# Patient Record
Sex: Female | Born: 1960
Health system: Southern US, Community
[De-identification: ages and names within clinical notes are randomized; demographics above are authoritative.]

## PROBLEM LIST (undated history)

## (undated) DIAGNOSIS — I639 Cerebral infarction, unspecified: Secondary | ICD-10-CM

## (undated) DIAGNOSIS — C801 Malignant (primary) neoplasm, unspecified: Secondary | ICD-10-CM

## (undated) DIAGNOSIS — I890 Lymphedema, not elsewhere classified: Secondary | ICD-10-CM

## (undated) DIAGNOSIS — J40 Bronchitis, not specified as acute or chronic: Secondary | ICD-10-CM

## (undated) DIAGNOSIS — I10 Essential (primary) hypertension: Secondary | ICD-10-CM

## (undated) DIAGNOSIS — K760 Fatty (change of) liver, not elsewhere classified: Secondary | ICD-10-CM

## (undated) DIAGNOSIS — I5032 Chronic diastolic (congestive) heart failure: Secondary | ICD-10-CM

## (undated) HISTORY — PX: REDUCTION MAMMAPLASTY: SUR839

## (undated) HISTORY — PX: ABDOMINOPLASTY/PANNICULECTOMY: SHX5578

## (undated) HISTORY — DX: Malignant (primary) neoplasm, unspecified: C80.1

---

## 1996-09-13 HISTORY — PX: BREAST SURGERY: SHX581

## 2002-09-13 DIAGNOSIS — I639 Cerebral infarction, unspecified: Secondary | ICD-10-CM

## 2002-09-13 HISTORY — DX: Cerebral infarction, unspecified: I63.9

## 2013-08-20 ENCOUNTER — Other Ambulatory Visit: Payer: Self-pay | Admitting: Emergency Medicine

## 2013-08-20 DIAGNOSIS — Z1231 Encounter for screening mammogram for malignant neoplasm of breast: Secondary | ICD-10-CM

## 2013-09-13 HISTORY — PX: MASTECTOMY: SHX3

## 2013-09-26 ENCOUNTER — Ambulatory Visit: Payer: Self-pay

## 2013-09-26 ENCOUNTER — Ambulatory Visit
Admission: RE | Admit: 2013-09-26 | Discharge: 2013-09-26 | Disposition: A | Discharge status: Home / Self Care | Payer: Federal, State, Local not specified - PPO | Source: Ambulatory Visit | Attending: Emergency Medicine | Admitting: Emergency Medicine

## 2013-09-26 DIAGNOSIS — Z1231 Encounter for screening mammogram for malignant neoplasm of breast: Secondary | ICD-10-CM

## 2013-10-02 ENCOUNTER — Other Ambulatory Visit: Payer: Self-pay | Admitting: Emergency Medicine

## 2013-10-02 ENCOUNTER — Ambulatory Visit
Admission: RE | Admit: 2013-10-02 | Discharge: 2013-10-02 | Disposition: A | Discharge status: Home / Self Care | Payer: Federal, State, Local not specified - PPO | Source: Ambulatory Visit | Attending: Emergency Medicine | Admitting: Emergency Medicine

## 2013-10-02 DIAGNOSIS — N6489 Other specified disorders of breast: Secondary | ICD-10-CM

## 2013-10-02 DIAGNOSIS — Z853 Personal history of malignant neoplasm of breast: Secondary | ICD-10-CM

## 2013-10-02 DIAGNOSIS — Z1231 Encounter for screening mammogram for malignant neoplasm of breast: Secondary | ICD-10-CM

## 2013-10-02 DIAGNOSIS — R928 Other abnormal and inconclusive findings on diagnostic imaging of breast: Secondary | ICD-10-CM

## 2013-10-03 ENCOUNTER — Ambulatory Visit (INDEPENDENT_AMBULATORY_CARE_PROVIDER_SITE_OTHER): Payer: Federal, State, Local not specified - PPO

## 2013-10-03 VITALS — BP 155/83 | HR 60 | Resp 12 | Ht 67.0 in | Wt 170.0 lb

## 2013-10-03 DIAGNOSIS — R52 Pain, unspecified: Secondary | ICD-10-CM

## 2013-10-03 DIAGNOSIS — M2010 Hallux valgus (acquired), unspecified foot: Secondary | ICD-10-CM

## 2013-10-03 DIAGNOSIS — M779 Enthesopathy, unspecified: Secondary | ICD-10-CM

## 2013-10-03 NOTE — Progress Notes (Signed)
   Subjective:    Patient ID: Kelsey Mclaughlin, female    DOB: 1961-06-19, 53 y.o.   MRN: [id number]  HPI Comments: BOTH OF FEET AND BUNION ARE HURTING FOR 10 YEARS AND TREATMENT.''  bunion left foot is more severe painful than the right there is some slight crepitus noted on the left side. Patient had bunion pain for more than 10 years has tried changing shoes padding cushioning. Patient is currently wearing a worn pair of [employer]verse ulcer or tendon shoes. Providing little or no support. Patient does speak [place] however has [place] and [place] and we're able to do much of the visit speaking [place]    Review of Systems  All other systems reviewed and are negative.       Objective:   Physical Exam Neurologically epicritic and proprioceptive sensations intact and symmetric bilateral normal plantar response DTRs not elicited. Vascular status is intact with pedal pulses palpable DP and PT +2/4 bilateral Refill time 3 seconds all digits skin temperature warm turgor normal no edema rubor pallor or varicosities noted. Neurologically skin color pigment normal hair growth present but diminished distally nail somewhat criptotic there is prominence of the first MP. Bilateral left more so than right. Orthopedic biomechanical exam there is crepitus on range of motion the left great toe joint. X-rays AP lateral oblique views taken this time reveal elevated I am space proximally 1343 Ibilateral hallux abductus 20-25 bilateral sesamoid position 6-7 bilateral left more so than right some slight asymmetric joint space narrowing was noted. Patient had functional hallux limitus noted on clinical evaluation due to long first metatarsal. Capsulitis first MTP area in symptomology left more so than right       Assessment & Plan:  Assessment this time is HAV deformity bilateral left more so than right with associated capsulitis and possibly early arthritic change. Per patient request my recommendation at  this time patient is a candidate for surgical intervention explained and [place] bunionectomy with pin or screw fixation to the patient we discussed this both [place] and in [place] At this time consent form was reviewed and signed for [employer] with pin or screw fixation all questions asked medication are answered. Literature about surgery and surgery procedure in surgery Center are dispensed for patient for her review of surgery scheduled her convenience first left foot and eventually the right foot after 3 month healing process. Patient understands she'll be in a Cam Walker boot for 6 week 5-6 week duration. Patient will be weightbearing postop with the assistance of a cane if needed surgery done under IV sedation and regional or local anesthetic block. Surgery scheduled at this time  Harriet Masson DPM

## 2013-10-03 NOTE — Patient Instructions (Signed)
Pre-Operative Instructions  Congratulations, you have decided to take an important step to improving your quality of life.  You can be assured that the doctors of [facility] will be with you every step of the way.  1. Plan to be at the surgery center/hospital at least 1 (one) hour prior to your scheduled time unless otherwise directed by the surgical center/hospital staff.  You must have a responsible adult accompany you, remain during the surgery and drive you home.  Make sure you have directions to the surgical center/hospital and know how to get there on time. 2. For hospital based surgery you will need to obtain a history and physical form from your family physician within 1 month prior to the date of surgery- we will give you a form for you primary physician.  3. We make every effort to accommodate the date you request for surgery.  There are however, times where surgery dates or times have to be moved.  We will contact you as soon as possible if a change in schedule is required.   4. No Aspirin/Ibuprofen for one week before surgery.  If you are on aspirin, any non-steroidal anti-inflammatory medications (Mobic, Aleve, Ibuprofen) you should stop taking it 7 days prior to your surgery.  You make take Tylenol  For pain prior to surgery.  5. Medications- If you are taking daily heart and blood pressure medications, seizure, reflux, allergy, asthma, anxiety, pain or diabetes medications, make sure the surgery center/hospital is aware before the day of surgery so they may notify you which medications to take or avoid the day of surgery. 6. No food or drink after midnight the night before surgery unless directed otherwise by surgical center/hospital staff. 7. No alcoholic beverages 24 hours prior to surgery.  No smoking 24 hours prior to or 24 hours after surgery. 8. Wear loose pants or shorts- loose enough to fit over bandages, boots, and casts. 9. No slip on shoes, sneakers are best. 10. Bring  your boot with you to the surgery center/hospital.  Also bring crutches or a walker if your physician has prescribed it for you.  If you do not have this equipment, it will be provided for you after surgery. 11. If you have not been contracted by the surgery center/hospital by the day before your surgery, call to confirm the date and time of your surgery. 12. Leave-time from work may vary depending on the type of surgery you have.  Appropriate arrangements should be made prior to surgery with your employer. 13. Prescriptions will be provided immediately following surgery by your doctor.  Have these filled as soon as possible after surgery and take the medication as directed. 14. Remove nail polish on the operative foot. 15. Wash the night before surgery.  The night before surgery wash the foot and leg well with the antibacterial soap provided and water paying special attention to beneath the toenails and in between the toes.  Rinse thoroughly with water and dry well with a towel.  Perform this wash unless told not to do so by your physician.  Enclosed: 1 Ice pack (please put in freezer the night before surgery)   1 Hibiclens skin cleaner use any liquid antibacterial soap   Pre-op Instructions  If you have any questions regarding the instructions, do not hesitate to call our office.  : [address], [place] [address] [place]: [phone], [place], [place] [address] [phone]  [place]: [place], [place]  [address] [phone]  Dr. Kendell Bane DPM, Dr. Ila Mcgill DPM Dr. Harriet Masson DPM, Dr. Lanelle Bal DPM, Dr. Trudie Buckler DPM

## 2013-10-04 ENCOUNTER — Ambulatory Visit
Admission: RE | Admit: 2013-10-04 | Discharge: 2013-10-04 | Disposition: A | Discharge status: Home / Self Care | Payer: Federal, State, Local not specified - PPO | Source: Ambulatory Visit | Attending: Emergency Medicine | Admitting: Emergency Medicine

## 2013-10-04 DIAGNOSIS — Z853 Personal history of malignant neoplasm of breast: Secondary | ICD-10-CM

## 2013-10-04 DIAGNOSIS — N6489 Other specified disorders of breast: Secondary | ICD-10-CM

## 2013-10-04 DIAGNOSIS — R928 Other abnormal and inconclusive findings on diagnostic imaging of breast: Secondary | ICD-10-CM

## 2013-10-05 ENCOUNTER — Other Ambulatory Visit: Payer: Self-pay | Admitting: Emergency Medicine

## 2013-10-05 DIAGNOSIS — D0510 Intraductal carcinoma in situ of unspecified breast: Secondary | ICD-10-CM

## 2013-10-11 ENCOUNTER — Telehealth: Payer: Self-pay | Admitting: *Deleted

## 2013-10-11 NOTE — Telephone Encounter (Signed)
Cancelled pt surgery, states pt has breast cancer and is treating this 1st.

## 2013-10-12 ENCOUNTER — Encounter (INDEPENDENT_AMBULATORY_CARE_PROVIDER_SITE_OTHER): Payer: Self-pay

## 2013-10-12 ENCOUNTER — Other Ambulatory Visit (INDEPENDENT_AMBULATORY_CARE_PROVIDER_SITE_OTHER): Payer: Self-pay | Admitting: Surgery

## 2013-10-12 ENCOUNTER — Telehealth: Payer: Self-pay | Admitting: *Deleted

## 2013-10-12 ENCOUNTER — Encounter (INDEPENDENT_AMBULATORY_CARE_PROVIDER_SITE_OTHER): Payer: Federal, State, Local not specified - PPO | Admitting: General Surgery

## 2013-10-12 ENCOUNTER — Ambulatory Visit
Admission: RE | Admit: 2013-10-12 | Discharge: 2013-10-12 | Disposition: A | Discharge status: Home / Self Care | Payer: Federal, State, Local not specified - PPO | Source: Ambulatory Visit | Attending: Emergency Medicine | Admitting: Emergency Medicine

## 2013-10-12 ENCOUNTER — Ambulatory Visit (INDEPENDENT_AMBULATORY_CARE_PROVIDER_SITE_OTHER): Payer: Federal, State, Local not specified - PPO | Admitting: Surgery

## 2013-10-12 ENCOUNTER — Encounter (INDEPENDENT_AMBULATORY_CARE_PROVIDER_SITE_OTHER): Payer: Self-pay | Admitting: Surgery

## 2013-10-12 VITALS — BP 110/90 | HR 92 | Temp 97.9°F | Resp 15 | Ht 67.0 in | Wt 177.8 lb

## 2013-10-12 DIAGNOSIS — C50419 Malignant neoplasm of upper-outer quadrant of unspecified female breast: Secondary | ICD-10-CM

## 2013-10-12 DIAGNOSIS — D0510 Intraductal carcinoma in situ of unspecified breast: Secondary | ICD-10-CM

## 2013-10-12 DIAGNOSIS — C50412 Malignant neoplasm of upper-outer quadrant of left female breast: Secondary | ICD-10-CM

## 2013-10-12 DIAGNOSIS — C50912 Malignant neoplasm of unspecified site of left female breast: Secondary | ICD-10-CM

## 2013-10-12 DIAGNOSIS — Z170 Estrogen receptor positive status [ER+]: Secondary | ICD-10-CM | POA: Insufficient documentation

## 2013-10-12 NOTE — Patient Instructions (Signed)
Plastic Surgery referral to discuss possible immediate reconstruction Genetic Counseling to discuss possible genetic testing Oncology referral to discuss possible chemotherapy after surgery

## 2013-10-12 NOTE — Progress Notes (Addendum)
Patient ID: Kelsey Mclaughlin, female   DOB: 09/11/1961, 53 y.o.   MRN: [id number]  Chief Complaint  Patient presents with  . New Evaluation    eval Lt br cancer    HPI Kelsey Mclaughlin is a 53 y.o. female.  Referred by Dr. Ormond, [facility]  for recurrent left breast cancer HPI This is a 53-year-old female who presents with recent diagnosis of recurrent left breast cancer. The patient was originally diagnosed at age 35 with invasive ductal carcinoma in the left upper outer quadrant near the axilla. The patient was in [place] at that time. According to the reports from the patient and her wife, as well as some of the notes that they brought with them from [place], She had a lumpectomy and full axillary lymph node dissection on the left in 1998. She underwent chemotherapy as well as radiation postoperatively. I cannot determine from the notes in [place] what her chemotherapeutic regimen involved. I can determine that she had 2/26 lymph nodes positive on her pathology report. The original tumor was infiltrating ductal carcinoma.  Over the last 4 years she has felt a small lump in the upper-outer quadrant of her left breast. She had mammograms and ultrasounds performed of this area but no biopsies were ever performed. Recently she moved to  To [place]. She underwent screening mammogram in December and was brought back for a diagnostic left mammogram for left breast asymmetry on 10/02/13.  At 1:00 in the left breast about 2 cm from the nipple there is an irregular hypoechoic mass measuring 1 x 0.4 x 1.7 cm. 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 ER and PR are positive Ki-67 15% MRI was performed this morning and is still pending. Past Medical History  Diagnosis Date  . Cancer     breast  left breast - infiltrating ductal with lumpectomy/ ALND (2/26 LN positive) followed by chemotherapy/  radiation - 1998 Hypertension  Past Surgical History  Procedure Laterality Date  . Breast surgery  1998    lumpectomy on left and removed lymphnodes in [place]    Family History  Problem Relation Age of Onset  . Cancer Mother     breast  . Heart disease Father   . Cancer Maternal Grandmother     breast  She has one sister  Social History History  Substance Use Topics  . Smoking status: Never Smoker   . Smokeless tobacco: Never Used  . Alcohol Use: No    No Known Allergies  Current Outpatient Prescriptions  Medication Sig Dispense Refill  . clarithromycin (BIAXIN) 500 MG tablet       . promethazine-dextromethorphan (PROMETHAZINE-DM) 6.25-15 MG/5ML syrup       . telmisartan-hydrochlorothiazide (MICARDIS HCT) 80-12.5 MG per tablet        No current facility-administered medications for this visit.    Review of Systems Review of Systems  Constitutional: Negative for fever, chills and unexpected weight change.  HENT: Negative for congestion, hearing loss, sore throat, trouble swallowing and voice change.   Eyes: Negative for visual disturbance.  Respiratory: Negative for cough and wheezing.   Cardiovascular: Negative for chest pain, palpitations and leg swelling.  Gastrointestinal: Negative for nausea, vomiting, abdominal pain, diarrhea, constipation, blood in stool, abdominal distention and anal bleeding.  Genitourinary: Negative for hematuria, vaginal bleeding and difficulty urinating.  Musculoskeletal: Negative for arthralgias.  Skin: Negative for rash and wound.  Neurological: Negative for seizures,   syncope and headaches.  Hematological: Negative for adenopathy. Does not bruise/bleed easily.  Psychiatric/Behavioral: Negative for confusion.   Menarche - age 84 First pregnancy - age 4 Breastfeeding - yes Menopause - age 53 FH - mother died at age 91 from breast cancer MGM died at age 43 and had breast cancer  Blood pressure 110/90, pulse 92, temperature 97.9  F (36.6 C), temperature source Oral, resp. rate 15, height 5' 7" (1.702 m), weight 177 lb 12.8 oz (80.65 kg).  Physical Exam Physical Exam WDWN in NAD HEENT:  EOMI, sclera anicteric Neck:  No masses, no thyromegaly Lungs:  CTA bilaterally; normal respiratory effort Breasts:  Right breast - no palpable masses; no nipple retraction or discharge; no axillary lymphadenopathy.  She has an area behind the right areola at 2:00 that is mildly tender to palpation.  She indicates that she occasionally gets sharp pain in this area, but no masses are palpated.  Left breast - large scar in left axilla running down onto the breast; firm palpable mass at 2:00 about 2 cm outside the areola; no nipple retraction or discharge CV:  Regular rate and rhythm; no murmurs Abd:  +bowel sounds, soft, non-tender, no masses Ext:  Well-perfused; no edema; left arm with some mild lymphedema Skin:  Warm, dry; no sign of jaundice  Data Reviewed Mm Digital Diagnostic Unilat L  10/04/2013   CLINICAL DATA:  Ultrasound-guided left breast biopsy with clip placement.  EXAM: POST-BIOPSY CLIP PLACEMENT LEFT DIAGNOSTIC MAMMOGRAM  COMPARISON:  Previous exams.  FINDINGS: Films are performed following ultrasound guided biopsy of a left breast hypoechoic mass. Clip lies within the mass adjacent to a dystrophic calcification.  IMPRESSION: Post biopsy clip mammogram show the biopsy, ribbon shaped, clip to be well-positioned.  Final Assessment: Post Procedure Mammograms for Marker Placement   Electronically Signed   By: Lajean Manes M.D.   On: 10/04/2013 16:29   Mm Digital Diagnostic Unilat L  10/02/2013   CLINICAL DATA:  Screening recall for a left breast asymmetry.  EXAM: DIGITAL DIAGNOSTIC  LEFT MAMMOGRAM WITH CAD  ULTRASOUND LEFT BREAST  COMPARISON:  Previous exams.  ACR Breast Density Category c: The breast tissue is heterogeneously dense, which may obscure small masses.  FINDINGS: There are persistent asymmetry/possible masses in the  upper-outer left breast at the approximate 1 o'clock position confirmed on the additional CC and MLO spot compression views. Mammographic images were processed with CAD.  Physical examination of the upper-outer left breast reveals a postsurgical scar extending into the left axilla at the approximate 1 o'clock position. There is a firm nodular area also at 1 o'clock 2-3 cm from the nipple which the patient states has been present for a while although is now mildly tender.  Targeted ultrasound of the left breast was performed demonstrating irregular hypoechoic masses at 1 o'clock 2-3 cm from the nipple, one of which appears to extend towards the nipple and measures 1 x 0.4 x 1.7 cm. No lymphadenopathy is seen in the left axilla.  IMPRESSION: Suspicious left breast mass. While this may be related to postsurgical/posttreatment change (scar/fat necrosis), the findings remain indeterminate and therefore biopsy is recommended.  RECOMMENDATION: Ultrasound-guided biopsy of the mass in the left breast at 1 o'clock is recommended. This is scheduled for 10/04/2013 at 2 p.m.  I have discussed the findings and recommendations with the patient. Results were also provided in writing at the conclusion of the visit.  BI-RADS CATEGORY  4: Suspicious abnormality - biopsy should be considered.  Electronically Signed   By: Everlean Alstrom M.D.   On: 10/02/2013 15:16   US Breast Ltd Uni Left Inc Axilla  10/02/2013   CLINICAL DATA:  Screening recall for a left breast asymmetry.  EXAM: DIGITAL DIAGNOSTIC  LEFT MAMMOGRAM WITH CAD  ULTRASOUND LEFT BREAST  COMPARISON:  Previous exams.  ACR Breast Density Category c: The breast tissue is heterogeneously dense, which may obscure small masses.  FINDINGS: There are persistent asymmetry/possible masses in the upper-outer left breast at the approximate 1 o'clock position confirmed on the additional CC and MLO spot compression views. Mammographic images were processed with CAD.  Physical  examination of the upper-outer left breast reveals a postsurgical scar extending into the left axilla at the approximate 1 o'clock position. There is a firm nodular area also at 1 o'clock 2-3 cm from the nipple which the patient states has been present for a while although is now mildly tender.  Targeted ultrasound of the left breast was performed demonstrating irregular hypoechoic masses at 1 o'clock 2-3 cm from the nipple, one of which appears to extend towards the nipple and measures 1 x 0.4 x 1.7 cm. No lymphadenopathy is seen in the left axilla.  IMPRESSION: Suspicious left breast mass. While this may be related to postsurgical/posttreatment change (scar/fat necrosis), the findings remain indeterminate and therefore biopsy is recommended.  RECOMMENDATION: Ultrasound-guided biopsy of the mass in the left breast at 1 o'clock is recommended. This is scheduled for 10/04/2013 at 2 p.m.  I have discussed the findings and recommendations with the patient. Results were also provided in writing at the conclusion of the visit.  BI-RADS CATEGORY  4: Suspicious abnormality - biopsy should be considered.   Electronically Signed   By: Everlean Alstrom M.D.   On: 10/02/2013 15:16   Mm Screening Breast Tomo Bilateral  10/05/2013   ADDENDUM REPORT: 10/05/2013 12:53  ADDENDUM: ACR breast Density Category b: There are scattered areas of fibroglandular density.   Electronically Signed   By: Everlean Alstrom M.D.   On: 10/05/2013 12:53   10/05/2013   CLINICAL DATA:  Screening.  EXAM: DIGITAL SCREENING BILATERAL MAMMOGRAM WITH 3D TOMO WITH CAD  COMPARISON:  Previous exam(s).  ACR Breast Density Category  FINDINGS: In the left breast, a possible asymmetry warrants further evaluation with spot compression views and possibly ultrasound. In the right breast, no suspicious masses or malignant type calcifications are identified. Images were processed with CAD.  IMPRESSION: Further evaluation is suggested for possible asymmetry in the  left breast.  RECOMMENDATION: Diagnostic mammogram and possibly ultrasound of the left breast. (Code:FI-L-57M)  The patient will be contacted regarding the findings, and additional imaging will be scheduled.  BI-RADS CATEGORY  0: Incomplete. Need additional imaging evaluation and/or prior mammograms for comparison.  Electronically Signed: By: Everlean Alstrom M.D. On: 10/02/2013 14:13   [place] Lt Breast Bx W Loc Dev 1st Lesion Img Bx Spec US Guide  10/05/2013   ADDENDUM REPORT: 10/05/2013 10:27  ADDENDUM: PATHOLOGY ADDENDUM:  Pathology left breast biopsy:  Breast, left, needle core biopsy, mass, 1 o'clock, 2 cm fn  - INVASIVE DUCTAL CARCINOMA WITH EXTRACELLULAR MUCIN.  - DUCTAL CARCINOMA IN SITU.  Pathology concordance with imaging findings: Yes  Recommendation: Surgical consultation regarding treatment plan  At the request of the patient, I spoke with her spouse, [name], by telephone on 10/05/2013 at 10:24. She reports that the patient has done well after the biopsy .  Surgical consultation has been arranged for the patient with Dr.  Priyal Musquiz on 10/12/2013. MRI will be arranged.   Electronically Signed   By: Shon Hale M.D.   On: 10/05/2013 10:27   10/05/2013   CLINICAL DATA:  Hypoechoic mass in the left breast at 1 o'clock, 2 cm from the nipple.  EXAM: ULTRASOUND GUIDED LEFT BREAST CORE NEEDLE BIOPSY WITH VACUUM ASSIST  COMPARISON:  Previous exams.  PROCEDURE: I met with the patient and we discussed the procedure of ultrasound-guided biopsy, including benefits and alternatives. We discussed the high likelihood of a successful procedure. We discussed the risks of the procedure including infection, bleeding, tissue injury, clip migration, and inadequate sampling. Informed written consent was given. The usual time-out protocol was performed immediately prior to the procedure.  Using sterile technique and 2% Lidocaine as local anesthetic, under direct ultrasound visualization, a 14 gauge vacuum-assisteddevice was used to  perform biopsy of the left breast mass using a medial to lateral approach. At the conclusion of the procedure, a ribbon tissue marker clip was deployed into the biopsy cavity. Follow-up 2-view mammogram was performed and dictated separately.  IMPRESSION: Ultrasound-guided biopsy of a left breast mass. No apparent complications.  Electronically Signed: By: Lajean Manes M.D. On: 10/04/2013 15:33   We have copies of the notes from [place], which will be scanned into EPIC.  We will attempt to find somebody to translate these so we can determine her previous treatment regimen.   Assessment    Recurrent left breast cancer - infiltrating ductal with DCIS;  ER/PR +;     Plan    The patient and her wife have discussed this prior to our visit today. She wants to pursue bilateral mastectomies. Certainly, I think that she would definitely need a left mastectomy as she has had previous axillary lymph node dissection and radiation. She would not be a candidate for breast conserving therapy on the left. With her strong family history, I would recommend referral for genetic counseling and testing. They both feel strongly that they want to pursue a right prophylactic mastectomy. We discussed the possibility of immediate reconstruction versus delayed reconstruction. We will refer her to plastic surgery for discussion and decision on the timing of reconstruction. We will present her case at breast cancer conference next week.  We will schedule her surgery after she has seen plastic surgery      Total discussion time - 75 minutes  Delon Revelo K. 10/12/2013, 12:38 PM

## 2013-10-12 NOTE — Telephone Encounter (Signed)
Received referral in the workque for Med Onc and Genetics.  There is no notes on how this pt needs to be scheduled for either appt.  I emailed Annie at [employer] with an update on how to schedule this pt.

## 2013-10-15 ENCOUNTER — Telehealth: Payer: Self-pay | Admitting: *Deleted

## 2013-10-15 NOTE — Telephone Encounter (Signed)
Spoke to [place]- pt spouse.  Gave navigation resources and contact information.  No further needs voiced at this time.

## 2013-10-15 NOTE — Telephone Encounter (Signed)
Called and spoke with pt's spouse [name]) and confirmed 10/16/13 genetic appt.  Unable to schedule Karen's appt - emailed Audie Clear to open template so I can.  Confirmed 10/18/13 med onc appt w/ her as well.  Could not schedule Dr. Laurelyn Sickle appt - emailed Audie Clear to enter it for me.  Gave phone to [employer] (the navigator) and she confirmed they had a bag and the paperwork.  I placed a note in EPIC for FA to verify that she has the Intake form and if they don't have with them for one to be given at time of check in.  Dr. Humphrey Rolls requested for an interpretor to be present for this visit, so I went and change the no to a yes, so one will be here.  Emailed Santiago Glad P. To make her aware.  Emailed Annie at [employer] to make her aware also.  Took paperwork to Med Rec for chart.

## 2013-10-16 ENCOUNTER — Encounter: Payer: Self-pay | Admitting: Genetic Counselor

## 2013-10-16 ENCOUNTER — Other Ambulatory Visit: Payer: Federal, State, Local not specified - PPO

## 2013-10-16 ENCOUNTER — Ambulatory Visit (HOSPITAL_BASED_OUTPATIENT_CLINIC_OR_DEPARTMENT_OTHER): Payer: Federal, State, Local not specified - PPO | Admitting: Genetic Counselor

## 2013-10-16 DIAGNOSIS — C50919 Malignant neoplasm of unspecified site of unspecified female breast: Secondary | ICD-10-CM

## 2013-10-16 DIAGNOSIS — Z853 Personal history of malignant neoplasm of breast: Secondary | ICD-10-CM

## 2013-10-16 DIAGNOSIS — Z803 Family history of malignant neoplasm of breast: Secondary | ICD-10-CM

## 2013-10-16 DIAGNOSIS — IMO0002 Reserved for concepts with insufficient information to code with codable children: Secondary | ICD-10-CM

## 2013-10-16 NOTE — Progress Notes (Signed)
Dr.  Jaymes Graff requested a consultation for genetic counseling and risk assessment for Kelsey Mclaughlin, a 53 y.o. female, for discussion of her personal history of breast cancer and family history of breast cancer.  She presents to clinic today to discuss the possibility of a genetic predisposition to cancer, and to further clarify her risks, as well as her family members' risks for cancer.   HISTORY OF PRESENT ILLNESS: In 1998, at the age of 48, Kelsey Mclaughlin was diagnosed with invasive ductal carcinoma of the left breast. This was treated with lumpectomy, chemotherapy and radiation. In 2015, at the age of 40, she was diagnosed with, we think, a recurrance of this original cancer.  The tumor is triple positive.  Pending her genetic testing will determine the surgery for her cancer.  She has not had a colonoscopy yet. She did not have genetic testing at the time of her original cancer.  The patient is originally from [place] and [place], and all of her previous treatment was in [place].  She does not speak [place].   Past Medical History  Diagnosis Date  . Cancer 1998, 2015    breast    Past Surgical History  Procedure Laterality Date  . Breast surgery  1998    lumpectomy on left and removed lymphnodes in [place]    History   Social History  . Marital Status: Married    Spouse Name: N/A    Number of Children: 2  . Years of Education: N/A   Social History Main Topics  . Smoking status: Never Smoker   . Smokeless tobacco: Never Used  . Alcohol Use: Yes     Comment: 2 drinks/day  . Drug Use: No  . Sexual Activity: None   Other Topics Concern  . None   Social History Narrative  . None    REPRODUCTIVE HISTORY AND PERSONAL RISK ASSESSMENT FACTORS: Menarche was at age 66.   postmenopausal Uterus Intact: yes Ovaries Intact: yes G2P2A0, first live birth at age 20  She has not previously undergone treatment for infertility.   Oral Contraceptive use: 0 years    She has not used HRT in the past.    FAMILY HISTORY:  We obtained a detailed, 4-generation family history.  Significant diagnoses are listed below: Family History  Problem Relation Age of Onset  . Breast cancer Mother 32  . Heart disease Father   . Heart attack Father   . Breast cancer Maternal Grandmother 2  . Brain cancer Maternal Uncle 74    benign brain tumor  . Heart attack Paternal Aunt     Patient's maternal ancestors are of [place] descent, and paternal ancestors are of [place] descent. There is possible reported Ashkenazi Jewish ancestry. There is no known consanguinity.  GENETIC COUNSELING ASSESSMENT: Kelsey Mclaughlin is a 53 y.o. female with a personal history of breast cancer and family history of breast cancer (on the the non Jewish side of the family) which somewhat suggestive of a hereditary cancer syndrome and predisposition to cancer. We, therefore, discussed and recommended the following at today's visit.   DISCUSSION: We reviewed the characteristics, features and inheritance patterns of hereditary cancer syndromes. We also discussed genetic testing, including the appropriate family members to test, the process of testing, insurance coverage and turn-around-time for results. We discussed BRCA mutations as the most common reason why there is an early onset of breast cancer.  We also discussed an increased risk for TP53 mutations in individuals with breast cancer diagnosed  35 and under.   PLAN: After considering the risks, benefits, and limitations, Kelsey Mclaughlin provided informed consent to pursue genetic testing and the blood sample will be sent to [facility] for analysis of the Breast/Ovarian cancer panel. We discussed the implications of a positive, negative and/ or variant of uncertain significance genetic test result. Results should be available within approximately 3 weeks' time, at which point they will be disclosed by telephone to [facility], as will any additional recommendations warranted by these results. Kelsey Mclaughlin will receive a summary of her genetic counseling visit and a copy of her results once available. This information will also be available in Epic. We encouraged Kelsey Mclaughlin to remain in contact with cancer genetics annually so that we can continuously update the family history and inform her of any changes in cancer genetics and testing that may be of benefit for her family. Fonda Mclaughlin's questions were answered to her satisfaction today. Our contact information was provided should additional questions or concerns arise.  The patient was seen for a total of 60 minutes, greater than 50% of which was spent face-to-face counseling.  This note will also be sent to the referring provider via the electronic medical record. The patient will be supplied with a summary of this genetic counseling discussion as well as educational information on the discussed hereditary cancer syndromes following the conclusion of their visit.   Patient was discussed with Dr. Marcy Panning.   _______________________________________________________________________ For Office Staff:  Number of people involved in session: 2 Was an Intern/ student involved with case: yes

## 2013-10-17 ENCOUNTER — Other Ambulatory Visit: Payer: Self-pay | Admitting: *Deleted

## 2013-10-17 DIAGNOSIS — C50412 Malignant neoplasm of upper-outer quadrant of left female breast: Secondary | ICD-10-CM

## 2013-10-18 ENCOUNTER — Encounter: Payer: Self-pay | Admitting: Oncology

## 2013-10-18 ENCOUNTER — Ambulatory Visit: Payer: Federal, State, Local not specified - PPO

## 2013-10-18 ENCOUNTER — Encounter: Payer: Self-pay | Admitting: *Deleted

## 2013-10-18 ENCOUNTER — Ambulatory Visit (HOSPITAL_BASED_OUTPATIENT_CLINIC_OR_DEPARTMENT_OTHER): Payer: Federal, State, Local not specified - PPO | Admitting: Oncology

## 2013-10-18 ENCOUNTER — Other Ambulatory Visit (HOSPITAL_BASED_OUTPATIENT_CLINIC_OR_DEPARTMENT_OTHER): Payer: Federal, State, Local not specified - PPO

## 2013-10-18 VITALS — BP 131/80 | HR 85 | Temp 98.4°F | Resp 18 | Ht 67.0 in | Wt 175.8 lb

## 2013-10-18 DIAGNOSIS — C50412 Malignant neoplasm of upper-outer quadrant of left female breast: Secondary | ICD-10-CM

## 2013-10-18 DIAGNOSIS — C50419 Malignant neoplasm of upper-outer quadrant of unspecified female breast: Secondary | ICD-10-CM

## 2013-10-18 DIAGNOSIS — Z853 Personal history of malignant neoplasm of breast: Secondary | ICD-10-CM

## 2013-10-18 DIAGNOSIS — Z170 Estrogen receptor positive status [ER+]: Secondary | ICD-10-CM

## 2013-10-18 LAB — CBC WITH DIFFERENTIAL/PLATELET
BASO%: 1.2 % (ref 0.0–2.0)
Basophils Absolute: 0.1 10*3/uL (ref 0.0–0.1)
EOS%: 2.1 % (ref 0.0–7.0)
Eosinophils Absolute: 0.1 10*3/uL (ref 0.0–0.5)
HCT: 40.2 % (ref 34.8–46.6)
HGB: 13.3 g/dL (ref 11.6–15.9)
LYMPH#: 1.5 10*3/uL (ref 0.9–3.3)
LYMPH%: 30.6 % (ref 14.0–49.7)
MCH: 30 pg (ref 25.1–34.0)
MCHC: 33 g/dL (ref 31.5–36.0)
MCV: 90.8 fL (ref 79.5–101.0)
MONO#: 0.7 10*3/uL (ref 0.1–0.9)
MONO%: 13.4 % (ref 0.0–14.0)
NEUT%: 52.7 % (ref 38.4–76.8)
NEUTROS ABS: 2.6 10*3/uL (ref 1.5–6.5)
Platelets: 349 10*3/uL (ref 145–400)
RBC: 4.42 10*6/uL (ref 3.70–5.45)
RDW: 12.6 % (ref 11.2–14.5)
WBC: 5 10*3/uL (ref 3.9–10.3)

## 2013-10-18 LAB — COMPREHENSIVE METABOLIC PANEL (CC13)
ALBUMIN: 4.1 g/dL (ref 3.5–5.0)
ALT: 103 U/L — ABNORMAL HIGH (ref 0–55)
AST: 45 U/L — AB (ref 5–34)
Alkaline Phosphatase: 94 U/L (ref 40–150)
Anion Gap: 11 mEq/L (ref 3–11)
BUN: 9.9 mg/dL (ref 7.0–26.0)
CALCIUM: 10 mg/dL (ref 8.4–10.4)
CHLORIDE: 104 meq/L (ref 98–109)
CO2: 28 mEq/L (ref 22–29)
Creatinine: 0.7 mg/dL (ref 0.6–1.1)
GLUCOSE: 87 mg/dL (ref 70–140)
POTASSIUM: 4.2 meq/L (ref 3.5–5.1)
Sodium: 142 mEq/L (ref 136–145)
Total Bilirubin: 0.26 mg/dL (ref 0.20–1.20)
Total Protein: 7.5 g/dL (ref 6.4–8.3)

## 2013-10-18 NOTE — Progress Notes (Signed)
Received chart back from [facility] and placed in Dr. Laurelyn Sickle box.

## 2013-10-18 NOTE — Progress Notes (Signed)
Kelsey Mclaughlin [id number] 12/16/1960 53 y.o. 10/18/2013 4:13 PM  CC Dr. Jaymes Graff Dr. Esau Grew  REASON FOR CONSULTATION:  53 year old female with new diagnosis of breast cancer  STAGE:     REFERRING PHYSICIAN: Dr. Jaymes Graff  HISTORY OF PRESENT ILLNESS:  Kelsey Mclaughlin is a 53 y.o. female.  In 1998 at the age of 15 was diagnosed with left breast cancer in [place]. This was an invasive carcinoma grade  2 stage II. At that time she underwent a lumpectomy with excellent lymph node dissection.  She received 6 months of chemotherapy. [name] She also had radiation therapy to the left breast. 2011 patient noticed a lump in the outside of her left breast. She had ultrasound workup performed and was told it was negative no biopsies were performed. General 2014 after patient moved to the [place] she had a mammogram performed this revealed a possible mass in the outer quadrant of the left breast. Ultrasound showed it to be 1.7 cm. MRI of the breasts performed on January 30 revealed the mass to be 1.5 x 2.2 x 1.5 cm. Because of this she also had a biopsy performed on 10/04/2013. The biopsy revealed an invasive ductal carcinoma with ductal carcinoma in situ grade 2. Prognostic panel was positive for estrogen receptor 100% megestrol receptor +33% proliferation marker Ki-67 15% and the tumor was HER-2/neu positive. She has been seen by Dr. Jaymes Graff. Who has recommended her be seen in medical oncology. She also has been seen by genetics to determine whether she has a genetic predisposition to developing breast cancer. There is significant family history of malignancies. She herself is without any complaints. She is accompanied by her spouse/partner. Her case was discussed at the multidisciplinary breast conference. I have reviewed her pathology and radiology with them.   Past Medical History: Past Medical  History  Diagnosis Date  . Cancer 1998, 2015    breast  Hypertension  Past Surgical History: Past Surgical History  Procedure Laterality Date  . Breast surgery  1998    lumpectomy on left and removed lymphnodes in [place]    Family History: Family History  Problem Relation Age of Onset  . Breast cancer Mother 27  . Heart disease Father   . Heart attack Father   . Breast cancer Maternal Grandmother 78  . Brain cancer Maternal Uncle 74    benign brain tumor  . Heart attack Paternal Aunt     Social History History  Substance Use Topics  . Smoking status: Never Smoker   . Smokeless tobacco: Never Used  . Alcohol Use: Yes     Comment: 2 drinks/day    Allergies: No Known Allergies  Current Medications: Current Outpatient Prescriptions  Medication Sig Dispense Refill  . clarithromycin (BIAXIN) 500 MG tablet       . promethazine-dextromethorphan (PROMETHAZINE-DM) 6.25-15 MG/5ML syrup       . telmisartan-hydrochlorothiazide (MICARDIS HCT) 80-12.5 MG per tablet        No current facility-administered medications for this visit.    OB/GYN History: menarche at 26, menopause at 40, no HRT, G2P2 age at first live birth  Fertility Discussion: n/a Prior History of Cancer: yes  Health Maintenance:  Colonoscopy no Bone Density no Last PAP smear 2013  ECOG PERFORMANCE STATUS: 0 - Asymptomatic  Genetic Counseling/testing: yes, seen by Roma Kayser  REVIEW OF SYSTEMS:  A comprehensive review of systems was negative.  PHYSICAL  EXAMINATION: Blood pressure 131/80, pulse 85, temperature 98.4 F (36.9 C), temperature source Oral, resp. rate 18, height _0  (1.702 m), weight 175 lb 12.8 oz (79.742 kg).  General:  well-nourished in no acute distress.  Eyes:  no scleral icterus.  ENT:  There were no oropharyngeal lesions.  Neck was without thyromegaly.  Lymphatics:  Negative cervical, supraclavicular or axillary adenopathy.  Respiratory: lungs were clear bilaterally  without wheezing or crackles.  Cardiovascular:  Regular rate and rhythm, S1/S2, without murmur, rub or gallop.  There was no pedal edema.  GI:  abdomen was soft, flat, nontender, nondistended, without organomegaly.  Muscoloskeletal:  no spinal tenderness of palpation of vertebral spine.  Skin exam was without echymosis, petichae.  Neuro exam was nonfocal.  Patient was able to get on and off exam table without assistance.  Gait was normal.  Patient was alerted and oriented.  Attention was good.   Language was appropriate.  Mood was normal without depression.  Speech was not pressured.  Thought content was not tangential.   Breasts: right breast normal without mass, skin or nipple changes or axillary nodes, abnormal mass palpable in the upper outer quadrant..   STUDIES/RESULTS: Mr Breast Bilateral W Wo Contrast  10/12/2013   CLINICAL DATA:  Recent diagnosis of left breast cancer. The patient has had a left breast lumpectomy with chemotherapy and radiation in 1998.  EXAM: BILATERAL BREAST MRI WITH AND WITHOUT CONTRAST  LABS:  BUN and creatinine were obtained on site at [facility] at  [address]  Results:  BUN 5 mg/dL,  Creatinine 0.6 mg/dL.  GFR 105  TECHNIQUE: Multiplanar, multisequence MR images of both breasts were obtained prior to and following the intravenous administration of 42m of MultiHance.  THREE-DIMENSIONAL MR IMAGE RENDERING ON INDEPENDENT WORKSTATION:  Three-dimensional MR images were rendered by post-processing of the original MR data on an independent workstation. The three-dimensional MR images were interpreted, and findings are reported in the following complete MRI report for this study. Three dimensional images were evaluated at the independent DynaCad workstation  COMPARISON:  Previous exams  FINDINGS: Breast composition: c:  Heterogeneous fibroglandular tissue  Background parenchymal enhancement: Moderate  Right breast: No mass or abnormal enhancement.  Left breast: In  the anterior lateral upper left breast, there is 1.5 x 2.2 x 1.5 cm washout enhancement with hematoma and biopsy clip consistent with site of known recent biopsy-proven cancer. 1 cm posterior to the recently biopsy site, there is a second 1.4 x 1.2 x 0.8 cm abnormal enhancing mass with irregular margins in washout kinetics in the posterior lateral left breast  Lymph nodes: No abnormal appearing lymph nodes.  Ancillary findings:  None.  IMPRESSION: In the anterior upper-outer quadrant left breast, there is abnormal enhancement with biopsy clip consistent with site of known recent biopsy-proven cancer. There is a second mass 1 cm posterior to the recently biopsy site; this mass is suspicious for cancer.  RECOMMENDATION: This patient is presumed mastectomy candidate as she has already had prior treatment in 1998 for cancer in the same breast. If the patient is undergoing mastectomy of the left breast, treatment plan is recommended.  BI-RADS CATEGORY  6: Known biopsy-proven malignancy - appropriate action should be taken.   Electronically Signed   By: WAbelardo DieselM.D.   On: 10/12/2013 13:03   Dg Foot Complete Left  10/17/2013   X-ray report 3 views left foot AP lateral lateral oblique views are examined.  Overall soft tissues are unremarkable  no cyst or lesions are noted.  Osseous structures reveal relatively normal density slight osteopenia may  be noted there is asymmetric joint space narrowing first MTP area with  sesamoid position 7 I am angle greater than 14 hallux abductus angle  approximately 25. Adductovarus rotation lesser digits 4 and 5 with  contracture of digits 2 and 3 noted as well lateral deviation of the  hallux is noted. Lateral projection reveals inferior retrocalcaneal  spurring and thickening of fascial structures. Anterior break in cyma line  is also noted.  Impression: Rectus foot type with notable bunion deformity/HAV deformity  left foot as well as hammertoe deformity with slightly dorsally   dorsiflexed second toe. No fractures cyst or tumors are otherwise noted  Harriet Masson DPM  Dg Foot Complete Right  10/17/2013   X-ray report 3 views right foot AP lateral and lateral oblique views are examined.  Overall soft tissues unremarkable no cysts or lesions noted. Osseous  structures reveal normal densities there is significant bunion deformity  and HAV deformity noted on AP view sesamoid position 6 IM angle 14 hallux  abductus angle greater than 20 to 25. There is mild digital contracture  second digit right foot lateral projection reveals mild inferior  retrocalcaneal spurring. Anterior break in cyma line. No signs of fracture  or other osseous abnormalities noted  Impression: Rectus foot type slight promontory changes. Mild plantar  fascial thickening noted patient has notable HAV deformity right foot with  possible mild capsulitis first MTP joint slight contracture lesser digits  likely flexible contractures no cyst or tumors noted no fractures noted  next  Harriet Masson DPM  Mm Digital Diagnostic Unilat L  10/04/2013   CLINICAL DATA:  Ultrasound-guided left breast biopsy with clip placement.  EXAM: POST-BIOPSY CLIP PLACEMENT LEFT DIAGNOSTIC MAMMOGRAM  COMPARISON:  Previous exams.  FINDINGS: Films are performed following ultrasound guided biopsy of a left breast hypoechoic mass. Clip lies within the mass adjacent to a dystrophic calcification.  IMPRESSION: Post biopsy clip mammogram show the biopsy, ribbon shaped, clip to be well-positioned.  Final Assessment: Post Procedure Mammograms for Marker Placement   Electronically Signed   By: Lajean Manes M.D.   On: 10/04/2013 16:29   Mm Digital Diagnostic Unilat L  10/02/2013   CLINICAL DATA:  Screening recall for a left breast asymmetry.  EXAM: DIGITAL DIAGNOSTIC  LEFT MAMMOGRAM WITH CAD  ULTRASOUND LEFT BREAST  COMPARISON:  Previous exams.  ACR Breast Density Category c: The breast tissue is heterogeneously dense, which may obscure small  masses.  FINDINGS: There are persistent asymmetry/possible masses in the upper-outer left breast at the approximate 1 o'clock position confirmed on the additional CC and MLO spot compression views. Mammographic images were processed with CAD.  Physical examination of the upper-outer left breast reveals a postsurgical scar extending into the left axilla at the approximate 1 o'clock position. There is a firm nodular area also at 1 o'clock 2-3 cm from the nipple which the patient states has been present for a while although is now mildly tender.  Targeted ultrasound of the left breast was performed demonstrating irregular hypoechoic masses at 1 o'clock 2-3 cm from the nipple, one of which appears to extend towards the nipple and measures 1 x 0.4 x 1.7 cm. No lymphadenopathy is seen in the left axilla.  IMPRESSION: Suspicious left breast mass. While this may be related to postsurgical/posttreatment change (scar/fat necrosis), the findings remain indeterminate and therefore biopsy is recommended.  RECOMMENDATION: Ultrasound-guided biopsy of the  mass in the left breast at 1 o'clock is recommended. This is scheduled for 10/04/2013 at 2 p.m.  I have discussed the findings and recommendations with the patient. Results were also provided in writing at the conclusion of the visit.  BI-RADS CATEGORY  4: Suspicious abnormality - biopsy should be considered.   Electronically Signed   By: Everlean Alstrom M.D.   On: 10/02/2013 15:16   US Breast Ltd Uni Left Inc Axilla  10/02/2013   CLINICAL DATA:  Screening recall for a left breast asymmetry.  EXAM: DIGITAL DIAGNOSTIC  LEFT MAMMOGRAM WITH CAD  ULTRASOUND LEFT BREAST  COMPARISON:  Previous exams.  ACR Breast Density Category c: The breast tissue is heterogeneously dense, which may obscure small masses.  FINDINGS: There are persistent asymmetry/possible masses in the upper-outer left breast at the approximate 1 o'clock position confirmed on the additional CC and MLO spot  compression views. Mammographic images were processed with CAD.  Physical examination of the upper-outer left breast reveals a postsurgical scar extending into the left axilla at the approximate 1 o'clock position. There is a firm nodular area also at 1 o'clock 2-3 cm from the nipple which the patient states has been present for a while although is now mildly tender.  Targeted ultrasound of the left breast was performed demonstrating irregular hypoechoic masses at 1 o'clock 2-3 cm from the nipple, one of which appears to extend towards the nipple and measures 1 x 0.4 x 1.7 cm. No lymphadenopathy is seen in the left axilla.  IMPRESSION: Suspicious left breast mass. While this may be related to postsurgical/posttreatment change (scar/fat necrosis), the findings remain indeterminate and therefore biopsy is recommended.  RECOMMENDATION: Ultrasound-guided biopsy of the mass in the left breast at 1 o'clock is recommended. This is scheduled for 10/04/2013 at 2 p.m.  I have discussed the findings and recommendations with the patient. Results were also provided in writing at the conclusion of the visit.  BI-RADS CATEGORY  4: Suspicious abnormality - biopsy should be considered.   Electronically Signed   By: Everlean Alstrom M.D.   On: 10/02/2013 15:16   Mm Screening Breast Tomo Bilateral  10/05/2013   ADDENDUM REPORT: 10/05/2013 12:53  ADDENDUM: ACR breast Density Category b: There are scattered areas of fibroglandular density.   Electronically Signed   By: Everlean Alstrom M.D.   On: 10/05/2013 12:53   10/05/2013   CLINICAL DATA:  Screening.  EXAM: DIGITAL SCREENING BILATERAL MAMMOGRAM WITH 3D TOMO WITH CAD  COMPARISON:  Previous exam(s).  ACR Breast Density Category  FINDINGS: In the left breast, a possible asymmetry warrants further evaluation with spot compression views and possibly ultrasound. In the right breast, no suspicious masses or malignant type calcifications are identified. Images were processed with CAD.   IMPRESSION: Further evaluation is suggested for possible asymmetry in the left breast.  RECOMMENDATION: Diagnostic mammogram and possibly ultrasound of the left breast. (Code:[id number])  The patient will be contacted regarding the findings, and additional imaging will be scheduled.  BI-RADS CATEGORY  0: Incomplete. Need additional imaging evaluation and/or prior mammograms for comparison.  Electronically Signed: By: Everlean Alstrom M.D. On: 10/02/2013 14:13   [place] Lt Breast Bx W Loc Dev 1st Lesion Img Bx Spec US Guide  10/05/2013   ADDENDUM REPORT: 10/05/2013 10:27  ADDENDUM: PATHOLOGY ADDENDUM:  Pathology left breast biopsy:  Breast, left, needle core biopsy, mass, 1 o'clock, 2 cm fn  - INVASIVE DUCTAL CARCINOMA WITH EXTRACELLULAR MUCIN.  - DUCTAL CARCINOMA IN SITU.  Pathology concordance with imaging findings: Yes  Recommendation: Surgical consultation regarding treatment plan  At the request of the patient, I spoke with her spouse, [name], by telephone on 10/05/2013 at 10:24. She reports that the patient has done well after the biopsy .  Surgical consultation has been arranged for the patient with Dr. Georgette Dover on 10/12/2013. MRI will be arranged.   Electronically Signed   By: Shon Hale M.D.   On: 10/05/2013 10:27   10/05/2013   CLINICAL DATA:  Hypoechoic mass in the left breast at 1 o'clock, 2 cm from the nipple.  EXAM: ULTRASOUND GUIDED LEFT BREAST CORE NEEDLE BIOPSY WITH VACUUM ASSIST  COMPARISON:  Previous exams.  PROCEDURE: I met with the patient and we discussed the procedure of ultrasound-guided biopsy, including benefits and alternatives. We discussed the high likelihood of a successful procedure. We discussed the risks of the procedure including infection, bleeding, tissue injury, clip migration, and inadequate sampling. Informed written consent was given. The usual time-out protocol was performed immediately prior to the procedure.  Using sterile technique and 2% Lidocaine as local anesthetic, under  direct ultrasound visualization, a 14 gauge vacuum-assisteddevice was used to perform biopsy of the left breast mass using a medial to lateral approach. At the conclusion of the procedure, a ribbon tissue marker clip was deployed into the biopsy cavity. Follow-up 2-view mammogram was performed and dictated separately.  IMPRESSION: Ultrasound-guided biopsy of a left breast mass. No apparent complications.  Electronically Signed: By: Lajean Manes M.D. On: 10/04/2013 15:33     LABS:    Chemistry   No results found for this basename: NA, K, CL, CO2, BUN, CREATININE, GLU   No results found for this basename: CALCIUM, ALKPHOS, AST, ALT, BILITOT      Lab Results  Component Value Date   WBC 5.0 10/18/2013   HGB 13.3 10/18/2013   HCT 40.2 10/18/2013   MCV 90.8 10/18/2013   PLT 349 10/18/2013   PATHOLOGY ADDITIONAL INFORMATION: A sample was sent to [facility] for Her 2 analysis by FISH. The results are as follows: Positive. Average HER 2 signals per nucleus: 6.3 Average CEN 17 signals per nucleus: 3.1 Her 2/CEN 17 signal ratio: 2.0 (JBK:ecj 10/15/2013) Enid Cutter MD Pathologist, Electronic Signature ( Signed 10/15/2013) PROGNOSTIC INDICATORS - ACIS Results: IMMUNOHISTOCHEMICAL AND MORPHOMETRIC ANALYSIS BY THE AUTOMATED CELLULAR IMAGING SYSTEM (ACIS) Estrogen Receptor: 100%, POSITIVE, STRONG STAINING INTENSITY Progesterone Receptor: 33%, POSITIVE, STRONG STAINING INTENSITY Proliferation Marker Ki67: 15% REFERENCE RANGE ESTROGEN RECEPTOR NEGATIVE <1% POSITIVE =>1% PROGESTERONE RECEPTOR NEGATIVE <1% POSITIVE =>1% 1 of 3 FINAL for [name], Aralyn ([id number]) ADDITIONAL INFORMATION:(continued) All controls stained appropriately Claudette Laws MD Pathologist, Electronic Signature ( Signed 10/11/2013) FINAL DIAGNOSIS Diagnosis Breast, left, needle core biopsy, mass, 1 o'clock, 2 cm fn - INVASIVE DUCTAL CARCINOMA WITH EXTRACELLULAR MUCIN. - DUCTAL CARCINOMA IN SITU. - SEE  COMMENT. Microscopic Comment The carcinoma appears grade II. A breast prognostic profile will be performed and the results reported separately. The results were called to [facility] on 10/05/13. (JBK:gt, 10/05/13) Enid Cutter MD Pathologist, Electronic Signature (Case signed 10/05/2013) Specimen Gross and Clinical Information Specimen Comment Hypoechoic mass with dystrophic appearing calcifications In formalin 3:02, extracted 6 min Specimen(s) Obtained: Breast, left, needle core biopsy, mass, 1 o'clock, 2 cm fn Specimen Clinical Information Fibroadenoma vs carcinoma Gross Received in formalin are 3 cores of soft, tan-red tissue which measure 1.8 x 0.4 x 0.2 cm and up to 2.3 x 0.2 x 0.2 cm. Time in Formalin  3:02 pm. Submitted in toto in 1 block(s) Disclaimer PR progesterone receptor (PgR 636), immunohistochemi  ASSESSMENT/PLAN    53 year old female with:  #1 prior diagnosis of sounds like a stage II left sided breast cancer in 1998. She was treated with surgery chemotherapy and radiation. She does not recall whether or not she received any kind of endocrine therapy.  #2 patient now with new versus recurrence left breast cancer.  Measuring 2.2 cm, clinical stage II, invasive ductal carcinoma with DCIS estrogen receptor positive progesterone receptor positive HER-2/neu positive with a proliferation marker Ki-67 15%. Patient is interested in having bilateral mastectomies. She does have a strong family history of malignancies. She has been seen by our genetic counselor results of her genetic testing is pending.     #3 We spent the better part of today's hour-long appointment discussing the biology of breast cancer in general, and the specifics of the patient's tumor in particular.Patient and I went over her pathology in detail today. We discussed the significance of the HER-2/neu receptor as well as the estrogen and progesterone receptors. We discussed her treatment  options. Patient is a candidate for chemotherapy along with anti-HER-2 therapy such as Herceptin/perjeta to be given neoadjuvant in her case.we discussed the rationale as well as risks and benefits of chemotherapy as well as anti-HER-2 therapy. We discussed the neoadjuvant antiHer2 approach to treatment with chemotherapy and anti-HER-2 therapy. We discussed use of herceptin/perjeta. We also discussed role of adjuvant antiestrogen therapy. We discussed treatment with chemotherapy consisting of Taxotere and carboplatinum/herceptin/perjeta to be given every 3 weeks for a total of 6 cycles.  Once patient completes chemotherapy she will receive Herceptin every 3 weeks to finish out 1 year of Herceptin. We discussed use of neulasta. We discussed the side effects of chemotherapy and anti-her2neu therapy. Both she and her partner consented to starting the treatment.  #4 we discussed genetic counseling and testing due to patient's family history of breast cancer in her mother as well as her sister. We discussed the rationale for testing as well as implications of testing to her as well as her offspring spent other family members.  #5 we discussed the need for proceeding with a Port-A-Cath placement. We also discussed chemotherapy education class, echocardiogram to monitor patient's heart function throughout the HER-2 therapy. We also discussed role of cardiology in monitoring her heart function  #6 we discussed staging of breast cancer. We will plan on getting PET/CT to evaluate for distant disease.  #6 I will plan to begin her treatment in the first week of March.    Discussion: Patient is being treated per NCCN breast cancer care guidelines appropriate for stage.II   Thank you so much for allowing me to participate in the care of [facility]. I will continue to follow up the patient with you and assist in her care.  All questions were answered. The patient knows to call the clinic with any  problems, questions or concerns. We can certainly see the patient much sooner if necessary.  I spent 60 minutes counseling the patient face to face. The total time spent in the appointment was 60 minutes.  Marcy Panning, MD Medical/Oncology [facility] [phone] (beeper) [phone] (Office)  10/18/2013, 4:13 PM

## 2013-10-18 NOTE — Progress Notes (Signed)
Checked in new pt with no financial concerns. °

## 2013-10-19 ENCOUNTER — Encounter: Payer: Self-pay | Admitting: *Deleted

## 2013-10-19 NOTE — Progress Notes (Signed)
Mailed Care plan and navigator business cards.

## 2013-10-22 ENCOUNTER — Telehealth: Payer: Self-pay | Admitting: Genetic Counselor

## 2013-10-22 ENCOUNTER — Telehealth: Payer: Self-pay | Admitting: Oncology

## 2013-10-22 ENCOUNTER — Encounter: Payer: Self-pay | Admitting: *Deleted

## 2013-10-22 ENCOUNTER — Encounter: Payer: Self-pay | Admitting: Genetic Counselor

## 2013-10-22 ENCOUNTER — Other Ambulatory Visit: Payer: Self-pay | Admitting: *Deleted

## 2013-10-22 MED ORDER — ONDANSETRON HCL 8 MG PO TABS: 8.0000 mg | ORAL_TABLET | Freq: Two times a day (BID) | ORAL | Status: DC

## 2013-10-22 MED ORDER — PROCHLORPERAZINE MALEATE 10 MG PO TABS: 10.0000 mg | ORAL_TABLET | Freq: Four times a day (QID) | ORAL | Status: DC | PRN

## 2013-10-22 MED ORDER — DEXAMETHASONE 4 MG PO TABS: 8.0000 mg | ORAL_TABLET | Freq: Two times a day (BID) | ORAL | Status: DC

## 2013-10-22 MED ORDER — LORAZEPAM 0.5 MG PO TABS: 0.5000 mg | ORAL_TABLET | Freq: Four times a day (QID) | ORAL | Status: DC | PRN

## 2013-10-22 NOTE — Telephone Encounter (Signed)
Phoned in a prescription for Ativan 0.5 mg tablets.

## 2013-10-22 NOTE — Telephone Encounter (Signed)
, °

## 2013-10-22 NOTE — Telephone Encounter (Signed)
Spoke with Kelsey Mclaughlin telling her that Kelsey Mclaughlin was negative for the 3 common mutations found in the [place] population.  The full Breast/Ovarian cancer panel is pending.

## 2013-10-23 ENCOUNTER — Encounter: Payer: Self-pay | Admitting: Nurse Practitioner

## 2013-10-23 ENCOUNTER — Other Ambulatory Visit (INDEPENDENT_AMBULATORY_CARE_PROVIDER_SITE_OTHER): Payer: Self-pay | Admitting: Surgery

## 2013-10-23 ENCOUNTER — Telehealth: Payer: Self-pay | Admitting: *Deleted

## 2013-10-23 ENCOUNTER — Encounter (HOSPITAL_COMMUNITY): Payer: Self-pay | Admitting: Pharmacy Technician

## 2013-10-23 ENCOUNTER — Telehealth: Payer: Self-pay | Admitting: Oncology

## 2013-10-23 ENCOUNTER — Ambulatory Visit: Payer: Self-pay | Admitting: Internal Medicine

## 2013-10-23 NOTE — Telephone Encounter (Signed)
, °

## 2013-10-23 NOTE — Progress Notes (Signed)
Cbc with diff, cmp 10/18/13 with OV DR Orson Slick

## 2013-10-23 NOTE — Patient Instructions (Addendum)
      Your procedure is scheduled on:  10/25/13  THURSDAY  Report to [facility] at  [facility]  Call this number if you have problems the morning of surgery: [phone]         Do not eat food :After Midnight. TONIGHT-- MAY HAVE CLEAR LIQUIDS Thursday MORNING UNTIL 0830AM, THEN NOTHING   Take these medicines the morning of surgery with A SIP OF WATER: NONE   .  Contacts, dentures or partial plates, or metal hairpins  can not be worn to surgery. Your family will be responsible for glasses, dentures, hearing aides while you are in surgery  Leave suitcase in the car. After surgery it may be brought to your room.  For patients admitted to the hospital, checkout time is 11:00 AM day of  discharge.                DO NOT WEAR JEWELRY, LOTIONS, POWDERS, OR PERFUMES.  WOMEN-- DO NOT SHAVE LEGS OR UNDERARMS FOR 48 HOURS BEFORE SHOWERS. MEN MAY SHAVE FACE.  Patients discharged the day of surgery will not be allowed to drive home. IF going home the day of surgery, you must have a driver and someone to stay with you for the first 24 hours  Name and phone number of your driver:    Aggie Hacker [phone]                                                                       [place]  PST [phone]  [phone]                 FAILURE TO [place]                                                  Patient Signature _____________________________

## 2013-10-23 NOTE — Telephone Encounter (Signed)
Per staff message and POF I have scheduled appts.  JMW  

## 2013-10-24 ENCOUNTER — Encounter (HOSPITAL_COMMUNITY)
Admission: RE | Admit: 2013-10-24 | Discharge: 2013-10-24 | Disposition: A | Discharge status: Home / Self Care | Payer: Federal, State, Local not specified - PPO | Source: Ambulatory Visit | Attending: Surgery | Admitting: Surgery

## 2013-10-24 ENCOUNTER — Encounter (HOSPITAL_COMMUNITY): Payer: Self-pay

## 2013-10-24 ENCOUNTER — Ambulatory Visit (HOSPITAL_COMMUNITY)
Admission: RE | Admit: 2013-10-24 | Discharge: 2013-10-24 | Disposition: A | Discharge status: Home / Self Care | Payer: Federal, State, Local not specified - PPO | Source: Ambulatory Visit | Attending: Surgery | Admitting: Surgery

## 2013-10-24 DIAGNOSIS — R9431 Abnormal electrocardiogram [ECG] [EKG]: Secondary | ICD-10-CM | POA: Insufficient documentation

## 2013-10-24 DIAGNOSIS — Z01812 Encounter for preprocedural laboratory examination: Secondary | ICD-10-CM | POA: Insufficient documentation

## 2013-10-24 DIAGNOSIS — Z01818 Encounter for other preprocedural examination: Secondary | ICD-10-CM

## 2013-10-24 DIAGNOSIS — I10 Essential (primary) hypertension: Secondary | ICD-10-CM

## 2013-10-24 HISTORY — DX: Essential (primary) hypertension: I10

## 2013-10-24 HISTORY — DX: Cerebral infarction, unspecified: I63.9

## 2013-10-24 HISTORY — DX: Bronchitis, not specified as acute or chronic: J40

## 2013-10-24 NOTE — Progress Notes (Signed)
PST visit completed with spouse Martin Majestic as interpretor.  Release signed.  [name] will be interpreter day of surgery.   Answered all questions appropriately

## 2013-10-24 NOTE — Progress Notes (Signed)
EKG reviewed by Dr Barbarann Ehlers

## 2013-10-25 ENCOUNTER — Encounter (HOSPITAL_COMMUNITY): Payer: Self-pay | Admitting: *Deleted

## 2013-10-25 ENCOUNTER — Ambulatory Visit (HOSPITAL_COMMUNITY): Payer: Federal, State, Local not specified - PPO

## 2013-10-25 ENCOUNTER — Ambulatory Visit (HOSPITAL_COMMUNITY): Payer: Federal, State, Local not specified - PPO | Admitting: Anesthesiology

## 2013-10-25 ENCOUNTER — Ambulatory Visit: Admit: 2013-10-25 | Payer: Self-pay | Admitting: Surgery

## 2013-10-25 ENCOUNTER — Ambulatory Visit (HOSPITAL_COMMUNITY)
Admission: RE | Admit: 2013-10-25 | Discharge: 2013-10-25 | Disposition: A | Discharge status: Home / Self Care | Payer: Federal, State, Local not specified - PPO | Source: Ambulatory Visit | Attending: Surgery | Admitting: Surgery

## 2013-10-25 ENCOUNTER — Encounter (HOSPITAL_COMMUNITY)
Admission: RE | Disposition: A | Discharge status: Home / Self Care | Payer: Self-pay | Source: Ambulatory Visit | Attending: Surgery

## 2013-10-25 ENCOUNTER — Encounter (HOSPITAL_COMMUNITY): Payer: Federal, State, Local not specified - PPO | Admitting: Anesthesiology

## 2013-10-25 DIAGNOSIS — C50919 Malignant neoplasm of unspecified site of unspecified female breast: Secondary | ICD-10-CM | POA: Insufficient documentation

## 2013-10-25 DIAGNOSIS — Z8673 Personal history of transient ischemic attack (TIA), and cerebral infarction without residual deficits: Secondary | ICD-10-CM | POA: Insufficient documentation

## 2013-10-25 DIAGNOSIS — Z79899 Other long term (current) drug therapy: Secondary | ICD-10-CM | POA: Insufficient documentation

## 2013-10-25 DIAGNOSIS — Z170 Estrogen receptor positive status [ER+]: Secondary | ICD-10-CM | POA: Insufficient documentation

## 2013-10-25 DIAGNOSIS — C50412 Malignant neoplasm of upper-outer quadrant of left female breast: Secondary | ICD-10-CM

## 2013-10-25 DIAGNOSIS — I10 Essential (primary) hypertension: Secondary | ICD-10-CM | POA: Insufficient documentation

## 2013-10-25 HISTORY — PX: PORTACATH PLACEMENT: SHX2246

## 2013-10-25 SURGERY — INSERTION, TUNNELED CENTRAL VENOUS DEVICE, WITH PORT
Anesthesia: General | Site: Chest | Laterality: Right

## 2013-10-25 SURGERY — INSERTION, TUNNELED CENTRAL VENOUS DEVICE, WITH PORT
Anesthesia: General

## 2013-10-25 MED ORDER — HEPARIN SOD (PORK) LOCK FLUSH 100 UNIT/ML IV SOLN
INTRAVENOUS | Status: AC
Start: 2013-10-25 — End: 2013-10-25
  Filled 2013-10-25: qty 5

## 2013-10-25 MED ORDER — PROPOFOL 10 MG/ML IV BOLUS
INTRAVENOUS | Status: AC
Start: 2013-10-25 — End: 2013-10-25
  Filled 2013-10-25: qty 20

## 2013-10-25 MED ORDER — CEFAZOLIN SODIUM-DEXTROSE 2-3 GM-% IV SOLR
2.0000 g | INTRAVENOUS | Status: AC
  Administered 2013-10-25: 2 g via INTRAVENOUS

## 2013-10-25 MED ORDER — PROPOFOL 10 MG/ML IV BOLUS
INTRAVENOUS | Status: DC | PRN
  Administered 2013-10-25: 170 mg via INTRAVENOUS

## 2013-10-25 MED ORDER — LIDOCAINE HCL (CARDIAC) 20 MG/ML IV SOLN
INTRAVENOUS | Status: DC | PRN
  Administered 2013-10-25: 100 mg via INTRAVENOUS

## 2013-10-25 MED ORDER — LIDOCAINE HCL (CARDIAC) 20 MG/ML IV SOLN
INTRAVENOUS | Status: AC
  Filled 2013-10-25: qty 5

## 2013-10-25 MED ORDER — MIDAZOLAM HCL 5 MG/5ML IJ SOLN
INTRAMUSCULAR | Status: DC | PRN
  Administered 2013-10-25: 2 mg via INTRAVENOUS

## 2013-10-25 MED ORDER — HYDROCODONE-ACETAMINOPHEN 5-325 MG PO TABS
1.0000 | ORAL_TABLET | ORAL | Status: DC | PRN
  Administered 2013-10-25: 1 via ORAL
  Filled 2013-10-25: qty 1

## 2013-10-25 MED ORDER — BUPIVACAINE HCL 0.25 % IJ SOLN
INTRAMUSCULAR | Status: DC | PRN
  Administered 2013-10-25: 9 mL

## 2013-10-25 MED ORDER — HEPARIN SOD (PORK) LOCK FLUSH 100 UNIT/ML IV SOLN
INTRAVENOUS | Status: DC | PRN
  Administered 2013-10-25: 500 [IU]

## 2013-10-25 MED ORDER — SODIUM CHLORIDE 0.9 % IR SOLN
Freq: Once | Status: AC
  Administered 2013-10-25: 15:00:00
  Filled 2013-10-25: qty 1.2

## 2013-10-25 MED ORDER — FENTANYL CITRATE 0.05 MG/ML IJ SOLN
INTRAMUSCULAR | Status: AC
  Filled 2013-10-25: qty 2

## 2013-10-25 MED ORDER — LACTATED RINGERS IV SOLN
INTRAVENOUS | Status: DC | PRN
  Administered 2013-10-25: 13:00:00 via INTRAVENOUS

## 2013-10-25 MED ORDER — BUPIVACAINE HCL (PF) 0.25 % IJ SOLN
INTRAMUSCULAR | Status: AC
Start: 2013-10-25 — End: 2013-10-25
  Filled 2013-10-25: qty 30

## 2013-10-25 MED ORDER — CEFAZOLIN SODIUM-DEXTROSE 2-3 GM-% IV SOLR
INTRAVENOUS | Status: AC
Start: 2013-10-25 — End: 2013-10-25
  Filled 2013-10-25: qty 50

## 2013-10-25 MED ORDER — DEXAMETHASONE SODIUM PHOSPHATE 10 MG/ML IJ SOLN
INTRAMUSCULAR | Status: DC | PRN
  Administered 2013-10-25: 10 mg via INTRAVENOUS

## 2013-10-25 MED ORDER — HYDROCODONE-ACETAMINOPHEN 10-325 MG PO TABS: 1.0000 | ORAL_TABLET | Freq: Four times a day (QID) | ORAL | Status: DC | PRN

## 2013-10-25 MED ORDER — DEXAMETHASONE SODIUM PHOSPHATE 10 MG/ML IJ SOLN
INTRAMUSCULAR | Status: AC
Start: 2013-10-25 — End: 2013-10-25
  Filled 2013-10-25: qty 1

## 2013-10-25 MED ORDER — MIDAZOLAM HCL 2 MG/2ML IJ SOLN
INTRAMUSCULAR | Status: AC
  Filled 2013-10-25: qty 2

## 2013-10-25 MED ORDER — LACTATED RINGERS IV SOLN: INTRAVENOUS | Status: DC

## 2013-10-25 MED ORDER — MORPHINE SULFATE 10 MG/ML IJ SOLN: 2.0000 mg | INTRAMUSCULAR | Status: DC | PRN

## 2013-10-25 MED ORDER — FENTANYL CITRATE 0.05 MG/ML IJ SOLN
25.0000 ug | INTRAMUSCULAR | Status: DC | PRN
  Administered 2013-10-25: 50 ug via INTRAVENOUS
  Administered 2013-10-25 (×2): 25 ug via INTRAVENOUS

## 2013-10-25 MED ORDER — FENTANYL CITRATE 0.05 MG/ML IJ SOLN
INTRAMUSCULAR | Status: DC | PRN
  Administered 2013-10-25 (×2): 50 ug via INTRAVENOUS

## 2013-10-25 MED ORDER — ONDANSETRON HCL 4 MG/2ML IJ SOLN
INTRAMUSCULAR | Status: AC
  Filled 2013-10-25: qty 2

## 2013-10-25 MED ORDER — ONDANSETRON HCL 4 MG/2ML IJ SOLN
INTRAMUSCULAR | Status: DC | PRN
  Administered 2013-10-25: 4 mg via INTRAVENOUS

## 2013-10-25 MED ORDER — ONDANSETRON HCL 4 MG/2ML IJ SOLN: 4.0000 mg | INTRAMUSCULAR | Status: DC | PRN

## 2013-10-25 SURGICAL SUPPLY — 45 items
BAG DECANTER FOR FLEXI CONT (MISCELLANEOUS) ×2 IMPLANT
BENZOIN TINCTURE PRP APPL 2/3 (GAUZE/BANDAGES/DRESSINGS) ×2 IMPLANT
BLADE HEX COATED 2.75 (ELECTRODE) ×2 IMPLANT
BLADE SURG 15 STRL LF DISP TIS (BLADE) ×1 IMPLANT
BLADE SURG 15 STRL SS (BLADE) ×1
BLADE SURG SZ11 CARB STEEL (BLADE) ×2 IMPLANT
COVER SURGICAL LIGHT HANDLE (MISCELLANEOUS) ×2 IMPLANT
DRAPE C-ARM 42X120 X-RAY (DRAPES) ×2 IMPLANT
DRAPE LAPAROSCOPIC ABDOMINAL (DRAPES) ×2 IMPLANT
DRAPE UTILITY XL STRL (DRAPES) ×2 IMPLANT
DRSG TEGADERM 2-3/8X2-3/4 SM (GAUZE/BANDAGES/DRESSINGS) IMPLANT
DRSG TEGADERM 4X4.75 (GAUZE/BANDAGES/DRESSINGS) ×2 IMPLANT
ELECT REM PT RETURN 9FT ADLT (ELECTROSURGICAL) ×2
ELECTRODE REM PT RTRN 9FT ADLT (ELECTROSURGICAL) ×1 IMPLANT
GAUZE SPONGE 2X2 8PLY STRL LF (GAUZE/BANDAGES/DRESSINGS) IMPLANT
GAUZE SPONGE 4X4 16PLY XRAY LF (GAUZE/BANDAGES/DRESSINGS) ×2 IMPLANT
GLOVE BIO SURGEON STRL SZ7 (GLOVE) ×2 IMPLANT
GLOVE BIOGEL PI IND STRL 7.0 (GLOVE) ×1 IMPLANT
GLOVE BIOGEL PI IND STRL 7.5 (GLOVE) ×1 IMPLANT
GLOVE BIOGEL PI INDICATOR 7.0 (GLOVE) ×1
GLOVE BIOGEL PI INDICATOR 7.5 (GLOVE) ×1
GOWN STRL REUS W/TWL LRG LVL3 (GOWN DISPOSABLE) ×4 IMPLANT
GOWN STRL REUS W/TWL XL LVL3 (GOWN DISPOSABLE) ×2 IMPLANT
KIT BASIN OR (CUSTOM PROCEDURE TRAY) ×2 IMPLANT
KIT PORT POWER 8FR ISP CVUE (Catheter) ×2 IMPLANT
KIT POWER CATH 8FR (Catheter) IMPLANT
NEEDLE HYPO 25X1 1.5 SAFETY (NEEDLE) ×2 IMPLANT
NS IRRIG 1000ML POUR BTL (IV SOLUTION) ×2 IMPLANT
PACK BASIC VI WITH GOWN DISP (CUSTOM PROCEDURE TRAY) ×2 IMPLANT
PENCIL BUTTON HOLSTER BLD 10FT (ELECTRODE) ×2 IMPLANT
SPONGE GAUZE 2X2 STER 10/PKG (GAUZE/BANDAGES/DRESSINGS)
SPONGE GAUZE 4X4 12PLY (GAUZE/BANDAGES/DRESSINGS) ×2 IMPLANT
STRIP CLOSURE SKIN 1/2X4 (GAUZE/BANDAGES/DRESSINGS) ×2 IMPLANT
SUT MNCRL AB 4-0 PS2 18 (SUTURE) ×4 IMPLANT
SUT PROLENE 2 0 SH DA (SUTURE) ×4 IMPLANT
SUT SILK 2 0 (SUTURE)
SUT SILK 2-0 30XBRD TIE 12 (SUTURE) IMPLANT
SUT VIC AB 3-0 SH 27 (SUTURE) ×1
SUT VIC AB 3-0 SH 27X BRD (SUTURE) ×1 IMPLANT
SUT VIC AB 3-0 SH 27XBRD (SUTURE) IMPLANT
SYR BULB IRRIGATION 50ML (SYRINGE) IMPLANT
SYR CONTROL 10ML LL (SYRINGE) ×2 IMPLANT
SYRINGE 10CC LL (SYRINGE) ×2 IMPLANT
TOWEL OR 17X26 10 PK STRL BLUE (TOWEL DISPOSABLE) ×2 IMPLANT
WATER STERILE IRR 1500ML POUR (IV SOLUTION) IMPLANT

## 2013-10-25 NOTE — Transfer of Care (Signed)
Immediate Anesthesia Transfer of Care Note  Patient: Kelsey Mclaughlin  Procedure(s) Performed: Procedure(s): INSERTION PORT-A-CATH (Right)  Patient Location: PACU  Anesthesia Type:General  Level of Consciousness: sedated  Airway & Oxygen Therapy: Patient Spontanous Breathing and Patient connected to face mask oxygen  Post-op Assessment: Report given to PACU RN and Post -op Vital signs reviewed and stable  Post vital signs: Reviewed and stable  Complications: No apparent anesthesia complications

## 2013-10-25 NOTE — Discharge Instructions (Signed)
    PORT-A-CATH: POST OP INSTRUCTIONS  Always review your discharge instruction sheet given to you by the facility where your surgery was performed.   1. A prescription for pain medication may be given to you upon discharge. Take your pain medication as prescribed, if needed. If narcotic pain medicine is not needed, then you make take acetaminophen (Tylenol) or ibuprofen (Advil) as needed.  2. Take your usually prescribed medications unless otherwise directed. 3. If you need a refill on your pain medication, please contact our office. All narcotic pain medicine now requires a paper prescription.  Phoned in and fax refills are no longer allowed by law.  Prescriptions will not be filled after 5 pm or on weekends.  4. You should follow a light diet for the remainder of the day after your procedure. 5. Most patients will experience some mild swelling and/or bruising in the area of the incision. It may take several days to resolve. 6. It is common to experience some constipation if taking pain medication after surgery. Increasing fluid intake and taking a stool softener (such as Colace) will usually help or prevent this problem from occurring. A mild laxative (Milk of Magnesia or Miralax) should be taken according to package directions if there are no bowel movements after 48 hours.  7. Unless discharge instructions indicate otherwise, you may remove your bandages 48 hours after surgery, and you may shower at that time. You may have steri-strips (small white skin tapes) in place directly over the incision.  These strips should be left on the skin for 7-10 days.  If your surgeon used Dermabond (skin glue) on the incision, you may shower in 24 hours.  The glue will flake off over the next 2-3 weeks.  8. If your port is left accessed at the end of surgery (needle left in port), the dressing cannot get wet and should only by changed by a healthcare professional. When the port is no longer accessed (when the  needle has been removed), follow step 7.   9. ACTIVITIES:  Limit activity involving your arms for the next 72 hours. Do no strenuous exercise or activity for 1 week. You may drive when you are no longer taking prescription pain medication, you can comfortably wear a seatbelt, and you can maneuver your car. 10.You may need to see your doctor in the office for a follow-up appointment.  Please       check with your doctor.  11.When you receive a new Port-a-Cath, you will get a product guide and        ID card.  Please keep them in case you need them.  WHEN TO CALL YOUR DOCTOR ([phone]): 1. Fever over 101.0 2. Chills 3. Continued bleeding from incision 4. Increased redness and tenderness at the site 5. Shortness of breath, difficulty breathing   The clinic staff is available to answer your questions during regular business hours. Please don't hesitate to call and ask to speak to one of the nurses or medical assistants for clinical concerns. If you have a medical emergency, go to the nearest emergency room or call 911.  A surgeon from [facility] is always on call at the hospital.     For further information, please visit [email or web address]      

## 2013-10-25 NOTE — Interval H&P Note (Signed)
History and Physical Interval Note:  3/38/3291 9:16 PM  Kelsey Mclaughlin  has presented today for surgery, with the diagnosis of recurrent left breast cancer  The various methods of treatment have been discussed with the patient and family. After consideration of risks, benefits and other options for treatment, the patient has consented to  Procedure(s): INSERTION PORT-A-CATH (N/A) as a surgical intervention .  The patient's history has been reviewed, patient examined, no change in status, stable for surgery.  I have reviewed the patient's chart and labs.  Questions were answered to the patient's satisfaction.    She will receive chemotherapy prior to any type of surgical treatment for her breast cancer.   Macai Sisneros K.

## 2013-10-25 NOTE — Anesthesia Preprocedure Evaluation (Addendum)
Anesthesia Evaluation  Patient identified by MRN, date of birth, ID band Patient awake    Reviewed: Allergy & Precautions, H&P , NPO status , Patient's Chart, lab work & pertinent test results  Airway Mallampati: II TM Distance: >3 FB Neck ROM: full    Dental no notable dental hx. (+) Teeth Intact, Dental Advisory Given   Pulmonary neg pulmonary ROS,  breath sounds clear to auscultation  Pulmonary exam normal       Cardiovascular Exercise Tolerance: Good hypertension, Pt. on medications Rhythm:regular Rate:Normal     Neuro/Psych CVA 2004 TIACVA, No Residual Symptoms negative psych ROS   GI/Hepatic negative GI ROS, Neg liver ROS,   Endo/Other  negative endocrine ROS  Renal/GU negative Renal ROS  negative genitourinary   Musculoskeletal   Abdominal   Peds  Hematology negative hematology ROS (+)   Anesthesia Other Findings Breast Ca  Reproductive/Obstetrics negative OB ROS                          Anesthesia Physical Anesthesia Plan  ASA: III  Anesthesia Plan: General   Post-op Pain Management:    Induction: Intravenous  Airway Management Planned: LMA  Additional Equipment:   Intra-op Plan:   Post-operative Plan:   Informed Consent: I have reviewed the patients History and Physical, chart, labs and discussed the procedure including the risks, benefits and alternatives for the proposed anesthesia with the patient or authorized representative who has indicated his/her understanding and acceptance.   Dental Advisory Given  Plan Discussed with: CRNA and Surgeon  Anesthesia Plan Comments:         Anesthesia Quick Evaluation

## 2013-10-25 NOTE — H&P (View-Only) (Signed)
Patient ID: Kelsey Mclaughlin, female   DOB: [birth year], 53 y.o.   MRN: [id number]  Chief Complaint  Patient presents with  . New Evaluation    eval Lt br cancer    HPI Kelsey Mclaughlin is a 53 y.o. female.  Referred by Dr. Autumn Patty, [facility] for recurrent left breast cancer HPI This is a 53 year old female who presents with recent diagnosis of recurrent left breast cancer. The patient was originally diagnosed at age 23 with invasive ductal carcinoma in the left upper outer quadrant near the axilla. The patient was in [place] at that time. According to the reports from the patient and her wife, as well as some of the notes that they brought with them from [place], She had a lumpectomy and full axillary lymph node dissection on the left in 1998. She underwent chemotherapy as well as radiation postoperatively. I cannot determine from the notes in [place] what her chemotherapeutic regimen involved. I can determine that she had 2/26 lymph nodes positive on her pathology report. The original tumor was infiltrating ductal carcinoma.  Over the last 4 years she has felt a small lump in the upper-outer quadrant of her left breast. She had mammograms and ultrasounds performed of this area but no biopsies were ever performed. Recently she moved to  To [facility] She underwent screening mammogram in December and was brought back for a diagnostic left mammogram for left breast asymmetry on 10/02/13.  At 1:00 in the left breast about 2 cm from the nipple there is an irregular hypoechoic mass measuring 1 x 0.4 x 1.7 cm. 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 ER and PR are positive Ki-67 15% MRI was performed this morning and is still pending. Past Medical History  Diagnosis Date  . Cancer     breast  left breast - infiltrating ductal with lumpectomy/ ALND (2/26 LN positive) followed by chemotherapy/  radiation - 1998 Hypertension  Past Surgical History  Procedure Laterality Date  . Breast surgery  1998    lumpectomy on left and removed lymphnodes in [place]    Family History  Problem Relation Age of Onset  . Cancer Mother     breast  . Heart disease Father   . Cancer Maternal Grandmother     breast  She has one sister  Social History History  Substance Use Topics  . Smoking status: Never Smoker   . Smokeless tobacco: Never Used  . Alcohol Use: No    No Known Allergies  Current Outpatient Prescriptions  Medication Sig Dispense Refill  . clarithromycin (BIAXIN) 500 MG tablet       . promethazine-dextromethorphan (PROMETHAZINE-DM) 6.25-15 MG/5ML syrup       . telmisartan-hydrochlorothiazide (MICARDIS HCT) 80-12.5 MG per tablet        No current facility-administered medications for this visit.    Review of Systems Review of Systems  Constitutional: Negative for fever, chills and unexpected weight change.  HENT: Negative for congestion, hearing loss, sore throat, trouble swallowing and voice change.   Eyes: Negative for visual disturbance.  Respiratory: Negative for cough and wheezing.   Cardiovascular: Negative for chest pain, palpitations and leg swelling.  Gastrointestinal: Negative for nausea, vomiting, abdominal pain, diarrhea, constipation, blood in stool, abdominal distention and anal bleeding.  Genitourinary: Negative for hematuria, vaginal bleeding and difficulty urinating.  Musculoskeletal: Negative for arthralgias.  Skin: Negative for rash and wound.  Neurological: Negative for seizures,  syncope and headaches.  Hematological: Negative for adenopathy. Does not bruise/bleed easily.  Psychiatric/Behavioral: Negative for confusion.   Menarche - age 84 First pregnancy - age 4 Breastfeeding - yes Menopause - age 50 FH - mother died at age 91 from breast cancer MGM died at age 43 and had breast cancer  Blood pressure 110/90, pulse 92, temperature 97.9  F (36.6 C), temperature source Oral, resp. rate 15, height 5' 7" (1.702 m), weight 177 lb 12.8 oz (80.65 kg).  Physical Exam Physical Exam WDWN in NAD HEENT:  EOMI, sclera anicteric Neck:  No masses, no thyromegaly Lungs:  CTA bilaterally; normal respiratory effort Breasts:  Right breast - no palpable masses; no nipple retraction or discharge; no axillary lymphadenopathy.  She has an area behind the right areola at 2:00 that is mildly tender to palpation.  She indicates that she occasionally gets sharp pain in this area, but no masses are palpated.  Left breast - large scar in left axilla running down onto the breast; firm palpable mass at 2:00 about 2 cm outside the areola; no nipple retraction or discharge CV:  Regular rate and rhythm; no murmurs Abd:  +bowel sounds, soft, non-tender, no masses Ext:  Well-perfused; no edema; left arm with some mild lymphedema Skin:  Warm, dry; no sign of jaundice  Data Reviewed Mm Digital Diagnostic Unilat L  10/04/2013   CLINICAL DATA:  Ultrasound-guided left breast biopsy with clip placement.  EXAM: POST-BIOPSY CLIP PLACEMENT LEFT DIAGNOSTIC MAMMOGRAM  COMPARISON:  Previous exams.  FINDINGS: Films are performed following ultrasound guided biopsy of a left breast hypoechoic mass. Clip lies within the mass adjacent to a dystrophic calcification.  IMPRESSION: Post biopsy clip mammogram show the biopsy, ribbon shaped, clip to be well-positioned.  Final Assessment: Post Procedure Mammograms for Marker Placement   Electronically Signed   By: Lajean Manes M.D.   On: 10/04/2013 16:29   Mm Digital Diagnostic Unilat L  10/02/2013   CLINICAL DATA:  Screening recall for a left breast asymmetry.  EXAM: DIGITAL DIAGNOSTIC  LEFT MAMMOGRAM WITH CAD  ULTRASOUND LEFT BREAST  COMPARISON:  Previous exams.  ACR Breast Density Category c: The breast tissue is heterogeneously dense, which may obscure small masses.  FINDINGS: There are persistent asymmetry/possible masses in the  upper-outer left breast at the approximate 1 o'clock position confirmed on the additional CC and MLO spot compression views. Mammographic images were processed with CAD.  Physical examination of the upper-outer left breast reveals a postsurgical scar extending into the left axilla at the approximate 1 o'clock position. There is a firm nodular area also at 1 o'clock 2-3 cm from the nipple which the patient states has been present for a while although is now mildly tender.  Targeted ultrasound of the left breast was performed demonstrating irregular hypoechoic masses at 1 o'clock 2-3 cm from the nipple, one of which appears to extend towards the nipple and measures 1 x 0.4 x 1.7 cm. No lymphadenopathy is seen in the left axilla.  IMPRESSION: Suspicious left breast mass. While this may be related to postsurgical/posttreatment change (scar/fat necrosis), the findings remain indeterminate and therefore biopsy is recommended.  RECOMMENDATION: Ultrasound-guided biopsy of the mass in the left breast at 1 o'clock is recommended. This is scheduled for 10/04/2013 at 2 p.m.  I have discussed the findings and recommendations with the patient. Results were also provided in writing at the conclusion of the visit.  BI-RADS CATEGORY  4: Suspicious abnormality - biopsy should be considered.  Electronically Signed   By: Everlean Alstrom M.D.   On: 10/02/2013 15:16   US Breast Ltd Uni Left Inc Axilla  10/02/2013   CLINICAL DATA:  Screening recall for a left breast asymmetry.  EXAM: DIGITAL DIAGNOSTIC  LEFT MAMMOGRAM WITH CAD  ULTRASOUND LEFT BREAST  COMPARISON:  Previous exams.  ACR Breast Density Category c: The breast tissue is heterogeneously dense, which may obscure small masses.  FINDINGS: There are persistent asymmetry/possible masses in the upper-outer left breast at the approximate 1 o'clock position confirmed on the additional CC and MLO spot compression views. Mammographic images were processed with CAD.  Physical  examination of the upper-outer left breast reveals a postsurgical scar extending into the left axilla at the approximate 1 o'clock position. There is a firm nodular area also at 1 o'clock 2-3 cm from the nipple which the patient states has been present for a while although is now mildly tender.  Targeted ultrasound of the left breast was performed demonstrating irregular hypoechoic masses at 1 o'clock 2-3 cm from the nipple, one of which appears to extend towards the nipple and measures 1 x 0.4 x 1.7 cm. No lymphadenopathy is seen in the left axilla.  IMPRESSION: Suspicious left breast mass. While this may be related to postsurgical/posttreatment change (scar/fat necrosis), the findings remain indeterminate and therefore biopsy is recommended.  RECOMMENDATION: Ultrasound-guided biopsy of the mass in the left breast at 1 o'clock is recommended. This is scheduled for 10/04/2013 at 2 p.m.  I have discussed the findings and recommendations with the patient. Results were also provided in writing at the conclusion of the visit.  BI-RADS CATEGORY  4: Suspicious abnormality - biopsy should be considered.   Electronically Signed   By: Everlean Alstrom M.D.   On: 10/02/2013 15:16   Mm Screening Breast Tomo Bilateral  10/05/2013   ADDENDUM REPORT: 10/05/2013 12:53  ADDENDUM: ACR breast Density Category b: There are scattered areas of fibroglandular density.   Electronically Signed   By: Everlean Alstrom M.D.   On: 10/05/2013 12:53   10/05/2013   CLINICAL DATA:  Screening.  EXAM: DIGITAL SCREENING BILATERAL MAMMOGRAM WITH 3D TOMO WITH CAD  COMPARISON:  Previous exam(s).  ACR Breast Density Category  FINDINGS: In the left breast, a possible asymmetry warrants further evaluation with spot compression views and possibly ultrasound. In the right breast, no suspicious masses or malignant type calcifications are identified. Images were processed with CAD.  IMPRESSION: Further evaluation is suggested for possible asymmetry in the  left breast.  RECOMMENDATION: Diagnostic mammogram and possibly ultrasound of the left breast. (Code:[id number]-L-[id number])  The patient will be contacted regarding the findings, and additional imaging will be scheduled.  BI-RADS CATEGORY  0: Incomplete. Need additional imaging evaluation and/or prior mammograms for comparison.  Electronically Signed: By: Everlean Alstrom M.D. On: 10/02/2013 14:13   [place] Lt Breast Bx W Loc Dev 1st Lesion Img Bx Spec US Guide  10/05/2013   ADDENDUM REPORT: 10/05/2013 10:27  ADDENDUM: PATHOLOGY ADDENDUM:  Pathology left breast biopsy:  Breast, left, needle core biopsy, mass, 1 o'clock, 2 cm fn  - INVASIVE DUCTAL CARCINOMA WITH EXTRACELLULAR MUCIN.  - DUCTAL CARCINOMA IN SITU.  Pathology concordance with imaging findings: Yes  Recommendation: Surgical consultation regarding treatment plan  At the request of the patient, I spoke with her spouse, [name], by telephone on 10/05/2013 at 10:24. She reports that the patient has done well after the biopsy .  Surgical consultation has been arranged for the patient with Dr.  Michayla Mcneil on 10/12/2013. MRI will be arranged.   Electronically Signed   By: Shon Hale M.D.   On: 10/05/2013 10:27   10/05/2013   CLINICAL DATA:  Hypoechoic mass in the left breast at 1 o'clock, 2 cm from the nipple.  EXAM: ULTRASOUND GUIDED LEFT BREAST CORE NEEDLE BIOPSY WITH VACUUM ASSIST  COMPARISON:  Previous exams.  PROCEDURE: I met with the patient and we discussed the procedure of ultrasound-guided biopsy, including benefits and alternatives. We discussed the high likelihood of a successful procedure. We discussed the risks of the procedure including infection, bleeding, tissue injury, clip migration, and inadequate sampling. Informed written consent was given. The usual time-out protocol was performed immediately prior to the procedure.  Using sterile technique and 2% Lidocaine as local anesthetic, under direct ultrasound visualization, a 14 gauge vacuum-assisteddevice was used to  perform biopsy of the left breast mass using a medial to lateral approach. At the conclusion of the procedure, a ribbon tissue marker clip was deployed into the biopsy cavity. Follow-up 2-view mammogram was performed and dictated separately.  IMPRESSION: Ultrasound-guided biopsy of a left breast mass. No apparent complications.  Electronically Signed: By: Lajean Manes M.D. On: 10/04/2013 15:33   We have copies of the notes from [place], which will be scanned into EPIC.  We will attempt to find somebody to translate these so we can determine her previous treatment regimen.   Assessment    Recurrent left breast cancer - infiltrating ductal with DCIS;  ER/PR +;     Plan    The patient and her wife have discussed this prior to our visit today. She wants to pursue bilateral mastectomies. Certainly, I think that she would definitely need a left mastectomy as she has had previous axillary lymph node dissection and radiation. She would not be a candidate for breast conserving therapy on the left. With her strong family history, I would recommend referral for genetic counseling and testing. They both feel strongly that they want to pursue a right prophylactic mastectomy. We discussed the possibility of immediate reconstruction versus delayed reconstruction. We will refer her to plastic surgery for discussion and decision on the timing of reconstruction. We will present her case at breast cancer conference next week.  We will schedule her surgery after she has seen plastic surgery      Total discussion time - 75 minutes  Ordell Prichett K. 10/12/2013, 12:38 PM

## 2013-10-25 NOTE — Op Note (Signed)
Preop diagnosis: Left breast cancer Postop diagnosis: Same Procedure performed: Right subclavian vein port placement Surgeon:Anya Murphey K. Anesthesia: Gen. Via LMA Indications: This is a 53 year old female who has had previous left breast cancer status post lumpectomy and left axillary lymph node dissection. She has a new left breast cancer. After consultation with oncology, she has decided to proceed with neoadjuvant chemotherapy prior to mastectomy with reconstruction.  Description of procedure: The patient was brought to the operating room and placed in a supine position on the operating room table with a small roll behind her shoulders. After an adequate level of general anesthesia was obtained her right neck and chest were prepped with ChloraPrep and draped in sterile fashion. A timeout was taken to ensure the proper patient and proper procedure. We infiltrated the area below the right clavicle with 0.25% Marcaine with epinephrine. The patient was placed in Trendelenburg position. An 18-gauge needle was used to cannulate the subclavian vein on the first pass. There was good blood return noted in the syringe. The guidewire was passed through the needle and fluoroscopy confirmed that this went down the right side of the mediastinum in the superior vena cava. The needle was removed over the wire. We created a subcutaneous pocket below the insertion site. An 8 French [place] port was brought onto the field. We tunneled this from the subcutaneous pocket to the insertion site. The catheter was attached to the port. The port was secured with 2 separate sutures of 2-0 Prolene. The catheter was flushed. We cut the catheter at 20 cm. The dilator and breakaway sheath were passed over the wire under direct fluoroscopic guidance into the superior vena cava. The dilator and wire were removed. The catheter was then passed through the breakaway sheath which was removed. Fluoroscopy confirmed that the catheter was  in good placement at the caval atrial junction. The catheter was then aspirated with good blood return and then flushed with heparinized saline. We then instilled concentrated heparin solution into the port. The wound was closed with 3-0 Vicryl and 4-0 Monocryl. Steri-Strips and an occlusive dressing were applied. The patient was then extubated and brought to recovery in stable condition. Chest x-ray is pending. All sponge, instrument, and needle counts are correct.  Imogene Burn. Georgette Dover, MD, [facility]  General/ Trauma Surgery  10/25/2013 2:49 PM

## 2013-10-25 NOTE — Anesthesia Postprocedure Evaluation (Signed)
  Anesthesia Post-op Note  Patient: Kelsey Mclaughlin  Procedure(s) Performed: Procedure(s) (LRB): INSERTION PORT-A-CATH (Right)  Patient Location: PACU  Anesthesia Type: General  Level of Consciousness: awake and alert   Airway and Oxygen Therapy: Patient Spontanous Breathing  Post-op Pain: mild  Post-op Assessment: Post-op Vital signs reviewed, Patient's Cardiovascular Status Stable, Respiratory Function Stable, Patent Airway and No signs of Nausea or vomiting  Last Vitals:  Filed Vitals:   10/25/13 1537  BP: 129/73  Pulse: 64  Temp: 36.4 C  Resp:     Post-op Vital Signs: stable   Complications: No apparent anesthesia complications

## 2013-10-25 NOTE — Progress Notes (Signed)
Prescripton given to [name] to take to [facility] to be filled. Purse and plastic bag with pt.

## 2013-10-26 ENCOUNTER — Telehealth: Payer: Self-pay | Admitting: *Deleted

## 2013-10-26 ENCOUNTER — Encounter (HOSPITAL_COMMUNITY): Payer: Self-pay | Admitting: Surgery

## 2013-10-26 NOTE — Telephone Encounter (Signed)
Left vm to f/u with pt from port placement and to ensure she has all appointments.

## 2013-11-01 ENCOUNTER — Encounter (HOSPITAL_COMMUNITY): Payer: Self-pay

## 2013-11-01 ENCOUNTER — Other Ambulatory Visit: Payer: Self-pay | Admitting: *Deleted

## 2013-11-01 ENCOUNTER — Ambulatory Visit (HOSPITAL_COMMUNITY)
Admission: RE | Admit: 2013-11-01 | Discharge: 2013-11-01 | Disposition: A | Discharge status: Home / Self Care | Payer: Federal, State, Local not specified - PPO | Source: Ambulatory Visit | Attending: Oncology | Admitting: Oncology

## 2013-11-01 ENCOUNTER — Other Ambulatory Visit: Payer: Federal, State, Local not specified - PPO

## 2013-11-01 DIAGNOSIS — C50412 Malignant neoplasm of upper-outer quadrant of left female breast: Secondary | ICD-10-CM

## 2013-11-01 DIAGNOSIS — K7689 Other specified diseases of liver: Secondary | ICD-10-CM | POA: Insufficient documentation

## 2013-11-01 DIAGNOSIS — C50919 Malignant neoplasm of unspecified site of unspecified female breast: Secondary | ICD-10-CM | POA: Insufficient documentation

## 2013-11-01 DIAGNOSIS — Z0189 Encounter for other specified special examinations: Secondary | ICD-10-CM

## 2013-11-01 LAB — GLUCOSE, CAPILLARY: Glucose-Capillary: 97 mg/dL (ref 70–99)

## 2013-11-01 MED ORDER — LIDOCAINE-PRILOCAINE 2.5-2.5 % EX CREA: TOPICAL_CREAM | CUTANEOUS | Status: DC

## 2013-11-01 MED ORDER — IOHEXOL 300 MG/ML  SOLN
100.0000 mL | Freq: Once | INTRAMUSCULAR | Status: AC | PRN
  Administered 2013-11-01: 100 mL via INTRAVENOUS

## 2013-11-01 MED ORDER — FLUDEOXYGLUCOSE F - 18 (FDG) INJECTION
9.0000 | Freq: Once | INTRAVENOUS | Status: AC | PRN
  Administered 2013-11-01: 9 via INTRAVENOUS

## 2013-11-01 NOTE — Progress Notes (Signed)
  Echocardiogram 2D Echocardiogram has been performed.  Basilia Jumbo 11/01/2013, 9:39 AM

## 2013-11-05 ENCOUNTER — Telehealth: Payer: Self-pay | Admitting: *Deleted

## 2013-11-05 ENCOUNTER — Encounter: Payer: Self-pay | Admitting: Genetic Counselor

## 2013-11-05 ENCOUNTER — Telehealth: Payer: Self-pay | Admitting: Genetic Counselor

## 2013-11-05 NOTE — Telephone Encounter (Signed)
Message copied by Hebert Soho on Mon Nov 05, 2013  4:52 PM ------      Message from: [name]      Created: Thu Nov 01, 2013  6:27 PM       PET scan no evidence of metastatic disease ------

## 2013-11-05 NOTE — Telephone Encounter (Signed)
Revealed to [name], patient's wife, that genetic testing was negative.  Revealed that there is a likely benign variant found, that we feel does not cause disease.

## 2013-11-05 NOTE — Telephone Encounter (Signed)
As noted below by Dr. Humphrey Rolls, the PET scan shows no evidence of metastatic disease. Patient's family member verbalized understanding.

## 2013-11-12 ENCOUNTER — Ambulatory Visit (HOSPITAL_BASED_OUTPATIENT_CLINIC_OR_DEPARTMENT_OTHER): Payer: Federal, State, Local not specified - PPO

## 2013-11-12 ENCOUNTER — Other Ambulatory Visit (HOSPITAL_BASED_OUTPATIENT_CLINIC_OR_DEPARTMENT_OTHER): Payer: Federal, State, Local not specified - PPO

## 2013-11-12 ENCOUNTER — Encounter (INDEPENDENT_AMBULATORY_CARE_PROVIDER_SITE_OTHER): Payer: Self-pay

## 2013-11-12 ENCOUNTER — Ambulatory Visit (HOSPITAL_BASED_OUTPATIENT_CLINIC_OR_DEPARTMENT_OTHER): Payer: Federal, State, Local not specified - PPO | Admitting: Oncology

## 2013-11-12 ENCOUNTER — Encounter: Payer: Self-pay | Admitting: Oncology

## 2013-11-12 VITALS — BP 119/67 | HR 62 | Temp 97.8°F | Resp 16

## 2013-11-12 VITALS — BP 135/76 | HR 86 | Temp 98.6°F | Resp 18 | Ht 67.0 in | Wt 178.7 lb

## 2013-11-12 DIAGNOSIS — C50412 Malignant neoplasm of upper-outer quadrant of left female breast: Secondary | ICD-10-CM

## 2013-11-12 DIAGNOSIS — C50419 Malignant neoplasm of upper-outer quadrant of unspecified female breast: Secondary | ICD-10-CM

## 2013-11-12 DIAGNOSIS — Z5111 Encounter for antineoplastic chemotherapy: Secondary | ICD-10-CM

## 2013-11-12 DIAGNOSIS — Z853 Personal history of malignant neoplasm of breast: Secondary | ICD-10-CM

## 2013-11-12 DIAGNOSIS — Z170 Estrogen receptor positive status [ER+]: Secondary | ICD-10-CM

## 2013-11-12 DIAGNOSIS — I10 Essential (primary) hypertension: Secondary | ICD-10-CM

## 2013-11-12 DIAGNOSIS — Z5112 Encounter for antineoplastic immunotherapy: Secondary | ICD-10-CM

## 2013-11-12 LAB — COMPREHENSIVE METABOLIC PANEL (CC13)
ALBUMIN: 4.4 g/dL (ref 3.5–5.0)
ALK PHOS: 88 U/L (ref 40–150)
ALT: 128 U/L — ABNORMAL HIGH (ref 0–55)
AST: 52 U/L — ABNORMAL HIGH (ref 5–34)
Anion Gap: 17 mEq/L — ABNORMAL HIGH (ref 3–11)
BUN: 11.8 mg/dL (ref 7.0–26.0)
CALCIUM: 10.5 mg/dL — AB (ref 8.4–10.4)
CHLORIDE: 104 meq/L (ref 98–109)
CO2: 22 mEq/L (ref 22–29)
CREATININE: 0.7 mg/dL (ref 0.6–1.1)
Glucose: 208 mg/dl — ABNORMAL HIGH (ref 70–140)
POTASSIUM: 4 meq/L (ref 3.5–5.1)
Sodium: 142 mEq/L (ref 136–145)
Total Bilirubin: 0.25 mg/dL (ref 0.20–1.20)
Total Protein: 7.8 g/dL (ref 6.4–8.3)

## 2013-11-12 LAB — CBC WITH DIFFERENTIAL/PLATELET
BASO%: 0 % (ref 0.0–2.0)
Basophils Absolute: 0 10*3/uL (ref 0.0–0.1)
EOS ABS: 0 10*3/uL (ref 0.0–0.5)
EOS%: 0 % (ref 0.0–7.0)
HEMATOCRIT: 37.3 % (ref 34.8–46.6)
HGB: 12.4 g/dL (ref 11.6–15.9)
LYMPH%: 4.7 % — AB (ref 14.0–49.7)
MCH: 30.4 pg (ref 25.1–34.0)
MCHC: 33.2 g/dL (ref 31.5–36.0)
MCV: 91.4 fL (ref 79.5–101.0)
MONO#: 0.3 10*3/uL (ref 0.1–0.9)
MONO%: 1.8 % (ref 0.0–14.0)
NEUT#: 13.5 10*3/uL — ABNORMAL HIGH (ref 1.5–6.5)
NEUT%: 93.5 % — ABNORMAL HIGH (ref 38.4–76.8)
Platelets: 323 10*3/uL (ref 145–400)
RBC: 4.08 10*6/uL (ref 3.70–5.45)
RDW: 13.1 % (ref 11.2–14.5)
WBC: 14.5 10*3/uL — AB (ref 3.9–10.3)
lymph#: 0.7 10*3/uL — ABNORMAL LOW (ref 0.9–3.3)

## 2013-11-12 MED ORDER — DEXAMETHASONE SODIUM PHOSPHATE 20 MG/5ML IJ SOLN
INTRAMUSCULAR | Status: AC
  Filled 2013-11-12: qty 5

## 2013-11-12 MED ORDER — ONDANSETRON 16 MG/50ML IVPB (CHCC)
INTRAVENOUS | Status: AC
  Filled 2013-11-12: qty 16

## 2013-11-12 MED ORDER — SODIUM CHLORIDE 0.9 % IJ SOLN
10.0000 mL | INTRAMUSCULAR | Status: DC | PRN
  Administered 2013-11-12: 10 mL
  Filled 2013-11-12: qty 10

## 2013-11-12 MED ORDER — ONDANSETRON 16 MG/50ML IVPB (CHCC)
16.0000 mg | Freq: Once | INTRAVENOUS | Status: AC
  Administered 2013-11-12: 16 mg via INTRAVENOUS

## 2013-11-12 MED ORDER — SODIUM CHLORIDE 0.9 % IV SOLN
75.0000 mg/m2 | Freq: Once | INTRAVENOUS | Status: AC
  Administered 2013-11-12: 150 mg via INTRAVENOUS
  Filled 2013-11-12: qty 15

## 2013-11-12 MED ORDER — DIPHENHYDRAMINE HCL 25 MG PO CAPS
ORAL_CAPSULE | ORAL | Status: AC
  Filled 2013-11-12: qty 2

## 2013-11-12 MED ORDER — SODIUM CHLORIDE 0.9 % IV SOLN
Freq: Once | INTRAVENOUS | Status: AC
  Administered 2013-11-12: 10:00:00 via INTRAVENOUS

## 2013-11-12 MED ORDER — TRASTUZUMAB CHEMO INJECTION 440 MG
8.0000 mg/kg | Freq: Once | INTRAVENOUS | Status: AC
  Administered 2013-11-12: 630 mg via INTRAVENOUS
  Filled 2013-11-12: qty 30

## 2013-11-12 MED ORDER — DEXAMETHASONE SODIUM PHOSPHATE 20 MG/5ML IJ SOLN
20.0000 mg | Freq: Once | INTRAMUSCULAR | Status: AC
  Administered 2013-11-12: 20 mg via INTRAVENOUS

## 2013-11-12 MED ORDER — ACETAMINOPHEN 325 MG PO TABS
650.0000 mg | ORAL_TABLET | Freq: Once | ORAL | Status: AC
  Administered 2013-11-12: 650 mg via ORAL

## 2013-11-12 MED ORDER — DIPHENHYDRAMINE HCL 25 MG PO CAPS
50.0000 mg | ORAL_CAPSULE | Freq: Once | ORAL | Status: AC
  Administered 2013-11-12: 50 mg via ORAL

## 2013-11-12 MED ORDER — SODIUM CHLORIDE 0.9 % IV SOLN
860.0000 mg | Freq: Once | INTRAVENOUS | Status: AC
  Administered 2013-11-12: 860 mg via INTRAVENOUS
  Filled 2013-11-12: qty 86

## 2013-11-12 MED ORDER — HEPARIN SOD (PORK) LOCK FLUSH 100 UNIT/ML IV SOLN
500.0000 [IU] | Freq: Once | INTRAVENOUS | Status: AC | PRN
  Administered 2013-11-12: 500 [IU]
  Filled 2013-11-12: qty 5

## 2013-11-12 MED ORDER — SODIUM CHLORIDE 0.9 % IV SOLN
840.0000 mg | Freq: Once | INTRAVENOUS | Status: AC
  Administered 2013-11-12: 840 mg via INTRAVENOUS
  Filled 2013-11-12: qty 28

## 2013-11-12 MED ORDER — ACETAMINOPHEN 325 MG PO TABS
ORAL_TABLET | ORAL | Status: AC
  Filled 2013-11-12: qty 2

## 2013-11-12 NOTE — Patient Instructions (Signed)
[facility] Discharge Instructions for Patients Receiving Chemotherapy  Today you received the following chemotherapy agents: Taxotere, Carboplatin, Herceptin, Perjeta   To help prevent nausea and vomiting after your treatment, we encourage you to take your nausea medication as prescribed.   If you develop nausea and vomiting that is not controlled by your nausea medication, call the clinic.   BELOW ARE SYMPTOMS THAT SHOULD BE REPORTED IMMEDIATELY:  *FEVER GREATER THAN 100.5 F  *CHILLS WITH OR WITHOUT FEVER  NAUSEA AND VOMITING THAT IS NOT CONTROLLED WITH YOUR NAUSEA MEDICATION  *UNUSUAL SHORTNESS OF BREATH  *UNUSUAL BRUISING OR BLEEDING  TENDERNESS IN MOUTH AND THROAT WITH OR WITHOUT PRESENCE OF ULCERS  *URINARY PROBLEMS  *BOWEL PROBLEMS  UNUSUAL RASH Items with * indicate a potential emergency and should be followed up as soon as possible.  Feel free to call the clinic you have any questions or concerns. The clinic phone number is [phone].

## 2013-11-12 NOTE — Patient Instructions (Signed)
Proceed with chemotherapy today.  I will see you back in one week for followup.

## 2013-11-12 NOTE — Progress Notes (Signed)
OFFICE PROGRESS NOTE  CC Dr. Esau Grew Dr. Jaymes Graff  DIAGNOSIS: 53 year old female with recurrent left breast cancer.  PRIOR THERAPY:  1. In 1998 at the age of 66 was diagnosed with left breast cancer in [place]. This was an invasive carcinoma grade 2 stage II. At that time she underwent a lumpectomy with excellent lymph node dissection. She received 6 months of chemotherapy. [name] She also had radiation therapy to the left breast. 2011 patient noticed a lump in the outside of her left breast. She had ultrasound workup performed and was told it was negative no biopsies were performed. In January 2014 after patient moved to the [place] she had a mammogram performed this revealed a possible mass in the outer quadrant of the left breast. Ultrasound showed it to be 1.7 cm. MRI of the breasts performed on January 30 revealed the mass to be 1.5 x 2.2 x 1.5 cm. Because of this she also had a biopsy performed on 10/04/2013. The biopsy revealed an invasive ductal carcinoma with ductal carcinoma in situ grade 2. Prognostic panel was positive for estrogen receptor 100% progesterone receptor +33% proliferation marker Ki-67 15% and the tumor was HER-2/neu positive.   2. staging scans performed on 11/01/2013 consisting of PET scan and CT scans: CT scan: 1. No acute findings within the chest abdomen or pelvis. There are  no specific features identified to suggest metastatic disease.  2. Hepatic steatosis.  3. Enhancing liver lesion is favored to represent a benign  hemangioma. Attention on followup imaging is advised.  PET scan: No evidence for hypermetabolic metastasis.  3. Echocardiogram on 11/01/13:  - Left ventricle: The cavity size was normal. Wall thickness was normal. The ejection fraction was 56% by biplane speckle tracking. Wall motion was normal; there were no regional wall motion abnormalities. The transmitral  flow pattern was borderline abnormal. Strain imaging, however, indicates normal longitudinal strain (< -20%). - Left atrium: The atrium was normal in size. - Inferior vena cava: The vessel was normal in size; the respirophasic diameter changes were in the normal range (= 50%); findings are consistent with normal central venous pressure.   4. neoadjuvant chemotherapy consisting of Taxotere carboplatinum Herceptin/Perjeta beginning 11/12/13 every 3 weeks x 6 cycles planned.   CURRENT THERAPY: Cycle 1 day 1 neoadjuvant TCH/P. beginning 11/12/2013 with day 2 Neulasta on 11/13/2013  INTERVAL HISTORY: Kelsey Mclaughlin 53 y.o. female returns for followup visit today. Overall she's doing well. We went over her staging scans including PET scan and CT scan. We also reviewed her echocardiogram today. Patient had a Port-A-Cath placed. It is in good position. She has all of her antiemetics. She did take her dexamethasone yesterday. She is recommended to start taking it tomorrow as well for the next 3 days. I instructed and counseled her on how to take the anti-emetics. Patient was seen by genetic counseling Roma Kayser and she did have her testing performed. She was negative for the 3 common mutations found an Ashkenazi Jewish population. The full breast ovarian cancer panel is pending currently. Patient was quite pleased with the results. Today she denies any headaches double vision blurring of vision fevers chills night sweats. No shortness of breath chest pains palpitations. No abdominal pain no diarrhea or constipation. She has no easy bruising or bleeding. She has no myalgias and arthralgias. No peripheral paresthesias or gait disturbances. Remainder of the 10 point review of systems is negative.  MEDICAL HISTORY: Past Medical History  Diagnosis Date  . Cancer 1998, 2015    breast  . Hypertension   . Bronchitis     completed antibiotics 10/21/13/ states resolved  . Stroke 2004    TIA-  no  problems since    ALLERGIES:  has No Known Allergies.  MEDICATIONS:  Current Outpatient Prescriptions  Medication Sig Dispense Refill  . clarithromycin (BIAXIN) 500 MG tablet       . dexamethasone (DECADRON) 4 MG tablet Take 2 tablets (8 mg total) by mouth 2 (two) times daily. Start the day before Taxotere. Then again the day after chemo for 3 days.  30 tablet  1  . HYDROcodone-acetaminophen (NORCO) 10-325 MG per tablet Take 1 tablet by mouth every 6 (six) hours as needed.  30 tablet  0  . lidocaine-prilocaine (EMLA) cream Apply to port-a-cath 1 - 2 hours prior to being accessed.  30 g  3  . LORazepam (ATIVAN) 0.5 MG tablet Take 1 tablet (0.5 mg total) by mouth every 6 (six) hours as needed (Nausea or vomiting).  30 tablet  0  . ondansetron (ZOFRAN) 8 MG tablet Take 1 tablet (8 mg total) by mouth 2 (two) times daily. Start the day after chemo for 3 days. Then take as needed for nausea or vomiting.  30 tablet  1  . prochlorperazine (COMPAZINE) 10 MG tablet Take 1 tablet (10 mg total) by mouth every 6 (six) hours as needed (Nausea or vomiting).  30 tablet  1  . promethazine-dextromethorphan (PROMETHAZINE-DM) 6.25-15 MG/5ML syrup       . telmisartan-hydrochlorothiazide (MICARDIS HCT) 80-12.5 MG per tablet Take 0.5 tablets by mouth daily with lunch.        No current facility-administered medications for this visit.    SURGICAL HISTORY:  Past Surgical History  Procedure Laterality Date  . Breast surgery  1998    lumpectomy on left and removed lymphnodes in [place]  . Portacath placement Right 10/25/2013    Procedure: INSERTION PORT-A-CATH;  Surgeon: Imogene Burn. Georgette Dover, MD;  Location: [facility];  Service: General;  Laterality: Right;    REVIEW OF SYSTEMS:  Pertinent items are noted in HPI.     PHYSICAL EXAMINATION: Blood pressure 135/76, pulse 86, temperature 98.6 F (37 C), temperature source Oral, resp. rate 18, height $RemoveBe'5\' 7"'qHqspdOWC$  (1.702 m), weight 178 lb 11.2 oz (81.058 kg). Body mass index is  27.98 kg/(m^2). ECOG PERFORMANCE STATUS: 0 - Asymptomatic   General appearance: alert, cooperative and appears stated age Neck: no adenopathy, no carotid bruit, no JVD, supple, symmetrical, trachea midline and thyroid not enlarged, symmetric, no tenderness/mass/nodules Lymph nodes: Cervical, supraclavicular, and axillary nodes normal. Resp: clear to auscultation bilaterally Back: symmetric, no curvature. ROM normal. No CVA tenderness. Cardio: regular rate and rhythm GI: soft, non-tender; bowel sounds normal; no masses,  no organomegaly Extremities: extremities normal, atraumatic, no cyanosis or edema Neurologic: Grossly normal Breasts: right breast normal without mass, skin or nipple changes or axillary nodes, left breast with prior surgical scar and palpable new mass in upper outer quadrant, skin or nipple changes or axillary nodes.   LABORATORY DATA: Lab Results  Component Value Date   WBC 14.5* 11/12/2013   HGB 12.4 11/12/2013   HCT 37.3 11/12/2013   MCV 91.4 11/12/2013   PLT 323 11/12/2013      Chemistry      Component Value Date/Time   NA 142 11/12/2013 0822   K 4.0 11/12/2013 0822   CO2 22 11/12/2013 1937  BUN 11.8 11/12/2013 0822   CREATININE 0.7 11/12/2013 0822      Component Value Date/Time   CALCIUM 10.5* 11/12/2013 0822   ALKPHOS 88 11/12/2013 0822   AST 52* 11/12/2013 0822   ALT 128* 11/12/2013 0822   BILITOT 0.25 11/12/2013 0822       RADIOGRAPHIC STUDIES:  Dg Chest 2 View  10/24/2013   CLINICAL DATA:  Hypertension.  EXAM: CHEST  2 VIEW  COMPARISON:  None.  FINDINGS: The heart size and mediastinal contours are within normal limits. Both lungs are clear. Dextroscoliosis of lower thoracic spine is noted.  IMPRESSION: No active cardiopulmonary disease.   Electronically Signed   By: Roque Lias M.D.   On: 10/24/2013 12:27   Ct Chest W Contrast  11/01/2013   CLINICAL DATA:  Breast cancer  EXAM: CT CHEST, ABDOMEN, AND PELVIS WITH CONTRAST  TECHNIQUE: Multidetector CT imaging of the  chest, abdomen and pelvis was performed following the standard protocol during bolus administration of intravenous contrast.  CONTRAST:  OMNIPAQUE IOHEXOL 300 MG/ML  SOLN  COMPARISON:  None.  FINDINGS: CT CHEST FINDINGS  There is no pleural effusion identified. Calcified granulomas identified in the right upper lobe. There is no suspicious pulmonary nodule or mass identified.  Normal heart size. No pericardial effusion. No enlarged mediastinal or hilar lymph nodes identified. There is no axillary or supraclavicular adenopathy noted.  Review of the visualized osseous structures is negative for aggressive lytic or sclerotic bone lesion.  CT ABDOMEN AND PELVIS FINDINGS  There is marked diffuse low attenuation throughout the liver parenchyma compatible with hepatic steatosis. Hyper enhancing structure within the posteromedial right hepatic lobe is identified. This measures 1.2 cm and appears to follow the density of blood pool. On the PET-CT from today there is no corresponding abnormal radiotracer activity . The gallbladder is normal. No biliary dilatation. Normal appearance of the pancreas. The spleen is on unremarkable.  The adrenal glands are both normal. The kidneys are both on unremarkable. Normal appearance of the urinary bladder. The uterus and adnexal structures are unremarkable.  No enlarged lymph nodes within the upper abdomen. No pelvic or inguinal adenopathy identified.  The stomach is normal. The small bowel loops have a normal course and caliber without evidence for bowel obstruction.  Review of the visualized bony structures is negative for aggressive lytic or sclerotic bone lesion.  IMPRESSION: 1. No acute findings within the chest abdomen or pelvis. There are no specific features identified to suggest metastatic disease. 2. Hepatic steatosis. 3. Enhancing liver lesion is favored to represent a benign hemangioma. Attention on followup imaging is advised.   Electronically Signed   By: Signa Kell M.D.   On: 11/01/2013 16:05   Ct Abdomen Pelvis W Contrast  11/01/2013   CLINICAL DATA:  Breast cancer  EXAM: CT CHEST, ABDOMEN, AND PELVIS WITH CONTRAST  TECHNIQUE: Multidetector CT imaging of the chest, abdomen and pelvis was performed following the standard protocol during bolus administration of intravenous contrast.  CONTRAST:  OMNIPAQUE IOHEXOL 300 MG/ML  SOLN  COMPARISON:  None.  FINDINGS: CT CHEST FINDINGS  There is no pleural effusion identified. Calcified granulomas identified in the right upper lobe. There is no suspicious pulmonary nodule or mass identified.  Normal heart size. No pericardial effusion. No enlarged mediastinal or hilar lymph nodes identified. There is no axillary or supraclavicular adenopathy noted.  Review of the visualized osseous structures is negative for aggressive lytic or sclerotic bone lesion.  CT ABDOMEN AND PELVIS  FINDINGS  There is marked diffuse low attenuation throughout the liver parenchyma compatible with hepatic steatosis. Hyper enhancing structure within the posteromedial right hepatic lobe is identified. This measures 1.2 cm and appears to follow the density of blood pool. On the PET-CT from today there is no corresponding abnormal radiotracer activity . The gallbladder is normal. No biliary dilatation. Normal appearance of the pancreas. The spleen is on unremarkable.  The adrenal glands are both normal. The kidneys are both on unremarkable. Normal appearance of the urinary bladder. The uterus and adnexal structures are unremarkable.  No enlarged lymph nodes within the upper abdomen. No pelvic or inguinal adenopathy identified.  The stomach is normal. The small bowel loops have a normal course and caliber without evidence for bowel obstruction.  Review of the visualized bony structures is negative for aggressive lytic or sclerotic bone lesion.  IMPRESSION: 1. No acute findings within the chest abdomen or pelvis. There are no specific features identified  to suggest metastatic disease. 2. Hepatic steatosis. 3. Enhancing liver lesion is favored to represent a benign hemangioma. Attention on followup imaging is advised.   Electronically Signed   By: Kerby Moors M.D.   On: 11/01/2013 16:05   Nm Pet Image Restag (ps) Skull Base To Thigh  11/01/2013   CLINICAL DATA:  Initial treatment strategy for breast cancer.  EXAM: NUCLEAR MEDICINE PET SKULL BASE TO THIGH  FASTING BLOOD GLUCOSE:  Value: 97 mg/dl  TECHNIQUE: 9.0 mCi F-18 FDG was injected intravenously. Full-ring PET imaging was performed from the skull base to thigh after the radiotracer. CT data was obtained and used for attenuation correction and anatomic localization.  COMPARISON:  None.  FINDINGS: NECK  No hypermetabolic lymph nodes in the neck.  CHEST  No hypermetabolic mediastinal or hilar nodes. No suspicious pulmonary nodules on the CT scan. Clip in the left breast and surrounding nodule measure 1.1 cm. The SUV max associated with this nodule is equal to 1.6, image 68.  ABDOMEN/PELVIS  No abnormal hypermetabolic activity within the liver, pancreas, adrenal glands, or spleen. No hypermetabolic lymph nodes in the abdomen or pelvis.  SKELETON  No focal hypermetabolic activity to suggest skeletal metastasis.  IMPRESSION: 1. No evidence for hypermetabolic metastasis.   Electronically Signed   By: Kerby Moors M.D.   On: 11/01/2013 14:00   Dg Chest Port 1 View  10/25/2013   CLINICAL DATA:  Port-A-Cath placement  EXAM: PORTABLE CHEST - 1 VIEW  COMPARISON:  Portable exam 1454 hr compared to 10/24/2013  FINDINGS: Right subclavian power port with tip projecting over right atrium.  Enlargement of cardiac silhouette with slight pulmonary vascular congestion.  Mediastinal contours normal.  Basilar atelectasis without infiltrate, pleural effusion or pneumothorax.  IMPRESSION: No pneumothorax following right subclavian Port-A-Cath placement.  Tip Port-A-Cath catheter projects over right atrium.  Enlargement of  cardiac silhouette with right basilar atelectasis.   Electronically Signed   By: Lavonia Dana M.D.   On: 10/25/2013 15:05   Dg C-arm 1-60 Min-no Report  10/25/2013   CLINICAL DATA: port-a-cath placement   C-ARM 1-60 MINUTES  Fluoroscopy was utilized by the requesting physician.  No radiographic  interpretation.     ASSESSMENT/PLAN: 53 year old female with  #1 stage II (T2 NX) invasive ductal carcinoma of the left breast possibly recurrence. Patient has had prior history of ipsilateral breast cancer originally treated in [place]. The new primary is ER positive PR positive HER-2 positive. Patient has had staging studies performed which were negative for any evidence of  metastatic disease distally.  #2. Chemotherapy: Neoadjuvant consisting of Taxotere carboplatinum Herceptin/perjet here for cycle 1 day 1 today. Her blood work looks terrific she will proceed with her treatment today. She will receive day 2 Neulasta.  #3 patient had echocardiogram performed it did reveal ejection fraction of 56%. We will proceed with the treatment. Patient will also be seen by cardiology. There is a referral already made.  #4 antiemetics: Patient has all of her prescriptions. She is instructed on how to take these.  #5 FE N.: We discussed nutrition and hydration as being a very important part of therapy. She knows she needs to continue to hydrate herself on a daily basis.  #6 physical activity: We discussed continuing to exercise at least 10-20 minutes a day. She certainly can walk on a treadmill on a daily basis.  #7 followup: Patient will be seen back in one week for blood work and evaluation.  #8 hypertension: We will continue to monitor this. She does understand she should take her medications faithfully, also avoid salty foods and processed foods.     All questions were answered. The patient knows to call the clinic with any problems, questions or concerns. We can certainly see the patient much sooner if  necessary.  I spent 30 minutes counseling the patient face to face. The total time spent in the appointment was 30 minutes.    Marcy Panning, MD Medical/Oncology [facility] [phone] (beeper) [phone] (Office)  11/12/2013, 9:20 AM

## 2013-11-13 ENCOUNTER — Ambulatory Visit (HOSPITAL_BASED_OUTPATIENT_CLINIC_OR_DEPARTMENT_OTHER): Payer: Federal, State, Local not specified - PPO

## 2013-11-13 ENCOUNTER — Telehealth: Payer: Self-pay | Admitting: *Deleted

## 2013-11-13 VITALS — BP 109/77 | HR 58 | Temp 98.3°F

## 2013-11-13 DIAGNOSIS — Z5189 Encounter for other specified aftercare: Secondary | ICD-10-CM

## 2013-11-13 DIAGNOSIS — C50419 Malignant neoplasm of upper-outer quadrant of unspecified female breast: Secondary | ICD-10-CM

## 2013-11-13 DIAGNOSIS — C50412 Malignant neoplasm of upper-outer quadrant of left female breast: Secondary | ICD-10-CM

## 2013-11-13 MED ORDER — PEGFILGRASTIM INJECTION 6 MG/0.6ML
6.0000 mg | Freq: Once | SUBCUTANEOUS | Status: AC
  Administered 2013-11-13: 6 mg via SUBCUTANEOUS
  Filled 2013-11-13: qty 0.6

## 2013-11-13 NOTE — Patient Instructions (Signed)

## 2013-11-13 NOTE — Telephone Encounter (Signed)
Patient in for first Neulasta injection today. Patient is with Optometrist. Patient states that it is okay to give printed material in [place]. Patient's first chemotherapy treatment was on 11/12/2013. Patient states, "I am wonderful." Denies any pain or discomfort. Patient also denies any issues with her appetite or bowel and bladder. Neulasta information printed and given to Patient along with After Visit Summary. Encouraged Patient to call the [facility] with any questions or concerns, or if temperature is greater than 100.5. Patient verbalized understanding.

## 2013-11-13 NOTE — Progress Notes (Signed)
Patient in for first Neulasta injection today. Patient is with [facility]. Patient states that it is okay to give printed material in English. Patient's first chemotherapy treatment was on 11/12/2013. Patient states, "I am wonderful." Denies any pain or discomfort. Patient also denies any issues with her appetite or bowel and bladder. Neulasta information printed and given to Patient along with After Visit Summary. Encouraged Patient to call the Cancer Center with any questions or concerns, or if temperature is greater than 100.5. Patient verbalized understanding. 

## 2013-11-19 ENCOUNTER — Other Ambulatory Visit (HOSPITAL_BASED_OUTPATIENT_CLINIC_OR_DEPARTMENT_OTHER): Payer: Federal, State, Local not specified - PPO

## 2013-11-19 ENCOUNTER — Encounter: Payer: Self-pay | Admitting: Oncology

## 2013-11-19 ENCOUNTER — Telehealth: Payer: Self-pay | Admitting: *Deleted

## 2013-11-19 ENCOUNTER — Ambulatory Visit (HOSPITAL_BASED_OUTPATIENT_CLINIC_OR_DEPARTMENT_OTHER): Payer: Federal, State, Local not specified - PPO | Admitting: Oncology

## 2013-11-19 VITALS — BP 129/73 | HR 90 | Temp 98.2°F | Resp 20 | Ht 67.0 in | Wt 175.3 lb

## 2013-11-19 DIAGNOSIS — C50412 Malignant neoplasm of upper-outer quadrant of left female breast: Secondary | ICD-10-CM

## 2013-11-19 DIAGNOSIS — C50419 Malignant neoplasm of upper-outer quadrant of unspecified female breast: Secondary | ICD-10-CM

## 2013-11-19 DIAGNOSIS — Z170 Estrogen receptor positive status [ER+]: Secondary | ICD-10-CM

## 2013-11-19 DIAGNOSIS — K7689 Other specified diseases of liver: Secondary | ICD-10-CM

## 2013-11-19 LAB — COMPREHENSIVE METABOLIC PANEL (CC13)
ALK PHOS: 101 U/L (ref 40–150)
ALT: 134 U/L — AB (ref 0–55)
AST: 36 U/L — AB (ref 5–34)
Albumin: 3.8 g/dL (ref 3.5–5.0)
Anion Gap: 11 mEq/L (ref 3–11)
BUN: 9.8 mg/dL (ref 7.0–26.0)
CALCIUM: 9.4 mg/dL (ref 8.4–10.4)
CHLORIDE: 103 meq/L (ref 98–109)
CO2: 27 mEq/L (ref 22–29)
CREATININE: 0.7 mg/dL (ref 0.6–1.1)
Glucose: 127 mg/dl (ref 70–140)
Potassium: 3.8 mEq/L (ref 3.5–5.1)
Sodium: 141 mEq/L (ref 136–145)
Total Bilirubin: 0.25 mg/dL (ref 0.20–1.20)
Total Protein: 6.7 g/dL (ref 6.4–8.3)

## 2013-11-19 LAB — CBC WITH DIFFERENTIAL/PLATELET
BASO%: 0.2 % (ref 0.0–2.0)
Basophils Absolute: 0 10*3/uL (ref 0.0–0.1)
EOS%: 0.7 % (ref 0.0–7.0)
Eosinophils Absolute: 0.1 10*3/uL (ref 0.0–0.5)
HEMATOCRIT: 37.9 % (ref 34.8–46.6)
HGB: 12.4 g/dL (ref 11.6–15.9)
LYMPH%: 12.7 % — ABNORMAL LOW (ref 14.0–49.7)
MCH: 29.8 pg (ref 25.1–34.0)
MCHC: 32.8 g/dL (ref 31.5–36.0)
MCV: 91 fL (ref 79.5–101.0)
MONO#: 1.1 10*3/uL — ABNORMAL HIGH (ref 0.1–0.9)
MONO%: 8.9 % (ref 0.0–14.0)
NEUT#: 9.5 10*3/uL — ABNORMAL HIGH (ref 1.5–6.5)
NEUT%: 77.5 % — ABNORMAL HIGH (ref 38.4–76.8)
Platelets: 151 10*3/uL (ref 145–400)
RBC: 4.17 10*6/uL (ref 3.70–5.45)
RDW: 13.1 % (ref 11.2–14.5)
WBC: 12.3 10*3/uL — ABNORMAL HIGH (ref 3.9–10.3)
lymph#: 1.6 10*3/uL (ref 0.9–3.3)

## 2013-11-19 NOTE — Telephone Encounter (Signed)
Call to patient on 11-16-13 to adjust appointment time due to provider schedule conflict.  Patient's spouse, [name], called back to report that they desire to cancel appointment with Dr Charlies Constable.  Routing to provider for final review. Patient agreeable to disposition. Will close encounter

## 2013-11-19 NOTE — Progress Notes (Signed)
OFFICE PROGRESS NOTE  CC Dr. Esau Grew Dr. Jaymes Graff  DIAGNOSIS: 53 year old female with recurrent left breast cancer.   Breast cancer of upper-outer quadrant of left female breast   Primary site: Breast (Left)   Staging method: AJCC 7th Edition   Clinical: Stage IIA (T2, NX, cM0) signed by [name], MD on 11/19/2013  5:08 PM   Summary: Stage IIA (T2, NX, cM0)  PRIOR THERAPY:  1. In 1998 at the age of 43 was diagnosed with left breast cancer in [place]. This was an invasive carcinoma grade 2 stage II. At that time she underwent a lumpectomy with excellent lymph node dissection. She received 6 months of chemotherapy. [name] She also had radiation therapy to the left breast. 2011 patient noticed a lump in the outside of her left breast. She had ultrasound workup performed and was told it was negative no biopsies were performed. In January 2014 after patient moved to the [place] she had a mammogram performed this revealed a possible mass in the outer quadrant of the left breast. Ultrasound showed it to be 1.7 cm. MRI of the breasts performed on January 30 revealed the mass to be 1.5 x 2.2 x 1.5 cm. Because of this she also had a biopsy performed on 10/04/2013. The biopsy revealed an invasive ductal carcinoma with ductal carcinoma in situ grade 2. Prognostic panel was positive for estrogen receptor 100% progesterone receptor +33% proliferation marker Ki-67 15% and the tumor was HER-2/neu positive.   2. staging scans performed on 11/01/2013 consisting of PET scan and CT scans: CT scan: 1. No acute findings within the chest abdomen or pelvis. There are  no specific features identified to suggest metastatic disease.  2. Hepatic steatosis.  3. Enhancing liver lesion is favored to represent a benign  hemangioma. Attention on followup imaging is advised.  PET scan: No evidence for hypermetabolic metastasis.  3.  Echocardiogram on 11/01/13:  - Left ventricle: The cavity size was normal. Wall thickness was normal. The ejection fraction was 56% by biplane speckle tracking. Wall motion was normal; there were no regional wall motion abnormalities. The transmitral flow pattern was borderline abnormal. Strain imaging, however, indicates normal longitudinal strain (< -20%). - Left atrium: The atrium was normal in size. - Inferior vena cava: The vessel was normal in size; the respirophasic diameter changes were in the normal range (= 50%); findings are consistent with normal central venous pressure.   4. neoadjuvant chemotherapy consisting of Taxotere carboplatinum Herceptin/Perjeta beginning 11/12/13 every 3 weeks x 6 cycles planned.   CURRENT THERAPY: S/P Cycle 1 day 8 neoadjuvant TCH/P. beginning 11/12/2013 with day 2 Neulasta on 11/13/2013  INTERVAL HISTORY: Kelsey Mclaughlin 53 y.o. female returns for followup visit today. She is here for evaluation post cycle 1 of TCH/PI she received on 11/12/2013. Her primary complaint was development of diarrhea. She did take loperamide and the diarrhea did improve. She also apparently has some dizziness with low blood pressure. Her partner thinks that she was most likely dehydrated. Fluids were pushed. Today patient feels well she has no dizziness. Her blood counts look terrific today.Today she denies any headaches double vision blurring of vision fevers chills night sweats. No shortness of breath chest pains palpitations. No abdominal pain no diarrhea or constipation. She has no easy bruising or bleeding. She has no myalgias and arthralgias. No peripheral paresthesias or gait disturbances. Remainder of the 10 point review of  systems is negative.   MEDICAL HISTORY: Past Medical History  Diagnosis Date  . Cancer 1998, 2015    breast  . Hypertension   . Bronchitis     completed antibiotics 10/21/13/ states resolved  . Stroke 2004    TIA-  no problems since     ALLERGIES:  has No Known Allergies.  MEDICATIONS:  Current Outpatient Prescriptions  Medication Sig Dispense Refill  . clarithromycin (BIAXIN) 500 MG tablet       . dexamethasone (DECADRON) 4 MG tablet Take 2 tablets (8 mg total) by mouth 2 (two) times daily. Start the day before Taxotere. Then again the day after chemo for 3 days.  30 tablet  1  . HYDROcodone-acetaminophen (NORCO) 10-325 MG per tablet Take 1 tablet by mouth every 6 (six) hours as needed.  30 tablet  0  . lidocaine-prilocaine (EMLA) cream Apply to port-a-cath 1 - 2 hours prior to being accessed.  30 g  3  . LORazepam (ATIVAN) 0.5 MG tablet Take 1 tablet (0.5 mg total) by mouth every 6 (six) hours as needed (Nausea or vomiting).  30 tablet  0  . ondansetron (ZOFRAN) 8 MG tablet Take 1 tablet (8 mg total) by mouth 2 (two) times daily. Start the day after chemo for 3 days. Then take as needed for nausea or vomiting.  30 tablet  1  . prochlorperazine (COMPAZINE) 10 MG tablet Take 1 tablet (10 mg total) by mouth every 6 (six) hours as needed (Nausea or vomiting).  30 tablet  1  . promethazine-dextromethorphan (PROMETHAZINE-DM) 6.25-15 MG/5ML syrup       . telmisartan-hydrochlorothiazide (MICARDIS HCT) 80-12.5 MG per tablet Take 0.5 tablets by mouth daily with lunch.        No current facility-administered medications for this visit.    SURGICAL HISTORY:  Past Surgical History  Procedure Laterality Date  . Breast surgery  1998    lumpectomy on left and removed lymphnodes in [place]  . Portacath placement Right 10/25/2013    Procedure: INSERTION PORT-A-CATH;  Surgeon: Imogene Burn. Georgette Dover, MD;  Location: [facility];  Service: General;  Laterality: Right;    REVIEW OF SYSTEMS:  Pertinent items are noted in HPI.     PHYSICAL EXAMINATION: Blood pressure 129/73, pulse 90, temperature 98.2 F (36.8 C), temperature source Oral, resp. rate 20, height '5\' 7"'  (1.702 m), weight 175 lb 4.8 oz (79.516 kg). Body mass index is 27.45  kg/(m^2). ECOG PERFORMANCE STATUS: 0 - Asymptomatic   General appearance: alert, cooperative and appears stated age Neck: no adenopathy, no carotid bruit, no JVD, supple, symmetrical, trachea midline and thyroid not enlarged, symmetric, no tenderness/mass/nodules Lymph nodes: Cervical, supraclavicular, and axillary nodes normal. Resp: clear to auscultation bilaterally Back: symmetric, no curvature. ROM normal. No CVA tenderness. Cardio: regular rate and rhythm GI: soft, non-tender; bowel sounds normal; no masses,  no organomegaly Extremities: extremities normal, atraumatic, no cyanosis or edema Neurologic: Grossly normal Breasts: right breast normal without mass, skin or nipple changes or axillary nodes, left breast with prior surgical scar and palpable new mass in upper outer quadrant, skin or nipple changes or axillary nodes.   LABORATORY DATA: Lab Results  Component Value Date   WBC 12.3* 11/19/2013   HGB 12.4 11/19/2013   HCT 37.9 11/19/2013   MCV 91.0 11/19/2013   PLT 151 11/19/2013      Chemistry      Component Value Date/Time   NA 141 11/19/2013 1232   K 3.8 11/19/2013 1232  CO2 27 11/19/2013 1232   BUN 9.8 11/19/2013 1232   CREATININE 0.7 11/19/2013 1232      Component Value Date/Time   CALCIUM 9.4 11/19/2013 1232   ALKPHOS 101 11/19/2013 1232   AST 36* 11/19/2013 1232   ALT 134* 11/19/2013 1232   BILITOT 0.25 11/19/2013 1232       RADIOGRAPHIC STUDIES:  Dg Chest 2 View  10/24/2013   CLINICAL DATA:  Hypertension.  EXAM: CHEST  2 VIEW  COMPARISON:  None.  FINDINGS: The heart size and mediastinal contours are within normal limits. Both lungs are clear. Dextroscoliosis of lower thoracic spine is noted.  IMPRESSION: No active cardiopulmonary disease.   Electronically Signed   By: Sabino Dick M.D.   On: 10/24/2013 12:27   Ct Chest W Contrast  11/01/2013   CLINICAL DATA:  Breast cancer  EXAM: CT CHEST, ABDOMEN, AND PELVIS WITH CONTRAST  TECHNIQUE: Multidetector CT imaging of the chest,  abdomen and pelvis was performed following the standard protocol during bolus administration of intravenous contrast.  CONTRAST:  148m OMNIPAQUE IOHEXOL 300 MG/ML  SOLN  COMPARISON:  None.  FINDINGS: CT CHEST FINDINGS  There is no pleural effusion identified. Calcified granulomas identified in the right upper lobe. There is no suspicious pulmonary nodule or mass identified.  Normal heart size. No pericardial effusion. No enlarged mediastinal or hilar lymph nodes identified. There is no axillary or supraclavicular adenopathy noted.  Review of the visualized osseous structures is negative for aggressive lytic or sclerotic bone lesion.  CT ABDOMEN AND PELVIS FINDINGS  There is marked diffuse low attenuation throughout the liver parenchyma compatible with hepatic steatosis. Hyper enhancing structure within the posteromedial right hepatic lobe is identified. This measures 1.2 cm and appears to follow the density of blood pool. On the PET-CT from today there is no corresponding abnormal radiotracer activity . The gallbladder is normal. No biliary dilatation. Normal appearance of the pancreas. The spleen is on unremarkable.  The adrenal glands are both normal. The kidneys are both on unremarkable. Normal appearance of the urinary bladder. The uterus and adnexal structures are unremarkable.  No enlarged lymph nodes within the upper abdomen. No pelvic or inguinal adenopathy identified.  The stomach is normal. The small bowel loops have a normal course and caliber without evidence for bowel obstruction.  Review of the visualized bony structures is negative for aggressive lytic or sclerotic bone lesion.  IMPRESSION: 1. No acute findings within the chest abdomen or pelvis. There are no specific features identified to suggest metastatic disease. 2. Hepatic steatosis. 3. Enhancing liver lesion is favored to represent a benign hemangioma. Attention on followup imaging is advised.   Electronically Signed   By: TKerby MoorsM.D.    On: 11/01/2013 16:05   Ct Abdomen Pelvis W Contrast  11/01/2013   CLINICAL DATA:  Breast cancer  EXAM: CT CHEST, ABDOMEN, AND PELVIS WITH CONTRAST  TECHNIQUE: Multidetector CT imaging of the chest, abdomen and pelvis was performed following the standard protocol during bolus administration of intravenous contrast.  CONTRAST:  1062mOMNIPAQUE IOHEXOL 300 MG/ML  SOLN  COMPARISON:  None.  FINDINGS: CT CHEST FINDINGS  There is no pleural effusion identified. Calcified granulomas identified in the right upper lobe. There is no suspicious pulmonary nodule or mass identified.  Normal heart size. No pericardial effusion. No enlarged mediastinal or hilar lymph nodes identified. There is no axillary or supraclavicular adenopathy noted.  Review of the visualized osseous structures is negative for aggressive lytic or sclerotic bone  lesion.  CT ABDOMEN AND PELVIS FINDINGS  There is marked diffuse low attenuation throughout the liver parenchyma compatible with hepatic steatosis. Hyper enhancing structure within the posteromedial right hepatic lobe is identified. This measures 1.2 cm and appears to follow the density of blood pool. On the PET-CT from today there is no corresponding abnormal radiotracer activity . The gallbladder is normal. No biliary dilatation. Normal appearance of the pancreas. The spleen is on unremarkable.  The adrenal glands are both normal. The kidneys are both on unremarkable. Normal appearance of the urinary bladder. The uterus and adnexal structures are unremarkable.  No enlarged lymph nodes within the upper abdomen. No pelvic or inguinal adenopathy identified.  The stomach is normal. The small bowel loops have a normal course and caliber without evidence for bowel obstruction.  Review of the visualized bony structures is negative for aggressive lytic or sclerotic bone lesion.  IMPRESSION: 1. No acute findings within the chest abdomen or pelvis. There are no specific features identified to suggest  metastatic disease. 2. Hepatic steatosis. 3. Enhancing liver lesion is favored to represent a benign hemangioma. Attention on followup imaging is advised.   Electronically Signed   By: Kerby Moors M.D.   On: 11/01/2013 16:05   Nm Pet Image Restag (ps) Skull Base To Thigh  11/01/2013   CLINICAL DATA:  Initial treatment strategy for breast cancer.  EXAM: NUCLEAR MEDICINE PET SKULL BASE TO THIGH  FASTING BLOOD GLUCOSE:  Value: 97 mg/dl  TECHNIQUE: 9.0 mCi F-18 FDG was injected intravenously. Full-ring PET imaging was performed from the skull base to thigh after the radiotracer. CT data was obtained and used for attenuation correction and anatomic localization.  COMPARISON:  None.  FINDINGS: NECK  No hypermetabolic lymph nodes in the neck.  CHEST  No hypermetabolic mediastinal or hilar nodes. No suspicious pulmonary nodules on the CT scan. Clip in the left breast and surrounding nodule measure 1.1 cm. The SUV max associated with this nodule is equal to 1.6, image 68.  ABDOMEN/PELVIS  No abnormal hypermetabolic activity within the liver, pancreas, adrenal glands, or spleen. No hypermetabolic lymph nodes in the abdomen or pelvis.  SKELETON  No focal hypermetabolic activity to suggest skeletal metastasis.  IMPRESSION: 1. No evidence for hypermetabolic metastasis.   Electronically Signed   By: Kerby Moors M.D.   On: 11/01/2013 14:00   Dg Chest Port 1 View  10/25/2013   CLINICAL DATA:  Port-A-Cath placement  EXAM: PORTABLE CHEST - 1 VIEW  COMPARISON:  Portable exam 1454 hr compared to 10/24/2013  FINDINGS: Right subclavian power port with tip projecting over right atrium.  Enlargement of cardiac silhouette with slight pulmonary vascular congestion.  Mediastinal contours normal.  Basilar atelectasis without infiltrate, pleural effusion or pneumothorax.  IMPRESSION: No pneumothorax following right subclavian Port-A-Cath placement.  Tip Port-A-Cath catheter projects over right atrium.  Enlargement of cardiac  silhouette with right basilar atelectasis.   Electronically Signed   By: Lavonia Dana M.D.   On: 10/25/2013 15:05   Dg C-arm 1-60 Min-no Report  10/25/2013   CLINICAL DATA: port-a-cath placement   C-ARM 1-60 MINUTES  Fluoroscopy was utilized by the requesting physician.  No radiographic  interpretation.     ASSESSMENT/PLAN: 53 year old female with  #1 stage II (T2 NX) invasive ductal carcinoma of the left breast possibly recurrence. Patient has had prior history of ipsilateral breast cancer originally treated in [place]. The new primary is ER positive PR positive HER-2 positive. Patient has had staging studies performed which  were negative for any evidence of metastatic disease distally.  #2. Chemotherapy: s/p Neoadjuvant consisting of Taxotere carboplatinum Herceptin/perjet here for cycle 1 day 8 today. Her blood work looks terrific she she is not neutropenic She will receive day 2 Neulasta.  #3 patient had echocardiogram performed it did reveal ejection fraction of 56%. We will proceed with the treatment. Patient will also be seen by cardiology. There is a referral already made.  #4 antiemetics: Patient has all of her prescriptions. She is instructed on how to take these.  #5 FE N.: We discussed nutrition and hydration as being a very important part of therapy. She knows she needs to continue to hydrate herself on a daily basis.  #6. Diarrhea: secondary to the treatment, now resolved  #6 physical activity: We discussed continuing to exercise at least 10-20 minutes a day. She certainly can walk on a treadmill on a daily basis.  #7 followup: Patient will be seen back in 2 weeks for blood work, office visit and cycle 2 day 1 of TCH/P.     All questions were answered. The patient knows to call the clinic with any problems, questions or concerns. We can certainly see the patient much sooner if necessary.  I spent 20 minutes counseling the patient face to face. The total time spent in the  appointment was 30 minutes.    Marcy Panning, MD Medical/Oncology [facility] [phone] (beeper) [phone] (Office)  11/19/2013, 5:06 PM

## 2013-11-19 NOTE — Patient Instructions (Signed)
You are doing well  Continue to push fluids  I will see you back in 12/03/13

## 2013-11-20 ENCOUNTER — Telehealth: Payer: Self-pay | Admitting: Oncology

## 2013-11-20 ENCOUNTER — Encounter: Payer: Self-pay | Admitting: Gynecology

## 2013-11-20 NOTE — Telephone Encounter (Signed)
, °

## 2013-11-26 ENCOUNTER — Telehealth: Payer: Self-pay | Admitting: Oncology

## 2013-11-27 ENCOUNTER — Ambulatory Visit (INDEPENDENT_AMBULATORY_CARE_PROVIDER_SITE_OTHER): Payer: Federal, State, Local not specified - PPO | Admitting: Surgery

## 2013-11-27 ENCOUNTER — Encounter (INDEPENDENT_AMBULATORY_CARE_PROVIDER_SITE_OTHER): Payer: Self-pay | Admitting: Surgery

## 2013-11-27 ENCOUNTER — Telehealth: Payer: Self-pay | Admitting: Oncology

## 2013-11-27 VITALS — BP 130/80 | HR 70 | Temp 97.6°F | Resp 14 | Ht 67.25 in | Wt 178.6 lb

## 2013-11-27 DIAGNOSIS — C50412 Malignant neoplasm of upper-outer quadrant of left female breast: Secondary | ICD-10-CM

## 2013-11-27 DIAGNOSIS — C50419 Malignant neoplasm of upper-outer quadrant of unspecified female breast: Secondary | ICD-10-CM

## 2013-11-27 MED ORDER — CEPHALEXIN 250 MG PO CAPS: 250.0000 mg | ORAL_CAPSULE | Freq: Four times a day (QID) | ORAL | Status: DC

## 2013-11-27 NOTE — Telephone Encounter (Signed)
, °

## 2013-11-27 NOTE — Patient Instructions (Signed)
Lymphedema clinic [phone]  [facility]"

## 2013-11-27 NOTE — Progress Notes (Signed)
Status post Port-A-Cath placement on 10/25/13 for neoadjuvant chemotherapy for recurrent left breast cancer. She has received one cycle of chemotherapy and seems to be responding. The left breast cancer it is softer. She is tolerating chemotherapy reasonably well with only some diarrhea. Her port is functioning well and is causing no problems. The incision is well-healed with no sign of infection.  The patient has noticeable left arm swelling. This has been present for several years ever since her left axillary lymph node dissection. She has some tenderness and mild erythema under her left thumbnail. There are no areas of purulence that need to be drained.  We will start the patient on a one-week course of Keflex. She has been referred to be fitted for a left arm sleeve for lymphedema. We will recheck her in 3 months. When she has completed her chemotherapy she is interested in bilateral mastectomies with immediate reconstruction.  Kelsey Mclaughlin. Georgette Dover, MD, [facility]  General/ Trauma Surgery  11/27/2013 9:43 AM

## 2013-12-03 ENCOUNTER — Encounter: Payer: Self-pay | Admitting: Oncology

## 2013-12-03 ENCOUNTER — Ambulatory Visit (HOSPITAL_BASED_OUTPATIENT_CLINIC_OR_DEPARTMENT_OTHER): Payer: Federal, State, Local not specified - PPO | Admitting: Oncology

## 2013-12-03 ENCOUNTER — Ambulatory Visit (HOSPITAL_BASED_OUTPATIENT_CLINIC_OR_DEPARTMENT_OTHER): Payer: Federal, State, Local not specified - PPO

## 2013-12-03 ENCOUNTER — Other Ambulatory Visit (HOSPITAL_BASED_OUTPATIENT_CLINIC_OR_DEPARTMENT_OTHER): Payer: Federal, State, Local not specified - PPO

## 2013-12-03 VITALS — BP 140/82 | HR 79 | Temp 98.3°F | Resp 16 | Wt 183.1 lb

## 2013-12-03 DIAGNOSIS — Z5111 Encounter for antineoplastic chemotherapy: Secondary | ICD-10-CM

## 2013-12-03 DIAGNOSIS — Z853 Personal history of malignant neoplasm of breast: Secondary | ICD-10-CM

## 2013-12-03 DIAGNOSIS — C50419 Malignant neoplasm of upper-outer quadrant of unspecified female breast: Secondary | ICD-10-CM

## 2013-12-03 DIAGNOSIS — D72829 Elevated white blood cell count, unspecified: Secondary | ICD-10-CM

## 2013-12-03 DIAGNOSIS — Z5112 Encounter for antineoplastic immunotherapy: Secondary | ICD-10-CM

## 2013-12-03 DIAGNOSIS — C50412 Malignant neoplasm of upper-outer quadrant of left female breast: Secondary | ICD-10-CM

## 2013-12-03 DIAGNOSIS — D6481 Anemia due to antineoplastic chemotherapy: Secondary | ICD-10-CM

## 2013-12-03 DIAGNOSIS — T451X5A Adverse effect of antineoplastic and immunosuppressive drugs, initial encounter: Secondary | ICD-10-CM

## 2013-12-03 DIAGNOSIS — Z170 Estrogen receptor positive status [ER+]: Secondary | ICD-10-CM

## 2013-12-03 DIAGNOSIS — R740 Nonspecific elevation of levels of transaminase and lactic acid dehydrogenase [LDH]: Secondary | ICD-10-CM

## 2013-12-03 DIAGNOSIS — R7401 Elevation of levels of liver transaminase levels: Secondary | ICD-10-CM

## 2013-12-03 DIAGNOSIS — I890 Lymphedema, not elsewhere classified: Secondary | ICD-10-CM

## 2013-12-03 DIAGNOSIS — R7402 Elevation of levels of lactic acid dehydrogenase (LDH): Secondary | ICD-10-CM

## 2013-12-03 LAB — CBC WITH DIFFERENTIAL/PLATELET
BASO%: 0.1 % (ref 0.0–2.0)
Basophils Absolute: 0 10*3/uL (ref 0.0–0.1)
EOS%: 0 % (ref 0.0–7.0)
Eosinophils Absolute: 0 10*3/uL (ref 0.0–0.5)
HCT: 33.1 % — ABNORMAL LOW (ref 34.8–46.6)
HGB: 11 g/dL — ABNORMAL LOW (ref 11.6–15.9)
LYMPH%: 6.7 % — AB (ref 14.0–49.7)
MCH: 30.4 pg (ref 25.1–34.0)
MCHC: 33.2 g/dL (ref 31.5–36.0)
MCV: 91.4 fL (ref 79.5–101.0)
MONO#: 0.2 10*3/uL (ref 0.1–0.9)
MONO%: 1.4 % (ref 0.0–14.0)
NEUT#: 10.3 10*3/uL — ABNORMAL HIGH (ref 1.5–6.5)
NEUT%: 91.8 % — ABNORMAL HIGH (ref 38.4–76.8)
PLATELETS: 350 10*3/uL (ref 145–400)
RBC: 3.62 10*6/uL — AB (ref 3.70–5.45)
RDW: 14.7 % — ABNORMAL HIGH (ref 11.2–14.5)
WBC: 11.2 10*3/uL — ABNORMAL HIGH (ref 3.9–10.3)
lymph#: 0.8 10*3/uL — ABNORMAL LOW (ref 0.9–3.3)
nRBC: 0 % (ref 0–0)

## 2013-12-03 LAB — COMPREHENSIVE METABOLIC PANEL (CC13)
ALT: 91 U/L — ABNORMAL HIGH (ref 0–55)
ANION GAP: 11 meq/L (ref 3–11)
AST: 27 U/L (ref 5–34)
Albumin: 4.1 g/dL (ref 3.5–5.0)
Alkaline Phosphatase: 83 U/L (ref 40–150)
BILIRUBIN TOTAL: 0.2 mg/dL (ref 0.20–1.20)
BUN: 10.7 mg/dL (ref 7.0–26.0)
CO2: 22 mEq/L (ref 22–29)
CREATININE: 0.7 mg/dL (ref 0.6–1.1)
Calcium: 9.9 mg/dL (ref 8.4–10.4)
Chloride: 108 mEq/L (ref 98–109)
Glucose: 149 mg/dl — ABNORMAL HIGH (ref 70–140)
Potassium: 3.8 mEq/L (ref 3.5–5.1)
Sodium: 142 mEq/L (ref 136–145)
Total Protein: 7.1 g/dL (ref 6.4–8.3)

## 2013-12-03 MED ORDER — ACETAMINOPHEN 325 MG PO TABS
650.0000 mg | ORAL_TABLET | Freq: Once | ORAL | Status: AC
  Administered 2013-12-03: 650 mg via ORAL

## 2013-12-03 MED ORDER — DIPHENHYDRAMINE HCL 25 MG PO CAPS
ORAL_CAPSULE | ORAL | Status: AC
  Filled 2013-12-03: qty 2

## 2013-12-03 MED ORDER — DEXAMETHASONE SODIUM PHOSPHATE 20 MG/5ML IJ SOLN
20.0000 mg | Freq: Once | INTRAMUSCULAR | Status: AC
  Administered 2013-12-03: 20 mg via INTRAVENOUS

## 2013-12-03 MED ORDER — ACETAMINOPHEN 325 MG PO TABS
ORAL_TABLET | ORAL | Status: AC
  Filled 2013-12-03: qty 2

## 2013-12-03 MED ORDER — ONDANSETRON 16 MG/50ML IVPB (CHCC)
INTRAVENOUS | Status: AC
  Filled 2013-12-03: qty 16

## 2013-12-03 MED ORDER — SODIUM CHLORIDE 0.9 % IJ SOLN
10.0000 mL | INTRAMUSCULAR | Status: DC | PRN
  Administered 2013-12-03: 10 mL
  Filled 2013-12-03: qty 10

## 2013-12-03 MED ORDER — DEXAMETHASONE SODIUM PHOSPHATE 20 MG/5ML IJ SOLN
INTRAMUSCULAR | Status: AC
Start: 2013-12-03 — End: 2013-12-03
  Filled 2013-12-03: qty 5

## 2013-12-03 MED ORDER — ONDANSETRON 16 MG/50ML IVPB (CHCC)
16.0000 mg | Freq: Once | INTRAVENOUS | Status: AC
  Administered 2013-12-03: 16 mg via INTRAVENOUS

## 2013-12-03 MED ORDER — SODIUM CHLORIDE 0.9 % IV SOLN
420.0000 mg | Freq: Once | INTRAVENOUS | Status: AC
  Administered 2013-12-03: 420 mg via INTRAVENOUS
  Filled 2013-12-03: qty 14

## 2013-12-03 MED ORDER — HEPARIN SOD (PORK) LOCK FLUSH 100 UNIT/ML IV SOLN
500.0000 [IU] | Freq: Once | INTRAVENOUS | Status: AC | PRN
  Administered 2013-12-03: 500 [IU]
  Filled 2013-12-03: qty 5

## 2013-12-03 MED ORDER — SODIUM CHLORIDE 0.9 % IV SOLN
75.0000 mg/m2 | Freq: Once | INTRAVENOUS | Status: AC
  Administered 2013-12-03: 150 mg via INTRAVENOUS
  Filled 2013-12-03: qty 15

## 2013-12-03 MED ORDER — CARBOPLATIN CHEMO INJECTION 600 MG/60ML
771.0000 mg | Freq: Once | INTRAVENOUS | Status: AC
  Administered 2013-12-03: 770 mg via INTRAVENOUS
  Filled 2013-12-03: qty 77

## 2013-12-03 MED ORDER — TRASTUZUMAB CHEMO INJECTION 440 MG
6.0000 mg/kg | Freq: Once | INTRAVENOUS | Status: AC
  Administered 2013-12-03: 483 mg via INTRAVENOUS
  Filled 2013-12-03: qty 23

## 2013-12-03 MED ORDER — SODIUM CHLORIDE 0.9 % IV SOLN
Freq: Once | INTRAVENOUS | Status: AC
  Administered 2013-12-03: 10:00:00 via INTRAVENOUS

## 2013-12-03 MED ORDER — DIPHENHYDRAMINE HCL 25 MG PO CAPS
50.0000 mg | ORAL_CAPSULE | Freq: Once | ORAL | Status: AC
  Administered 2013-12-03: 50 mg via ORAL

## 2013-12-03 NOTE — Progress Notes (Signed)
[facility]el[facility] Mcl[facility] OFFICE PROGRESS NOTE  Patient Care Team: Provider Default, MD as PCP - General Kelsey Mink, MD as Referring Physician [facility] Medicine) Dr. Jaymes Graff  DIAGNOSIS: 53 year old female with recurrent left breast cancer.   Breast cancer of upper-outer quadrant of left female breast  Primary site: Breast (Left)  Staging method: AJCC 7th Edition  Clinical: Stage IIA (T2, NX, cM0) signed by [name], MD on 11/19/2013 5:08 PM  Summary: Stage IIA (T2, NX, cM0)   SUMMARY OF ONCOLOGIC HISTORY:  1. In 1998 at the age of 24 was diagnosed with left breast cancer in [place]. This was an invasive carcinoma grade 2 stage II. At that time she underwent a lumpectomy with excellent lymph node dissection. She received 6 months of chemotherapy. [name] She also had radiation therapy to the left breast. 2011 patient noticed a lump in the outside of her left breast. She had ultrasound workup performed and was told it was negative no biopsies were performed. In January 2014 after patient moved to the [place] she had a mammogram performed this revealed a possible mass in the outer quadrant of the left breast. Ultrasound showed it to be 1.7 cm. MRI of the breasts performed on January 30 revealed the mass to be 1.5 x 2.2 x 1.5 cm. Because of this she also had a biopsy performed on 10/04/2013. The biopsy revealed an invasive ductal carcinoma with ductal carcinoma in situ grade 2. Prognostic panel was positive for estrogen receptor 100% progesterone receptor +33% proliferation marker Ki-67 15% and the tumor was HER-2/neu positive.  2. staging scans performed on 11/01/2013 consisting of PET scan and CT scans:  CT scan:  1. No acute findings within the chest abdomen or pelvis. There are  no specific features identified to suggest metastatic disease.  2. Hepatic steatosis.  3. Enhancing liver lesion is favored to  represent a benign  hemangioma. Attention on followup imaging is advised.  PET scan: No evidence for hypermetabolic metastasis.  3. Echocardiogram on 11/01/13:   - Left ventricle: The cavity size was normal. Wall thickness was normal. The ejection fraction was 56% by biplane speckle tracking. Wall motion was normal; there were no regional wall motion abnormalities. The transmitral flow pattern was borderline abnormal. Strain imaging, however, indicates normal longitudinal strain (< -20%). - Left atrium: The atrium was normal in size. - Inferior vena cava: The vessel was normal in size; the respirophasic diameter changes were in the normal range (= 50%); findings are consistent with normal central venous pressure.  4. neoadjuvant chemotherapy consisting of Taxotere carboplatinum Herceptin/Perjeta beginning 11/12/13 every 3 weeks x 6 cycles planned.   CURRENT THERAPY:  Cycle 2 day 1 neoadjuvant TCH/P. beginning 12/03/2013 with day 2 Neulasta on 12/04/2013  INTERVAL HISTORY: Kelsey Mclaughlin 53 y.o. female returns for followup visit and to begin cycle 2 day 1 of TCH/P. overall patient tolerated her first cycle well. Her course complicated by development of left upper extremity lymphedema. She does have history of lymph node dissection previously and this may be the cause. Patient did lose all of her hair. She is wearing a wig and is comfortable with this. Today she also complains of having a little bit of a sore throat. Her stepdaughter did have an upper respiratory tract infection. She otherwise has no complaints.  I have reviewed the past medical history, past surgical history, social history and family history with  the patient and they are unchanged from previous note.  ALLERGIES:  has No Known Allergies.  MEDICATIONS:  Current Outpatient Prescriptions  Medication Sig Dispense Refill  . dexamethasone (DECADRON) 4 MG tablet       . lidocaine-prilocaine (EMLA) cream Apply to  port-a-cath 1 - 2 hours prior to being accessed.  30 g  3  . LORazepam (ATIVAN) 0.5 MG tablet Take 1 tablet (0.5 mg total) by mouth every 6 (six) hours as needed (Nausea or vomiting).  30 tablet  0  . ondansetron (ZOFRAN) 8 MG tablet Take 1 tablet (8 mg total) by mouth 2 (two) times daily. Start the day after chemo for 3 days. Then take as needed for nausea or vomiting.  30 tablet  1  . prochlorperazine (COMPAZINE) 10 MG tablet Take 1 tablet (10 mg total) by mouth every 6 (six) hours as needed (Nausea or vomiting).  30 tablet  1  . cephALEXin (KEFLEX) 250 MG capsule Take 1 capsule (250 mg total) by mouth 4 (four) times daily.  28 capsule  0  . HYDROcodone-acetaminophen (NORCO) 10-325 MG per tablet       . promethazine-dextromethorphan (PROMETHAZINE-DM) 6.25-15 MG/5ML syrup       . telmisartan-hydrochlorothiazide (MICARDIS HCT) 80-12.5 MG per tablet Take 0.5 tablets by mouth daily with lunch.        No current facility-administered medications for this visit.    REVIEW OF SYSTEMS:   Constitutional: Denies fevers, chills or abnormal weight loss Eyes: Denies blurriness of vision Ears, nose, mouth, throat, and face: Denies mucositis or sore throat Respiratory: Denies cough, dyspnea or wheezes Cardiovascular: Denies palpitation, chest discomfort or lower extremity swelling Gastrointestinal:  Denies nausea, heartburn or change in bowel habits Skin: Denies abnormal skin rashes Lymphatics: Denies new lymphadenopathy or easy bruising Neurological:Denies numbness, tingling or new weaknesses Behavioral/Psych: Mood is stable, no new changes  Extremities: Left upper extremity edema noted by patient All other systems were reviewed with the patient and are negative.  PHYSICAL EXAMINATION: ECOG PERFORMANCE STATUS: 1 - Symptomatic but completely ambulatory  Filed Vitals:   12/03/13 0836  BP: 140/82  Pulse: 79  Temp: 98.3 F (36.8 C)  Resp: 16   Filed Weights   12/03/13 0836  Weight: 183 lb 1.6  oz (83.054 kg)    GENERAL:alert, no distress and comfortable SKIN: skin color, texture, turgor are normal, no rashes or significant lesions EYES: normal, Conjunctiva are pink and non-injected, sclera clear OROPHARYNX:no exudate, no erythema and lips, buccal mucosa, and tongue normal  NECK: supple, thyroid normal size, non-tender, without nodularity LYMPH:  no palpable lymphadenopathy in the cervical, axillary or inguinal LUNGS: clear to auscultation and percussion with normal breathing effort HEART: regular rate & rhythm and no murmurs and no lower extremity edema ABDOMEN:abdomen soft, non-tender and normal bowel sounds Musculoskeletal:no cyanosis of digits and no clubbing  NEURO: alert & oriented x 3 with fluent speech, no focal motor/sensory deficits Extremities: Swelling of the right upper extremity as well as her hand. No right upper extremity swelling no lower extremity swelling.  LABORATORY DATA:  I have reviewed the data as listed    Component Value Date/Time   NA 141 11/19/2013 1232   K 3.8 11/19/2013 1232   CO2 27 11/19/2013 1232   GLUCOSE 127 11/19/2013 1232   BUN 9.8 11/19/2013 1232   CREATININE 0.7 11/19/2013 1232   CALCIUM 9.4 11/19/2013 1232   PROT 6.7 11/19/2013 1232   ALBUMIN 3.8 11/19/2013 1232   AST 36* 11/19/2013  1232   ALT 134* 11/19/2013 1232   ALKPHOS 101 11/19/2013 1232   BILITOT 0.25 11/19/2013 1232    No results found for this basename: SPEP, UPEP,  kappa and lambda light chains    Lab Results  Component Value Date   WBC 11.2* 12/03/2013   NEUTROABS 10.3* 12/03/2013   HGB 11.0* 12/03/2013   HCT 33.1* 12/03/2013   MCV 91.4 12/03/2013   PLT 350 12/03/2013      Chemistry      Component Value Date/Time   NA 141 11/19/2013 1232   K 3.8 11/19/2013 1232   CO2 27 11/19/2013 1232   BUN 9.8 11/19/2013 1232   CREATININE 0.7 11/19/2013 1232      Component Value Date/Time   CALCIUM 9.4 11/19/2013 1232   ALKPHOS 101 11/19/2013 1232   AST 36* 11/19/2013 1232   ALT 134* 11/19/2013 1232   BILITOT  0.25 11/19/2013 1232       RADIOGRAPHIC STUDIES: I have personally reviewed the radiological images as listed and agreed with the findings in the report. No results found.    ASSESSMENT & PLAN:  53 year old female with  #1 stage II (T2 Nx) invasive ductal carcinoma of the left breast possibly recurrence. Patient had a prior history of ipsilateral breast cancer treated in [place]. Her new primary is ER positive PR positive HER-2 positive. Staging studies that were performed showed no evidence of metastatic disease distally.  #2 chemotherapy: Patient is here for cycle 2 of Taxotere carboplatinum Herceptin and perjeta today. She tolerated the first cycle very well. We will proceed with her scheduled treatment. I have reviewed her blood work.  #3 left upper extremity lymphedema: Secondary to prior lymph dissection. She was seen by her surgeon who did recommend a lymphedema sleeve. However I do think patient also needs to be seen by physical therapy. I have made a referral for her. 4 physical therapy manual drainage of lymph and then eventually lymphedema sleeve.  #4 anemia: Secondary to chemotherapy we will continue to follow.  #5 liver function studies: On 11/19/2013 patient's transaminases were slightly up. We do not have results of these from today. We will followup on this.  #6 leukocytosis: Secondary to Neulasta injection given from prior chemotherapy we will continue to monitor. She will get Neulasta injection tomorrow as well. It is expected that she will overshoot.  #7 physical activity: We again discussed patient at least exercising 10-20 minutes a day on a daily basis.  #8 diarrhea: This is now resolved. But she is cautioned that she may get it again because of the treatment that she is receiving. We discussed her taking Imodium on an as-needed basis when she does start having diarrhea. We also discussed pushing fluids.  #8 followup: Patient will be seen back in one week for followup  for chemotherapy toxicity assessment and blood work.  No orders of the defined types were placed in this encounter.   All questions were answered. The patient knows to call the clinic with any problems, questions or concerns. No barriers to learning was detected. I spent 30 minutes counseling the patient face to face. The total time spent in the appointment was 30 minutes and more than 50% was on counseling and review of test results     Marcy Panning, MD 12/03/2013 8:42 AM

## 2013-12-03 NOTE — Progress Notes (Signed)
Enrolled pt in the Neulasta First Step program.  I faxed signed form and activated card today.  °

## 2013-12-03 NOTE — Patient Instructions (Signed)
[facility] Discharge Instructions for Patients Receiving Chemotherapy  Today you received the following chemotherapy agents Herceptin, Perjeta, Taxotere, Carboplatin.  To help prevent nausea and vomiting after your treatment, we encourage you to take your nausea medication as prescribed.   If you develop nausea and vomiting that is not controlled by your nausea medication, call the clinic.   BELOW ARE SYMPTOMS THAT SHOULD BE REPORTED IMMEDIATELY:  *FEVER GREATER THAN 100.5 F  *CHILLS WITH OR WITHOUT FEVER  NAUSEA AND VOMITING THAT IS NOT CONTROLLED WITH YOUR NAUSEA MEDICATION  *UNUSUAL SHORTNESS OF BREATH  *UNUSUAL BRUISING OR BLEEDING  TENDERNESS IN MOUTH AND THROAT WITH OR WITHOUT PRESENCE OF ULCERS  *URINARY PROBLEMS  *BOWEL PROBLEMS  UNUSUAL RASH Items with * indicate a potential emergency and should be followed up as soon as possible.  Feel free to call the clinic you have any questions or concerns. The clinic phone number is [phone].    

## 2013-12-03 NOTE — Patient Instructions (Signed)
Proceed with cycle 2 of treatment.  He will return tomorrow for Neulasta injection  Please push fluids and take Imodium if you develop diarrhea also call us if needed  I will refer you to the lymphedema clinic for further evaluation of the left upper extremity edema  We will see you back in one week for blood work and office visit.

## 2013-12-04 ENCOUNTER — Ambulatory Visit (HOSPITAL_BASED_OUTPATIENT_CLINIC_OR_DEPARTMENT_OTHER): Payer: Federal, State, Local not specified - PPO

## 2013-12-04 VITALS — BP 140/69 | HR 62 | Temp 98.3°F

## 2013-12-04 DIAGNOSIS — C50419 Malignant neoplasm of upper-outer quadrant of unspecified female breast: Secondary | ICD-10-CM

## 2013-12-04 DIAGNOSIS — C50412 Malignant neoplasm of upper-outer quadrant of left female breast: Secondary | ICD-10-CM

## 2013-12-04 DIAGNOSIS — Z5189 Encounter for other specified aftercare: Secondary | ICD-10-CM

## 2013-12-04 MED ORDER — PEGFILGRASTIM INJECTION 6 MG/0.6ML
6.0000 mg | Freq: Once | SUBCUTANEOUS | Status: AC
  Administered 2013-12-04: 6 mg via SUBCUTANEOUS
  Filled 2013-12-04: qty 0.6

## 2013-12-05 ENCOUNTER — Ambulatory Visit: Payer: Federal, State, Local not specified - PPO | Admitting: Physical Therapy

## 2013-12-05 ENCOUNTER — Encounter: Payer: Self-pay | Admitting: Certified Nurse Midwife

## 2013-12-06 ENCOUNTER — Ambulatory Visit: Payer: Federal, State, Local not specified - PPO | Attending: Oncology | Admitting: Physical Therapy

## 2013-12-06 DIAGNOSIS — I890 Lymphedema, not elsewhere classified: Secondary | ICD-10-CM | POA: Insufficient documentation

## 2013-12-06 DIAGNOSIS — IMO0001 Reserved for inherently not codable concepts without codable children: Secondary | ICD-10-CM | POA: Insufficient documentation

## 2013-12-06 DIAGNOSIS — C50419 Malignant neoplasm of upper-outer quadrant of unspecified female breast: Secondary | ICD-10-CM | POA: Insufficient documentation

## 2013-12-06 NOTE — Progress Notes (Signed)
PT Svc treatment plan for approval dtd 12/06/13.  Faxed. Sent to scan.

## 2013-12-10 ENCOUNTER — Encounter: Payer: Self-pay | Admitting: Oncology

## 2013-12-10 ENCOUNTER — Ambulatory Visit: Payer: Federal, State, Local not specified - PPO | Admitting: Physical Therapy

## 2013-12-10 ENCOUNTER — Ambulatory Visit (HOSPITAL_COMMUNITY)
Admission: RE | Admit: 2013-12-10 | Discharge: 2013-12-10 | Disposition: A | Discharge status: Home / Self Care | Payer: Federal, State, Local not specified - PPO | Source: Ambulatory Visit | Attending: Internal Medicine | Admitting: Internal Medicine

## 2013-12-10 ENCOUNTER — Ambulatory Visit (HOSPITAL_BASED_OUTPATIENT_CLINIC_OR_DEPARTMENT_OTHER): Payer: Federal, State, Local not specified - PPO | Admitting: Oncology

## 2013-12-10 ENCOUNTER — Other Ambulatory Visit (HOSPITAL_BASED_OUTPATIENT_CLINIC_OR_DEPARTMENT_OTHER): Payer: Federal, State, Local not specified - PPO

## 2013-12-10 VITALS — BP 139/86 | HR 89 | Temp 98.2°F | Resp 18 | Ht 67.0 in | Wt 178.6 lb

## 2013-12-10 VITALS — BP 126/78 | HR 86 | Wt 180.8 lb

## 2013-12-10 DIAGNOSIS — G5690 Unspecified mononeuropathy of unspecified upper limb: Secondary | ICD-10-CM

## 2013-12-10 DIAGNOSIS — C50412 Malignant neoplasm of upper-outer quadrant of left female breast: Secondary | ICD-10-CM

## 2013-12-10 DIAGNOSIS — G629 Polyneuropathy, unspecified: Secondary | ICD-10-CM

## 2013-12-10 DIAGNOSIS — I890 Lymphedema, not elsewhere classified: Secondary | ICD-10-CM

## 2013-12-10 DIAGNOSIS — F419 Anxiety disorder, unspecified: Secondary | ICD-10-CM

## 2013-12-10 DIAGNOSIS — R7989 Other specified abnormal findings of blood chemistry: Secondary | ICD-10-CM

## 2013-12-10 DIAGNOSIS — Z170 Estrogen receptor positive status [ER+]: Secondary | ICD-10-CM

## 2013-12-10 DIAGNOSIS — C50419 Malignant neoplasm of upper-outer quadrant of unspecified female breast: Secondary | ICD-10-CM

## 2013-12-10 LAB — CBC WITH DIFFERENTIAL/PLATELET
BASO%: 0.2 % (ref 0.0–2.0)
Basophils Absolute: 0 10*3/uL (ref 0.0–0.1)
EOS%: 0.2 % (ref 0.0–7.0)
Eosinophils Absolute: 0 10*3/uL (ref 0.0–0.5)
HCT: 36.5 % (ref 34.8–46.6)
HGB: 12.3 g/dL (ref 11.6–15.9)
LYMPH#: 2.1 10*3/uL (ref 0.9–3.3)
LYMPH%: 20.1 % (ref 14.0–49.7)
MCH: 30.8 pg (ref 25.1–34.0)
MCHC: 33.7 g/dL (ref 31.5–36.0)
MCV: 91.3 fL (ref 79.5–101.0)
MONO#: 1.4 10*3/uL — AB (ref 0.1–0.9)
MONO%: 13.7 % (ref 0.0–14.0)
NEUT#: 6.8 10*3/uL — ABNORMAL HIGH (ref 1.5–6.5)
NEUT%: 65.8 % (ref 38.4–76.8)
NRBC: 0 % (ref 0–0)
Platelets: 269 10*3/uL (ref 145–400)
RBC: 4 10*6/uL (ref 3.70–5.45)
RDW: 14.6 % — ABNORMAL HIGH (ref 11.2–14.5)
WBC: 10.3 10*3/uL (ref 3.9–10.3)

## 2013-12-10 LAB — COMPREHENSIVE METABOLIC PANEL (CC13)
ALBUMIN: 4 g/dL (ref 3.5–5.0)
ALT: 125 U/L — AB (ref 0–55)
AST: 31 U/L (ref 5–34)
Alkaline Phosphatase: 105 U/L (ref 40–150)
Anion Gap: 12 mEq/L — ABNORMAL HIGH (ref 3–11)
BUN: 14.9 mg/dL (ref 7.0–26.0)
CALCIUM: 9.5 mg/dL (ref 8.4–10.4)
CHLORIDE: 102 meq/L (ref 98–109)
CO2: 24 meq/L (ref 22–29)
Creatinine: 0.7 mg/dL (ref 0.6–1.1)
Glucose: 96 mg/dl (ref 70–140)
POTASSIUM: 4.2 meq/L (ref 3.5–5.1)
Sodium: 138 mEq/L (ref 136–145)
TOTAL PROTEIN: 6.8 g/dL (ref 6.4–8.3)
Total Bilirubin: 0.28 mg/dL (ref 0.20–1.20)

## 2013-12-10 MED ORDER — GABAPENTIN 100 MG PO CAPS
100.0000 mg | ORAL_CAPSULE | Freq: Three times a day (TID) | ORAL | Status: DC
Start: 2013-12-10 — End: 2014-03-01

## 2013-12-10 MED ORDER — LORAZEPAM 0.5 MG PO TABS: 0.5000 mg | ORAL_TABLET | Freq: Four times a day (QID) | ORAL | Status: DC | PRN

## 2013-12-10 NOTE — Progress Notes (Signed)
Patient ID: Kelsey Mclaughlin, female   DOB: 1961-05-09, 53 y.o.   MRN: [id number] Oncologist: Dr. Humphrey Rolls  53 yo with recent diagnosis of recurrent breast cancer presents for cardiology evaluation in setting of Herceptin use.  Left breast cancer was diagnosed in 1998 and treated with lumpectomy, chemo, radiation (in [place]).  1/15 left breast biopsy showed recurrent cancer, ER+/PR+/HER-2/neu+.  Now getting neoadjuvant chemo with Taxotere/carboplatin/Herceptin/Perjeta (started in 3/15). I reviewed her echo from 2/15, which showed normal EF, lateral s', and strain pattern (baseline prior to chemotherapy).    She is stable in general.  No exertional dyspnea or chest pain.  Tolerating chemotherapy so far.  She is no longer taking BP meds as her BP has been running low with chemotherapy.   Labs (3/15): K 4.2, creatinine 0.7  PMH: 1. HTN 2. ?TIA in 2004 3. Breast cancer: Left breast CA diagnosed in 1998, treated with lumpectomy, chemo, radiation (in [place]).  1/15 left breast biopsy showed recurrent cancer, ER+/PR+/HER-2/neu+.  Getting neoadjuvant chemo with Taxotere/carboplatin/Herceptin/Perjeta (started 3/15).  - Echo (2/15) with EF 28%, grade I diastolic dysfunction, lateral s' 10 cm/sec, global longitudinal strain -20.2%.  4. Left upper extremity lymphedema.   SH: Originally from [place], does not speak [place].  Has significant other.  Not working.   FH: Father with MI at 52.   ROS: All systems reviewed and negative except as per HPI.   Current Outpatient Prescriptions  Medication Sig Dispense Refill  . cephALEXin (KEFLEX) 250 MG capsule Take 1 capsule (250 mg total) by mouth 4 (four) times daily.  28 capsule  0  . dexamethasone (DECADRON) 4 MG tablet       . gabapentin (NEURONTIN) 100 MG capsule Take 1 capsule (100 mg total) by mouth 3 (three) times daily.  90 capsule  4  . HYDROcodone-acetaminophen (NORCO) 10-325 MG per tablet       . lidocaine-prilocaine (EMLA) cream Apply to  port-a-cath 1 - 2 hours prior to being accessed.  30 g  3  . LORazepam (ATIVAN) 0.5 MG tablet Take 1 tablet (0.5 mg total) by mouth every 6 (six) hours as needed (Nausea or vomiting).  30 tablet  3  . ondansetron (ZOFRAN) 8 MG tablet Take 1 tablet (8 mg total) by mouth 2 (two) times daily. Start the day after chemo for 3 days. Then take as needed for nausea or vomiting.  30 tablet  1  . prochlorperazine (COMPAZINE) 10 MG tablet Take 1 tablet (10 mg total) by mouth every 6 (six) hours as needed (Nausea or vomiting).  30 tablet  1  . promethazine-dextromethorphan (PROMETHAZINE-DM) 6.25-15 MG/5ML syrup       . telmisartan-hydrochlorothiazide (MICARDIS HCT) 80-12.5 MG per tablet Take 0.5 tablets by mouth daily with lunch.        No current facility-administered medications for this encounter.    BP 126/78  Pulse 86  Wt 180 lb 12.8 oz (82.01 kg)  SpO2 97% General: NAD Neck: No JVD, no thyromegaly or thyroid nodule.  Lungs: Clear to auscultation bilaterally with normal respiratory effort. CV: Nondisplaced PMI.  Heart regular S1/S2, no S3/S4, no murmur.  No peripheral edema.  No carotid bruit.  Normal pedal pulses.  Abdomen: Soft, nontender, no hepatosplenomegaly, no distention.  Skin: Intact without lesions or rashes.  Neurologic: Alert and oriented x 3.  Psych: Normal affect. Extremities: No clubbing or cyanosis.  HEENT: Normal.   Assessment/Plan: 53 yo with recent diagnosis of recurrent breast cancer is now getting neoadjuvant chemotherapy involving Herceptin.  I reviewed her baseline echo from 2/15.  No significant abnormalities.  I will plan to have her return for echo and office visit in 3 months.   Loralie Champagne 12/10/2013

## 2013-12-10 NOTE — Progress Notes (Signed)
[facility] OFFICE PROGRESS NOTE  Patient Care Team: Provider Default, MD as PCP - General Kelsey Mink, MD as Referring Physician [facility] Medicine) Dr. Jaymes Mclaughlin  DIAGNOSIS: 53 year old female with recurrent left breast cancer.   Breast cancer of upper-outer quadrant of left female breast  Primary site: Breast (Left)  Staging method: AJCC 7th Edition  Clinical: Stage IIA (T2, NX, cM0) signed by [name], MD on 11/19/2013 5:08 PM  Summary: Stage IIA (T2, NX, cM0)   SUMMARY OF ONCOLOGIC HISTORY:  1. In 1998 at the age of 54 was diagnosed with left breast cancer in [place]. This was an invasive carcinoma grade 2 stage II. At that time she underwent a lumpectomy with excellent lymph node dissection. She received 6 months of chemotherapy. [name] She also had radiation therapy to the left breast. 2011 patient noticed a lump in the outside of her left breast. She had ultrasound workup performed and was told it was negative no biopsies were performed. In January 2014 after patient moved to the [place] she had a mammogram performed this revealed a possible mass in the outer quadrant of the left breast. Ultrasound showed it to be 1.7 cm. MRI of the breasts performed on January 30 revealed the mass to be 1.5 x 2.2 x 1.5 cm. Because of this she also had a biopsy performed on 10/04/2013. The biopsy revealed an invasive ductal carcinoma with ductal carcinoma in situ grade 2. Prognostic panel was positive for estrogen receptor 100% progesterone receptor +33% proliferation marker Ki-67 15% and the tumor was HER-2/neu positive.  2. staging scans performed on 11/01/2013 consisting of PET scan and CT scans:  CT scan:  1. No acute findings within the chest abdomen or pelvis. There are  no specific features identified to suggest metastatic disease.  2. Hepatic steatosis.  3. Enhancing liver lesion is favored to  represent a benign  hemangioma. Attention on followup imaging is advised.  PET scan: No evidence for hypermetabolic metastasis.  3. Echocardiogram on 11/01/13:   - Left ventricle: The cavity size was normal. Wall thickness was normal. The ejection fraction was 56% by biplane speckle tracking. Wall motion was normal; there were no regional wall motion abnormalities. The transmitral flow pattern was borderline abnormal. Strain imaging, however, indicates normal longitudinal strain (< -20%). - Left atrium: The atrium was normal in size. - Inferior vena cava: The vessel was normal in size; the respirophasic diameter changes were in the normal range (= 50%); findings are consistent with normal central venous pressure.  4. neoadjuvant chemotherapy consisting of Taxotere carboplatinum Herceptin/Perjeta beginning 11/12/13 every 3 weeks x 6 cycles planned.   CURRENT THERAPY:  Cycle 2 day 8 neoadjuvant TCH/P.  INTERVAL HISTORY: Kelsey Mclaughlin 53 y.o. female returns for followup visit.  Overall patient is doing well. She tolerated her cycle 2 of chemotherapy very nicely without any significant problems. She does tell me that she is a little fatigued. Otherwise no other complaints are given. Left upper extremity edema stable. She is being seen by the lymphedema clinic/physical therapy/cancer rehabilitation. She has no nausea vomiting no fevers chills. She's been eating well drinking well. She has not noticed any rashes. She is a little flushed today. She has lost all of her hair. She has noticed some sensitivity to cold as well as pins and needles sensation in her fingertips. Early signs of neuropathy. No diarrhea or constipation  I  have reviewed the past medical history, past surgical history, social history and family history with the patient and they are unchanged from previous note.  ALLERGIES:  has No Known Allergies.  MEDICATIONS:  Current Outpatient Prescriptions  Medication Sig Dispense  Refill  . cephALEXin (KEFLEX) 250 MG capsule Take 1 capsule (250 mg total) by mouth 4 (four) times daily.  28 capsule  0  . dexamethasone (DECADRON) 4 MG tablet       . HYDROcodone-acetaminophen (NORCO) 10-325 MG per tablet       . lidocaine-prilocaine (EMLA) cream Apply to port-a-cath 1 - 2 hours prior to being accessed.  30 g  3  . LORazepam (ATIVAN) 0.5 MG tablet Take 1 tablet (0.5 mg total) by mouth every 6 (six) hours as needed (Nausea or vomiting).  30 tablet  0  . ondansetron (ZOFRAN) 8 MG tablet Take 1 tablet (8 mg total) by mouth 2 (two) times daily. Start the day after chemo for 3 days. Then take as needed for nausea or vomiting.  30 tablet  1  . prochlorperazine (COMPAZINE) 10 MG tablet Take 1 tablet (10 mg total) by mouth every 6 (six) hours as needed (Nausea or vomiting).  30 tablet  1  . promethazine-dextromethorphan (PROMETHAZINE-DM) 6.25-15 MG/5ML syrup       . telmisartan-hydrochlorothiazide (MICARDIS HCT) 80-12.5 MG per tablet Take 0.5 tablets by mouth daily with lunch.        No current facility-administered medications for this visit.    REVIEW OF SYSTEMS:   Constitutional: Denies fevers, chills or abnormal weight loss, flushed in the face Eyes: Denies blurriness of vision Ears, nose, mouth, throat, and face: Denies mucositis or sore throat Respiratory: Denies cough, dyspnea or wheezes Cardiovascular: Denies palpitation, chest discomfort or lower extremity swelling Gastrointestinal:  Denies nausea, heartburn or change in bowel habits Skin: Denies abnormal skin rashes Lymphatics: Denies new lymphadenopathy or easy bruising Neurological:Denies numbness, tingling or new weaknesses Behavioral/Psych: Mood is stable, no new changes  Extremities: Left upper extremity edema noted by patient All other systems were reviewed with the patient and are negative.  PHYSICAL EXAMINATION: ECOG PERFORMANCE STATUS: 1 - Symptomatic but completely ambulatory  Filed Vitals:   12/10/13  0844  BP: 139/86  Pulse: 89  Temp: 98.2 F (36.8 C)  Resp: 18   Filed Weights   12/10/13 0844  Weight: 178 lb 9.6 oz (81.012 kg)    GENERAL:alert, no distress and comfortable SKIN: skin color, texture, turgor are normal, no rashes or significant lesions EYES: normal, Conjunctiva are pink and non-injected, sclera clear OROPHARYNX:no exudate, no erythema and lips, buccal mucosa, and tongue normal  NECK: supple, thyroid normal size, non-tender, without nodularity LYMPH:  no palpable lymphadenopathy in the cervical, axillary or inguinal LUNGS: clear to auscultation and percussion with normal breathing effort HEART: regular rate & rhythm and no murmurs and no lower extremity edema ABDOMEN:abdomen soft, non-tender and normal bowel sounds Musculoskeletal:no cyanosis of digits and no clubbing  NEURO: alert & oriented x 3 with fluent speech, no focal motor/sensory deficits Extremities: Swelling of the right upper extremity as well as her hand. No right upper extremity swelling no lower extremity swelling. Bilateral breast examination: Right breast no masses nipple discharge, left breast surgical scar from prior lumpectomy visible, barely palpable mass in the upper outer quadrant, left upper extremity lymphedema is noted LABORATORY DATA:  I have reviewed the data as listed   No results found for this basename: SPEP,  UPEP,   kappa and lambda  light chains    Lab Results  Component Value Date   WBC 10.3 12/10/2013   NEUTROABS 6.8* 12/10/2013   HGB 12.3 12/10/2013   HCT 36.5 12/10/2013   MCV 91.3 12/10/2013   PLT 269 12/10/2013      Chemistry      Component Value Date/Time   NA 142 12/03/2013 0811   K 3.8 12/03/2013 0811   CO2 22 12/03/2013 0811   BUN 10.7 12/03/2013 0811   CREATININE 0.7 12/03/2013 0811      Component Value Date/Time   CALCIUM 9.9 12/03/2013 0811   ALKPHOS 83 12/03/2013 0811   AST 27 12/03/2013 0811   ALT 91* 12/03/2013 0811   BILITOT 0.20 12/03/2013 0811        RADIOGRAPHIC STUDIES: I have personally reviewed the radiological images as listed and agreed with the findings in the report. No results found.    ASSESSMENT & PLAN:  53 year old female with  #1 stage II (T2 Nx) invasive ductal carcinoma of the left breast possibly recurrence. Patient had a prior history of ipsilateral breast cancer treated in [place]. Her new primary is ER positive PR positive HER-2 positive. Staging studies that were performed showed no evidence of metastatic disease distally.  #2 chemotherapy: Patient is status post cycle 2 of TCH/P. She's tolerated it well her blood counts look terrific.  #3 left upper extremity lymphedema: She was seen by the lymphedema clinic and they are recommending exercises.  #4 anemia: Anemia is resolved her hemoglobin today is 12.3  #5 liver function studies: Liver function tests are normal except for ALT elevated at 125 but it has been up and down. She is asymptomatic we will continue to follow  #6 grade 1 neuropathy: I have recommended beginning super B complex and Neurontin. Prescription was sent to her pharmacy.  #7 followup: Patient will be seen back in 2 weeks for cycle 3 day 1 of TCH/P.   No orders of the defined types were placed in this encounter.   All questions were answered. The patient knows to call the clinic with any problems, questions or concerns. No barriers to learning was detected. I spent 30 minutes counseling the patient face to face. The total time spent in the appointment was 30 minutes and more than 50% was on counseling and review of test results     Marcy Panning, MD 12/10/2013 9:00 AM

## 2013-12-10 NOTE — Patient Instructions (Signed)
We will contact you in 3 months to schedule your next appointment and echocardiogram  

## 2013-12-10 NOTE — Patient Instructions (Signed)
Begin neurontin for the possible early stage neuropathy  Take super B complex  Use sunscreen when outdoors  Neuropathic Pain We often think that pain has a physical cause. If we get rid of the cause, the pain should go away. Nerves themselves can also cause pain. It is called neuropathic pain, which means nerve abnormality. It may be difficult for the patients who have it and for the treating caregivers. Pain is usually described as acute (short-lived) or chronic (long-lasting). Acute pain is related to the physical sensations caused by an injury. It can last from a few seconds to many weeks, but it usually goes away when normal healing occurs. Chronic pain lasts beyond the typical healing time. With neuropathic pain, the nerve fibers themselves may be damaged or injured. They then send incorrect signals to other pain centers. The pain you feel is real, but the cause is not easy to find.  CAUSES  Chronic pain can result from diseases, such as diabetes and shingles (an infection related to chickenpox), or from trauma, surgery, or amputation. It can also happen without any known injury or disease. The nerves are sending pain messages, even though there is no identifiable cause for such messages.   Other common causes of neuropathy include diabetes, phantom limb pain, or Regional Pain Syndrome (RPS).  As with all forms of chronic back pain, if neuropathy is not correctly treated, there can be a number of associated problems that lead to a downward cycle for the patient. These include depression, sleeplessness, feelings of fear and anxiety, limited social interaction and inability to do normal daily activities or work.  The most dramatic and mysterious example of neuropathic pain is called "phantom limb syndrome." This occurs when an arm or a leg has been removed because of illness or injury. The brain still gets pain messages from the nerves that originally carried impulses from the missing limb. These  nerves now seem to misfire and cause troubling pain.  Neuropathic pain often seems to have no cause. It responds poorly to standard pain treatment. Neuropathic pain can occur after:  Shingles (herpes zoster virus infection).  A lasting burning sensation of the skin, caused usually by injury to a peripheral nerve.  Peripheral neuropathy which is widespread nerve damage, often caused by diabetes or alcoholism.  Phantom limb pain following an amputation.  Facial nerve problems (trigeminal neuralgia).  Multiple sclerosis.  Reflex sympathetic dystrophy.  Pain which comes with cancer and cancer chemotherapy.  Entrapment neuropathy such as when pressure is put on a nerve such as in carpal tunnel syndrome.  Back, leg, and hip problems (sciatica).  Spine or back surgery.  HIV Infection or AIDS where nerves are infected by viruses. Your caregiver can explain items in the above list which may apply to you. SYMPTOMS  Characteristics of neuropathic pain are:  Severe, sharp, electric shock-like, shooting, lightening-like, knife-like.  Pins and needles sensation.  Deep burning, deep cold, or deep ache.  Persistent numbness, tingling, or weakness.  Pain resulting from light touch or other stimulus that would not usually cause pain.  Increased sensitivity to something that would normally cause pain, such as a pinprick. Pain may persist for months or years following the healing of damaged tissues. When this happens, pain signals no longer sound an alarm about current injuries or injuries about to happen. Instead, the alarm system itself is not working correctly.  Neuropathic pain may get worse instead of better over time. For some people, it can lead to serious disability.  It is important to be aware that severe injury in a limb can occur without a proper, protective pain response.Burns, cuts, and other injuries may go unnoticed. Without proper treatment, these injuries can become infected  or lead to further disability. Take any injury seriously, and consult your caregiver for treatment. DIAGNOSIS  When you have a pain with no known cause, your caregiver will probably ask some specific questions:   Do you have any other conditions, such as diabetes, shingles, multiple sclerosis, or HIV infection?  How would you describe your pain? (Neuropathic pain is often described as shooting, stabbing, burning, or searing.)  Is your pain worse at any time of the day? (Neuropathic pain is usually worse at night.)  Does the pain seem to follow a certain physical pathway?  Does the pain come from an area that has missing or injured nerves? (An example would be phantom limb pain.)  Is the pain triggered by minor things such as rubbing against the sheets at night? These questions often help define the type of pain involved. Once your caregiver knows what is happening, treatment can begin. Anticonvulsant, antidepressant drugs, and various pain relievers seem to work in some cases. If another condition, such as diabetes is involved, better management of that disorder may relieve the neuropathic pain.  TREATMENT  Neuropathic pain is frequently long-lasting and tends not to respond to treatment with narcotic type pain medication. It may respond well to other drugs such as antiseizure and antidepressant medications. Usually, neuropathic problems do not completely go away, but partial improvement is often possible with proper treatment. Your caregivers have large numbers of medications available to treat you. Do not be discouraged if you do not get immediate relief. Sometimes different medications or a combination of medications will be tried before you receive the results you are hoping for. See your caregiver if you have pain that seems to be coming from nowhere and does not go away. Help is available.  SEEK IMMEDIATE MEDICAL CARE IF:   There is a sudden change in the quality of your pain, especially if  the change is on only one side of the body.  You notice changes of the skin, such as redness, black or purple discoloration, swelling, or an ulcer.  You cannot move the affected limbs. Document Released: 05/27/2004 Document Revised: 11/22/2011 Document Reviewed: 05/27/2004 [facility] Patient Information 2014 [place].

## 2013-12-12 ENCOUNTER — Ambulatory Visit: Payer: Federal, State, Local not specified - PPO | Attending: Oncology | Admitting: Physical Therapy

## 2013-12-12 DIAGNOSIS — C50419 Malignant neoplasm of upper-outer quadrant of unspecified female breast: Secondary | ICD-10-CM | POA: Insufficient documentation

## 2013-12-12 DIAGNOSIS — IMO0001 Reserved for inherently not codable concepts without codable children: Secondary | ICD-10-CM | POA: Diagnosis present

## 2013-12-12 DIAGNOSIS — I890 Lymphedema, not elsewhere classified: Secondary | ICD-10-CM | POA: Diagnosis not present

## 2013-12-14 ENCOUNTER — Telehealth: Payer: Self-pay | Admitting: Oncology

## 2013-12-14 ENCOUNTER — Telehealth: Payer: Self-pay | Admitting: *Deleted

## 2013-12-14 NOTE — Telephone Encounter (Signed)
Per staff message and POF I have scheduled appts.  JMW  

## 2013-12-14 NOTE — Telephone Encounter (Signed)
Mailed after appt letter to pt. 

## 2013-12-14 NOTE — Telephone Encounter (Signed)
, °

## 2013-12-17 ENCOUNTER — Ambulatory Visit: Payer: Federal, State, Local not specified - PPO | Admitting: Physical Therapy

## 2013-12-17 ENCOUNTER — Encounter: Payer: Federal, State, Local not specified - PPO | Admitting: Physical Therapy

## 2013-12-17 DIAGNOSIS — IMO0001 Reserved for inherently not codable concepts without codable children: Secondary | ICD-10-CM | POA: Diagnosis not present

## 2013-12-19 ENCOUNTER — Ambulatory Visit: Payer: Federal, State, Local not specified - PPO

## 2013-12-19 DIAGNOSIS — IMO0001 Reserved for inherently not codable concepts without codable children: Secondary | ICD-10-CM | POA: Diagnosis not present

## 2013-12-21 ENCOUNTER — Encounter: Payer: Federal, State, Local not specified - PPO | Admitting: Physical Therapy

## 2013-12-23 NOTE — Progress Notes (Signed)
Hematology and Oncology Follow Up Visit  Kelsey Mclaughlin [id number] 1961/06/07 53 y.o. 12/24/2013 9:27 AM     Principle Diagnosis:Kelsey Mclaughlin 53 y.o. female with recurrent stage IIA triple positive invasive ductal carcinoma of the left breast.     Prior Therapy:  1. In 1998 at the age of 84 was diagnosed with left breast cancer in [place]. This was an invasive carcinoma grade 2 stage II. At that time she underwent a lumpectomy with excellent lymph node dissection. She received 6 months of chemotherapy. [name] She also had radiation therapy to the left breast. 2011 patient noticed a lump in the outside of her left breast. She had ultrasound workup performed and was told it was negative no biopsies were performed. In January 2014 after patient moved to the [place] she had a mammogram performed this revealed a possible mass in the outer quadrant of the left breast. Ultrasound showed it to be 1.7 cm. MRI of the breasts performed on January 30 revealed the mass to be 1.5 x 2.2 x 1.5 cm. Because of this she also had a biopsy performed on 10/04/2013. The biopsy revealed an invasive ductal carcinoma with ductal carcinoma in situ grade 2. Prognostic panel was positive for estrogen receptor 100% progesterone receptor +33% proliferation marker Ki-67 15% and the tumor was HER-2/neu positive.   2. staging scans were performed on 11/01/2013 consisting of PET scan and CT scans and demonstrated no evidence of metastases.    3. The patient was started on neoadjuvant chemotherapy consisting of Taxotere, carboplatin, Herceptin, and Perjeta beginning 11/12/13 every 21 days.  A total of  6 cycles are planned.   Current therapy:  Taxotere, Carboplatin, Herceptin and Perjeta cycle 3 day 1  Interim History: Kelsey Mclaughlin 53 y.o. female with a stage IIA triple positive invasive ductal carcinoma of the left breast.  She is here for  neoadjuvant treatment with Taxotere, Carboplatin, Herceptin, and Perjeta given on day 1 of a 21 day cycle with Neulasta support given on day 2.  She is here for cycle 3 day 1.  She speaks [place] and is here today with her spouse [name] who speaks English and is helping the patient translate.   She is c/o dysuria and urinary hesitancy today that started about 5 days ago.  She is drinking cranberry juice and denies fevers, hematuria.  She does report a foam in her urine.  Her spouse [name] is concerned about her blood pressure.  She normally takes 0.5tab of Micardis HCT, but was instructed not to take it during her chemotherapy treatment.  So she hasn't been taking it.  Her blood pressure today is 155/89.  She would like to monitor it for now due to the HCT component.  She does have neuropathy in her fingertips that she takes Gabapentin 157m TID for.  It is much improved since starting it and she denies any difficulty with her activities of daily living due to the neuropathy. She is wearing a glove for her left arm lymphedema.  She and her spouse feel that it is getting better and she continues to go to the lymphedema clinic three times a week for this.  She [place] She tells me that her face and skin is flushed which occurs typically when she takes the Dexamethasone the day prior to treatment.  Otherwise, she denies  fevers, chills, nausea, vomiting, constipation, diarrhea, or any further concerns.    Medications:  Current Outpatient Prescriptions  Medication Sig Dispense Refill  . dexamethasone (DECADRON) 4 MG tablet       . gabapentin (NEURONTIN) 100 MG capsule Take 1 capsule (100 mg total) by mouth 3 (three) times daily.  90 capsule  4  . lidocaine-prilocaine (EMLA) cream Apply to port-a-cath 1 - 2 hours prior to being accessed.  30 g  3  . LORazepam (ATIVAN) 0.5 MG tablet Take 1 tablet (0.5 mg total) by mouth every 6 (six)  hours as needed (Nausea or vomiting).  30 tablet  3  . ondansetron (ZOFRAN) 8 MG tablet Take 1 tablet (8 mg total) by mouth 2 (two) times daily. Start the day after chemo for 3 days. Then take as needed for nausea or vomiting.  30 tablet  1  . cephALEXin (KEFLEX) 250 MG capsule Take 1 capsule (250 mg total) by mouth 4 (four) times daily.  28 capsule  0  . HYDROcodone-acetaminophen (NORCO) 10-325 MG per tablet       . telmisartan-hydrochlorothiazide (MICARDIS HCT) 80-12.5 MG per tablet Take 0.5 tablets by mouth daily with lunch.        No current facility-administered medications for this visit.     Allergies: No Known Allergies  Medical History: Past Medical History  Diagnosis Date  . Cancer 1998, 2015    breast  . Hypertension   . Bronchitis     completed antibiotics 10/21/13/ states resolved  . Stroke 2004    TIA-  no problems since    Surgical History:  Past Surgical History  Procedure Laterality Date  . Breast surgery  1998    lumpectomy on left and removed lymphnodes in [place]  . Portacath placement Right 10/25/2013    Procedure: INSERTION PORT-A-CATH;  Surgeon: Imogene Burn. Tsuei, MD;  Location: [facility];  Service: General;  Laterality: Right;     Review of Systems: A 10 point review of systems was conducted and is otherwise negative except for what is noted above.     Physical Exam: Blood pressure 155/89, pulse 97, temperature 98 F (36.7 C), temperature source Oral, resp. rate 18, height $RemoveBe'5\' 7"'FmaYsmKqX$  (1.702 m), weight 186 lb 11.2 oz (84.687 kg). GENERAL: Patient is a well appearing female in no acute distress HEENT:  Sclerae anicteric.  Oropharynx clear and moist. No ulcerations or evidence of oropharyngeal candidiasis. Neck is supple.  NODES:  No cervical, supraclavicular, or axillary lymphadenopathy palpated.  BREAST EXAM:  Soft mass palpated in the left upper outer quadrant.   LUNGS:  Clear to auscultation bilaterally.  No wheezes or rhonchi. HEART:  Regular rate and  rhythm. No murmur appreciated. ABDOMEN:  Soft, nontender.  Positive, normoactive bowel sounds. No organomegaly palpated. MSK:  No focal spinal tenderness to palpation. Full range of motion bilaterally in the upper extremities. EXTREMITIES:  No peripheral edema.   SKIN:  Clear with no obvious rashes or skin changes. No nail dyscrasia. NEURO:  Nonfocal. Well oriented.  Appropriate affect. ECOG PERFORMANCE STATUS: 1 - Symptomatic but completely ambulatory   Lab Results: Lab Results  Component Value Date   WBC 15.7* 12/24/2013   HGB 11.0* 12/24/2013   HCT 33.4* 12/24/2013   MCV 94.4 12/24/2013   PLT 303 12/24/2013     Chemistry      Component Value Date/Time   NA 138 12/10/2013 0835   K 4.2 12/10/2013 0835   CO2 24 12/10/2013 0835   BUN  14.9 12/10/2013 0835   CREATININE 0.7 12/10/2013 0835      Component Value Date/Time   CALCIUM 9.5 12/10/2013 0835   ALKPHOS 105 12/10/2013 0835   AST 31 12/10/2013 0835   ALT 125* 12/10/2013 0835   BILITOT 0.28 12/10/2013 0835      Assessment and Plan: Kelsey Mclaughlin 53 y.o. female with  1. Recurrent clinical stage IIA triple positive invasive ductal carcinoma of the left breast.  Patient is undergoing neoadjuvant chemotherapy with Taxotere, Carboplatin, Herceptin, and Perjeta on day 1 of a 21 day cycle with Neulasta on day 2.  She is tolerating treatment moderately well.  Her CBC is stable.  She will proceed with cycle 3 day 1 of chemotherapy today.  Her CMP is pending.  I talked to the patient about her trip to [place].  I recommended before any travel arrangements were made that she get a copy of her medical records from [place] to have in case she travels.    2. Lymphedema.  The patient has suffered from this for 6 years.  Patient will continue going to the lymphedema clinic three times per week and wearing her glove and sleeve.  She feels like it is improving.   3. Neuropathy.  Patient is taking Gabapentin 172m TID and is tolerating it well.  Her  neuropathy is improved with this regimen.  I recommended she continue it.    4. Cardiac.  The patient underwent an echocardiogram on 11/01/2013 that demonstrated a LVEF of 56%.  She was evaluated by Dr. MAundra Dubinwho reviewed her echo and cleared her to continue Herceptin.  He recommended she return to their clinic with a repeat echocardiogram in 3 months time. Her blood pressure is mildly elevated.  We will follow this closely and may likely need to restart her Micardis HCT in the near future.    5. Dysuria.  This is highly suspicious for a UTI.  I have added a urinalysis and culture and prescribed Cipro BID for her to take for 7 days.    6. Hepatic steatosis.  Patient with mildly elevate liver enzymes and her CT of the abdomen and pelvis with her staging studies was consistent with hepatic steatosis.  I gave her and her spouse information regarding this in her AVS.    The patient will return tomorrow for neulasta and in one week for labs and evaluation for chemotoxicities.  She and her spouse know to contact [place] for any questions or concerns.  We can certainly see her sooner if needed.   I spent 25 minutes counseling the patient face to face.  The total time spent in the appointment was 30 minutes.  LMinette Headland NBreckinridge [phone]/13/2015 9:27 AM

## 2013-12-24 ENCOUNTER — Ambulatory Visit (HOSPITAL_BASED_OUTPATIENT_CLINIC_OR_DEPARTMENT_OTHER): Payer: Federal, State, Local not specified - PPO

## 2013-12-24 ENCOUNTER — Telehealth: Payer: Self-pay | Admitting: Adult Health

## 2013-12-24 ENCOUNTER — Encounter: Payer: Self-pay | Admitting: Adult Health

## 2013-12-24 ENCOUNTER — Other Ambulatory Visit (HOSPITAL_BASED_OUTPATIENT_CLINIC_OR_DEPARTMENT_OTHER): Payer: Federal, State, Local not specified - PPO

## 2013-12-24 ENCOUNTER — Ambulatory Visit (HOSPITAL_BASED_OUTPATIENT_CLINIC_OR_DEPARTMENT_OTHER): Payer: Federal, State, Local not specified - PPO | Admitting: Adult Health

## 2013-12-24 ENCOUNTER — Ambulatory Visit: Payer: Federal, State, Local not specified - PPO | Admitting: Physical Therapy

## 2013-12-24 ENCOUNTER — Encounter: Payer: Self-pay | Admitting: Oncology

## 2013-12-24 VITALS — BP 155/89 | HR 97 | Temp 98.0°F | Resp 18 | Ht 67.0 in | Wt 186.7 lb

## 2013-12-24 DIAGNOSIS — C50419 Malignant neoplasm of upper-outer quadrant of unspecified female breast: Secondary | ICD-10-CM

## 2013-12-24 DIAGNOSIS — Z5111 Encounter for antineoplastic chemotherapy: Secondary | ICD-10-CM

## 2013-12-24 DIAGNOSIS — R300 Dysuria: Secondary | ICD-10-CM

## 2013-12-24 DIAGNOSIS — Z5112 Encounter for antineoplastic immunotherapy: Secondary | ICD-10-CM

## 2013-12-24 DIAGNOSIS — C50412 Malignant neoplasm of upper-outer quadrant of left female breast: Secondary | ICD-10-CM

## 2013-12-24 DIAGNOSIS — I890 Lymphedema, not elsewhere classified: Secondary | ICD-10-CM

## 2013-12-24 DIAGNOSIS — IMO0001 Reserved for inherently not codable concepts without codable children: Secondary | ICD-10-CM | POA: Diagnosis not present

## 2013-12-24 DIAGNOSIS — G609 Hereditary and idiopathic neuropathy, unspecified: Secondary | ICD-10-CM

## 2013-12-24 LAB — CBC WITH DIFFERENTIAL/PLATELET
BASO%: 0.1 % (ref 0.0–2.0)
Basophils Absolute: 0 10*3/uL (ref 0.0–0.1)
EOS%: 0 % (ref 0.0–7.0)
Eosinophils Absolute: 0 10*3/uL (ref 0.0–0.5)
HCT: 33.4 % — ABNORMAL LOW (ref 34.8–46.6)
HGB: 11 g/dL — ABNORMAL LOW (ref 11.6–15.9)
LYMPH%: 4.9 % — ABNORMAL LOW (ref 14.0–49.7)
MCH: 31.1 pg (ref 25.1–34.0)
MCHC: 32.9 g/dL (ref 31.5–36.0)
MCV: 94.4 fL (ref 79.5–101.0)
MONO#: 0.3 10*3/uL (ref 0.1–0.9)
MONO%: 1.9 % (ref 0.0–14.0)
NEUT#: 14.7 10*3/uL — ABNORMAL HIGH (ref 1.5–6.5)
NEUT%: 93.1 % — ABNORMAL HIGH (ref 38.4–76.8)
Platelets: 303 10*3/uL (ref 145–400)
RBC: 3.54 10*6/uL — ABNORMAL LOW (ref 3.70–5.45)
RDW: 16.4 % — ABNORMAL HIGH (ref 11.2–14.5)
WBC: 15.7 10*3/uL — ABNORMAL HIGH (ref 3.9–10.3)
lymph#: 0.8 10*3/uL — ABNORMAL LOW (ref 0.9–3.3)

## 2013-12-24 LAB — COMPREHENSIVE METABOLIC PANEL (CC13)
ALT: 146 U/L — ABNORMAL HIGH (ref 0–55)
ANION GAP: 12 meq/L — AB (ref 3–11)
AST: 68 U/L — AB (ref 5–34)
Albumin: 3.9 g/dL (ref 3.5–5.0)
Alkaline Phosphatase: 88 U/L (ref 40–150)
BUN: 9.2 mg/dL (ref 7.0–26.0)
CALCIUM: 9.7 mg/dL (ref 8.4–10.4)
CO2: 20 meq/L — AB (ref 22–29)
CREATININE: 0.7 mg/dL (ref 0.6–1.1)
Chloride: 109 mEq/L (ref 98–109)
Glucose: 192 mg/dl — ABNORMAL HIGH (ref 70–140)
Potassium: 3.7 mEq/L (ref 3.5–5.1)
Sodium: 141 mEq/L (ref 136–145)
Total Bilirubin: 0.21 mg/dL (ref 0.20–1.20)
Total Protein: 6.9 g/dL (ref 6.4–8.3)

## 2013-12-24 MED ORDER — DIPHENHYDRAMINE HCL 25 MG PO CAPS
50.0000 mg | ORAL_CAPSULE | Freq: Once | ORAL | Status: AC
  Administered 2013-12-24: 50 mg via ORAL

## 2013-12-24 MED ORDER — SODIUM CHLORIDE 0.9 % IV SOLN
Freq: Once | INTRAVENOUS | Status: AC
  Administered 2013-12-24: 10:00:00 via INTRAVENOUS

## 2013-12-24 MED ORDER — ONDANSETRON 16 MG/50ML IVPB (CHCC)
INTRAVENOUS | Status: AC
  Filled 2013-12-24: qty 16

## 2013-12-24 MED ORDER — DEXAMETHASONE SODIUM PHOSPHATE 20 MG/5ML IJ SOLN
20.0000 mg | Freq: Once | INTRAMUSCULAR | Status: AC
  Administered 2013-12-24: 20 mg via INTRAVENOUS

## 2013-12-24 MED ORDER — TRASTUZUMAB CHEMO INJECTION 440 MG
6.0000 mg/kg | Freq: Once | INTRAVENOUS | Status: AC
  Administered 2013-12-24: 483 mg via INTRAVENOUS
  Filled 2013-12-24: qty 23

## 2013-12-24 MED ORDER — SODIUM CHLORIDE 0.9 % IV SOLN
75.0000 mg/m2 | Freq: Once | INTRAVENOUS | Status: AC
  Administered 2013-12-24: 150 mg via INTRAVENOUS
  Filled 2013-12-24: qty 15

## 2013-12-24 MED ORDER — HEPARIN SOD (PORK) LOCK FLUSH 100 UNIT/ML IV SOLN
500.0000 [IU] | Freq: Once | INTRAVENOUS | Status: AC | PRN
  Administered 2013-12-24: 500 [IU]
  Filled 2013-12-24: qty 5

## 2013-12-24 MED ORDER — SODIUM CHLORIDE 0.9 % IJ SOLN
10.0000 mL | INTRAMUSCULAR | Status: DC | PRN
  Administered 2013-12-24: 10 mL
  Filled 2013-12-24: qty 10

## 2013-12-24 MED ORDER — DIPHENHYDRAMINE HCL 25 MG PO CAPS
ORAL_CAPSULE | ORAL | Status: AC
  Filled 2013-12-24: qty 2

## 2013-12-24 MED ORDER — ACETAMINOPHEN 325 MG PO TABS
ORAL_TABLET | ORAL | Status: AC
  Filled 2013-12-24: qty 2

## 2013-12-24 MED ORDER — SODIUM CHLORIDE 0.9 % IV SOLN
771.0000 mg | Freq: Once | INTRAVENOUS | Status: AC
  Administered 2013-12-24: 770 mg via INTRAVENOUS
  Filled 2013-12-24: qty 77

## 2013-12-24 MED ORDER — CIPROFLOXACIN HCL 500 MG PO TABS: 500.0000 mg | ORAL_TABLET | Freq: Two times a day (BID) | ORAL | Status: DC

## 2013-12-24 MED ORDER — ACETAMINOPHEN 325 MG PO TABS
650.0000 mg | ORAL_TABLET | Freq: Once | ORAL | Status: AC
  Administered 2013-12-24: 650 mg via ORAL

## 2013-12-24 MED ORDER — SODIUM CHLORIDE 0.9 % IV SOLN
420.0000 mg | Freq: Once | INTRAVENOUS | Status: AC
  Administered 2013-12-24: 420 mg via INTRAVENOUS
  Filled 2013-12-24: qty 14

## 2013-12-24 MED ORDER — ONDANSETRON 16 MG/50ML IVPB (CHCC)
16.0000 mg | Freq: Once | INTRAVENOUS | Status: AC
  Administered 2013-12-24: 16 mg via INTRAVENOUS

## 2013-12-24 MED ORDER — DEXAMETHASONE SODIUM PHOSPHATE 20 MG/5ML IJ SOLN
INTRAMUSCULAR | Status: AC
  Filled 2013-12-24: qty 5

## 2013-12-24 NOTE — Patient Instructions (Signed)
Fatty Liver °Fatty liver is the accumulation of fat in liver cells. It is also called hepatosteatosis or steatohepatitis. It is normal for your liver to contain some fat. If fat is more than 5 to 10% of your liver's weight, you have fatty liver.  °There are often no symptoms (problems) for years while damage is still occurring. People often learn about their fatty liver when they have medical tests for other reasons. Fat can damage your liver for years or even decades without causing problems. When it becomes severe, it can cause fatigue, weight loss, weakness, and confusion. °This makes you more likely to develop more serious liver problems. The liver is the largest organ in the body. It does a lot of work and often gives no warning signs when it is sick until late in a disease. °The liver has many important jobs including: °· Breaking down foods. °· Storing vitamins, iron, and other minerals. °· Making proteins. °· Making bile for food digestion. °· Breaking down many products including medications, alcohol and some poisons. °CAUSES  °There are a number of different conditions, medications, and poisons that can cause a fatty liver. Eating too many calories causes fat to build up in the liver. Not processing and breaking fats down normally may also cause this. Certain conditions, such as obesity, diabetes, and high triglycerides also cause this. Most fatty liver patients tend to be middle-aged and over weight.  °Some causes of fatty liver are: °· Alcohol over consumption. °· Malnutrition. °· Steroid use. °· Valproic acid toxicity. °· Obesity. °· Cushing's syndrome. °· Poisons. °· Tetracycline in high dosages. °· Pregnancy. °· Diabetes. °· Hyperlipidemia. °· Rapid weight loss. °Some people develop fatty liver even having none of these conditions. °SYMPTOMS  °Fatty liver most often causes no problems. This is called asymptomatic. °· It can be diagnosed with blood tests and also by a liver biopsy. °· It is one of the  most common causes of minor elevations of liver enzymes on routine blood tests. °· Specialized Imaging of the liver using ultrasound, CT (computed tomography) scan, or MRI (magnetic resonance imaging) can suggest a fatty liver but a biopsy is needed to confirm it. °· A biopsy involves taking a small sample of liver tissue. This is done by using a needle. It is then looked at under a microscope by a specialist. °TREATMENT  °It is important to treat the cause. Simple fatty liver without a medical reason may not need treatment. °· Weight loss, fat restriction, and exercise in overweight patients produces inconsistent results but is worth trying. °· Fatty liver due to alcohol toxicity may not improve even with stopping drinking. °· Good control of diabetes may reduce fatty liver. °· Lower your triglycerides through diet, medication or both. °· Eat a balanced, healthy diet. °· Increase your physical activity. °· Get regular checkups from a liver specialist. °· There are no medical or surgical treatments for a fatty liver or NASH, but improving your diet and increasing your exercise may help prevent or reverse some of the damage. °PROGNOSIS  °Fatty liver may cause no damage or it can lead to an inflammation of the liver. This is, called steatohepatitis. When it is linked to alcohol abuse, it is called alcoholic steatohepatitis. It often is not linked to alcohol. It is then called nonalcoholic steatohepatitis, or NASH. Over time the liver may become scarred and hardened. This condition is called cirrhosis. Cirrhosis is serious and may lead to liver failure or cancer. NASH is one of the   leading causes of cirrhosis. About 10-20% of Americans have fatty liver and a smaller 2-5% has NASH. °Document Released: 10/15/2005 Document Revised: 11/22/2011 Document Reviewed: 12/08/2005 °ExitCare® Patient Information ©2014 [employer]. ° °

## 2013-12-24 NOTE — Telephone Encounter (Signed)
per pof sch appt for 4/21-gave pt copy of new sch-pt understood

## 2013-12-24 NOTE — Patient Instructions (Signed)
[facility] Discharge Instructions for Patients Receiving Chemotherapy  Today you received the following chemotherapy agents herceptin, carboplatin, taxotere, carboplatin  To help prevent nausea and vomiting after your treatment, we encourage you to take your nausea medication as needed   If you develop nausea and vomiting that is not controlled by your nausea medication, call the clinic.   BELOW ARE SYMPTOMS THAT SHOULD BE REPORTED IMMEDIATELY:  *FEVER GREATER THAN 100.5 F  *CHILLS WITH OR WITHOUT FEVER  NAUSEA AND VOMITING THAT IS NOT CONTROLLED WITH YOUR NAUSEA MEDICATION  *UNUSUAL SHORTNESS OF BREATH  *UNUSUAL BRUISING OR BLEEDING  TENDERNESS IN MOUTH AND THROAT WITH OR WITHOUT PRESENCE OF ULCERS  *URINARY PROBLEMS  *BOWEL PROBLEMS  UNUSUAL RASH Items with * indicate a potential emergency and should be followed up as soon as possible.  Feel free to call the clinic you have any questions or concerns. The clinic phone number is [phone].

## 2013-12-25 ENCOUNTER — Encounter: Payer: Self-pay | Admitting: Oncology

## 2013-12-25 ENCOUNTER — Ambulatory Visit (HOSPITAL_BASED_OUTPATIENT_CLINIC_OR_DEPARTMENT_OTHER): Payer: Federal, State, Local not specified - PPO

## 2013-12-25 VITALS — BP 131/62 | HR 67 | Temp 97.9°F

## 2013-12-25 DIAGNOSIS — Z5189 Encounter for other specified aftercare: Secondary | ICD-10-CM

## 2013-12-25 DIAGNOSIS — C50419 Malignant neoplasm of upper-outer quadrant of unspecified female breast: Secondary | ICD-10-CM

## 2013-12-25 DIAGNOSIS — C50412 Malignant neoplasm of upper-outer quadrant of left female breast: Secondary | ICD-10-CM

## 2013-12-25 MED ORDER — PEGFILGRASTIM INJECTION 6 MG/0.6ML
6.0000 mg | Freq: Once | SUBCUTANEOUS | Status: AC
  Administered 2013-12-25: 6 mg via SUBCUTANEOUS
  Filled 2013-12-25: qty 0.6

## 2013-12-25 NOTE — Progress Notes (Signed)
Pt is approved for Herceptin and Perjeta thru [place].  Pt's effective dates are 11/10/13 - 11/09/14.  The amount of the grant is $24,000 each.  I forwarded a copy of letter to [place] in billing.

## 2013-12-26 ENCOUNTER — Ambulatory Visit: Payer: Federal, State, Local not specified - PPO

## 2013-12-26 DIAGNOSIS — IMO0001 Reserved for inherently not codable concepts without codable children: Secondary | ICD-10-CM | POA: Diagnosis not present

## 2013-12-28 ENCOUNTER — Ambulatory Visit: Payer: Federal, State, Local not specified - PPO | Admitting: Physical Therapy

## 2013-12-31 ENCOUNTER — Encounter: Payer: Federal, State, Local not specified - PPO | Admitting: Physical Therapy

## 2014-01-01 ENCOUNTER — Telehealth: Payer: Self-pay | Admitting: Adult Health

## 2014-01-01 ENCOUNTER — Other Ambulatory Visit (HOSPITAL_BASED_OUTPATIENT_CLINIC_OR_DEPARTMENT_OTHER): Payer: Federal, State, Local not specified - PPO

## 2014-01-01 ENCOUNTER — Ambulatory Visit (HOSPITAL_BASED_OUTPATIENT_CLINIC_OR_DEPARTMENT_OTHER): Payer: Federal, State, Local not specified - PPO | Admitting: Adult Health

## 2014-01-01 VITALS — BP 149/90 | HR 69 | Temp 98.4°F | Resp 18 | Ht 67.0 in | Wt 182.4 lb

## 2014-01-01 DIAGNOSIS — Z170 Estrogen receptor positive status [ER+]: Secondary | ICD-10-CM

## 2014-01-01 DIAGNOSIS — K7689 Other specified diseases of liver: Secondary | ICD-10-CM

## 2014-01-01 DIAGNOSIS — C50412 Malignant neoplasm of upper-outer quadrant of left female breast: Secondary | ICD-10-CM

## 2014-01-01 DIAGNOSIS — I890 Lymphedema, not elsewhere classified: Secondary | ICD-10-CM

## 2014-01-01 DIAGNOSIS — C50419 Malignant neoplasm of upper-outer quadrant of unspecified female breast: Secondary | ICD-10-CM

## 2014-01-01 DIAGNOSIS — G589 Mononeuropathy, unspecified: Secondary | ICD-10-CM

## 2014-01-01 DIAGNOSIS — R300 Dysuria: Secondary | ICD-10-CM

## 2014-01-01 LAB — COMPREHENSIVE METABOLIC PANEL (CC13)
ALK PHOS: 130 U/L (ref 40–150)
ALT: 136 U/L — ABNORMAL HIGH (ref 0–55)
AST: 31 U/L (ref 5–34)
Albumin: 4 g/dL (ref 3.5–5.0)
Anion Gap: 11 mEq/L (ref 3–11)
BILIRUBIN TOTAL: 0.21 mg/dL (ref 0.20–1.20)
BUN: 15.4 mg/dL (ref 7.0–26.0)
CO2: 23 mEq/L (ref 22–29)
Calcium: 9.8 mg/dL (ref 8.4–10.4)
Chloride: 105 mEq/L (ref 98–109)
Creatinine: 0.7 mg/dL (ref 0.6–1.1)
GLUCOSE: 110 mg/dL (ref 70–140)
Potassium: 4.1 mEq/L (ref 3.5–5.1)
SODIUM: 139 meq/L (ref 136–145)
Total Protein: 6.8 g/dL (ref 6.4–8.3)

## 2014-01-01 LAB — CBC WITH DIFFERENTIAL/PLATELET
BASO%: 0.3 % (ref 0.0–2.0)
Basophils Absolute: 0.1 10*3/uL (ref 0.0–0.1)
EOS ABS: 0 10*3/uL (ref 0.0–0.5)
EOS%: 0 % (ref 0.0–7.0)
HCT: 33.9 % — ABNORMAL LOW (ref 34.8–46.6)
HGB: 11.2 g/dL — ABNORMAL LOW (ref 11.6–15.9)
LYMPH%: 6 % — AB (ref 14.0–49.7)
MCH: 31.2 pg (ref 25.1–34.0)
MCHC: 33.1 g/dL (ref 31.5–36.0)
MCV: 94.4 fL (ref 79.5–101.0)
MONO#: 1.9 10*3/uL — ABNORMAL HIGH (ref 0.1–0.9)
MONO%: 6.4 % (ref 0.0–14.0)
NEUT%: 87.3 % — ABNORMAL HIGH (ref 38.4–76.8)
NEUTROS ABS: 25.4 10*3/uL — AB (ref 1.5–6.5)
PLATELETS: 292 10*3/uL (ref 145–400)
RBC: 3.59 10*6/uL — AB (ref 3.70–5.45)
RDW: 16.7 % — AB (ref 11.2–14.5)
WBC: 29.1 10*3/uL — ABNORMAL HIGH (ref 3.9–10.3)
lymph#: 1.7 10*3/uL (ref 0.9–3.3)

## 2014-01-01 LAB — URINALYSIS, MICROSCOPIC - CHCC
BILIRUBIN (URINE): NEGATIVE
Glucose: NEGATIVE mg/dL
Ketones: NEGATIVE mg/dL
LEUKOCYTE ESTERASE: NEGATIVE
Nitrite: NEGATIVE
Protein: NEGATIVE mg/dL
Specific Gravity, Urine: 1.005 (ref 1.003–1.035)
UROBILINOGEN UR: 0.2 mg/dL (ref 0.2–1)
pH: 6 (ref 4.6–8.0)

## 2014-01-01 NOTE — Progress Notes (Signed)
Hematology and Oncology Follow Up Visit  Kelsey Mclaughlin [id number] Mar 12, 1961 53 y.o. 01/02/2014 11:49 PM     Principle Diagnosis:Kelsey Mclaughlin 53 y.o. female with recurrent stage IIA triple positive invasive ductal carcinoma of the left breast.     Prior Therapy:  1. In 1998 at the age of 42 was diagnosed with left breast cancer in [place]. This was an invasive carcinoma grade 2 stage II. At that time she underwent a lumpectomy with excellent lymph node dissection. She received 6 months of chemotherapy. [name] She also had radiation therapy to the left breast. 2011 patient noticed a lump in the outside of her left breast. She had ultrasound workup performed and was told it was negative no biopsies were performed. In January 2014 after patient moved to the [place] she had a mammogram performed this revealed a possible mass in the outer quadrant of the left breast. Ultrasound showed it to be 1.7 cm. MRI of the breasts performed on January 30 revealed the mass to be 1.5 x 2.2 x 1.5 cm. Because of this she also had a biopsy performed on 10/04/2013. The biopsy revealed an invasive ductal carcinoma with ductal carcinoma in situ grade 2. Prognostic panel was positive for estrogen receptor 100% progesterone receptor +33% proliferation marker Ki-67 15% and the tumor was HER-2/neu positive.   2. staging scans were performed on 11/01/2013 consisting of PET scan and CT scans and demonstrated no evidence of metastases.   3. The patient was started on neoadjuvant chemotherapy consisting of Taxotere, carboplatin, Herceptin, and Perjeta beginning 11/12/13 every 21 days. A total of 6 cycles are planned.    Current therapy: Taxotere, carboplatin, herceptin perjeta cycle 3 day 8  Interim History: Kelsey Mclaughlin 53 y.o. female with stage IIA triple positive invasive ductal carcinoma of the left breast.  She is currently  receiving Taxotere, Carboplatin, Herceptin, and Perjeta given on day 1 of a 21 day cycle and Neulasta support given on day 2.  She receives this in the neoadjuvant setting.  She is here for cycle 3 day 8.  She does speak [place], and is accompanied today with her spouse, [name] who speaks [place].    She is doing moderately well today.  She does have a left eye twitch and is not sleeping very well.  She notices that when she takes Lorazepam for nausea in the evening she gets a better nights sleep.  She was treated last week for a UTI due to dysuria and urinary hesitancy.  She was prescribed Cipro BID and tolerated those well.  Her dysuria and hesitancy has stopped.  She has had occasional nausea that is relieved with her antiemetics.  She does have numbness in the fingertips.  She is taking Gabapentin TID and is tolerating it well.  The neuropathy is not interfering with her activities of daily living.  She continues to follow up with the lymphedema clinic regarding her left arm lymphedema.  Otherwise, she is doing well and a 10 point ROS is neg.   Medications:  Current Outpatient Prescriptions  Medication Sig Dispense Refill  . gabapentin (NEURONTIN) 100 MG capsule Take 1 capsule (100 mg total) by mouth 3 (three) times daily.  90 capsule  4  . dexamethasone (DECADRON) 4 MG tablet       . HYDROcodone-acetaminophen (NORCO) 10-325 MG per tablet       . lidocaine-prilocaine (EMLA) cream Apply to port-a-cath 1 - 2  hours prior to being accessed.  30 g  3  . LORazepam (ATIVAN) 0.5 MG tablet Take 1 tablet (0.5 mg total) by mouth every 6 (six) hours as needed (Nausea or vomiting).  30 tablet  3  . ondansetron (ZOFRAN) 8 MG tablet Take 1 tablet (8 mg total) by mouth 2 (two) times daily. Start the day after chemo for 3 days. Then take as needed for nausea or vomiting.  30 tablet  1  . prochlorperazine (COMPAZINE) 10 MG tablet       . telmisartan-hydrochlorothiazide (MICARDIS HCT) 80-12.5 MG per tablet Take 0.5  tablets by mouth daily with lunch.        No current facility-administered medications for this visit.     Allergies: No Known Allergies  Medical History: Past Medical History  Diagnosis Date  . Cancer 1998, 2015    breast  . Hypertension   . Bronchitis     completed antibiotics 10/21/13/ states resolved  . Stroke 2004    TIA-  no problems since    Surgical History:  Past Surgical History  Procedure Laterality Date  . Breast surgery  1998    lumpectomy on left and removed lymphnodes in [place]  . Portacath placement Right 10/25/2013    Procedure: INSERTION PORT-A-CATH;  Surgeon: Imogene Burn. Tsuei, MD;  Location: [facility];  Service: General;  Laterality: Right;     Review of Systems: A 10 point review of systems was conducted and is otherwise negative except for what is noted above.     Physical Exam: Blood pressure 149/90, pulse 69, temperature 98.4 F (36.9 C), temperature source Oral, resp. rate 18, height '5\' 7"'  (1.702 m), weight 182 lb 6.4 oz (82.736 kg). GENERAL: Patient is a well appearing female in no acute distress HEENT:  Sclerae anicteric.  Oropharynx clear and moist. No ulcerations or evidence of oropharyngeal candidiasis. Neck is supple.  NODES:  No cervical, supraclavicular, or axillary lymphadenopathy palpated.  BREAST EXAM:  Deferred. LUNGS:  Clear to auscultation bilaterally.  No wheezes or rhonchi. HEART:  Regular rate and rhythm. No murmur appreciated. ABDOMEN:  Soft, nontender.  Positive, normoactive bowel sounds. No organomegaly palpated. MSK:  No focal spinal tenderness to palpation. Full range of motion bilaterally in the upper extremities. EXTREMITIES:  No peripheral edema.  Left arm swelling SKIN:  Clear with no obvious rashes or skin changes. No nail dyscrasia. NEURO:  Nonfocal. Well oriented.  Appropriate affect. ECOG PERFORMANCE STATUS: 1 - Symptomatic but completely ambulatory   Lab Results: Lab Results  Component Value Date   WBC 29.1*  01/01/2014   HGB 11.2* 01/01/2014   HCT 33.9* 01/01/2014   MCV 94.4 01/01/2014   PLT 292 01/01/2014     Chemistry      Component Value Date/Time   NA 139 01/01/2014 0947   K 4.1 01/01/2014 0947   CO2 23 01/01/2014 0947   BUN 15.4 01/01/2014 0947   CREATININE 0.7 01/01/2014 0947      Component Value Date/Time   CALCIUM 9.8 01/01/2014 0947   ALKPHOS 130 01/01/2014 0947   AST 31 01/01/2014 0947   ALT 136* 01/01/2014 0947   BILITOT 0.21 01/01/2014 0947      Assessment and Plan: Kelsey Mclaughlin 53 y.o. female with  1.  Recurrent stage IIA, triple positive, invasive ductal carcinoma of the left breast.  The patient is receiving Taxotere, Carboplatin, herceptin and Perjeta in the neoadjuvant setting.  She is currently cycle 3 day 8 of treatment.  She tolerated  chemotherapy moderately well.  Her labs are stable, I have reviewed these with her and her spouse in detail.    2. Lymphedema.  This has been an ongoing difficulty for the patient for 6 years.  She will continue to f/u with the lymphedema clinic three times a week and wear her glove and sleeve.  She does feel like it is improving.  3. Cardiac.  The patient underwent an echocardiogram on 11/01/2013 that demonstrated a LVEF of 56%. She was evaluated by Dr. Aundra Dubin who reviewed her echo and cleared her to continue Herceptin. He recommended she return to their clinic with a repeat echocardiogram in 3 months time. Her blood pressure is mildly elevated. We will follow this closely and may likely need to restart her Micardis HCT in the near future.   4. Neuropathy.  She is taking Gabapentin 137m TID and her neuropathy is improved with this regimen.  I recommended she continue this.    5.  Dysuria:  This has resolved after a 7 day course of Cipro.   6. Hepatic steatosis.  Her LFTs remain very mildly elevated.    The patient will return in 2 weeks for labs, evaluation, and cycle 4 of chemotherapy.   The patient and her spouse know to call [place] the  interim for any questions or concerns.  We can certainly see her sooner if needed.  I spent 25 minutes counseling the patient face to face.  The total time spent in the appointment was 30 minutes.  LMinette Headland [phone]/22/2015 11:49 PM

## 2014-01-01 NOTE — Telephone Encounter (Signed)
, °

## 2014-01-02 ENCOUNTER — Ambulatory Visit: Payer: Federal, State, Local not specified - PPO

## 2014-01-02 ENCOUNTER — Encounter: Payer: Self-pay | Admitting: Adult Health

## 2014-01-02 DIAGNOSIS — IMO0001 Reserved for inherently not codable concepts without codable children: Secondary | ICD-10-CM | POA: Diagnosis not present

## 2014-01-02 LAB — URINE CULTURE

## 2014-01-04 ENCOUNTER — Encounter: Payer: Federal, State, Local not specified - PPO | Admitting: Physical Therapy

## 2014-01-07 ENCOUNTER — Ambulatory Visit: Payer: Federal, State, Local not specified - PPO | Admitting: Physical Therapy

## 2014-01-07 DIAGNOSIS — IMO0001 Reserved for inherently not codable concepts without codable children: Secondary | ICD-10-CM | POA: Diagnosis not present

## 2014-01-10 ENCOUNTER — Ambulatory Visit: Payer: Federal, State, Local not specified - PPO

## 2014-01-10 DIAGNOSIS — IMO0001 Reserved for inherently not codable concepts without codable children: Secondary | ICD-10-CM | POA: Diagnosis not present

## 2014-01-11 ENCOUNTER — Encounter: Payer: Federal, State, Local not specified - PPO | Admitting: Physical Therapy

## 2014-01-14 ENCOUNTER — Ambulatory Visit: Payer: Federal, State, Local not specified - PPO

## 2014-01-14 ENCOUNTER — Other Ambulatory Visit: Payer: Self-pay | Admitting: *Deleted

## 2014-01-14 ENCOUNTER — Telehealth: Payer: Self-pay | Admitting: *Deleted

## 2014-01-14 ENCOUNTER — Other Ambulatory Visit: Payer: Federal, State, Local not specified - PPO

## 2014-01-14 ENCOUNTER — Ambulatory Visit: Payer: Federal, State, Local not specified - PPO | Admitting: Oncology

## 2014-01-14 ENCOUNTER — Ambulatory Visit: Payer: Federal, State, Local not specified - PPO | Admitting: Adult Health

## 2014-01-14 ENCOUNTER — Other Ambulatory Visit: Payer: Self-pay | Admitting: Oncology

## 2014-01-14 NOTE — Telephone Encounter (Signed)
Per [facility] I have scheduled appts, patient aware

## 2014-01-14 NOTE — Telephone Encounter (Signed)
Called Kelsey Mclaughlin to inquire why she was not here for appt. Kelsey Mclaughlin told me she didn't look at schedule correctly and thought appt was another day. She has been r/s for Wed. 01/16/14. I will call Kelsey Mclaughlin to inform her of this new appt. Message to be forwarded to Charlestine Massed, NP.

## 2014-01-15 ENCOUNTER — Ambulatory Visit: Payer: Federal, State, Local not specified - PPO

## 2014-01-16 ENCOUNTER — Ambulatory Visit (HOSPITAL_BASED_OUTPATIENT_CLINIC_OR_DEPARTMENT_OTHER): Payer: Federal, State, Local not specified - PPO | Admitting: Adult Health

## 2014-01-16 ENCOUNTER — Other Ambulatory Visit (HOSPITAL_BASED_OUTPATIENT_CLINIC_OR_DEPARTMENT_OTHER): Payer: Federal, State, Local not specified - PPO

## 2014-01-16 ENCOUNTER — Other Ambulatory Visit: Payer: Self-pay | Admitting: Oncology

## 2014-01-16 ENCOUNTER — Ambulatory Visit: Payer: Federal, State, Local not specified - PPO | Attending: Oncology | Admitting: Physical Therapy

## 2014-01-16 ENCOUNTER — Encounter: Payer: Self-pay | Admitting: Adult Health

## 2014-01-16 ENCOUNTER — Ambulatory Visit (HOSPITAL_BASED_OUTPATIENT_CLINIC_OR_DEPARTMENT_OTHER): Payer: Federal, State, Local not specified - PPO

## 2014-01-16 VITALS — BP 146/78 | HR 76 | Temp 98.2°F | Resp 18 | Ht 67.0 in | Wt 188.9 lb

## 2014-01-16 DIAGNOSIS — I890 Lymphedema, not elsewhere classified: Secondary | ICD-10-CM

## 2014-01-16 DIAGNOSIS — C50419 Malignant neoplasm of upper-outer quadrant of unspecified female breast: Secondary | ICD-10-CM

## 2014-01-16 DIAGNOSIS — C50412 Malignant neoplasm of upper-outer quadrant of left female breast: Secondary | ICD-10-CM

## 2014-01-16 DIAGNOSIS — K7689 Other specified diseases of liver: Secondary | ICD-10-CM

## 2014-01-16 DIAGNOSIS — Z5112 Encounter for antineoplastic immunotherapy: Secondary | ICD-10-CM

## 2014-01-16 DIAGNOSIS — G589 Mononeuropathy, unspecified: Secondary | ICD-10-CM

## 2014-01-16 DIAGNOSIS — IMO0001 Reserved for inherently not codable concepts without codable children: Secondary | ICD-10-CM | POA: Insufficient documentation

## 2014-01-16 DIAGNOSIS — Z853 Personal history of malignant neoplasm of breast: Secondary | ICD-10-CM

## 2014-01-16 DIAGNOSIS — Z170 Estrogen receptor positive status [ER+]: Secondary | ICD-10-CM

## 2014-01-16 DIAGNOSIS — Z5111 Encounter for antineoplastic chemotherapy: Secondary | ICD-10-CM

## 2014-01-16 LAB — CBC WITH DIFFERENTIAL/PLATELET
BASO%: 0.1 % (ref 0.0–2.0)
Basophils Absolute: 0 10*3/uL (ref 0.0–0.1)
EOS ABS: 0 10*3/uL (ref 0.0–0.5)
EOS%: 0 % (ref 0.0–7.0)
HEMATOCRIT: 31.8 % — AB (ref 34.8–46.6)
HGB: 10.6 g/dL — ABNORMAL LOW (ref 11.6–15.9)
LYMPH%: 6.2 % — ABNORMAL LOW (ref 14.0–49.7)
MCH: 32.3 pg (ref 25.1–34.0)
MCHC: 33.3 g/dL (ref 31.5–36.0)
MCV: 97 fL (ref 79.5–101.0)
MONO#: 0.7 10*3/uL (ref 0.1–0.9)
MONO%: 4 % (ref 0.0–14.0)
NEUT%: 89.7 % — AB (ref 38.4–76.8)
NEUTROS ABS: 14.8 10*3/uL — AB (ref 1.5–6.5)
PLATELETS: 283 10*3/uL (ref 145–400)
RBC: 3.28 10*6/uL — AB (ref 3.70–5.45)
RDW: 17.6 % — ABNORMAL HIGH (ref 11.2–14.5)
WBC: 16.5 10*3/uL — AB (ref 3.9–10.3)
lymph#: 1 10*3/uL (ref 0.9–3.3)

## 2014-01-16 LAB — COMPREHENSIVE METABOLIC PANEL (CC13)
ALBUMIN: 3.9 g/dL (ref 3.5–5.0)
ALT: 57 U/L — AB (ref 0–55)
AST: 22 U/L (ref 5–34)
Alkaline Phosphatase: 70 U/L (ref 40–150)
Anion Gap: 10 mEq/L (ref 3–11)
BUN: 12.7 mg/dL (ref 7.0–26.0)
CALCIUM: 9.8 mg/dL (ref 8.4–10.4)
CHLORIDE: 109 meq/L (ref 98–109)
CO2: 24 meq/L (ref 22–29)
Creatinine: 0.7 mg/dL (ref 0.6–1.1)
Glucose: 122 mg/dl (ref 70–140)
Potassium: 3.9 mEq/L (ref 3.5–5.1)
SODIUM: 143 meq/L (ref 136–145)
TOTAL PROTEIN: 6.6 g/dL (ref 6.4–8.3)
Total Bilirubin: 0.24 mg/dL (ref 0.20–1.20)

## 2014-01-16 MED ORDER — SODIUM CHLORIDE 0.9 % IV SOLN
420.0000 mg | Freq: Once | INTRAVENOUS | Status: AC
  Administered 2014-01-16: 420 mg via INTRAVENOUS
  Filled 2014-01-16: qty 14

## 2014-01-16 MED ORDER — DEXAMETHASONE SODIUM PHOSPHATE 20 MG/5ML IJ SOLN
INTRAMUSCULAR | Status: AC
  Filled 2014-01-16: qty 5

## 2014-01-16 MED ORDER — ACETAMINOPHEN 325 MG PO TABS
650.0000 mg | ORAL_TABLET | Freq: Once | ORAL | Status: AC
  Administered 2014-01-16: 650 mg via ORAL

## 2014-01-16 MED ORDER — DEXAMETHASONE SODIUM PHOSPHATE 20 MG/5ML IJ SOLN
20.0000 mg | Freq: Once | INTRAMUSCULAR | Status: AC
  Administered 2014-01-16: 20 mg via INTRAVENOUS

## 2014-01-16 MED ORDER — TRASTUZUMAB CHEMO INJECTION 440 MG
6.0000 mg/kg | Freq: Once | INTRAVENOUS | Status: AC
  Administered 2014-01-16: 483 mg via INTRAVENOUS
  Filled 2014-01-16: qty 23

## 2014-01-16 MED ORDER — ACETAMINOPHEN 325 MG PO TABS
ORAL_TABLET | ORAL | Status: AC
  Filled 2014-01-16: qty 2

## 2014-01-16 MED ORDER — ONDANSETRON 16 MG/50ML IVPB (CHCC)
16.0000 mg | Freq: Once | INTRAVENOUS | Status: AC
  Administered 2014-01-16: 16 mg via INTRAVENOUS

## 2014-01-16 MED ORDER — DOCETAXEL CHEMO INJECTION 160 MG/16ML
75.0000 mg/m2 | Freq: Once | INTRAVENOUS | Status: AC
  Administered 2014-01-16: 150 mg via INTRAVENOUS
  Filled 2014-01-16: qty 15

## 2014-01-16 MED ORDER — HEPARIN SOD (PORK) LOCK FLUSH 100 UNIT/ML IV SOLN
500.0000 [IU] | Freq: Once | INTRAVENOUS | Status: AC | PRN
  Administered 2014-01-16: 500 [IU]
  Filled 2014-01-16: qty 5

## 2014-01-16 MED ORDER — SODIUM CHLORIDE 0.9 % IJ SOLN
10.0000 mL | INTRAMUSCULAR | Status: DC | PRN
  Administered 2014-01-16: 10 mL
  Filled 2014-01-16: qty 10

## 2014-01-16 MED ORDER — DIPHENHYDRAMINE HCL 25 MG PO CAPS
50.0000 mg | ORAL_CAPSULE | Freq: Once | ORAL | Status: AC
  Administered 2014-01-16: 50 mg via ORAL

## 2014-01-16 MED ORDER — SODIUM CHLORIDE 0.9 % IV SOLN: Freq: Once | INTRAVENOUS | Status: DC

## 2014-01-16 MED ORDER — ONDANSETRON 16 MG/50ML IVPB (CHCC)
INTRAVENOUS | Status: AC
  Filled 2014-01-16: qty 16

## 2014-01-16 MED ORDER — DIPHENHYDRAMINE HCL 25 MG PO CAPS
ORAL_CAPSULE | ORAL | Status: AC
  Filled 2014-01-16: qty 2

## 2014-01-16 MED ORDER — SODIUM CHLORIDE 0.9 % IV SOLN
771.0000 mg | Freq: Once | INTRAVENOUS | Status: AC
  Administered 2014-01-16: 770 mg via INTRAVENOUS
  Filled 2014-01-16: qty 77

## 2014-01-16 MED ORDER — SODIUM CHLORIDE 0.9 % IV SOLN
Freq: Once | INTRAVENOUS | Status: AC
  Administered 2014-01-16: 10:00:00 via INTRAVENOUS

## 2014-01-16 NOTE — Progress Notes (Signed)
Hematology and Oncology Follow Up Visit  Kelsey Mclaughlin [id number] [birth year] 53 y.o. 01/17/2014 1:04 PM     Principle Diagnosis:Kelsey Mclaughlin 53 y.o. female with recurrent stage IIA triple positive invasive ductal carcinoma of the left breast.     Prior Therapy:  1. In 1998 at the age of 29 was diagnosed with left breast cancer in [place]. This was an invasive carcinoma grade 2 stage II. At that time she underwent a lumpectomy with excellent lymph node dissection. She received 6 months of chemotherapy. [name] She also had radiation therapy to the left breast. 2011 patient noticed a lump in the outside of her left breast. She had ultrasound workup performed and was told it was negative no biopsies were performed. In January 2014 after patient moved to the [place] she had a mammogram performed this revealed a possible mass in the outer quadrant of the left breast. Ultrasound showed it to be 1.7 cm. MRI of the breasts performed on January 30 revealed the mass to be 1.5 x 2.2 x 1.5 cm. Because of this she also had a biopsy performed on 10/04/2013. The biopsy revealed an invasive ductal carcinoma with ductal carcinoma in situ grade 2. Prognostic panel was positive for estrogen receptor 100% progesterone receptor +33% proliferation marker Ki-67 15% and the tumor was HER-2/neu positive.   2. staging scans were performed on 11/01/2013 consisting of PET scan and CT scans and demonstrated no evidence of metastases.   3. The patient was started on neoadjuvant chemotherapy consisting of Taxotere, carboplatin, Herceptin, and Perjeta beginning 11/12/13 every 21 days. A total of 6 cycles are planned.    Current therapy: Taxotere, carboplatin, herceptin perjeta cycle 4 day 1  Interim History: Kelsey Mclaughlin 53 y.o. female with stage IIA triple positive invasive ductal carcinoma of the left breast.  She is currently receiving  Taxotere, Carboplatin, Herceptin, and Perjeta given on day 1 of a 21 day cycle and Neulasta support given on day 2.  She receives this in the neoadjuvant setting.  She is here for cycle 4 day 81  She does speak [place], and is accompanied today with her spouse, Kelsey Mclaughlin who speaks [place].    She continues to do well.  She does have some redness on her face and neck that occurs after she takes the steroids.  Her spouse Kelsey Mclaughlin does report that she is increasingly irritable while taking the steroids.  Otherwise, she denies fevers, chills, nausea, vomiting, constipation, diarrhea, numbness, pain or any other concerns.     Medications:  Current Outpatient Prescriptions  Medication Sig Dispense Refill  . dexamethasone (DECADRON) 4 MG tablet TAKE 2 TABLETS TWICE DAILY START DAY BEFORE CHEMO THEN AGAIN AFTER CHEMO FOR 3 DAYS  30 tablet  1  . gabapentin (NEURONTIN) 100 MG capsule Take 1 capsule (100 mg total) by mouth 3 (three) times daily.  90 capsule  4  . lidocaine-prilocaine (EMLA) cream Apply to port-a-cath 1 - 2 hours prior to being accessed.  30 g  3  . LORazepam (ATIVAN) 0.5 MG tablet Take 1 tablet (0.5 mg total) by mouth every 6 (six) hours as needed (Nausea or vomiting).  30 tablet  3  . ondansetron (ZOFRAN) 8 MG tablet Take 1 tablet (8 mg total) by mouth 2 (two) times daily. Start the day after chemo for 3 days. Then take as needed for nausea or vomiting.  30 tablet  1  . prochlorperazine (COMPAZINE)  10 MG tablet        No current facility-administered medications for this visit.     Allergies: No Known Allergies  Medical History: Past Medical History  Diagnosis Date  . Cancer 1998, 2015    breast  . Hypertension   . Bronchitis     completed antibiotics 10/21/13/ states resolved  . Stroke 2004    TIA-  no problems since    Surgical History:  Past Surgical History  Procedure Laterality Date  . Breast surgery  1998    lumpectomy on left and removed lymphnodes in [place]  . Portacath  placement Right 10/25/2013    Procedure: INSERTION PORT-A-CATH;  Surgeon: Imogene Burn. Tsuei, MD;  Location: [facility];  Service: General;  Laterality: Right;     Review of Systems: A 10 point review of systems was conducted and is otherwise negative except for what is noted above.     Physical Exam: Blood pressure 146/78, pulse 76, temperature 98.2 F (36.8 C), temperature source Oral, resp. rate 18, height _0  (1.702 m), weight 188 lb 14.4 oz (85.684 kg). GENERAL: Patient is a well appearing female in no acute distress HEENT:  Sclerae anicteric.  Oropharynx clear and moist. No ulcerations or evidence of oropharyngeal candidiasis. Neck is supple.  NODES:  No cervical, supraclavicular, or axillary lymphadenopathy palpated.  BREAST EXAM:  Soft mass in the left upper outer quadrant. LUNGS:  Clear to auscultation bilaterally.  No wheezes or rhonchi. HEART:  Regular rate and rhythm. No murmur appreciated. ABDOMEN:  Soft, nontender.  Positive, normoactive bowel sounds. No organomegaly palpated. MSK:  No focal spinal tenderness to palpation. Full range of motion bilaterally in the upper extremities. EXTREMITIES:  No peripheral edema.  Left arm swelling SKIN:  Clear with no obvious rashes or skin changes. No nail dyscrasia. NEURO:  Nonfocal. Well oriented.  Appropriate affect. ECOG PERFORMANCE STATUS: 1 - Symptomatic but completely ambulatory   Lab Results: Lab Results  Component Value Date   WBC 16.5* 01/16/2014   HGB 10.6* 01/16/2014   HCT 31.8* 01/16/2014   MCV 97.0 01/16/2014   PLT 283 01/16/2014     Chemistry      Component Value Date/Time   NA 143 01/16/2014 0842   K 3.9 01/16/2014 0842   CO2 24 01/16/2014 0842   BUN 12.7 01/16/2014 0842   CREATININE 0.7 01/16/2014 0842      Component Value Date/Time   CALCIUM 9.8 01/16/2014 0842   ALKPHOS 70 01/16/2014 0842   AST 22 01/16/2014 0842   ALT 57* 01/16/2014 0842   BILITOT 0.24 01/16/2014 0842      Assessment and Plan: Kelsey Mclaughlin 53 y.o.  female with  1.  Recurrent stage IIA, triple positive, invasive ductal carcinoma of the left breast.  The patient is receiving Taxotere, Carboplatin, herceptin and Perjeta in the neoadjuvant setting.  She is currently cycle 4 day 1 of treatment.  Her labs are stable I reviewed these with the patient in detail.  She will proceed with treatment today.  I am concerned that I can still feel her mass in her breast.  I will review her case with Dr. Jana Hakim and the patient's spouse has requested that during our next appointment she speak to him as well.    2. Lymphedema.  This has been an ongoing difficulty for the patient for 6 years.  She will continue to f/u with the lymphedema clinic three times a week and wear her glove and sleeve.  She does  feel like it is improving.  3. Cardiac.  The patient underwent an echocardiogram on 11/01/2013 that demonstrated a LVEF of 56%. She was evaluated by Dr. Aundra Dubin who reviewed her echo and cleared her to continue Herceptin. He recommended she return to their clinic with a repeat echocardiogram in 3 months time. Her blood pressure is mildly elevated. We will follow this closely and may likely need to restart her Micardis HCT in the near future. I requested her a f/u appt with them in early June.    4. Neuropathy.  Her neuropathy is minimal and stable.  However I did increase her Gabapentin to 256m TID.    5. Hepatic steatosis.  Her ALT is 57 today.  It is stable.  The patient will return tomorrow for Neulasta, and on 5/11 for labs and evaluation.   The patient and her spouse know to call [facility] the interim for any questions or concerns.  We can certainly see her sooner if needed.  I spent 25 minutes counseling the patient face to face.  The total time spent in the appointment was 30 minutes.  LMinette Headland [phone]/03/2014 1:04 PM

## 2014-01-16 NOTE — Patient Instructions (Signed)
Cancer Center Discharge Instructions for Patients Receiving Chemotherapy  Today you received the following chemotherapy agents; Herceptin, Perjeta, Taxotere and Carboplatin.   To help prevent nausea and vomiting after your treatment, we encourage you to take your nausea medication as directed.    If you develop nausea and vomiting that is not controlled by your nausea medication, call the clinic.   BELOW ARE SYMPTOMS THAT SHOULD BE REPORTED IMMEDIATELY:  *FEVER GREATER THAN 100.5 F  *CHILLS WITH OR WITHOUT FEVER  NAUSEA AND VOMITING THAT IS NOT CONTROLLED WITH YOUR NAUSEA MEDICATION  *UNUSUAL SHORTNESS OF BREATH  *UNUSUAL BRUISING OR BLEEDING  TENDERNESS IN MOUTH AND THROAT WITH OR WITHOUT PRESENCE OF ULCERS  *URINARY PROBLEMS  *BOWEL PROBLEMS  UNUSUAL RASH Items with * indicate a potential emergency and should be followed up as soon as possible.  Feel free to call the clinic you have any questions or concerns. The clinic phone number is [phone].    

## 2014-01-16 NOTE — Patient Instructions (Signed)
Take Gabapentin 200mg  three times per day.  I am going to request follow up for you in the cardio oncology clinic.

## 2014-01-17 ENCOUNTER — Encounter: Payer: Federal, State, Local not specified - PPO | Admitting: Physical Therapy

## 2014-01-17 ENCOUNTER — Ambulatory Visit (HOSPITAL_BASED_OUTPATIENT_CLINIC_OR_DEPARTMENT_OTHER): Payer: Federal, State, Local not specified - PPO

## 2014-01-17 VITALS — BP 132/71 | HR 67 | Temp 98.2°F

## 2014-01-17 DIAGNOSIS — C50412 Malignant neoplasm of upper-outer quadrant of left female breast: Secondary | ICD-10-CM

## 2014-01-17 DIAGNOSIS — Z5189 Encounter for other specified aftercare: Secondary | ICD-10-CM

## 2014-01-17 DIAGNOSIS — C50419 Malignant neoplasm of upper-outer quadrant of unspecified female breast: Secondary | ICD-10-CM

## 2014-01-17 MED ORDER — PEGFILGRASTIM INJECTION 6 MG/0.6ML
6.0000 mg | Freq: Once | SUBCUTANEOUS | Status: AC
  Administered 2014-01-17: 6 mg via SUBCUTANEOUS
  Filled 2014-01-17: qty 0.6

## 2014-01-21 ENCOUNTER — Other Ambulatory Visit (HOSPITAL_BASED_OUTPATIENT_CLINIC_OR_DEPARTMENT_OTHER): Payer: Federal, State, Local not specified - PPO

## 2014-01-21 ENCOUNTER — Ambulatory Visit: Payer: Federal, State, Local not specified - PPO | Admitting: Oncology

## 2014-01-21 ENCOUNTER — Telehealth: Payer: Self-pay | Admitting: *Deleted

## 2014-01-21 ENCOUNTER — Other Ambulatory Visit: Payer: Federal, State, Local not specified - PPO

## 2014-01-21 ENCOUNTER — Ambulatory Visit: Payer: Federal, State, Local not specified - PPO | Admitting: Adult Health

## 2014-01-21 ENCOUNTER — Ambulatory Visit: Payer: Federal, State, Local not specified - PPO | Admitting: Physical Therapy

## 2014-01-21 DIAGNOSIS — C50419 Malignant neoplasm of upper-outer quadrant of unspecified female breast: Secondary | ICD-10-CM

## 2014-01-21 DIAGNOSIS — K7689 Other specified diseases of liver: Secondary | ICD-10-CM

## 2014-01-21 DIAGNOSIS — C50412 Malignant neoplasm of upper-outer quadrant of left female breast: Secondary | ICD-10-CM

## 2014-01-21 LAB — CBC WITH DIFFERENTIAL/PLATELET
BASO%: 1.2 % (ref 0.0–2.0)
Basophils Absolute: 0.1 10*3/uL (ref 0.0–0.1)
EOS ABS: 0 10*3/uL (ref 0.0–0.5)
EOS%: 0.2 % (ref 0.0–7.0)
HCT: 35.6 % (ref 34.8–46.6)
HGB: 11.8 g/dL (ref 11.6–15.9)
LYMPH%: 28 % (ref 14.0–49.7)
MCH: 32.1 pg (ref 25.1–34.0)
MCHC: 33.1 g/dL (ref 31.5–36.0)
MCV: 96.7 fL (ref 79.5–101.0)
MONO#: 0.4 10*3/uL (ref 0.1–0.9)
MONO%: 5.7 % (ref 0.0–14.0)
NEUT#: 4.3 10*3/uL (ref 1.5–6.5)
NEUT%: 64.9 % (ref 38.4–76.8)
Platelets: 214 10*3/uL (ref 145–400)
RBC: 3.68 10*6/uL — ABNORMAL LOW (ref 3.70–5.45)
RDW: 16.4 % — AB (ref 11.2–14.5)
WBC: 6.5 10*3/uL (ref 3.9–10.3)
lymph#: 1.8 10*3/uL (ref 0.9–3.3)
nRBC: 0 % (ref 0–0)

## 2014-01-21 LAB — COMPREHENSIVE METABOLIC PANEL (CC13)
ALK PHOS: 94 U/L (ref 40–150)
ALT: 120 U/L — AB (ref 0–55)
AST: 27 U/L (ref 5–34)
Albumin: 3.9 g/dL (ref 3.5–5.0)
Anion Gap: 10 mEq/L (ref 3–11)
BILIRUBIN TOTAL: 0.46 mg/dL (ref 0.20–1.20)
BUN: 14.8 mg/dL (ref 7.0–26.0)
CO2: 24 mEq/L (ref 22–29)
Calcium: 9.3 mg/dL (ref 8.4–10.4)
Chloride: 102 mEq/L (ref 98–109)
Creatinine: 0.7 mg/dL (ref 0.6–1.1)
Glucose: 122 mg/dl (ref 70–140)
Potassium: 3.8 mEq/L (ref 3.5–5.1)
SODIUM: 136 meq/L (ref 136–145)
TOTAL PROTEIN: 6.3 g/dL — AB (ref 6.4–8.3)

## 2014-01-21 NOTE — Telephone Encounter (Signed)
Called to inform pt of Lab results. All labs were WNL. Pt verbalized understanding. No further concerns. Message to be forwarded to Charlestine Massed, NP.

## 2014-01-24 ENCOUNTER — Encounter: Payer: Self-pay | Admitting: *Deleted

## 2014-01-24 ENCOUNTER — Ambulatory Visit: Payer: Federal, State, Local not specified - PPO

## 2014-01-24 NOTE — Progress Notes (Signed)
RECEIVED A FAX FROM [facility] THE [facility] PRESCRIPTION FOR [phone] PNEUMATIC COMPRESSION DEVICE. THIS FORM WAS SIGNED BY LINDSEY CORNETTO,NP. THE PRESCRIPTION WAS FAXED TO [phone].

## 2014-01-25 ENCOUNTER — Encounter: Payer: Self-pay | Admitting: Oncology

## 2014-01-28 ENCOUNTER — Telehealth: Payer: Self-pay | Admitting: Oncology

## 2014-01-28 NOTE — Telephone Encounter (Signed)
PATIENT CALLED TO RESCHEDULE APPT AND CHANGE PROVIDER TO DR. MAGRINAT. PT SCHEDULED FOR 05/22 @ 11:30 W/DR. MARGINAT

## 2014-02-01 ENCOUNTER — Ambulatory Visit (HOSPITAL_BASED_OUTPATIENT_CLINIC_OR_DEPARTMENT_OTHER): Payer: Federal, State, Local not specified - PPO | Admitting: Oncology

## 2014-02-01 ENCOUNTER — Other Ambulatory Visit: Payer: Self-pay | Admitting: *Deleted

## 2014-02-01 ENCOUNTER — Telehealth: Payer: Self-pay | Admitting: Oncology

## 2014-02-01 ENCOUNTER — Telehealth: Payer: Self-pay | Admitting: *Deleted

## 2014-02-01 VITALS — BP 126/78 | HR 81 | Temp 98.2°F | Resp 18 | Ht 67.0 in | Wt 192.1 lb

## 2014-02-01 DIAGNOSIS — R5383 Other fatigue: Secondary | ICD-10-CM

## 2014-02-01 DIAGNOSIS — G4700 Insomnia, unspecified: Secondary | ICD-10-CM

## 2014-02-01 DIAGNOSIS — C50419 Malignant neoplasm of upper-outer quadrant of unspecified female breast: Secondary | ICD-10-CM

## 2014-02-01 DIAGNOSIS — Z170 Estrogen receptor positive status [ER+]: Secondary | ICD-10-CM

## 2014-02-01 DIAGNOSIS — K760 Fatty (change of) liver, not elsewhere classified: Secondary | ICD-10-CM | POA: Insufficient documentation

## 2014-02-01 DIAGNOSIS — R209 Unspecified disturbances of skin sensation: Secondary | ICD-10-CM

## 2014-02-01 DIAGNOSIS — R5381 Other malaise: Secondary | ICD-10-CM

## 2014-02-01 DIAGNOSIS — C50412 Malignant neoplasm of upper-outer quadrant of left female breast: Secondary | ICD-10-CM

## 2014-02-01 DIAGNOSIS — R232 Flushing: Secondary | ICD-10-CM

## 2014-02-01 NOTE — Telephone Encounter (Signed)
I have tried to schedule appts per POF. POF not clear with orders. Waiting for desk RN to give specific instructions

## 2014-02-01 NOTE — Progress Notes (Signed)
[place]  [phone] [phone]     ID: [name] OB: 1961/02/05  MR#: [id number]  XEN#:[id number]  PCP: Default, Provider, MD GYN:   SU:  OTHER MD:  CHIEF COMPLAINT: Breast cancer, underactive therapy  BREAST CANCER HISTORY: From doctorKalsoom Khan's intake note to 07/13/5944:  "Carlotta Telfair is a 53 y.o. female. In 1998 at the age of 75 was diagnosed with left breast cancer in [place]. This was an invasive carcinoma grade 2 stage II. At that time she underwent a lumpectomy with excellent lymph node dissection. She received 6 months of chemotherapy. [name] She also had radiation therapy to the left breast. 2011 patient noticed a lump in the outside of her left breast. She had ultrasound workup performed and was told it was negative no biopsies were performed. General 2014 after patient moved to the [place] she had a mammogram performed this revealed a possible mass in the outer quadrant of the left breast. Ultrasound showed it to be 1.7 cm. MRI of the breasts performed on January 30 revealed the mass to be 1.5 x 2.2 x 1.5 cm. Because of this she also had a biopsy performed on 10/04/2013. The biopsy revealed [[id number]) an invasive ductal carcinoma with ductal carcinoma in situ grade 2. Prognostic panel was positive for estrogen receptor 100% megestrol receptor +33% proliferation marker Ki-67 15% and the tumor was HER-2/neu positive" [with a signals ratio of 3.1 and number per cell 6.3]  The patient's subsequent history is as detailed below  INTERVAL HISTORY: Tavionna returns today for followup of her breast cancer accompanied by her spouse [name]. The patient is establishing herself in my service today. She is currently day 17 cycle 4 of 6 planned cycles of neoadjuvant carboplatin/ docetaxel/ trastuzumab/ pertuzumab.    REVIEW OF SYSTEMS:  Robinn is tolerating treatment  generally well, with some of the expected side effects including fatigue, insomnia, and hot flashes, but no nausea or vomiting tolerance, no mouth sores, no unusual headaches, visual changes, dizziness, gait imbalance, or stiff neck. She is not working currently, but is very active. She does report however some burning and tingling in her fingertips she does not have similar sensations over her toes. She was started in gabapentin for this but nevertheless it until it persists but has worsened. Aside from this a detailed review of systems today was noncontributory.   PAST MEDICAL HISTORY: Past Medical History  Diagnosis Date  . Cancer 1998, 2015    breast  . Hypertension   . Bronchitis     completed antibiotics 10/21/13/ states resolved  . Stroke 2004    TIA-  no problems since    PAST SURGICAL HISTORY: Past Surgical History  Procedure Laterality Date  . Breast surgery  1998    lumpectomy on left and removed lymphnodes in [place]  . Portacath placement Right 10/25/2013    Procedure: INSERTION PORT-A-CATH;  Surgeon: Imogene Burn. Georgette Dover, MD;  Location: [facility];  Service: General;  Laterality: Right;    FAMILY HISTORY Family History  Problem Relation Age of Onset  . Breast cancer Mother 69  . Heart disease Father   . Heart attack Father   . Breast cancer Maternal Grandmother 68  . Brain cancer Maternal Uncle 74    benign brain tumor  . Heart attack Paternal Aunt     GYNECOLOGIC HISTORY:   menarche age 50, first live birth age 32. The  patient is GX P2. She went to the change of life approximately 2011.   SOCIAL HISTORY:  The patient worked previously as a Marine scientist. She has 2 children both of whom live in [place], works for the [employer], and Marcello Moores, is a Librarian, academic. They are in their early 50s. There are no grandchildren. The patient married [name] (both women are originally from [place]) in [place]. [place] 5 talks and 10  chickens. They're not church attender's    ADVANCED DIRECTIVES:    HEALTH MAINTENANCE: History  Substance Use Topics  . Smoking status: Never Smoker   . Smokeless tobacco: Never Used  . Alcohol Use: Yes     Comment: 2 drinks/day     Colonoscopy:  PAP:  Bone density:  Lipid panel:  No Known Allergies  Current Outpatient Prescriptions  Medication Sig Dispense Refill  . dexamethasone (DECADRON) 4 MG tablet TAKE 2 TABLETS TWICE DAILY START DAY BEFORE CHEMO THEN AGAIN AFTER CHEMO FOR 3 DAYS  30 tablet  1  . gabapentin (NEURONTIN) 100 MG capsule Take 1 capsule (100 mg total) by mouth 3 (three) times daily.  90 capsule  4  . lidocaine-prilocaine (EMLA) cream Apply to port-a-cath 1 - 2 hours prior to being accessed.  30 g  3  . LORazepam (ATIVAN) 0.5 MG tablet Take 1 tablet (0.5 mg total) by mouth every 6 (six) hours as needed (Nausea or vomiting).  30 tablet  3  . ondansetron (ZOFRAN) 8 MG tablet Take 1 tablet (8 mg total) by mouth 2 (two) times daily. Start the day after chemo for 3 days. Then take as needed for nausea or vomiting.  30 tablet  1  . prochlorperazine (COMPAZINE) 10 MG tablet        No current facility-administered medications for this visit.    OBJECTIVE: middle-aged white woman in no acute distress  Filed Vitals:   02/01/14 1220  BP: 126/78  Pulse: 81  Temp: 98.2 F (36.8 C)  Resp: 18     Body mass index is 30.08 kg/(m^2).    ECOG FS:1 - Symptomatic but completely ambulatory  Ocular: Sclerae unicteric, pupils equal, round and reactive to light Ear-nose-throat: Oropharynx clear, no thrush or other lesions  Lymphatic: No cervical or supraclavicular adenopathy Lungs no rales or rhonchi Heart regular rate and rhythm Abd soft, nontender, positive bowel sounds MSK no focal spinal tenderness, no joint edema Neuro: non-focal, well-oriented, appropriate affect Breasts: The right breast is unremarkable. There is an area of irregularity in the upper left breast,  measuring perhaps 1/2 cm. It is very hard to delineate. It is soft. There are no skin or nipple changes of concern. The left axilla is benign   LAB RESULTS:  CMP     Component Value Date/Time   NA 136 01/21/2014 0943   K 3.8 01/21/2014 0943   CO2 24 01/21/2014 0943   GLUCOSE 122 01/21/2014 0943   BUN 14.8 01/21/2014 0943   CREATININE 0.7 01/21/2014 0943   CALCIUM 9.3 01/21/2014 0943   PROT 6.3* 01/21/2014 0943   ALBUMIN 3.9 01/21/2014 0943   AST 27 01/21/2014 0943   ALT 120* 01/21/2014 0943   ALKPHOS 94 01/21/2014 0943   BILITOT 0.46 01/21/2014 0943    I No results found for this basename: SPEP, UPEP,  kappa and lambda light chains    Lab Results  Component Value Date   WBC 6.5 01/21/2014   NEUTROABS 4.3 01/21/2014  HGB 11.8 01/21/2014   HCT 35.6 01/21/2014   MCV 96.7 01/21/2014   PLT 214 01/21/2014      Chemistry      Component Value Date/Time   NA 136 01/21/2014 0943   K 3.8 01/21/2014 0943   CO2 24 01/21/2014 0943   BUN 14.8 01/21/2014 0943   CREATININE 0.7 01/21/2014 0943      Component Value Date/Time   CALCIUM 9.3 01/21/2014 0943   ALKPHOS 94 01/21/2014 0943   AST 27 01/21/2014 0943   ALT 120* 01/21/2014 0943   BILITOT 0.46 01/21/2014 0943       No results found for this basename: LABCA2    No components found with this basename: LABCA125    No results found for this basename: INR,  in the last 168 hours  Urinalysis    Component Value Date/Time   LABSPEC 1.005 01/01/2014 0947   GLUCOSEU Negative 01/01/2014 0947   UROBILINOGEN 0.2 01/01/2014 0947    STUDIES: No results found. The patient is scheduled for repeat breast MRI 02/19/2049   ASSESSMENT: 53 y.o. BRCA negative [place] woman, [place] speaker   (1) status post left lumpectomy and axillary lymph node dissection in 1998 in [place] for a stage II invasive ductal carcinoma, grade 2, treated with adjuvant chemotherapy (specific drugs not clear) and adjuvant radiation.  (2) status post left breast upper  outer quadrant biopsy 10/04/2013 for a clinically mT2 N0, stage IIA invasive ductal carcinoma, grade 2, estrogen receptor 100% positive, progesterone receptor 33% positive, with an MIB-1 of 15%, and HER-2 amplified; staging studies showed no evidence of metastatic disease  (3) started on neoadjuvant carboplatin/ docetaxel/ trastuzumab/ pertuzumab 11/12/2013  (a) completed 4 cycles as of 05/06/201, after which docetaxel was discontinued because of neuropathy symptoms   (b) gemcitabine to be given day 1 and day 8 with carboplatin in the final 2 cycles  (4) surgery to follow chemotherapy  (5) aromatase inhibitors to follow surgery  (6) genetic testing February 2015 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    PLAN: We spent approximately 45 minutes going over Malary's situation today. I am concerned that she is developing neuropathy and that this can be a permanent and possibly disabling problem for her in the future. Accordingly we are going to discontinue the docetaxel. This says is secondary advantage that she was tolerating the steroids very poorly, with flushing and weight gain as side effects. We're going to substitute gemcitabine instead of docetaxel, and she will not need to take the day 0 steroid preparation as a result.  She understands gemcitabine will be given on days 1 and 8 of each cycle. We discussed the possible toxicities, side effects and complications of this combination.  Marsheila is a good understanding of the overall plan. She agrees with it. She knows the goal of treatment in her case is cure. She will call with any problems that may develop before her next visit here.  [name], MD   02/01/2014 9:48 PM

## 2014-02-01 NOTE — Telephone Encounter (Signed)
, °

## 2014-02-05 ENCOUNTER — Telehealth: Payer: Self-pay | Admitting: *Deleted

## 2014-02-05 ENCOUNTER — Other Ambulatory Visit (HOSPITAL_BASED_OUTPATIENT_CLINIC_OR_DEPARTMENT_OTHER): Payer: Federal, State, Local not specified - PPO

## 2014-02-05 ENCOUNTER — Other Ambulatory Visit: Payer: Self-pay | Admitting: Oncology

## 2014-02-05 ENCOUNTER — Ambulatory Visit (HOSPITAL_BASED_OUTPATIENT_CLINIC_OR_DEPARTMENT_OTHER): Payer: Federal, State, Local not specified - PPO

## 2014-02-05 DIAGNOSIS — C50412 Malignant neoplasm of upper-outer quadrant of left female breast: Secondary | ICD-10-CM

## 2014-02-05 DIAGNOSIS — Z5112 Encounter for antineoplastic immunotherapy: Secondary | ICD-10-CM

## 2014-02-05 DIAGNOSIS — C50419 Malignant neoplasm of upper-outer quadrant of unspecified female breast: Secondary | ICD-10-CM

## 2014-02-05 DIAGNOSIS — Z5111 Encounter for antineoplastic chemotherapy: Secondary | ICD-10-CM

## 2014-02-05 LAB — COMPREHENSIVE METABOLIC PANEL (CC13)
ALT: 77 U/L — AB (ref 0–55)
AST: 40 U/L — AB (ref 5–34)
Albumin: 3.3 g/dL — ABNORMAL LOW (ref 3.5–5.0)
Alkaline Phosphatase: 70 U/L (ref 40–150)
Anion Gap: 8 mEq/L (ref 3–11)
BUN: 9.2 mg/dL (ref 7.0–26.0)
CO2: 25 mEq/L (ref 22–29)
CREATININE: 0.7 mg/dL (ref 0.6–1.1)
Calcium: 9 mg/dL (ref 8.4–10.4)
Chloride: 112 mEq/L — ABNORMAL HIGH (ref 98–109)
Glucose: 130 mg/dl (ref 70–140)
Potassium: 3.9 mEq/L (ref 3.5–5.1)
Sodium: 145 mEq/L (ref 136–145)
Total Protein: 5.9 g/dL — ABNORMAL LOW (ref 6.4–8.3)

## 2014-02-05 LAB — CBC WITH DIFFERENTIAL/PLATELET
BASO%: 0.2 % (ref 0.0–2.0)
Basophils Absolute: 0 10*3/uL (ref 0.0–0.1)
EOS%: 0.3 % (ref 0.0–7.0)
Eosinophils Absolute: 0 10*3/uL (ref 0.0–0.5)
HCT: 32.6 % — ABNORMAL LOW (ref 34.8–46.6)
HGB: 10.9 g/dL — ABNORMAL LOW (ref 11.6–15.9)
LYMPH#: 1 10*3/uL (ref 0.9–3.3)
LYMPH%: 21 % (ref 14.0–49.7)
MCH: 32.7 pg (ref 25.1–34.0)
MCHC: 33.3 g/dL (ref 31.5–36.0)
MCV: 98.2 fL (ref 79.5–101.0)
MONO#: 0.7 10*3/uL (ref 0.1–0.9)
MONO%: 13.7 % (ref 0.0–14.0)
NEUT#: 3.2 10*3/uL (ref 1.5–6.5)
NEUT%: 64.8 % (ref 38.4–76.8)
PLATELETS: 256 10*3/uL (ref 145–400)
RBC: 3.32 10*6/uL — ABNORMAL LOW (ref 3.70–5.45)
RDW: 16.7 % — ABNORMAL HIGH (ref 11.2–14.5)
WBC: 5 10*3/uL (ref 3.9–10.3)

## 2014-02-05 MED ORDER — SODIUM CHLORIDE 0.9 % IV SOLN
420.0000 mg | Freq: Once | INTRAVENOUS | Status: AC
  Administered 2014-02-05: 420 mg via INTRAVENOUS
  Filled 2014-02-05: qty 14

## 2014-02-05 MED ORDER — DIPHENHYDRAMINE HCL 25 MG PO CAPS
ORAL_CAPSULE | ORAL | Status: AC
  Filled 2014-02-05: qty 2

## 2014-02-05 MED ORDER — ACETAMINOPHEN 325 MG PO TABS
650.0000 mg | ORAL_TABLET | Freq: Once | ORAL | Status: AC
  Administered 2014-02-05: 650 mg via ORAL

## 2014-02-05 MED ORDER — ACETAMINOPHEN 325 MG PO TABS
ORAL_TABLET | ORAL | Status: AC
  Filled 2014-02-05: qty 2

## 2014-02-05 MED ORDER — SODIUM CHLORIDE 0.9 % IV SOLN: Freq: Once | INTRAVENOUS | Status: AC

## 2014-02-05 MED ORDER — SODIUM CHLORIDE 0.9 % IV SOLN
Freq: Once | INTRAVENOUS | Status: AC
  Administered 2014-02-05: 12:00:00 via INTRAVENOUS

## 2014-02-05 MED ORDER — SODIUM CHLORIDE 0.9 % IV SOLN
771.0000 mg | Freq: Once | INTRAVENOUS | Status: AC
  Administered 2014-02-05: 770 mg via INTRAVENOUS
  Filled 2014-02-05: qty 77

## 2014-02-05 MED ORDER — DEXAMETHASONE SODIUM PHOSPHATE 10 MG/ML IJ SOLN
INTRAMUSCULAR | Status: AC
  Filled 2014-02-05: qty 1

## 2014-02-05 MED ORDER — ONDANSETRON 8 MG/50ML IVPB (CHCC)
8.0000 mg | Freq: Once | INTRAVENOUS | Status: AC
  Administered 2014-02-05: 8 mg via INTRAVENOUS

## 2014-02-05 MED ORDER — SODIUM CHLORIDE 0.9 % IV SOLN
800.0000 mg/m2 | Freq: Once | INTRAVENOUS | Status: AC
  Administered 2014-02-05: 1558 mg via INTRAVENOUS
  Filled 2014-02-05: qty 40.98

## 2014-02-05 MED ORDER — TRASTUZUMAB CHEMO INJECTION 440 MG
6.0000 mg/kg | Freq: Once | INTRAVENOUS | Status: AC
  Administered 2014-02-05: 483 mg via INTRAVENOUS
  Filled 2014-02-05: qty 23

## 2014-02-05 MED ORDER — SODIUM CHLORIDE 0.9 % IJ SOLN
10.0000 mL | INTRAMUSCULAR | Status: DC | PRN
Start: 2014-02-05 — End: 2014-02-05
  Administered 2014-02-05: 10 mL
  Filled 2014-02-05: qty 10

## 2014-02-05 MED ORDER — DEXAMETHASONE SODIUM PHOSPHATE 10 MG/ML IJ SOLN
10.0000 mg | Freq: Once | INTRAMUSCULAR | Status: AC
  Administered 2014-02-05: 10 mg via INTRAVENOUS

## 2014-02-05 MED ORDER — ONDANSETRON 8 MG/NS 50 ML IVPB
INTRAVENOUS | Status: AC
  Filled 2014-02-05: qty 8

## 2014-02-05 MED ORDER — DIPHENHYDRAMINE HCL 25 MG PO CAPS
50.0000 mg | ORAL_CAPSULE | Freq: Once | ORAL | Status: AC
  Administered 2014-02-05: 50 mg via ORAL

## 2014-02-05 MED ORDER — HEPARIN SOD (PORK) LOCK FLUSH 100 UNIT/ML IV SOLN
500.0000 [IU] | Freq: Once | INTRAVENOUS | Status: AC | PRN
  Administered 2014-02-05: 500 [IU]
  Filled 2014-02-05: qty 5

## 2014-02-05 NOTE — Telephone Encounter (Signed)
Per staff message and POF I have scheduled appts.  JMW  

## 2014-02-06 ENCOUNTER — Ambulatory Visit: Payer: Federal, State, Local not specified - PPO

## 2014-02-12 ENCOUNTER — Other Ambulatory Visit (HOSPITAL_BASED_OUTPATIENT_CLINIC_OR_DEPARTMENT_OTHER): Payer: Federal, State, Local not specified - PPO

## 2014-02-12 ENCOUNTER — Ambulatory Visit (HOSPITAL_BASED_OUTPATIENT_CLINIC_OR_DEPARTMENT_OTHER): Payer: Federal, State, Local not specified - PPO

## 2014-02-12 ENCOUNTER — Encounter: Payer: Self-pay | Admitting: Oncology

## 2014-02-12 ENCOUNTER — Encounter: Payer: Self-pay | Admitting: Physician Assistant

## 2014-02-12 ENCOUNTER — Ambulatory Visit (HOSPITAL_BASED_OUTPATIENT_CLINIC_OR_DEPARTMENT_OTHER): Payer: Federal, State, Local not specified - PPO | Admitting: Physician Assistant

## 2014-02-12 VITALS — BP 135/82 | HR 89 | Temp 98.1°F | Resp 18 | Ht 67.0 in | Wt 187.7 lb

## 2014-02-12 DIAGNOSIS — C50412 Malignant neoplasm of upper-outer quadrant of left female breast: Secondary | ICD-10-CM

## 2014-02-12 DIAGNOSIS — C50419 Malignant neoplasm of upper-outer quadrant of unspecified female breast: Secondary | ICD-10-CM

## 2014-02-12 DIAGNOSIS — Z170 Estrogen receptor positive status [ER+]: Secondary | ICD-10-CM

## 2014-02-12 DIAGNOSIS — Z853 Personal history of malignant neoplasm of breast: Secondary | ICD-10-CM

## 2014-02-12 DIAGNOSIS — Z5111 Encounter for antineoplastic chemotherapy: Secondary | ICD-10-CM

## 2014-02-12 LAB — CBC WITH DIFFERENTIAL/PLATELET
BASO%: 0.6 % (ref 0.0–2.0)
BASOS ABS: 0 10*3/uL (ref 0.0–0.1)
EOS%: 0.9 % (ref 0.0–7.0)
Eosinophils Absolute: 0 10*3/uL (ref 0.0–0.5)
HCT: 30.9 % — ABNORMAL LOW (ref 34.8–46.6)
HEMOGLOBIN: 10.3 g/dL — AB (ref 11.6–15.9)
LYMPH%: 18.5 % (ref 14.0–49.7)
MCH: 33.2 pg (ref 25.1–34.0)
MCHC: 33.4 g/dL (ref 31.5–36.0)
MCV: 99.4 fL (ref 79.5–101.0)
MONO#: 0.1 10*3/uL (ref 0.1–0.9)
MONO%: 3.3 % (ref 0.0–14.0)
NEUT#: 2.6 10*3/uL (ref 1.5–6.5)
NEUT%: 76.7 % (ref 38.4–76.8)
Platelets: 246 10*3/uL (ref 145–400)
RBC: 3.11 10*6/uL — ABNORMAL LOW (ref 3.70–5.45)
RDW: 15.2 % — ABNORMAL HIGH (ref 11.2–14.5)
WBC: 3.3 10*3/uL — AB (ref 3.9–10.3)
lymph#: 0.6 10*3/uL — ABNORMAL LOW (ref 0.9–3.3)

## 2014-02-12 LAB — COMPREHENSIVE METABOLIC PANEL (CC13)
ALT: 229 U/L — ABNORMAL HIGH (ref 0–55)
ANION GAP: 13 meq/L — AB (ref 3–11)
AST: 83 U/L — ABNORMAL HIGH (ref 5–34)
Albumin: 3.5 g/dL (ref 3.5–5.0)
Alkaline Phosphatase: 84 U/L (ref 40–150)
BUN: 8.7 mg/dL (ref 7.0–26.0)
CALCIUM: 9.1 mg/dL (ref 8.4–10.4)
CHLORIDE: 107 meq/L (ref 98–109)
CO2: 22 meq/L (ref 22–29)
CREATININE: 0.7 mg/dL (ref 0.6–1.1)
Glucose: 106 mg/dl (ref 70–140)
Potassium: 3.4 mEq/L — ABNORMAL LOW (ref 3.5–5.1)
Sodium: 142 mEq/L (ref 136–145)
Total Bilirubin: 0.32 mg/dL (ref 0.20–1.20)
Total Protein: 6.2 g/dL — ABNORMAL LOW (ref 6.4–8.3)

## 2014-02-12 MED ORDER — HEPARIN SOD (PORK) LOCK FLUSH 100 UNIT/ML IV SOLN
500.0000 [IU] | Freq: Once | INTRAVENOUS | Status: AC | PRN
  Administered 2014-02-12: 500 [IU]
  Filled 2014-02-12: qty 5

## 2014-02-12 MED ORDER — ONDANSETRON 8 MG/50ML IVPB (CHCC)
8.0000 mg | Freq: Once | INTRAVENOUS | Status: AC
  Administered 2014-02-12: 8 mg via INTRAVENOUS

## 2014-02-12 MED ORDER — SODIUM CHLORIDE 0.9 % IV SOLN
800.0000 mg/m2 | Freq: Once | INTRAVENOUS | Status: AC
  Administered 2014-02-12: 1558 mg via INTRAVENOUS
  Filled 2014-02-12: qty 40.98

## 2014-02-12 MED ORDER — SODIUM CHLORIDE 0.9 % IV SOLN
Freq: Once | INTRAVENOUS | Status: AC
  Administered 2014-02-12: 13:00:00 via INTRAVENOUS

## 2014-02-12 MED ORDER — SODIUM CHLORIDE 0.9 % IJ SOLN
10.0000 mL | INTRAMUSCULAR | Status: DC | PRN
  Administered 2014-02-12: 10 mL
  Filled 2014-02-12: qty 10

## 2014-02-12 MED ORDER — DEXAMETHASONE SODIUM PHOSPHATE 10 MG/ML IJ SOLN
10.0000 mg | Freq: Once | INTRAMUSCULAR | Status: AC
  Administered 2014-02-12: 10 mg via INTRAVENOUS

## 2014-02-12 MED ORDER — ONDANSETRON 8 MG/NS 50 ML IVPB
INTRAVENOUS | Status: AC
  Filled 2014-02-12: qty 8

## 2014-02-12 MED ORDER — DEXAMETHASONE SODIUM PHOSPHATE 10 MG/ML IJ SOLN
INTRAMUSCULAR | Status: AC
  Filled 2014-02-12: qty 1

## 2014-02-12 NOTE — Progress Notes (Signed)
Insurance paying 100% for Neulasta °

## 2014-02-12 NOTE — Progress Notes (Signed)
[facility]  [phone]) [phone] [phone]     ID: Kelsey Mclaughlin OB: 1961/08/20  MR#: [id number]  VHQ#:[id number]  PCP: Default, Provider, MD GYN:   SU:  OTHER MD:  CHIEF COMPLAINT: Breast cancer, underactive therapy  BREAST CANCER HISTORY: From doctorKalsoom Khan's intake note to 41/32/4401:  "Kelsey Mclaughlin is a 53 y.o. female. In 1998 at the age of 70 was diagnosed with left breast cancer in [place]. This was an invasive carcinoma grade 2 stage II. At that time she underwent a lumpectomy with excellent lymph node dissection. She received 6 months of chemotherapy. [name] She also had radiation therapy to the left breast. 2011 patient noticed a lump in the outside of her left breast. She had ultrasound workup performed and was told it was negative no biopsies were performed. General 2014 after patient moved to the [place] she had a mammogram performed this revealed a possible mass in the outer quadrant of the left breast. Ultrasound showed it to be 1.7 cm. MRI of the breasts performed on January 30 revealed the mass to be 1.5 x 2.2 x 1.5 cm. Because of this she also had a biopsy performed on 10/04/2013. The biopsy revealed [[id number]) an invasive ductal carcinoma with ductal carcinoma in situ grade 2. Prognostic panel was positive for estrogen receptor 100% megestrol receptor +33% proliferation marker Ki-67 15% and the tumor was HER-2/neu positive" [with a signals ratio of 3.1 and number per cell 6.3]  The patient's subsequent history is as detailed below  INTERVAL HISTORY: Kelsey Mclaughlin returns today for followup of her breast cancer accompanied by her spouse Kelsey Mclaughlin. She is followed by Dr. Jana Hakim. She present today to proceed with day 8 of cycle #5. The patient is establishing herself in my service today. She is currently being treated with  neoadjuvant carboplatin/ docetaxel/ trastuzumab/  pertuzumab for a total of 6 planned cycles. Docetaxel discontinued: 02/01/2014 secondary to neuropathy. The dexamethasone was also discontinued. She is receiving gemcitabine in place of the docetaxel.    REVIEW OF SYSTEMS:  Zya continues to tolerate her treatment relatively well. She reports some mild diarrhea but states it is self-limiting as well as some mild nausea not requiring her antiemetics. Her appetite remains good and she is sleeping well. She does report some occasional discomfort with swallowing. She voiced no other specific complaints. She and her spouse requests a referral to a plastic surgeon regarding reconstructive surgery. They would prefer female surgeon. A 10 system review is negative except as mentioned above.  PAST MEDICAL HISTORY: Past Medical History  Diagnosis Date  . Cancer 1998, 2015    breast  . Hypertension   . Bronchitis     completed antibiotics 10/21/13/ states resolved  . Stroke 2004    TIA-  no problems since    PAST SURGICAL HISTORY: Past Surgical History  Procedure Laterality Date  . Breast surgery  1998    lumpectomy on left and removed lymphnodes in [place]  . Portacath placement Right 10/25/2013    Procedure: INSERTION PORT-A-CATH;  Surgeon: Imogene Burn. Georgette Dover, MD;  Location: [facility];  Service: General;  Laterality: Right;    FAMILY HISTORY Family History  Problem Relation Age of Onset  . Breast cancer Mother 58  . Heart disease Father   . Heart attack Father   . Breast cancer Maternal Grandmother 78  . Brain cancer Maternal Uncle 74    benign brain tumor  .  Heart attack Paternal Aunt     GYNECOLOGIC HISTORY:   menarche age 59, first live birth age 64. The patient is GX P2. She went to the change of life approximately 2011.   SOCIAL HISTORY:  The patient worked previously as a Marine scientist. She has 2 children both of whom live in [place], works for the [employer], and Marcello Moores, is a Librarian, academic. They are in their early 47s.  There are no grandchildren. The patient married Kelsey Mclaughlin (both women are originally from [place]) in [place]. The currently live in the [place] area with 5 talks and 10 chickens. They're not church attender's    ADVANCED DIRECTIVES:    HEALTH MAINTENANCE: History  Substance Use Topics  . Smoking status: Never Smoker   . Smokeless tobacco: Never Used  . Alcohol Use: Yes     Comment: 2 drinks/day     Colonoscopy:  PAP:  Bone density:  Lipid panel:  No Known Allergies  Current Outpatient Prescriptions  Medication Sig Dispense Refill  . lidocaine-prilocaine (EMLA) cream Apply to port-a-cath 1 - 2 hours prior to being accessed.  30 g  3  . dexamethasone (DECADRON) 4 MG tablet TAKE 2 TABLETS TWICE DAILY START DAY BEFORE CHEMO THEN AGAIN AFTER CHEMO FOR 3 DAYS  30 tablet  1  . gabapentin (NEURONTIN) 100 MG capsule Take 1 capsule (100 mg total) by mouth 3 (three) times daily.  90 capsule  4  . LORazepam (ATIVAN) 0.5 MG tablet Take 1 tablet (0.5 mg total) by mouth every 6 (six) hours as needed (Nausea or vomiting).  30 tablet  3  . ondansetron (ZOFRAN) 8 MG tablet Take 1 tablet (8 mg total) by mouth 2 (two) times daily. Start the day after chemo for 3 days. Then take as needed for nausea or vomiting.  30 tablet  1  . prochlorperazine (COMPAZINE) 10 MG tablet        No current facility-administered medications for this visit.    OBJECTIVE: middle-aged white woman in no acute distress  Filed Vitals:   02/12/14 1131  BP: 135/82  Pulse: 89  Temp: 98.1 F (36.7 C)  Resp: 18     Body mass index is 29.39 kg/(m^2).    ECOG FS:1 - Symptomatic but completely ambulatory  Ocular: Sclerae unicteric, pupils equal, round and reactive to light Ear-nose-throat: Oropharynx clear, no thrush or other lesions  Lymphatic: No cervical or supraclavicular adenopathy Lungs no rales or rhonchi Heart regular rate and rhythm Abd soft, nontender, positive bowel sounds MSK no focal spinal tenderness, no  joint edema Neuro: non-focal, well-oriented, appropriate affect Breasts:  Exam deferred  LAB RESULTS:  CMP     Component Value Date/Time   NA 142 02/12/2014 1117   K 3.4* 02/12/2014 1117   CO2 22 02/12/2014 1117   GLUCOSE 106 02/12/2014 1117   BUN 8.7 02/12/2014 1117   CREATININE 0.7 02/12/2014 1117   CALCIUM 9.1 02/12/2014 1117   PROT 6.2* 02/12/2014 1117   ALBUMIN 3.5 02/12/2014 1117   AST 83* 02/12/2014 1117   ALT 229* 02/12/2014 1117   ALKPHOS 84 02/12/2014 1117   BILITOT 0.32 02/12/2014 1117    I No results found for this basename: SPEP,  UPEP,   kappa and lambda light chains    Lab Results  Component Value Date   WBC 3.3* 02/12/2014   NEUTROABS 2.6 02/12/2014   HGB 10.3* 02/12/2014   HCT 30.9* 02/12/2014   MCV 99.4 02/12/2014   PLT 246  02/12/2014      Chemistry      Component Value Date/Time   NA 142 02/12/2014 1117   K 3.4* 02/12/2014 1117   CO2 22 02/12/2014 1117   BUN 8.7 02/12/2014 1117   CREATININE 0.7 02/12/2014 1117      Component Value Date/Time   CALCIUM 9.1 02/12/2014 1117   ALKPHOS 84 02/12/2014 1117   AST 83* 02/12/2014 1117   ALT 229* 02/12/2014 1117   BILITOT 0.32 02/12/2014 1117       No results found for this basename: LABCA2    No components found with this basename: LABCA125    No results found for this basename: INR,  in the last 168 hours  Urinalysis    Component Value Date/Time   LABSPEC 1.005 01/01/2014 0947   GLUCOSEU Negative 01/01/2014 0947   UROBILINOGEN 0.2 01/01/2014 0947    STUDIES: No results found. The patient is scheduled for repeat breast MRI 02/19/2049   ASSESSMENT: 53 y.o. BRCA negative [place] woman, [place] speaker   (1) status post left lumpectomy and axillary lymph node dissection in 1998 in [place] for a stage II invasive ductal carcinoma, grade 2, treated with adjuvant chemotherapy (specific drugs not clear) and adjuvant radiation.  (2) status post left breast upper outer quadrant biopsy 10/04/2013 for a clinically mT2 N0, stage IIA invasive  ductal carcinoma, grade 2, estrogen receptor 100% positive, progesterone receptor 33% positive, with an MIB-1 of 15%, and HER-2 amplified; staging studies showed no evidence of metastatic disease  (3) started on neoadjuvant carboplatin/ docetaxel/ trastuzumab/ pertuzumab 11/12/2013  (a) completed 4 cycles as of 05/06/201, after which docetaxel was discontinued because of neuropathy symptoms   (b) gemcitabine to be given day 1 and day 8 with carboplatin in the final 2 cycles  (4) surgery to follow chemotherapy  (5) aromatase inhibitors to follow surgery  (6) genetic testing February 2015 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    PLAN: Patient was reviewed with Dr. Jana Hakim. I we will refer the patient to Dr. Irene Limbo regarding reconstruction surgery. She will continue with her labs and chemotherapy as scheduled. She'll followup as previously scheduled with Dr. Dr. Jana Hakim on 02/19/2014.    She will call with any problems that may develop before her next visit here.  Carlton Adam, PA-C   02/12/2014 4:59 PM

## 2014-02-12 NOTE — Patient Instructions (Signed)
[facility] Discharge Instructions for Patients Receiving Chemotherapy  Today you received the following chemotherapy agents: Gemzar  To help prevent nausea and vomiting after your treatment, we encourage you to take your nausea medication: Compazine 10 mg as prescribed by your physician.    If you develop nausea and vomiting that is not controlled by your nausea medication, call the clinic.   BELOW ARE SYMPTOMS THAT SHOULD BE REPORTED IMMEDIATELY:  *FEVER GREATER THAN 100.5 F  *CHILLS WITH OR WITHOUT FEVER  NAUSEA AND VOMITING THAT IS NOT CONTROLLED WITH YOUR NAUSEA MEDICATION  *UNUSUAL SHORTNESS OF BREATH  *UNUSUAL BRUISING OR BLEEDING  TENDERNESS IN MOUTH AND THROAT WITH OR WITHOUT PRESENCE OF ULCERS  *URINARY PROBLEMS  *BOWEL PROBLEMS  UNUSUAL RASH Items with * indicate a potential emergency and should be followed up as soon as possible.  Feel free to call the clinic you have any questions or concerns. The clinic phone number is [phone].

## 2014-02-12 NOTE — Progress Notes (Signed)
Insurance is pahin

## 2014-02-13 ENCOUNTER — Ambulatory Visit (HOSPITAL_BASED_OUTPATIENT_CLINIC_OR_DEPARTMENT_OTHER): Payer: Federal, State, Local not specified - PPO

## 2014-02-13 VITALS — BP 131/66 | HR 80 | Temp 97.7°F

## 2014-02-13 DIAGNOSIS — C50419 Malignant neoplasm of upper-outer quadrant of unspecified female breast: Secondary | ICD-10-CM

## 2014-02-13 DIAGNOSIS — C50412 Malignant neoplasm of upper-outer quadrant of left female breast: Secondary | ICD-10-CM

## 2014-02-13 DIAGNOSIS — Z5189 Encounter for other specified aftercare: Secondary | ICD-10-CM

## 2014-02-13 MED ORDER — PEGFILGRASTIM INJECTION 6 MG/0.6ML
6.0000 mg | Freq: Once | SUBCUTANEOUS | Status: AC
  Administered 2014-02-13: 6 mg via SUBCUTANEOUS
  Filled 2014-02-13: qty 0.6

## 2014-02-14 NOTE — Patient Instructions (Signed)
Continue labs and chemotherapy as scheduled You've been referred to Dr. Irene Limbo regarding breast reconstruction surgery Followup with Dr. Jana Hakim as scheduled on 02/19/2014

## 2014-02-17 ENCOUNTER — Telehealth: Payer: Self-pay | Admitting: Oncology

## 2014-02-17 NOTE — Telephone Encounter (Signed)
appts already on schedule per 6/2 pof - s/w relative confirming next appts on 6/9. pt to get new schedule when she comes in.

## 2014-02-18 ENCOUNTER — Other Ambulatory Visit: Payer: Self-pay | Admitting: Physician Assistant

## 2014-02-18 ENCOUNTER — Telehealth: Payer: Self-pay | Admitting: Oncology

## 2014-02-18 ENCOUNTER — Ambulatory Visit (HOSPITAL_COMMUNITY): Payer: Federal, State, Local not specified - PPO

## 2014-02-18 NOTE — Telephone Encounter (Signed)
Pt appt. To see Dr.Thimmappa is 02/21/14@11 :20. Medical records faxed. Pt is aware

## 2014-02-19 ENCOUNTER — Ambulatory Visit (HOSPITAL_BASED_OUTPATIENT_CLINIC_OR_DEPARTMENT_OTHER): Payer: Federal, State, Local not specified - PPO | Admitting: Oncology

## 2014-02-19 ENCOUNTER — Ambulatory Visit (HOSPITAL_COMMUNITY)
Admission: RE | Admit: 2014-02-19 | Discharge: 2014-02-19 | Disposition: A | Discharge status: Home / Self Care | Payer: Federal, State, Local not specified - PPO | Source: Ambulatory Visit | Attending: Adult Health | Admitting: Adult Health

## 2014-02-19 ENCOUNTER — Telehealth: Payer: Self-pay | Admitting: *Deleted

## 2014-02-19 ENCOUNTER — Other Ambulatory Visit (HOSPITAL_BASED_OUTPATIENT_CLINIC_OR_DEPARTMENT_OTHER): Payer: Federal, State, Local not specified - PPO

## 2014-02-19 VITALS — BP 109/70 | HR 88 | Temp 98.3°F | Resp 18 | Ht 67.0 in | Wt 188.8 lb

## 2014-02-19 DIAGNOSIS — K760 Fatty (change of) liver, not elsewhere classified: Secondary | ICD-10-CM

## 2014-02-19 DIAGNOSIS — C50412 Malignant neoplasm of upper-outer quadrant of left female breast: Secondary | ICD-10-CM

## 2014-02-19 DIAGNOSIS — C50419 Malignant neoplasm of upper-outer quadrant of unspecified female breast: Secondary | ICD-10-CM | POA: Insufficient documentation

## 2014-02-19 DIAGNOSIS — D696 Thrombocytopenia, unspecified: Secondary | ICD-10-CM

## 2014-02-19 DIAGNOSIS — Z170 Estrogen receptor positive status [ER+]: Secondary | ICD-10-CM

## 2014-02-19 LAB — CBC WITH DIFFERENTIAL/PLATELET
BASO%: 0.5 % (ref 0.0–2.0)
Basophils Absolute: 0 10*3/uL (ref 0.0–0.1)
EOS%: 0.6 % (ref 0.0–7.0)
Eosinophils Absolute: 0 10*3/uL (ref 0.0–0.5)
HEMATOCRIT: 27 % — AB (ref 34.8–46.6)
HGB: 9 g/dL — ABNORMAL LOW (ref 11.6–15.9)
LYMPH%: 33.6 % (ref 14.0–49.7)
MCH: 32.8 pg (ref 25.1–34.0)
MCHC: 33.2 g/dL (ref 31.5–36.0)
MCV: 98.8 fL (ref 79.5–101.0)
MONO#: 0.1 10*3/uL (ref 0.1–0.9)
MONO%: 3.8 % (ref 0.0–14.0)
NEUT%: 61.5 % (ref 38.4–76.8)
NEUTROS ABS: 1.6 10*3/uL (ref 1.5–6.5)
PLATELETS: 20 10*3/uL — AB (ref 145–400)
RBC: 2.73 10*6/uL — ABNORMAL LOW (ref 3.70–5.45)
RDW: 14.3 % (ref 11.2–14.5)
WBC: 2.5 10*3/uL — ABNORMAL LOW (ref 3.9–10.3)
lymph#: 0.9 10*3/uL (ref 0.9–3.3)

## 2014-02-19 LAB — COMPREHENSIVE METABOLIC PANEL (CC13)
ALBUMIN: 3.4 g/dL — AB (ref 3.5–5.0)
ALK PHOS: 106 U/L (ref 40–150)
ALT: 197 U/L — ABNORMAL HIGH (ref 0–55)
ANION GAP: 9 meq/L (ref 3–11)
AST: 71 U/L — ABNORMAL HIGH (ref 5–34)
BILIRUBIN TOTAL: 0.42 mg/dL (ref 0.20–1.20)
BUN: 9.1 mg/dL (ref 7.0–26.0)
CO2: 27 meq/L (ref 22–29)
Calcium: 8.8 mg/dL (ref 8.4–10.4)
Chloride: 106 mEq/L (ref 98–109)
Creatinine: 0.7 mg/dL (ref 0.6–1.1)
GLUCOSE: 91 mg/dL (ref 70–140)
Potassium: 3.6 mEq/L (ref 3.5–5.1)
Sodium: 142 mEq/L (ref 136–145)
Total Protein: 5.9 g/dL — ABNORMAL LOW (ref 6.4–8.3)

## 2014-02-19 MED ORDER — GADOBENATE DIMEGLUMINE 529 MG/ML IV SOLN
17.0000 mL | Freq: Once | INTRAVENOUS | Status: AC | PRN
  Administered 2014-02-19: 17 mL via INTRAVENOUS

## 2014-02-19 NOTE — Telephone Encounter (Signed)
Called pt to inform her of latest MRI results. Pt was unable to come to phone. I relayed let pt know.  the results to her spouse. Communicated with spouse that pt is responding positive to tx, letting her know there has been a decrease in size of mass in the left breast since treatment. Pt's spouse was pleased with results and will let pt know. Message to be forwarded to Charlestine Massed, NP.

## 2014-02-19 NOTE — Progress Notes (Signed)
[facility]  Telephone:(336) [phone] [phone]     ID: Kelsey Mclaughlin OB: 20-Jan-1961  MR#: [id number]  BTD#:[id number]  PCP: Default, Provider, MD GYN:   SU:  OTHER MD:  CHIEF COMPLAINT: left breast cancer CURRENT TREATMENT: neoadjuvant chemotherapy  BREAST CANCER HISTORY: From doctor [name]'s intake note to 10/62/6948:  "Kelsey Mclaughlin is a 53 y.o. female. In 1998 at the age of 53 was diagnosed with left breast cancer in [place]. This was an invasive carcinoma grade 2 stage II. At that time she underwent a lumpectomy with excellent lymph node dissection. She received 6 months of chemotherapy. [name] She also had radiation therapy to the left breast. 2011 patient noticed a lump in the outside of her left breast. She had ultrasound workup performed and was told it was negative no biopsies were performed. General 2014 after patient moved to the [place] she had a mammogram performed this revealed a possible mass in the outer quadrant of the left breast. Ultrasound showed it to be 1.7 cm. MRI of the breasts performed on January 30 revealed the mass to be 1.5 x 2.2 x 1.5 cm. Because of this she also had a biopsy performed on 10/04/2013. The biopsy revealed [[id number]) an invasive ductal carcinoma with ductal carcinoma in situ grade 2. Prognostic panel was positive for estrogen receptor 100% megestrol receptor +33% proliferation marker Ki-67 15% and the tumor was HER-2/neu positive" [with a signals ratio of 3.1 and number per cell 6.3]  The patient's subsequent history is as detailed below  INTERVAL HISTORY: Kelsey Mclaughlin returns today for followup of her breast cancer accompanied by her spouse Kelsey Mclaughlin. Today is day 16 cycle 5 of 6 planned cycles of trastuzumab, pertuzumab, carboplatin and, originally, docetaxel, with gemcitabine substituted for docetaxel after cycle 4 because of neuropathy. The  Gemzar is given on days 1 and 8 of each cycle and the patient received Neulasta on day number  REVIEW OF SYSTEMS: Kelsey Mclaughlin tells me her neuropathy has almost completely resolved. She does feel very tired and achy for 2 or 3 days after the Neulasta, but at the end of the week she starts feeling better and by the final week she feels normal. She knows how to gauge her activity level accordingly. She did have nausea and vomiting this last cycle, x2, and some loose bowel movements secondary to the progenitor. She continues to have some back and joint pain which is not more frequent or intense than prior. She did have one episode of mild to moderate epistaxis when blowing her nose. A detailed review of systems today was otherwise noncontributory   PAST MEDICAL HISTORY: Past Medical History  Diagnosis Date  . Cancer 1998, 2015    breast  . Hypertension   . Bronchitis     completed antibiotics 10/21/13/ states resolved  . Stroke 2004    TIA-  no problems since    PAST SURGICAL HISTORY: Past Surgical History  Procedure Laterality Date  . Breast surgery  1998    lumpectomy on left and removed lymphnodes in [place]  . Portacath placement Right 10/25/2013    Procedure: INSERTION PORT-A-CATH;  Surgeon: Imogene Burn. Georgette Dover, MD;  Location: [facility];  Service: General;  Laterality: Right;    FAMILY HISTORY Family History  Problem Relation Age of Onset  . Breast cancer Mother 24  . Heart disease Father   . Heart attack Father   . Breast  cancer Maternal Grandmother 28  . Brain cancer Maternal Uncle 74    benign brain tumor  . Heart attack Paternal Aunt     GYNECOLOGIC HISTORY:   menarche age 87, first live birth age 58. The patient is GX P2. She went to the change of life approximately 2011.   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 They are in their early  6s. There are no grandchildren. The patient married Kelsey Mclaughlin (both women are originally from [place]) in [place]. The currently live in the [place] area with 5 talks and 10 chickens. They're not church attender's    ADVANCED DIRECTIVES:    HEALTH MAINTENANCE: History  Substance Use Topics  . Smoking status: Never Smoker   . Smokeless tobacco: Never Used  . Alcohol Use: Yes     Comment: 2 drinks/day     Colonoscopy:  PAP:  Bone density:  Lipid panel:  No Known Allergies  Current Outpatient Prescriptions  Medication Sig Dispense Refill  . dexamethasone (DECADRON) 4 MG tablet TAKE 2 TABLETS TWICE DAILY START DAY BEFORE CHEMO THEN AGAIN AFTER CHEMO FOR 3 DAYS  30 tablet  1  . gabapentin (NEURONTIN) 100 MG capsule Take 1 capsule (100 mg total) by mouth 3 (three) times daily.  90 capsule  4  . lidocaine-prilocaine (EMLA) cream Apply to port-a-cath 1 - 2 hours prior to being accessed.  30 g  3  . LORazepam (ATIVAN) 0.5 MG tablet Take 1 tablet (0.5 mg total) by mouth every 6 (six) hours as needed (Nausea or vomiting).  30 tablet  3  . ondansetron (ZOFRAN) 8 MG tablet Take 1 tablet (8 mg total) by mouth 2 (two) times daily. Start the day after chemo for 3 days. Then take as needed for nausea or vomiting.  30 tablet  1  . prochlorperazine (COMPAZINE) 10 MG tablet        No current facility-administered medications for this visit.    OBJECTIVE: middle-aged white woman who appears stated age 14 Vitals:   02/19/14 0834  BP: 109/70  Pulse: 88  Temp: 98.3 F (36.8 C)  Resp: 18     Body mass index is 29.56 kg/(m^2).    ECOG FS:1 - Symptomatic but completely ambulatory  Ocular: Sclerae unicteric, pupils round and equal Ear-nose-throat: Oropharynx clear and moist Lymphatic: No cervical or supraclavicular adenopathy Lungs no rales or rhonchi Heart regular rate and rhythm Abd soft, nontender, positive bowel sounds MSK no focal spinal tenderness, no joint edema Neuro: non-focal,  well-oriented, appropriate affect Breasts:  The right breast is unremarkable. In the upper outer quadrant of the left breast I still feel a 1-1/2 cm movable firm mass. There are no skin or nipple changes of concern. The left axilla is benign  LAB RESULTS:  CMP     Component Value Date/Time   NA 142 02/19/2014 0815   K 3.6 02/19/2014 0815   CO2 27 02/19/2014 0815   GLUCOSE 91 02/19/2014 0815   BUN 9.1 02/19/2014 0815   CREATININE 0.7 02/19/2014 0815   CALCIUM 8.8 02/19/2014 0815   PROT 5.9* 02/19/2014 0815   ALBUMIN 3.4* 02/19/2014 0815   AST 71* 02/19/2014 0815   ALT 197* 02/19/2014 0815   ALKPHOS 106 02/19/2014 0815   BILITOT 0.42 02/19/2014 0815    I No results found for this basename: SPEP,  UPEP,   kappa and lambda light chains    Lab Results  Component Value Date   WBC 2.5* 02/19/2014   NEUTROABS 1.6 02/19/2014   HGB 9.0* 02/19/2014   HCT 27.0* 02/19/2014   MCV 98.8 02/19/2014   PLT 20* 02/19/2014      Chemistry      Component Value Date/Time   NA 142 02/19/2014 0815   K 3.6 02/19/2014 0815   CO2 27 02/19/2014 0815   BUN 9.1 02/19/2014 0815   CREATININE 0.7 02/19/2014 0815      Component Value Date/Time   CALCIUM 8.8 02/19/2014 0815   ALKPHOS 106 02/19/2014 0815   AST 71* 02/19/2014 0815   ALT 197* 02/19/2014 0815   BILITOT 0.42 02/19/2014 0815       No results found for this basename: LABCA2    No components found with this basename: LABCA125    No results found for this basename: INR,  in the last 168 hours  Urinalysis    Component Value Date/Time   LABSPEC 1.005 01/01/2014 0947   GLUCOSEU Negative 01/01/2014 0947   UROBILINOGEN 0.2 01/01/2014 0947    STUDIES: The patient is scheduled for repeat breast MRI 02/19/2049. Echocardiogram needs to be repeated and is being scheduled for the next few days   ASSESSMENT: 53 y.o. BRCA negative [place] woman, [place] speaker   (1) status post left lumpectomy and axillary lymph node dissection in 1998 in [place] for a stage II invasive ductal  carcinoma, grade 2, treated with adjuvant chemotherapy (specific drugs not clear) and adjuvant radiation.  (2) status post left breast upper outer quadrant biopsy 10/04/2013 for a clinically mT2 N0, stage IIA invasive ductal carcinoma, grade 2, estrogen receptor 100% positive, progesterone receptor 33% positive, with an MIB-1 of 15%, and HER-2 amplified; staging studies showed no evidence of metastatic disease  (3) started on neoadjuvant carboplatin/ docetaxel/ trastuzumab/ pertuzumab 11/12/2013  (a) completed 4 cycles as of 05/06/201, after which docetaxel was discontinued because of neuropathy symptoms   (b) gemcitabine to be given day 1 and day 8 with carboplatin in the final 2 cycles  (4) left mastectomy to follow chemotherapy (patient already had radiation to the left breast in past)  (5) aromatase inhibitors to follow surgery  (6) trastuzumab alone to be started 03/18/2014 and continued every 3 weeks to complete one year  (7) genetic testing February 2015 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    PLAN: Barba is doing very well. She tolerates the chemotherapy fine except for the Neulasta. Unfortunately she does need that 1. Her platelets are too low and I am dropping the carboplatin dose as significantly for the last cycle.  I am hoping her MRI today which shows significant improvement, but I still can feel the mass in the left breast, and in general estrogen receptor positive tumors treated with this regimen seldom achieve a complete pathologic response.  Nevertheless I expect her overall prognosis will be good and I would calculate her risk of dying from this tumor at the end of treatment will be less than 5%.  She has an appointment with Dr.Thimmappa later this week to discuss reconstruction. At this point the patient is not planning on bilateral mastectomies but only a left mastectomy. She has an appointment with Dr. Georgette Dover next week to discuss surgical details.  She will start her trastuzumab alone (which can be continued right through surgery) July 6, and she will see me in the last week in July  to review surgical results and make plans for her anastrozole  The patient has a good understanding of the overall plan. She agrees with it. She knows the goal of treatment in her case is cure. She will call with any problems that may develop before her next visit here.    [name], MD   02/19/2014 9:00 AM

## 2014-02-22 ENCOUNTER — Telehealth: Payer: Self-pay | Admitting: Oncology

## 2014-02-22 ENCOUNTER — Encounter (HOSPITAL_COMMUNITY): Payer: Self-pay

## 2014-02-22 NOTE — Telephone Encounter (Signed)
added appts for 7/6 and 7/29. s/w spouse re add ons and echo for [place] 6/15 @ 11am prior to tx. other appts remain the same and pt will get new schedule monday. per linda no preaut.

## 2014-02-24 NOTE — Progress Notes (Signed)
Kelsey Mclaughlin  [phone] [phone]     ID: Kelsey Mclaughlin OB: 1960/10/30  MR#: [id number]  VXB#:[id number]  PCP: Default, Provider, MD GYN:   SU:  OTHER MD:  CHIEF COMPLAINT: Breast cancer, recurrent CURRENT TREATMENT: chemotherapy  BREAST CANCER HISTORY: From doctor Kelsey Mclaughlin's intake note 33/00/7622:  "Kelsey Mclaughlin is a 53 y.o. female. In 1998 at the age of 44 was diagnosed with left breast cancer in [place]. This was an invasive carcinoma grade 2 stage II. At that time she underwent a lumpectomy with excellent lymph node dissection. She received 6 months of chemotherapy. [name] She also had radiation therapy to the left breast. 2011 patient noticed a lump in the outside of her left breast. She had ultrasound workup performed and was told it was negative no biopsies were performed. General 2014 after patient moved to the [place] she had a mammogram performed this revealed a possible mass in the outer quadrant of the left breast. Ultrasound showed it to be 1.7 cm. MRI of the breasts performed on January 30 revealed the mass to be 1.5 x 2.2 x 1.5 cm. Because of this she also had a biopsy performed on 10/04/2013. The biopsy revealed [[id number]) an invasive ductal carcinoma with ductal carcinoma in situ grade 2. Prognostic panel was positive for estrogen receptor 100% megestrol receptor +33% proliferation marker Ki-67 15% and the tumor was HER-2/neu positive" [with a signals ratio of 3.1 and number per cell 6.3]  The patient's subsequent history is as detailed below  INTERVAL HISTORY: Kelsey Mclaughlin returns today for followup of her breast cancer accompanied by her spouse Kelsey Mclaughlin. Today is day 15 cycle 5 of 6 planned cycles of neoadjuvant chemotherapy, initially carboplatin/ docetaxel/ trastuzumab/ pertuzumab, but gemcitabine substituted for the docetaxel in the final 2 cycles because  of concerns regarding neuropathy and swelling.    REVIEW OF SYSTEMS:  Kaori date "much better" with the Gemzar cycle. She still has some neuropathy, but it is "getting better." The swelling has resolved. She has had no significant problems with nausea, vomiting, fatigue, rash, mouth sores, or change in bowel or bladder habits. A detailed review of systems today was otherwise entirely stable  PAST MEDICAL HISTORY: Past Medical History  Diagnosis Date  . Cancer 1998, 2015    breast  . Hypertension   . Bronchitis     completed antibiotics 10/21/13/ states resolved  . Stroke 2004    TIA-  no problems since    PAST SURGICAL HISTORY: Past Surgical History  Procedure Laterality Date  . Breast surgery  1998    lumpectomy on left and removed lymphnodes in [place]  . Portacath placement Right 10/25/2013    Procedure: INSERTION PORT-A-CATH;  Surgeon: Imogene Burn. Georgette Dover, MD;  Location: [facility];  Service: General;  Laterality: Right;    FAMILY HISTORY Family History  Problem Relation Age of Onset  . Breast cancer Mother 7  . Heart disease Father   . Heart attack Father   . Breast cancer Maternal Grandmother 28  . Brain cancer Maternal Uncle 74    benign brain tumor  . Heart attack Paternal Aunt     GYNECOLOGIC HISTORY:   menarche age 42, first live birth age 80. The patient is GX P2. She went to the change of life approximately 2011.   SOCIAL HISTORY:  The patient worked previously as a Marine scientist. She has 2 children both of whom  live in [place], works for the [employer], and Kelsey Mclaughlin, is a Librarian, academic. They are in their early 3s. There are no grandchildren. The patient married Kelsey Mclaughlin (both women are originally from [place]) in [place]. [place] 5 talks and 10 chickens. They're not church attender's    ADVANCED DIRECTIVES:    HEALTH MAINTENANCE: History  Substance Use Topics  . Smoking status: Never Smoker   . Smokeless tobacco:  Never Used  . Alcohol Use: Yes     Comment: 2 drinks/day     Colonoscopy:  PAP:  Bone density:  Lipid panel:  No Known Allergies  Current Outpatient Prescriptions  Medication Sig Dispense Refill  . dexamethasone (DECADRON) 4 MG tablet TAKE 2 TABLETS TWICE DAILY START DAY BEFORE CHEMO THEN AGAIN AFTER CHEMO FOR 3 DAYS  30 tablet  1  . gabapentin (NEURONTIN) 100 MG capsule Take 1 capsule (100 mg total) by mouth 3 (three) times daily.  90 capsule  4  . lidocaine-prilocaine (EMLA) cream Apply to port-a-cath 1 - 2 hours prior to being accessed.  30 g  3  . LORazepam (ATIVAN) 0.5 MG tablet Take 1 tablet (0.5 mg total) by mouth every 6 (six) hours as needed (Nausea or vomiting).  30 tablet  3  . ondansetron (ZOFRAN) 8 MG tablet Take 1 tablet (8 mg total) by mouth 2 (two) times daily. Start the day after chemo for 3 days. Then take as needed for nausea or vomiting.  30 tablet  1  . prochlorperazine (COMPAZINE) 10 MG tablet        No current facility-administered medications for this visit.    OBJECTIVE: middle-aged white woman who appears stated age 16 Vitals:   02/19/14 0834  BP: 109/70  Pulse: 88  Temp: 98.3 F (36.8 C)  Resp: 18     Body mass index is 29.56 kg/(m^2).    ECOG FS:1 - Symptomatic but completely ambulatory  Ocular: Sclerae unicteric, EOMs intact Ear-nose-throat: Oropharynx clear and moist Lymphatic: No cervical or supraclavicular adenopathy Lungs no rales or rhonchi Heart regular rate and rhythm Abd soft, nontender, positive bowel sounds MSK no focal spinal tenderness, no joint edema Neuro: non-focal, well-oriented, appropriate affect Breasts: The right breast is unremarkable. I do not palpate a well-defined mass in the left breast. There are no skin or nipple changes of concern. The left axilla is benign   LAB RESULTS:  CMP     Component Value Date/Time   NA 142 02/19/2014 0815   K 3.6 02/19/2014 0815   CO2 27 02/19/2014 0815   GLUCOSE 91 02/19/2014 0815    BUN 9.1 02/19/2014 0815   CREATININE 0.7 02/19/2014 0815   CALCIUM 8.8 02/19/2014 0815   PROT 5.9* 02/19/2014 0815   ALBUMIN 3.4* 02/19/2014 0815   AST 71* 02/19/2014 0815   ALT 197* 02/19/2014 0815   ALKPHOS 106 02/19/2014 0815   BILITOT 0.42 02/19/2014 0815    I No results found for this basename: SPEP,  UPEP,   kappa and lambda light chains    Lab Results  Component Value Date   WBC 2.5* 02/19/2014   NEUTROABS 1.6 02/19/2014   HGB 9.0* 02/19/2014   HCT 27.0* 02/19/2014   MCV 98.8 02/19/2014   PLT 20* 02/19/2014      Chemistry      Component Value Date/Time   NA 142 02/19/2014 0815   K 3.6 02/19/2014 0815   CO2 27 02/19/2014 0815  BUN 9.1 02/19/2014 0815   CREATININE 0.7 02/19/2014 0815      Component Value Date/Time   CALCIUM 8.8 02/19/2014 0815   ALKPHOS 106 02/19/2014 0815   AST 71* 02/19/2014 0815   ALT 197* 02/19/2014 0815   BILITOT 0.42 02/19/2014 0815       No results found for this basename: LABCA2    No components found with this basename: LABCA125    No results found for this basename: INR,  in the last 168 hours  Urinalysis    Component Value Date/Time   LABSPEC 1.005 01/01/2014 0947   GLUCOSEU Negative 01/01/2014 0947   UROBILINOGEN 0.2 01/01/2014 0947    STUDIES: Mr Breast Bilateral W Wo Contrast  02/19/2014   CLINICAL DATA:  54 year old female with a history of left breast cancer diagnosed at age 77 in 80 in [place]. She underwent lumpectomy, lymph node dissection, and chemotherapy at that time. She has a history of previous radiation therapy to the left breast in 2011. She moved to [place] in 2014 and recent evaluation demonstrated a mass within the left breast located at the 1 o'clock position. Ultrasound-guided core biopsy 10/04/2013 demonstrated invasive ductal carcinoma. She has undergone neoadjuvant chemotherapy. Assess response to neoadjuvant therapy.  EXAM: BILATERAL BREAST MRI WITH AND WITHOUT CONTRAST  TECHNIQUE: Multiplanar, multisequence MR images of both breasts were  obtained prior to and following the intravenous administration of 3m of MultiHance  THREE-DIMENSIONAL MR IMAGE RENDERING ON INDEPENDENT WORKSTATION:  Three-dimensional MR images were rendered by post-processing of the original MR data on an independent workstation. The three-dimensional MR images were interpreted, and findings are reported in the following complete MRI report for this study. Three dimensional images were evaluated at the independent DynaCad workstation  COMPARISON:  Previous breast MRI dated 10/12/2013.  FINDINGS: Breast composition: b.  Scattered fibroglandular tissue.  Background parenchymal enhancement: Mild  Right breast: No mass or abnormal enhancement.  Left breast: The previously seen 2.2 cm area of irregular enhancement with central clip artifact located within the anterior portion of the upper-outer quadrant of the left breast corresponding to the recently diagnosed invasive ductal carcinoma has markedly decreased in size and now only a faint area of vague enhancement remains in this region. The previously described irregular mass located 1 cm posterior to this area of enhancement has also decreased in size and now measures 9 x 7 x 4 mm in size and previously measured 1.4 x 1.2 x 0.8 cm in size. There are no new foci within the left breast.  Lymph nodes: No abnormal appearing lymph nodes.  Ancillary findings:  None.  IMPRESSION: Significant improvement in the areas of enhancement located within the upper outer quadrant of the left breast as discussed above. Otherwise, no significant change.  RECOMMENDATION: Treatment plan.  BI-RADS CATEGORY  6: Known biopsy-proven malignancy.   Electronically Signed   By: RLuberta RobertsonM.D.   On: 02/19/2014 15:20    ASSESSMENT: 53y.o. BRCA negative [place], [place]   (1) status post left [place], grade 2, treated with adjuvant  chemotherapy (specific drugs not clear) and adjuvant radiation.  (2) status post left breast upper outer quadrant biopsy 10/04/2013 for a clinically mT2 N0, stage IIA invasive ductal carcinoma, grade 2, estrogen receptor 100% positive, progesterone receptor 33% positive, with an MIB-1 of 15%, and HER-2 amplified; staging studies showed no evidence of metastatic disease  (3)  started on neoadjuvant carboplatin/ docetaxel/ trastuzumab/ pertuzumab 11/12/2013  (a) completed 4 cycles as of 05/06/201, after which docetaxel was discontinued because of neuropathy symptoms   (b) gemcitabine to be given day 1 and day 8 with carboplatin day 1 in the final 2 cycles  (4) surgery to follow chemotherapy  (5) aromatase inhibitors to follow surgery  (6) genetic testing February 2015 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    PLAN: Glorya is tolerating the new chemotherapy quite well. The results of the repeat breast MRI are very encouraging. I am scheduling her for a repeat echocardiogram later this week. She will return for the start of her final cycle on 02/25/2014 and receive her last dose of chemotherapy 03/04/2014. After that she will continue trastuzumab alone for one year.  She already has an appointment with Dr.Tsuei for 03/01/2014, and I expect she will proceed to surgery some time early July of this year. She will see me again late July to discuss results and we will likely start anastrozole at that time.  Jillyn has a good understanding of the overall plan. She agrees with it. She knows the goal of treatment in her case is cure. She will call with any problems that may develop before her next visit here.  [name], MD   02/24/2014 12:33 PM

## 2014-02-25 ENCOUNTER — Ambulatory Visit: Payer: Federal, State, Local not specified - PPO

## 2014-02-25 ENCOUNTER — Other Ambulatory Visit (HOSPITAL_BASED_OUTPATIENT_CLINIC_OR_DEPARTMENT_OTHER): Payer: Federal, State, Local not specified - PPO

## 2014-02-25 ENCOUNTER — Ambulatory Visit (HOSPITAL_COMMUNITY)
Admission: RE | Admit: 2014-02-25 | Discharge: 2014-02-25 | Disposition: A | Discharge status: Home / Self Care | Payer: Federal, State, Local not specified - PPO | Source: Ambulatory Visit | Attending: Oncology | Admitting: Oncology

## 2014-02-25 ENCOUNTER — Other Ambulatory Visit: Payer: Self-pay | Admitting: *Deleted

## 2014-02-25 ENCOUNTER — Ambulatory Visit (HOSPITAL_BASED_OUTPATIENT_CLINIC_OR_DEPARTMENT_OTHER): Payer: Federal, State, Local not specified - PPO

## 2014-02-25 VITALS — BP 129/73 | HR 68 | Temp 98.3°F | Resp 18

## 2014-02-25 DIAGNOSIS — Z8249 Family history of ischemic heart disease and other diseases of the circulatory system: Secondary | ICD-10-CM | POA: Insufficient documentation

## 2014-02-25 DIAGNOSIS — C50419 Malignant neoplasm of upper-outer quadrant of unspecified female breast: Secondary | ICD-10-CM

## 2014-02-25 DIAGNOSIS — C50412 Malignant neoplasm of upper-outer quadrant of left female breast: Secondary | ICD-10-CM

## 2014-02-25 DIAGNOSIS — I890 Lymphedema, not elsewhere classified: Secondary | ICD-10-CM | POA: Insufficient documentation

## 2014-02-25 DIAGNOSIS — Z95828 Presence of other vascular implants and grafts: Secondary | ICD-10-CM

## 2014-02-25 DIAGNOSIS — I10 Essential (primary) hypertension: Secondary | ICD-10-CM | POA: Insufficient documentation

## 2014-02-25 DIAGNOSIS — K760 Fatty (change of) liver, not elsewhere classified: Secondary | ICD-10-CM

## 2014-02-25 DIAGNOSIS — C50919 Malignant neoplasm of unspecified site of unspecified female breast: Secondary | ICD-10-CM | POA: Insufficient documentation

## 2014-02-25 DIAGNOSIS — Z5112 Encounter for antineoplastic immunotherapy: Secondary | ICD-10-CM

## 2014-02-25 DIAGNOSIS — Z5111 Encounter for antineoplastic chemotherapy: Secondary | ICD-10-CM

## 2014-02-25 DIAGNOSIS — I517 Cardiomegaly: Secondary | ICD-10-CM

## 2014-02-25 DIAGNOSIS — K7689 Other specified diseases of liver: Secondary | ICD-10-CM | POA: Insufficient documentation

## 2014-02-25 LAB — COMPREHENSIVE METABOLIC PANEL (CC13)
ALBUMIN: 3.4 g/dL — AB (ref 3.5–5.0)
ALT: 93 U/L — AB (ref 0–55)
AST: 43 U/L — AB (ref 5–34)
Alkaline Phosphatase: 103 U/L (ref 40–150)
Anion Gap: 8 mEq/L (ref 3–11)
BUN: 4.6 mg/dL — ABNORMAL LOW (ref 7.0–26.0)
CALCIUM: 9.1 mg/dL (ref 8.4–10.4)
CO2: 27 meq/L (ref 22–29)
CREATININE: 0.7 mg/dL (ref 0.6–1.1)
Chloride: 108 mEq/L (ref 98–109)
Glucose: 97 mg/dl (ref 70–140)
POTASSIUM: 3.9 meq/L (ref 3.5–5.1)
SODIUM: 143 meq/L (ref 136–145)
TOTAL PROTEIN: 5.8 g/dL — AB (ref 6.4–8.3)
Total Bilirubin: 0.22 mg/dL (ref 0.20–1.20)

## 2014-02-25 LAB — CBC WITH DIFFERENTIAL/PLATELET
BASO%: 0.6 % (ref 0.0–2.0)
Basophils Absolute: 0 10*3/uL (ref 0.0–0.1)
EOS%: 0.7 % (ref 0.0–7.0)
Eosinophils Absolute: 0.1 10*3/uL (ref 0.0–0.5)
HCT: 25.8 % — ABNORMAL LOW (ref 34.8–46.6)
HGB: 8.5 g/dL — ABNORMAL LOW (ref 11.6–15.9)
LYMPH%: 16.2 % (ref 14.0–49.7)
MCH: 33.5 pg (ref 25.1–34.0)
MCHC: 33.1 g/dL (ref 31.5–36.0)
MCV: 101.2 fL — ABNORMAL HIGH (ref 79.5–101.0)
MONO#: 0.9 10*3/uL (ref 0.1–0.9)
MONO%: 12.2 % (ref 0.0–14.0)
NEUT#: 5.5 10*3/uL (ref 1.5–6.5)
NEUT%: 70.3 % (ref 38.4–76.8)
Platelets: 294 10*3/uL (ref 145–400)
RBC: 2.55 10*6/uL — ABNORMAL LOW (ref 3.70–5.45)
RDW: 15.1 % — AB (ref 11.2–14.5)
WBC: 7.8 10*3/uL (ref 3.9–10.3)
lymph#: 1.3 10*3/uL (ref 0.9–3.3)

## 2014-02-25 MED ORDER — DIPHENHYDRAMINE HCL 25 MG PO CAPS
ORAL_CAPSULE | ORAL | Status: AC
  Filled 2014-02-25: qty 2

## 2014-02-25 MED ORDER — SODIUM CHLORIDE 0.9 % IV SOLN
800.0000 mg/m2 | Freq: Once | INTRAVENOUS | Status: AC
  Administered 2014-02-25: 1558 mg via INTRAVENOUS
  Filled 2014-02-25: qty 40.98

## 2014-02-25 MED ORDER — ACETAMINOPHEN 325 MG PO TABS
ORAL_TABLET | ORAL | Status: AC
  Filled 2014-02-25: qty 2

## 2014-02-25 MED ORDER — TRASTUZUMAB CHEMO INJECTION 440 MG
6.0000 mg/kg | Freq: Once | INTRAVENOUS | Status: AC
  Administered 2014-02-25: 483 mg via INTRAVENOUS
  Filled 2014-02-25: qty 23

## 2014-02-25 MED ORDER — DEXAMETHASONE SODIUM PHOSPHATE 10 MG/ML IJ SOLN
10.0000 mg | Freq: Once | INTRAMUSCULAR | Status: AC
  Administered 2014-02-25: 10 mg via INTRAVENOUS

## 2014-02-25 MED ORDER — ONDANSETRON 8 MG/NS 50 ML IVPB
INTRAVENOUS | Status: AC
  Filled 2014-02-25: qty 8

## 2014-02-25 MED ORDER — SODIUM CHLORIDE 0.9 % IJ SOLN
10.0000 mL | INTRAMUSCULAR | Status: DC | PRN
  Administered 2014-02-25: 10 mL
  Filled 2014-02-25: qty 10

## 2014-02-25 MED ORDER — SODIUM CHLORIDE 0.9 % IV SOLN
Freq: Once | INTRAVENOUS | Status: AC
  Administered 2014-02-25: 13:00:00 via INTRAVENOUS

## 2014-02-25 MED ORDER — ONDANSETRON 8 MG/50ML IVPB (CHCC)
8.0000 mg | Freq: Once | INTRAVENOUS | Status: AC
  Administered 2014-02-25: 8 mg via INTRAVENOUS

## 2014-02-25 MED ORDER — ACETAMINOPHEN 325 MG PO TABS
650.0000 mg | ORAL_TABLET | Freq: Once | ORAL | Status: AC
  Administered 2014-02-25: 650 mg via ORAL

## 2014-02-25 MED ORDER — HEPARIN SOD (PORK) LOCK FLUSH 100 UNIT/ML IV SOLN
500.0000 [IU] | Freq: Once | INTRAVENOUS | Status: AC | PRN
  Administered 2014-02-25: 500 [IU]
  Filled 2014-02-25: qty 5

## 2014-02-25 MED ORDER — SODIUM CHLORIDE 0.9 % IV SOLN
514.0000 mg | Freq: Once | INTRAVENOUS | Status: AC
  Administered 2014-02-25: 510 mg via INTRAVENOUS
  Filled 2014-02-25: qty 51

## 2014-02-25 MED ORDER — SODIUM CHLORIDE 0.9 % IJ SOLN
10.0000 mL | INTRAMUSCULAR | Status: DC | PRN
  Administered 2014-02-25: 10 mL via INTRAVENOUS
  Filled 2014-02-25: qty 10

## 2014-02-25 MED ORDER — SODIUM CHLORIDE 0.9 % IV SOLN
420.0000 mg | Freq: Once | INTRAVENOUS | Status: AC
  Administered 2014-02-25: 420 mg via INTRAVENOUS
  Filled 2014-02-25: qty 14

## 2014-02-25 MED ORDER — DIPHENHYDRAMINE HCL 25 MG PO CAPS
50.0000 mg | ORAL_CAPSULE | Freq: Once | ORAL | Status: AC
  Administered 2014-02-25: 50 mg via ORAL

## 2014-02-25 MED ORDER — DEXAMETHASONE SODIUM PHOSPHATE 10 MG/ML IJ SOLN
INTRAMUSCULAR | Status: AC
  Filled 2014-02-25: qty 1

## 2014-02-25 NOTE — Patient Instructions (Signed)

## 2014-02-25 NOTE — Progress Notes (Signed)
Upon assessing patient, it was noted that bilateral legs were swollen. Per pt, they have been swollen for 3 days. There is no pain in either leg. Dr. Jana Hakim notified of this - okay to proceed with treatment today. Patient educated to keep legs elevated above her heart anytime she is sitting down and to call the office ASAP if they get worse. Patient and spouse agreeable to this.

## 2014-02-25 NOTE — Progress Notes (Signed)
  Echocardiogram 2D Echocardiogram has been performed.  Hicks Feick 02/25/2014, 12:01 PM

## 2014-02-25 NOTE — Patient Instructions (Signed)
[facility] Discharge Instructions for Patients Receiving Chemotherapy  Today you received the following chemotherapy agents: Herceptin, Perjeta, Gemzar, Carboplatin   To help prevent nausea and vomiting after your treatment, we encourage you to take your nausea medication as prescribed.    If you develop nausea and vomiting that is not controlled by your nausea medication, call the clinic.   BELOW ARE SYMPTOMS THAT SHOULD BE REPORTED IMMEDIATELY:  *FEVER GREATER THAN 100.5 F  *CHILLS WITH OR WITHOUT FEVER  NAUSEA AND VOMITING THAT IS NOT CONTROLLED WITH YOUR NAUSEA MEDICATION  *UNUSUAL SHORTNESS OF BREATH  *UNUSUAL BRUISING OR BLEEDING  TENDERNESS IN MOUTH AND THROAT WITH OR WITHOUT PRESENCE OF ULCERS  *URINARY PROBLEMS  *BOWEL PROBLEMS  UNUSUAL RASH Items with * indicate a potential emergency and should be followed up as soon as possible.  Feel free to call the clinic you have any questions or concerns. The clinic phone number is [phone].

## 2014-02-26 ENCOUNTER — Telehealth: Payer: Self-pay | Admitting: Oncology

## 2014-02-26 NOTE — Telephone Encounter (Signed)
per pof to cancel pt inj 6/30-cld pt and another woman answered statting was spouse & I should talk w/her-adv due to HIPPA I could not give reason or any other info on acct to her only pat- Spoke to pt and adv inj hd been cX for 6/30-adv to call Val in am for reason-pt understood

## 2014-02-27 ENCOUNTER — Other Ambulatory Visit: Payer: Self-pay | Admitting: Oncology

## 2014-03-01 ENCOUNTER — Ambulatory Visit (INDEPENDENT_AMBULATORY_CARE_PROVIDER_SITE_OTHER): Payer: Federal, State, Local not specified - PPO | Admitting: Surgery

## 2014-03-01 ENCOUNTER — Encounter (INDEPENDENT_AMBULATORY_CARE_PROVIDER_SITE_OTHER): Payer: Self-pay | Admitting: Surgery

## 2014-03-01 VITALS — BP 134/76 | HR 74 | Temp 98.0°F | Ht 67.0 in | Wt 187.0 lb

## 2014-03-01 DIAGNOSIS — C50412 Malignant neoplasm of upper-outer quadrant of left female breast: Secondary | ICD-10-CM

## 2014-03-01 DIAGNOSIS — C50419 Malignant neoplasm of upper-outer quadrant of unspecified female breast: Secondary | ICD-10-CM

## 2014-03-01 NOTE — Progress Notes (Signed)
Patient ID: Kelsey Mclaughlin, female   DOB: January 07, 1961, 53 y.o.   MRN: [id number]  Chief Complaint  Patient presents with  . Breast Cancer Long Term Follow Up    HPI Kelsey Mclaughlin is a 53 y.o. female.  Follow-up for recurrent left breast cancer HPI This is a 53 yo female that presents after diagnosis of left breast cancer at age 77.  This was treated in [place] with lumpectomy and axillary lymph node dissection.  She received 6 months of chemotherapy as well as radiation therapy.  She presented in 2011 with a palpable mass in the left upper outer quadrant. She had mammograms and ultrasounds performed of this area but no biopsies were ever performed. Recently she moved to [facility]. She underwent screening mammogram in December and was brought back for a diagnostic left mammogram for left breast asymmetry on 10/02/13. At 1:00 in the left breast about 2 cm from the nipple there is an irregular hypoechoic mass measuring 1 x 0.4 x 1.7 cm. 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 ER and PR are positive Ki-67 15%.  MRI showed the recurrent carcinoma, as well as a suspicious mass 1 cm posterior to the biopsy site.    She underwent port placement and begin neoadjuvant chemotherapy. She is due to complete her chemotherapy in early July. She is also undergoing physical therapy for her left arm lymphedema. Initially, she felt that she wanted bilateral mastectomies. However she underwent genetic counseling and testing and does not carry a mutation. After further counseling and she understands that there is no survival benefit to a right mastectomy. She now has decided she would like a left mastectomy with immediate reconstruction. She has had consultations with 2 different plastic surgeons and wishes to proceed with Dr. Renold Genta.  According to the last note, she will probably proceed with a tissue expander with  delayed autologous reconstruction. However this decision has yet to be finalized.  Recent MRI showed significant improvement in the area of the primary tumor.  She comes in today to discuss surgical planning.  She is not having any problems with her right subclavian port.    Past Medical History  Diagnosis Date  . Cancer 1998, 2015    breast  . Hypertension   . Bronchitis     completed antibiotics 10/21/13/ states resolved  . Stroke 2004    TIA-  no problems since    Past Surgical History  Procedure Laterality Date  . Breast surgery  1998    lumpectomy on left and removed lymphnodes in [place]  . Portacath placement Right 10/25/2013    Procedure: INSERTION PORT-A-CATH;  Surgeon: Imogene Burn. Georgette Dover, MD;  Location: [facility];  Service: General;  Laterality: Right;    Family History  Problem Relation Age of Onset  . Breast cancer Mother 80  . Heart disease Father   . Heart attack Father   . Breast cancer Maternal Grandmother 2  . Brain cancer Maternal Uncle 74    benign brain tumor  . Heart attack Paternal Aunt     Social History History  Substance Use Topics  . Smoking status: Never Smoker   . Smokeless tobacco: Never Used  . Alcohol Use: Yes     Comment: 2 drinks/day    No Known Allergies  Current Outpatient Prescriptions  Medication Sig Dispense Refill  . dexamethasone (DECADRON) 4 MG tablet TAKE 2 TABLETS TWICE DAILY START  DAY BEFORE CHEMO THEN AGAIN AFTER CHEMO FOR 3 DAYS  30 tablet  1  . lidocaine-prilocaine (EMLA) cream Apply to port-a-cath 1 - 2 hours prior to being accessed.  30 g  3   No current facility-administered medications for this visit.    Review of Systems Review of Systems  Constitutional: Negative for fever, chills and unexpected weight change.  HENT: Negative for congestion, hearing loss, sore throat, trouble swallowing and voice change.   Eyes: Negative for visual disturbance.  Respiratory: Negative for cough and wheezing.   Cardiovascular:  Negative for chest pain, palpitations and leg swelling.  Gastrointestinal: Negative for nausea, vomiting, abdominal pain, diarrhea, constipation, blood in stool, abdominal distention and anal bleeding.  Genitourinary: Negative for hematuria, vaginal bleeding and difficulty urinating.  Musculoskeletal: Negative for arthralgias.  Skin: Negative for rash and wound.  Neurological: Negative for seizures, syncope and headaches.       Peripheral neuropathy  Hematological: Negative for adenopathy. Does not bruise/bleed easily.       Left arm lymphedema  Psychiatric/Behavioral: Negative for confusion.    Blood pressure 134/76, pulse 74, temperature 98 F (36.7 C), height _0  (1.702 m), weight 187 lb (84.823 kg).  Physical Exam Physical Exam WDWN in NAD HEENT:  EOMI, sclera anicteric Neck:  No masses, no thyromegaly Breasts:  No right breast masses; no axillary lymphadenopathy Smaller LUOQ mass, softer; healed left axillary scar Lungs:  CTA bilaterally; normal respiratory effort CV:  Regular rate and rhythm; no murmurs Abd:  +bowel sounds, soft, non-tender, no masses Ext:  Well-perfused; no edema Skin:  Warm, dry; no sign of jaundice  Data Reviewed Mr Breast Bilateral W Wo Contrast  02/19/2014 CLINICAL DATA: 53 year old female with a history of left breast cancer diagnosed at age 19 in 51 in [place]. She underwent lumpectomy, lymph node dissection, and chemotherapy at that time. She has a history of previous radiation therapy to the left breast in 2011. She moved to [place] in 2014 and recent evaluation demonstrated a mass within the left breast located at the 1 o'clock position. Ultrasound-guided core biopsy 10/04/2013 demonstrated invasive ductal carcinoma. She has undergone neoadjuvant chemotherapy. Assess response to neoadjuvant therapy. EXAM: BILATERAL BREAST MRI WITH AND WITHOUT CONTRAST TECHNIQUE: Multiplanar, multisequence MR images of both breasts were obtained prior to and  following the intravenous administration of 34m of MultiHance THREE-DIMENSIONAL MR IMAGE RENDERING ON INDEPENDENT WORKSTATION: Three-dimensional MR images were rendered by post-processing of the original MR data on an independent workstation. The three-dimensional MR images were interpreted, and findings are reported in the following complete MRI report for this study. Three dimensional images were evaluated at the independent DynaCad workstation COMPARISON: Previous breast MRI dated 10/12/2013. FINDINGS: Breast composition: b. Scattered fibroglandular tissue. Background parenchymal enhancement: Mild Right breast: No mass or abnormal enhancement. Left breast: The previously seen 2.2 cm area of irregular enhancement with central clip artifact located within the anterior portion of the upper-outer quadrant of the left breast corresponding to the recently diagnosed invasive ductal carcinoma has markedly decreased in size and now only a faint area of vague enhancement remains in this region. The previously described irregular mass located 1 cm posterior to this area of enhancement has also decreased in size and now measures 9 x 7 x 4 mm in size and previously measured 1.4 x 1.2 x 0.8 cm in size. There are no new foci within the left breast. Lymph nodes: No abnormal appearing lymph nodes. Ancillary findings: None. IMPRESSION: Significant improvement in the areas  of enhancement located within the upper outer quadrant of the left breast as discussed above. Otherwise, no significant change. RECOMMENDATION: Treatment plan. BI-RADS CATEGORY 6: Known biopsy-proven malignancy. Electronically Signed By: Luberta Robertson M.D. On: 02/19/2014 15:20    Assessment    mT2 N0, stage IIA invasive ductal carcinoma, grade 2, estrogen receptor 100% positive, progesterone receptor 33% positive, with an MIB-1 of 15%, and HER-2 amplified; staging studies showed no evidence of metastatic disease     Plan    Left mastectomy with  immediate reconstruction by Dr. Renold Genta.  The patient will have another consultation with Dr. Renold Genta to finalize their surgical plan.  The surgical procedure has been discussed with the patient.  Potential risks, benefits, alternative treatments, and expected outcomes have been explained.  All of the patient's questions at this time have been answered.  The likelihood of reaching the patient's treatment goal is good.  The patient understand the proposed surgical procedure and wishes to proceed.  We will schedule this for late July or early August after the patient has completed her chemotherapy.         Annet Manukyan K. 03/01/2014, 11:59 AM

## 2014-03-04 ENCOUNTER — Ambulatory Visit (HOSPITAL_BASED_OUTPATIENT_CLINIC_OR_DEPARTMENT_OTHER): Payer: Federal, State, Local not specified - PPO

## 2014-03-04 ENCOUNTER — Other Ambulatory Visit: Payer: Self-pay | Admitting: Physician Assistant

## 2014-03-04 ENCOUNTER — Other Ambulatory Visit (HOSPITAL_BASED_OUTPATIENT_CLINIC_OR_DEPARTMENT_OTHER): Payer: Federal, State, Local not specified - PPO

## 2014-03-04 ENCOUNTER — Ambulatory Visit (HOSPITAL_BASED_OUTPATIENT_CLINIC_OR_DEPARTMENT_OTHER): Payer: Federal, State, Local not specified - PPO | Admitting: Physician Assistant

## 2014-03-04 ENCOUNTER — Encounter: Payer: Self-pay | Admitting: Physician Assistant

## 2014-03-04 ENCOUNTER — Other Ambulatory Visit: Payer: Self-pay | Admitting: Hematology and Oncology

## 2014-03-04 VITALS — BP 119/49 | HR 71 | Temp 98.2°F | Resp 18 | Ht 67.0 in | Wt 186.2 lb

## 2014-03-04 DIAGNOSIS — Z853 Personal history of malignant neoplasm of breast: Secondary | ICD-10-CM

## 2014-03-04 DIAGNOSIS — C50412 Malignant neoplasm of upper-outer quadrant of left female breast: Secondary | ICD-10-CM

## 2014-03-04 DIAGNOSIS — C50419 Malignant neoplasm of upper-outer quadrant of unspecified female breast: Secondary | ICD-10-CM

## 2014-03-04 DIAGNOSIS — Z170 Estrogen receptor positive status [ER+]: Secondary | ICD-10-CM

## 2014-03-04 DIAGNOSIS — Z5111 Encounter for antineoplastic chemotherapy: Secondary | ICD-10-CM

## 2014-03-04 LAB — CBC WITH DIFFERENTIAL/PLATELET
BASO%: 0.7 % (ref 0.0–2.0)
Basophils Absolute: 0 10*3/uL (ref 0.0–0.1)
EOS ABS: 0 10*3/uL (ref 0.0–0.5)
EOS%: 0.9 % (ref 0.0–7.0)
HEMATOCRIT: 24.1 % — AB (ref 34.8–46.6)
HGB: 8 g/dL — ABNORMAL LOW (ref 11.6–15.9)
LYMPH#: 1 10*3/uL (ref 0.9–3.3)
LYMPH%: 30.7 % (ref 14.0–49.7)
MCH: 33.7 pg (ref 25.1–34.0)
MCHC: 33.3 g/dL (ref 31.5–36.0)
MCV: 101 fL (ref 79.5–101.0)
MONO#: 0.3 10*3/uL (ref 0.1–0.9)
MONO%: 8.3 % (ref 0.0–14.0)
NEUT%: 59.4 % (ref 38.4–76.8)
NEUTROS ABS: 2 10*3/uL (ref 1.5–6.5)
Platelets: 352 10*3/uL (ref 145–400)
RBC: 2.38 10*6/uL — AB (ref 3.70–5.45)
RDW: 15.1 % — ABNORMAL HIGH (ref 11.2–14.5)
WBC: 3.3 10*3/uL — ABNORMAL LOW (ref 3.9–10.3)

## 2014-03-04 LAB — COMPREHENSIVE METABOLIC PANEL (CC13)
ALT: 202 U/L — AB (ref 0–55)
ANION GAP: 7 meq/L (ref 3–11)
AST: 89 U/L — ABNORMAL HIGH (ref 5–34)
Albumin: 3.5 g/dL (ref 3.5–5.0)
Alkaline Phosphatase: 83 U/L (ref 40–150)
BILIRUBIN TOTAL: 0.25 mg/dL (ref 0.20–1.20)
BUN: 5.9 mg/dL — ABNORMAL LOW (ref 7.0–26.0)
CHLORIDE: 107 meq/L (ref 98–109)
CO2: 29 meq/L (ref 22–29)
Calcium: 9.3 mg/dL (ref 8.4–10.4)
Creatinine: 0.7 mg/dL (ref 0.6–1.1)
GLUCOSE: 119 mg/dL (ref 70–140)
Potassium: 4.2 mEq/L (ref 3.5–5.1)
SODIUM: 143 meq/L (ref 136–145)
Total Protein: 6.1 g/dL — ABNORMAL LOW (ref 6.4–8.3)

## 2014-03-04 MED ORDER — SODIUM CHLORIDE 0.9 % IJ SOLN
10.0000 mL | INTRAMUSCULAR | Status: DC | PRN
  Administered 2014-03-04: 10 mL
  Filled 2014-03-04: qty 10

## 2014-03-04 MED ORDER — ONDANSETRON 8 MG/NS 50 ML IVPB
INTRAVENOUS | Status: AC
  Filled 2014-03-04: qty 8

## 2014-03-04 MED ORDER — SODIUM CHLORIDE 0.9 % IV SOLN
Freq: Once | INTRAVENOUS | Status: AC
  Administered 2014-03-04: 12:00:00 via INTRAVENOUS

## 2014-03-04 MED ORDER — SODIUM CHLORIDE 0.9 % IV SOLN
800.0000 mg/m2 | Freq: Once | INTRAVENOUS | Status: AC
  Administered 2014-03-04: 1558 mg via INTRAVENOUS
  Filled 2014-03-04: qty 40.98

## 2014-03-04 MED ORDER — DEXAMETHASONE SODIUM PHOSPHATE 10 MG/ML IJ SOLN
10.0000 mg | Freq: Once | INTRAMUSCULAR | Status: AC
  Administered 2014-03-04: 10 mg via INTRAVENOUS

## 2014-03-04 MED ORDER — DEXAMETHASONE SODIUM PHOSPHATE 10 MG/ML IJ SOLN
INTRAMUSCULAR | Status: AC
  Filled 2014-03-04: qty 1

## 2014-03-04 MED ORDER — HEPARIN SOD (PORK) LOCK FLUSH 100 UNIT/ML IV SOLN
500.0000 [IU] | Freq: Once | INTRAVENOUS | Status: AC | PRN
  Administered 2014-03-04: 500 [IU]
  Filled 2014-03-04: qty 5

## 2014-03-04 MED ORDER — ONDANSETRON 8 MG/50ML IVPB (CHCC)
8.0000 mg | Freq: Once | INTRAVENOUS | Status: AC
  Administered 2014-03-04: 8 mg via INTRAVENOUS

## 2014-03-04 NOTE — Patient Instructions (Signed)
Continue with labs and chemotherapy as scheduled Followup in one week

## 2014-03-04 NOTE — Patient Instructions (Signed)
[facility] Discharge Instructions for Patients Receiving Chemotherapy  Today you received the following chemotherapy agents Gemzar.  To help prevent nausea and vomiting after your treatment, we encourage you to take your nausea medication.   If you develop nausea and vomiting that is not controlled by your nausea medication, call the clinic.   BELOW ARE SYMPTOMS THAT SHOULD BE REPORTED IMMEDIATELY:  *FEVER GREATER THAN 100.5 F  *CHILLS WITH OR WITHOUT FEVER  NAUSEA AND VOMITING THAT IS NOT CONTROLLED WITH YOUR NAUSEA MEDICATION  *UNUSUAL SHORTNESS OF BREATH  *UNUSUAL BRUISING OR BLEEDING  TENDERNESS IN MOUTH AND THROAT WITH OR WITHOUT PRESENCE OF ULCERS  *URINARY PROBLEMS  *BOWEL PROBLEMS  UNUSUAL RASH Items with * indicate a potential emergency and should be followed up as soon as possible.  Feel free to call the clinic you have any questions or concerns. The clinic phone number is [phone].    

## 2014-03-04 NOTE — Progress Notes (Signed)
Kelsey Mclaughlin  [phone] [phone]     ID: Kelsey Mclaughlin OB: December 23, 1960  MR#: [id number]  VZC#:[id number]  PCP: [name], MD GYN:   SU:  OTHER MD:  CHIEF COMPLAINT: Breast cancer, recurrent CURRENT TREATMENT: chemotherapy  BREAST CANCER HISTORY: From doctor [name]'s intake note 12/87/8676:  "Kelsey Mclaughlin is a 53 y.o. female. In 1998 at the age of 86 was diagnosed with left breast cancer in [place]. This was an invasive carcinoma grade 2 stage II. At that time she underwent a lumpectomy with excellent lymph node dissection. She received 6 months of chemotherapy. [name] She also had radiation therapy to the left breast. 2011 patient noticed a lump in the outside of her left breast. She had ultrasound workup performed and was told it was negative no biopsies were performed. General 2014 after patient moved to the [place] she had a mammogram performed this revealed a possible mass in the outer quadrant of the left breast. Ultrasound showed it to be 1.7 cm. MRI of the breasts performed on January 30 revealed the mass to be 1.5 x 2.2 x 1.5 cm. Because of this she also had a biopsy performed on 10/04/2013. The biopsy revealed [[id number]) an invasive ductal carcinoma with ductal carcinoma in situ grade 2. Prognostic panel was positive for estrogen receptor 100% megestrol receptor +33% proliferation marker Ki-67 15% and the tumor was HER-2/neu positive" [with a signals ratio of 3.1 and number per cell 6.3]  The patient's subsequent history is as detailed below  INTERVAL HISTORY: Kelsey Mclaughlin returns today for followup of her breast cancer accompanied by her spouse Kelsey Mclaughlin. Today is day 8 cycle 6 of 6 planned cycles of neoadjuvant chemotherapy, initially carboplatin/ docetaxel/ trastuzumab/ pertuzumab, but gemcitabine substituted for the docetaxel in the final 2 cycles  because of concerns regarding neuropathy and swelling.    REVIEW OF SYSTEMS:  Kelsey Mclaughlin reports doing "much better" with the Gemzar cycles. She still has some neuropathy, but it is "getting better." She voiced no specific complaints today. She has had no significant problems with nausea, vomiting, fatigue, rash, mouth sores, or change in bowel or bladder habits. A detailed review of systems today was otherwise entirely stable  PAST MEDICAL HISTORY: Past Medical History  Diagnosis Date  . Cancer 1998, 2015    breast  . Hypertension   . Bronchitis     completed antibiotics 10/21/13/ states resolved  . Stroke 2004    TIA-  no problems since    PAST SURGICAL HISTORY: Past Surgical History  Procedure Laterality Date  . Breast surgery  1998    lumpectomy on left and removed lymphnodes in [place]  . Portacath placement Right 10/25/2013    Procedure: INSERTION PORT-A-CATH;  Surgeon: Imogene Burn. Georgette Dover, MD;  Location: [facility];  Service: General;  Laterality: Right;    FAMILY HISTORY Family History  Problem Relation Age of Onset  . Breast cancer Mother 36  . Heart disease Father   . Heart attack Father   . Breast cancer Maternal Grandmother 46  . Brain cancer Maternal Uncle 74    benign brain tumor  . Heart attack Paternal Aunt     GYNECOLOGIC HISTORY:   menarche age 35, first live birth age 80. The patient is GX P2. She went to the change of life approximately 2011.   SOCIAL HISTORY:  The patient worked previously as a Marine scientist. She has 2  children both of whom live in [place], works for the [employer], and Marcello Moores, is a Librarian, academic. They are in their early 48s. There are no grandchildren. The patient married Kelsey Mclaughlin (both women are originally from [place]) in [place]. [place] 5 talks and 10 chickens. They're not church attender's    ADVANCED DIRECTIVES:    HEALTH MAINTENANCE: History  Substance Use Topics  . Smoking status: Never  Smoker   . Smokeless tobacco: Never Used  . Alcohol Use: Yes     Comment: 2 drinks/day     Colonoscopy:  PAP:  Bone density:  Lipid panel:  No Known Allergies  Current Outpatient Prescriptions  Medication Sig Dispense Refill  . dexamethasone (DECADRON) 4 MG tablet TAKE 2 TABLETS TWICE DAILY START DAY BEFORE CHEMO THEN AGAIN AFTER CHEMO FOR 3 DAYS  30 tablet  1  . lidocaine-prilocaine (EMLA) cream Apply to port-a-cath 1 - 2 hours prior to being accessed.  30 g  3   No current facility-administered medications for this visit.   Facility-Administered Medications Ordered in Other Visits  Medication Dose Route Frequency [name] Last Rate Last Dose  . sodium chloride 0.9 % injection 10 mL  10 mL Intracatheter PRN [name], MD   10 mL at 03/04/14 1320    OBJECTIVE: middle-aged white woman who appears stated age 53 Vitals:   03/04/14 1053  BP: 119/49  Pulse: 71  Temp: 98.2 F (36.8 C)  Resp: 18     Body mass index is 29.16 kg/(m^2).    ECOG FS:1 - Symptomatic but completely ambulatory  Ocular: Sclerae unicteric, EOMs intact Ear-nose-throat: Oropharynx clear and moist Lymphatic: No cervical or supraclavicular adenopathy Lungs no rales or rhonchi Heart regular rate and rhythm Abd soft, nontender, positive bowel sounds MSK no focal spinal tenderness, no joint edema Neuro: non-focal, well-oriented, appropriate affect Breasts: Deferred  LAB RESULTS:  CMP     Component Value Date/Time   NA 143 03/04/2014 1042   K 4.2 03/04/2014 1042   CO2 29 03/04/2014 1042   GLUCOSE 119 03/04/2014 1042   BUN 5.9* 03/04/2014 1042   CREATININE 0.7 03/04/2014 1042   CALCIUM 9.3 03/04/2014 1042   PROT 6.1* 03/04/2014 1042   ALBUMIN 3.5 03/04/2014 1042   AST 89* 03/04/2014 1042   ALT 202* 03/04/2014 1042   ALKPHOS 83 03/04/2014 1042   BILITOT 0.25 03/04/2014 1042    I No results found for this basename: SPEP,  UPEP,   kappa and lambda light chains    Lab Results  Component Value  Date   WBC 3.3* 03/04/2014   NEUTROABS 2.0 03/04/2014   HGB 8.0* 03/04/2014   HCT 24.1* 03/04/2014   MCV 101.0 03/04/2014   PLT 352 03/04/2014      Chemistry      Component Value Date/Time   NA 143 03/04/2014 1042   K 4.2 03/04/2014 1042   CO2 29 03/04/2014 1042   BUN 5.9* 03/04/2014 1042   CREATININE 0.7 03/04/2014 1042      Component Value Date/Time   CALCIUM 9.3 03/04/2014 1042   ALKPHOS 83 03/04/2014 1042   AST 89* 03/04/2014 1042   ALT 202* 03/04/2014 1042   BILITOT 0.25 03/04/2014 1042       No results found for this basename: LABCA2    No components found with this basename: [id number]    No results found for this basename: INR,  in the last 168 hours  Urinalysis    Component Value Date/Time   LABSPEC 1.005 01/01/2014 0947   GLUCOSEU Negative 01/01/2014 0947   UROBILINOGEN 0.2 01/01/2014 0947    STUDIES: Mr Breast Bilateral W Wo Contrast  02/19/2014   CLINICAL DATA:  53 year old female with a history of left breast cancer diagnosed at age 72 in 59 in [place]. She underwent lumpectomy, lymph node dissection, and chemotherapy at that time. She has a history of previous radiation therapy to the left breast in 2011. She moved to [place] in 2014 and recent evaluation demonstrated a mass within the left breast located at the 1 o'clock position. Ultrasound-guided core biopsy 10/04/2013 demonstrated invasive ductal carcinoma. She has undergone neoadjuvant chemotherapy. Assess response to neoadjuvant therapy.  EXAM: BILATERAL BREAST MRI WITH AND WITHOUT CONTRAST  TECHNIQUE: Multiplanar, multisequence MR images of both breasts were obtained prior to and following the intravenous administration of 73m of MultiHance  THREE-DIMENSIONAL MR IMAGE RENDERING ON INDEPENDENT WORKSTATION:  Three-dimensional MR images were rendered by post-processing of the original MR data on an independent workstation. The three-dimensional MR images were interpreted, and findings are reported in the following  complete MRI report for this study. Three dimensional images were evaluated at the independent DynaCad workstation  COMPARISON:  Previous breast MRI dated 10/12/2013.  FINDINGS: Breast composition: b.  Scattered fibroglandular tissue.  Background parenchymal enhancement: Mild  Right breast: No mass or abnormal enhancement.  Left breast: The previously seen 2.2 cm area of irregular enhancement with central clip artifact located within the anterior portion of the upper-outer quadrant of the left breast corresponding to the recently diagnosed invasive ductal carcinoma has markedly decreased in size and now only a faint area of vague enhancement remains in this region. The previously described irregular mass located 1 cm posterior to this area of enhancement has also decreased in size and now measures 9 x 7 x 4 mm in size and previously measured 1.4 x 1.2 x 0.8 cm in size. There are no new foci within the left breast.  Lymph nodes: No abnormal appearing lymph nodes.  Ancillary findings:  None.  IMPRESSION: Significant improvement in the areas of enhancement located within the upper outer quadrant of the left breast as discussed above. Otherwise, no significant change.  RECOMMENDATION: Treatment plan.  BI-RADS CATEGORY  6: Known biopsy-proven malignancy.   Electronically Signed   By: RLuberta RobertsonM.D.   On: 02/19/2014 15:20    ASSESSMENT: 53y.o. BRCA negative [place], [place]   (1) status [place], grade 2, treated with adjuvant chemotherapy (specific drugs not clear) and adjuvant radiation.  (2) status post left breast upper outer quadrant biopsy 10/04/2013 for a clinically mT2 N0, stage IIA invasive ductal carcinoma, grade 2, estrogen receptor 100% positive, progesterone receptor 33% positive, with an MIB-1 of 15%, and HER-2 amplified; staging studies showed no evidence of metastatic  disease  (3) started on neoadjuvant carboplatin/ docetaxel/ trastuzumab/ pertuzumab 11/12/2013  (a) completed 4 cycles as of 05/06/201, after which docetaxel was discontinued because of neuropathy symptoms   (b) gemcitabine to be given day 1 and day 8 with carboplatin day 1 in the final 2 cycles  (4) surgery to follow chemotherapy  (5) aromatase inhibitors to follow surgery  (6) genetic testing February 2015 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    PLAN: ATerahis tolerating the new chemotherapy quite well. The results  of the repeat breast MRI are very encouraging. Repeat echocardiogram performed on 02/25/2014 revealed an ejection fraction of 55-60%. Her hemoglobin is slightly low today at 8.0 g/dL however she is completely asymptomatic. We'll continue to monitor this closely. She'll followup one week for a symptom management visit as previously scheduled. Once she completes her sixth cycle of chemotherapy,  she will continue trastuzumab alone for one year.  She already has an appointment with Dr.Tsuei for 03/01/2014, and I expect she will proceed to surgery some time early July of this year. She will see me again late July to discuss results and we will likely start anastrozole at that time.  Theona has a good understanding of the overall plan. She agrees with it. She knows the goal of treatment in her case is cure. She will call with any problems that may develop before her next visit here.  Carlton Adam, PA-C   03/04/2014 4:59 PM

## 2014-03-05 ENCOUNTER — Ambulatory Visit (HOSPITAL_BASED_OUTPATIENT_CLINIC_OR_DEPARTMENT_OTHER): Payer: Federal, State, Local not specified - PPO

## 2014-03-05 ENCOUNTER — Ambulatory Visit: Payer: Federal, State, Local not specified - PPO

## 2014-03-05 VITALS — BP 126/65 | HR 84 | Temp 98.2°F

## 2014-03-05 DIAGNOSIS — Z5189 Encounter for other specified aftercare: Secondary | ICD-10-CM

## 2014-03-05 DIAGNOSIS — C50412 Malignant neoplasm of upper-outer quadrant of left female breast: Secondary | ICD-10-CM

## 2014-03-05 DIAGNOSIS — C50419 Malignant neoplasm of upper-outer quadrant of unspecified female breast: Secondary | ICD-10-CM

## 2014-03-05 MED ORDER — PEGFILGRASTIM INJECTION 6 MG/0.6ML
6.0000 mg | Freq: Once | SUBCUTANEOUS | Status: AC
  Administered 2014-03-05: 6 mg via SUBCUTANEOUS
  Filled 2014-03-05: qty 0.6

## 2014-03-11 ENCOUNTER — Ambulatory Visit: Payer: Federal, State, Local not specified - PPO | Admitting: Physician Assistant

## 2014-03-11 ENCOUNTER — Other Ambulatory Visit: Payer: Federal, State, Local not specified - PPO

## 2014-03-11 ENCOUNTER — Telehealth: Payer: Self-pay | Admitting: Oncology

## 2014-03-11 NOTE — Telephone Encounter (Signed)
pt called to r/s appt..done...pt aware of new d.t °

## 2014-03-12 ENCOUNTER — Telehealth: Payer: Self-pay | Admitting: Oncology

## 2014-03-12 ENCOUNTER — Ambulatory Visit: Payer: Federal, State, Local not specified - PPO

## 2014-03-12 NOTE — Telephone Encounter (Signed)
per Aj ok to put on LC sched...she confirmed with Lima

## 2014-03-14 ENCOUNTER — Other Ambulatory Visit: Payer: Federal, State, Local not specified - PPO

## 2014-03-14 ENCOUNTER — Ambulatory Visit: Payer: Federal, State, Local not specified - PPO | Admitting: Adult Health

## 2014-03-14 ENCOUNTER — Ambulatory Visit: Payer: Federal, State, Local not specified - PPO | Admitting: Physician Assistant

## 2014-03-18 ENCOUNTER — Ambulatory Visit (HOSPITAL_BASED_OUTPATIENT_CLINIC_OR_DEPARTMENT_OTHER): Payer: Federal, State, Local not specified - PPO | Admitting: Oncology

## 2014-03-18 ENCOUNTER — Encounter: Payer: Self-pay | Admitting: Oncology

## 2014-03-18 ENCOUNTER — Ambulatory Visit (HOSPITAL_BASED_OUTPATIENT_CLINIC_OR_DEPARTMENT_OTHER): Payer: Federal, State, Local not specified - PPO

## 2014-03-18 ENCOUNTER — Other Ambulatory Visit (HOSPITAL_BASED_OUTPATIENT_CLINIC_OR_DEPARTMENT_OTHER): Payer: Federal, State, Local not specified - PPO

## 2014-03-18 VITALS — BP 128/74 | HR 81 | Temp 98.1°F | Resp 18 | Ht 67.0 in | Wt 185.2 lb

## 2014-03-18 DIAGNOSIS — C50419 Malignant neoplasm of upper-outer quadrant of unspecified female breast: Secondary | ICD-10-CM

## 2014-03-18 DIAGNOSIS — Z5112 Encounter for antineoplastic immunotherapy: Secondary | ICD-10-CM

## 2014-03-18 DIAGNOSIS — C50412 Malignant neoplasm of upper-outer quadrant of left female breast: Secondary | ICD-10-CM

## 2014-03-18 LAB — CBC WITH DIFFERENTIAL/PLATELET
BASO%: 0.3 % (ref 0.0–2.0)
Basophils Absolute: 0 10*3/uL (ref 0.0–0.1)
EOS%: 1.4 % (ref 0.0–7.0)
Eosinophils Absolute: 0.2 10*3/uL (ref 0.0–0.5)
HEMATOCRIT: 29.5 % — AB (ref 34.8–46.6)
HGB: 9.2 g/dL — ABNORMAL LOW (ref 11.6–15.9)
LYMPH#: 1.4 10*3/uL (ref 0.9–3.3)
LYMPH%: 11.9 % — ABNORMAL LOW (ref 14.0–49.7)
MCH: 33.3 pg (ref 25.1–34.0)
MCHC: 31.2 g/dL — AB (ref 31.5–36.0)
MCV: 106.9 fL — ABNORMAL HIGH (ref 79.5–101.0)
MONO#: 1 10*3/uL — AB (ref 0.1–0.9)
MONO%: 8.9 % (ref 0.0–14.0)
NEUT#: 8.9 10*3/uL — ABNORMAL HIGH (ref 1.5–6.5)
NEUT%: 77.5 % — AB (ref 38.4–76.8)
Platelets: 424 10*3/uL — ABNORMAL HIGH (ref 145–400)
RBC: 2.76 10*6/uL — ABNORMAL LOW (ref 3.70–5.45)
RDW: 20.5 % — AB (ref 11.2–14.5)
WBC: 11.5 10*3/uL — AB (ref 3.9–10.3)

## 2014-03-18 LAB — COMPREHENSIVE METABOLIC PANEL (CC13)
ALBUMIN: 3.8 g/dL (ref 3.5–5.0)
ALT: 113 U/L — ABNORMAL HIGH (ref 0–55)
AST: 60 U/L — ABNORMAL HIGH (ref 5–34)
Alkaline Phosphatase: 122 U/L (ref 40–150)
Anion Gap: 8 mEq/L (ref 3–11)
BILIRUBIN TOTAL: 0.27 mg/dL (ref 0.20–1.20)
BUN: 6.4 mg/dL — AB (ref 7.0–26.0)
CO2: 26 mEq/L (ref 22–29)
Calcium: 9.6 mg/dL (ref 8.4–10.4)
Chloride: 108 mEq/L (ref 98–109)
Creatinine: 0.7 mg/dL (ref 0.6–1.1)
GLUCOSE: 124 mg/dL (ref 70–140)
POTASSIUM: 4.4 meq/L (ref 3.5–5.1)
SODIUM: 143 meq/L (ref 136–145)
Total Protein: 6.5 g/dL (ref 6.4–8.3)

## 2014-03-18 MED ORDER — SODIUM CHLORIDE 0.9 % IJ SOLN
10.0000 mL | INTRAMUSCULAR | Status: DC | PRN
  Administered 2014-03-18: 10 mL
  Filled 2014-03-18: qty 10

## 2014-03-18 MED ORDER — ACETAMINOPHEN 325 MG PO TABS
650.0000 mg | ORAL_TABLET | Freq: Once | ORAL | Status: AC
  Administered 2014-03-18: 650 mg via ORAL

## 2014-03-18 MED ORDER — DIPHENHYDRAMINE HCL 25 MG PO CAPS
50.0000 mg | ORAL_CAPSULE | Freq: Once | ORAL | Status: AC
  Administered 2014-03-18: 50 mg via ORAL

## 2014-03-18 MED ORDER — TRASTUZUMAB CHEMO INJECTION 440 MG
6.0000 mg/kg | Freq: Once | INTRAVENOUS | Status: AC
  Administered 2014-03-18: 483 mg via INTRAVENOUS
  Filled 2014-03-18: qty 23

## 2014-03-18 MED ORDER — ACETAMINOPHEN 325 MG PO TABS
ORAL_TABLET | ORAL | Status: AC
  Filled 2014-03-18: qty 2

## 2014-03-18 MED ORDER — HEPARIN SOD (PORK) LOCK FLUSH 100 UNIT/ML IV SOLN
500.0000 [IU] | Freq: Once | INTRAVENOUS | Status: AC | PRN
  Administered 2014-03-18: 500 [IU]
  Filled 2014-03-18: qty 5

## 2014-03-18 MED ORDER — DIPHENHYDRAMINE HCL 25 MG PO CAPS
ORAL_CAPSULE | ORAL | Status: AC
  Filled 2014-03-18: qty 2

## 2014-03-18 MED ORDER — SODIUM CHLORIDE 0.9 % IV SOLN
Freq: Once | INTRAVENOUS | Status: AC
  Administered 2014-03-18: 16:00:00 via INTRAVENOUS

## 2014-03-18 MED ORDER — LIDOCAINE-PRILOCAINE 2.5-2.5 % EX CREA
TOPICAL_CREAM | CUTANEOUS | Status: AC
  Filled 2014-03-18: qty 5

## 2014-03-18 NOTE — Progress Notes (Signed)
[facility]  [phone] [phone]     ID: Kelsey Mclaughlin OB: August 16, 1961  MR#: [id number]  YTK#:[id number]  PCP: Default, Provider, MD GYN:   SU:  OTHER MD:  CHIEF COMPLAINT: Breast cancer, recurrent CURRENT TREATMENT: chemotherapy  BREAST CANCER HISTORY: From doctor [name]'s intake note 55/73/2202:  "Kelsey Mclaughlin is a 53 y.o. female. In 1998 at the age of 69 was diagnosed with left breast cancer in [place]. This was an invasive carcinoma grade 2 stage II. At that time she underwent a lumpectomy with excellent lymph node dissection. She received 6 months of chemotherapy. [name] She also had radiation therapy to the left breast. 2011 patient noticed a lump in the outside of her left breast. She had ultrasound workup performed and was told it was negative no biopsies were performed. General 2014 after [place] moved to the [place] she had a mammogram performed this revealed a possible mass in the outer quadrant of the left breast. Ultrasound showed it to be 1.7 cm. MRI of the breasts performed on January 30 revealed the mass to be 1.5 x 2.2 x 1.5 cm. Because of this she also had a biopsy performed on 10/04/2013. The biopsy revealed [[id number]) an invasive ductal carcinoma with ductal carcinoma in situ grade 2. Prognostic panel was positive for estrogen receptor 100% megestrol receptor +33% proliferation marker Ki-67 15% and the tumor was HER-2/neu positive" [with a signals ratio of 3.1 and number per cell 6.3]  The patient's subsequent history is as detailed below  INTERVAL HISTORY: Kelsey Mclaughlin returns today for followup of her breast cancer accompanied by her spouse Kelsey Mclaughlin. The patient completed her chemotherapy and is here today for Herceptin every 3 weeks. Echocardiogram performed on 02/25/2014 showed a left ventricular ejection fraction of 55-60%.    REVIEW OF SYSTEMS:  [place] reports she continues to have mild fatigue. Reports that she has dyspnea with exertion. She still has some neuropathy, but it is "getting better." She voiced no specific complaints today. She has had no significant problems with nausea, vomiting, fatigue, rash, mouth sores, or change in bowel or bladder habits. A detailed review of systems today was otherwise entirely stable  PAST MEDICAL HISTORY: Past Medical History  Diagnosis Date  . Cancer 1998, 2015    breast  . Hypertension   . Bronchitis     completed antibiotics 10/21/13/ states resolved  . Stroke 2004    TIA-  no problems since    PAST SURGICAL HISTORY: Past Surgical History  Procedure Laterality Date  . Breast surgery  1998    lumpectomy on left and removed lymphnodes in [place]  . Portacath placement Right 10/25/2013    Procedure: INSERTION PORT-A-CATH;  Surgeon: Imogene Burn. Georgette Dover, MD;  Location: [facility];  Service: General;  Laterality: Right;    FAMILY HISTORY Family History  Problem Relation Age of Onset  . Breast cancer Mother 47  . Heart disease Father   . Heart attack Father   . Breast cancer Maternal Grandmother 93  . Brain cancer Maternal Uncle 74    benign brain tumor  . Heart attack Paternal Aunt     GYNECOLOGIC HISTORY:   menarche age 3, first live birth age 37. The patient is GX P2. She went to the change of life approximately 2011.   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 They are in their early 44s. There are no grandchildren. The patient married Kelsey Mclaughlin (both women are originally from [place]) in [place]. [place] 5 talks and 10 chickens. They're not church attender's    ADVANCED DIRECTIVES:    HEALTH MAINTENANCE: History  Substance Use Topics  . Smoking status: Never Smoker   . Smokeless tobacco: Never Used  .  Alcohol Use: Yes     Comment: 2 drinks/day     Colonoscopy:  PAP:  Bone density:  Lipid panel:  No Known Allergies  Current Outpatient Prescriptions  Medication Sig Dispense Refill  . lidocaine-prilocaine (EMLA) cream Apply to port-a-cath 1 - 2 hours prior to being accessed.  30 g  3   No current facility-administered medications for this visit.   Facility-Administered Medications Ordered in Other Visits  Medication Dose Route Frequency Provider Last Rate Last Dose  . sodium chloride 0.9 % injection 10 mL  10 mL Intracatheter PRN [name], MD      . trastuzumab (HERCEPTIN) 483 mg in sodium chloride 0.9 % 250 mL chemo infusion  6 mg/kg (Order-Specific) Intravenous Once [name], MD 546 mL/hr at 03/18/14 1556 483 mg at 03/18/14 1556    OBJECTIVE: middle-aged white woman who appears stated age 82 Vitals:   03/18/14 1408  BP: 128/74  Pulse: 81  Temp: 98.1 F (36.7 C)  Resp: 18     Body mass index is 29 kg/(m^2).    ECOG FS:1 - Symptomatic but completely ambulatory  Ocular: Sclerae unicteric, EOMs intact Ear-nose-throat: Oropharynx clear and moist Lymphatic: No cervical or supraclavicular adenopathy Lungs no rales or rhonchi Heart regular rate and rhythm Abd soft, nontender, positive bowel sounds MSK no focal spinal tenderness, no joint edema Neuro: non-focal, well-oriented, appropriate affect Breasts: Deferred  LAB RESULTS:  CMP     Component Value Date/Time   NA 143 03/18/2014 1358   K 4.4 03/18/2014 1358   CO2 26 03/18/2014 1358   GLUCOSE 124 03/18/2014 1358   BUN 6.4* 03/18/2014 1358   CREATININE 0.7 03/18/2014 1358   CALCIUM 9.6 03/18/2014 1358   PROT 6.5 03/18/2014 1358   ALBUMIN 3.8 03/18/2014 1358   AST 60* 03/18/2014 1358   ALT 113* 03/18/2014 1358   ALKPHOS 122 03/18/2014 1358   BILITOT 0.27 03/18/2014 1358    I No results found for this basename: SPEP,  UPEP,   kappa and lambda light chains    Lab Results  Component Value Date   WBC 11.5* 03/18/2014    NEUTROABS 8.9* 03/18/2014   HGB 9.2* 03/18/2014   HCT 29.5* 03/18/2014   MCV 106.9* 03/18/2014   PLT 424* 03/18/2014      Chemistry      Component Value Date/Time   NA 143 03/18/2014 1358   K 4.4 03/18/2014 1358   CO2 26 03/18/2014 1358   BUN 6.4* 03/18/2014 1358   CREATININE 0.7 03/18/2014 1358      Component Value Date/Time   CALCIUM 9.6 03/18/2014 1358   ALKPHOS 122 03/18/2014 1358   AST 60* 03/18/2014 1358   ALT 113* 03/18/2014 1358   BILITOT 0.27 03/18/2014 1358       No results found for this basename: LABCA2    No components found with this basename: [id number]    No results found for this basename: INR,  in the last 168 hours  Urinalysis  Component Value Date/Time   LABSPEC 1.005 01/01/2014 0947   GLUCOSEU Negative 01/01/2014 0947   UROBILINOGEN 0.2 01/01/2014 0947    STUDIES: Mr Breast Bilateral W Wo Contrast  02/19/2014   CLINICAL DATA:  53 year old female with a history of left breast cancer diagnosed at age 48 in 82 in [place]. She underwent lumpectomy, lymph node dissection, and chemotherapy at that time. She has a history of previous radiation therapy to the left breast in 2011. She moved to [place] in 2014 and recent evaluation demonstrated a mass within the left breast located at the 1 o'clock position. Ultrasound-guided core biopsy 10/04/2013 demonstrated invasive ductal carcinoma. She has undergone neoadjuvant chemotherapy. Assess response to neoadjuvant therapy.  EXAM: BILATERAL BREAST MRI WITH AND WITHOUT CONTRAST  TECHNIQUE: Multiplanar, multisequence MR images of both breasts were obtained prior to and following the intravenous administration of 36m of MultiHance  THREE-DIMENSIONAL MR IMAGE RENDERING ON INDEPENDENT WORKSTATION:  Three-dimensional MR images were rendered by post-processing of the original MR data on an independent workstation. The three-dimensional MR images were interpreted, and findings are reported in the following complete MRI report for this study.  Three dimensional images were evaluated at the independent DynaCad workstation  COMPARISON:  Previous breast MRI dated 10/12/2013.  FINDINGS: Breast composition: b.  Scattered fibroglandular tissue.  Background parenchymal enhancement: Mild  Right breast: No mass or abnormal enhancement.  Left breast: The previously seen 2.2 cm area of irregular enhancement with central clip artifact located within the anterior portion of the upper-outer quadrant of the left breast corresponding to the recently diagnosed invasive ductal carcinoma has markedly decreased in size and now only a faint area of vague enhancement remains in this region. The previously described irregular mass located 1 cm posterior to this area of enhancement has also decreased in size and now measures 9 x 7 x 4 mm in size and previously measured 1.4 x 1.2 x 0.8 cm in size. There are no new foci within the left breast.  Lymph nodes: No abnormal appearing lymph nodes.  Ancillary findings:  None.  IMPRESSION: Significant improvement in the areas of enhancement located within the upper outer quadrant of the left breast as discussed above. Otherwise, no significant change.  RECOMMENDATION: Treatment plan.  BI-RADS CATEGORY  6: Known biopsy-proven malignancy.   Electronically Signed   By: RLuberta RobertsonM.D.   On: 02/19/2014 15:20    ASSESSMENT: 53y.o. BRCA negative [place] woman, [place]   (1) status [place], grade 2, treated with adjuvant chemotherapy (specific drugs not clear) and adjuvant radiation.  (2) status post left breast upper outer quadrant biopsy 10/04/2013 for a clinically mT2 N0, stage IIA invasive ductal carcinoma, grade 2, estrogen receptor 100% positive, progesterone receptor 33% positive, with an MIB-1 of 15%, and HER-2 amplified; staging studies showed no evidence of metastatic disease  (3) started on  neoadjuvant carboplatin/ docetaxel/ trastuzumab/ pertuzumab 11/12/2013  (a) completed 4 cycles as of 05/06/201, after which docetaxel was discontinued because of neuropathy symptoms   (b) gemcitabine to be given day 1 and day 8 with carboplatin day 1 in the final 2 cycles  (4) surgery to follow chemotherapy  (5) aromatase inhibitors to follow surgery  (6) genetic testing February 2015 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    PLAN: AKwanais doing well. Side effects from her chemotherapy her slowly resolving. The patient  is scheduled to have surgery with immediate reconstruction at the end of this month.  The patient is due for trastuzumab today and this will be continued every 3 weeks for a total of one year. Her ejection fraction remains normal at 55-60%. She is also being followed by cardiology.  Her hemoglobin has improved to 9.2 today. She has mild fatigue and dyspnea on exertion, but I anticipate this will continue to improve since she has completed her chemotherapy.  The patient is scheduled back with Dr. Jana Hakim at the end of this month for her next dose of trastuzumab and to discuss beginning anastrozole.  Aribelle has a good understanding of the overall plan. She agrees with it. She knows the goal of treatment in her case is cure. She will call with any problems that may develop before her next visit here.  Mikey Bussing, NP   03/18/2014 4:17 PM

## 2014-03-18 NOTE — Patient Instructions (Signed)
You may use Ibuprofen 400 mg every 6 hours as needed for aches and pains. Take with food.

## 2014-03-18 NOTE — Patient Instructions (Signed)
[facility] Discharge Instructions for Patients Receiving Chemotherapy  Today you received the following chemotherapy agent Herceptin.   If you develop nausea and vomiting that is not controlled by your nausea medication, call the clinic.   BELOW ARE SYMPTOMS THAT SHOULD BE REPORTED IMMEDIATELY:  *FEVER GREATER THAN 100.5 F  *CHILLS WITH OR WITHOUT FEVER  NAUSEA AND VOMITING THAT IS NOT CONTROLLED WITH YOUR NAUSEA MEDICATION  *UNUSUAL SHORTNESS OF BREATH  *UNUSUAL BRUISING OR BLEEDING  TENDERNESS IN MOUTH AND THROAT WITH OR WITHOUT PRESENCE OF ULCERS  *URINARY PROBLEMS  *BOWEL PROBLEMS  UNUSUAL RASH Items with * indicate a potential emergency and should be followed up as soon as possible.  Feel free to call the clinic you have any questions or concerns. The clinic phone number is [phone].

## 2014-03-28 ENCOUNTER — Ambulatory Visit (HOSPITAL_COMMUNITY)
Admission: RE | Admit: 2014-03-28 | Discharge: 2014-03-28 | Disposition: A | Discharge status: Home / Self Care | Payer: Federal, State, Local not specified - PPO | Source: Ambulatory Visit | Attending: Cardiology | Admitting: Cardiology

## 2014-03-28 ENCOUNTER — Encounter (HOSPITAL_COMMUNITY): Payer: Self-pay

## 2014-03-28 VITALS — BP 122/76 | HR 71 | Wt 182.0 lb

## 2014-03-28 DIAGNOSIS — Z923 Personal history of irradiation: Secondary | ICD-10-CM | POA: Insufficient documentation

## 2014-03-28 DIAGNOSIS — Z853 Personal history of malignant neoplasm of breast: Secondary | ICD-10-CM | POA: Insufficient documentation

## 2014-03-28 DIAGNOSIS — C50412 Malignant neoplasm of upper-outer quadrant of left female breast: Secondary | ICD-10-CM

## 2014-03-28 DIAGNOSIS — Z79899 Other long term (current) drug therapy: Secondary | ICD-10-CM | POA: Insufficient documentation

## 2014-03-28 DIAGNOSIS — Z8249 Family history of ischemic heart disease and other diseases of the circulatory system: Secondary | ICD-10-CM | POA: Insufficient documentation

## 2014-03-28 DIAGNOSIS — C50419 Malignant neoplasm of upper-outer quadrant of unspecified female breast: Secondary | ICD-10-CM

## 2014-03-28 DIAGNOSIS — C50919 Malignant neoplasm of unspecified site of unspecified female breast: Secondary | ICD-10-CM | POA: Insufficient documentation

## 2014-03-28 DIAGNOSIS — I10 Essential (primary) hypertension: Secondary | ICD-10-CM | POA: Insufficient documentation

## 2014-03-28 DIAGNOSIS — Z9221 Personal history of antineoplastic chemotherapy: Secondary | ICD-10-CM | POA: Insufficient documentation

## 2014-03-28 DIAGNOSIS — Z09 Encounter for follow-up examination after completed treatment for conditions other than malignant neoplasm: Secondary | ICD-10-CM | POA: Insufficient documentation

## 2014-03-28 NOTE — Patient Instructions (Signed)
Follow up in 3 months with an ECHO 

## 2014-03-28 NOTE — Progress Notes (Signed)
Patient ID: Kelsey Mclaughlin, female   DOB: 05-06-1961, 53 y.o.   MRN: [id number] Oncologist: Dr. Jana Hakim General Surgeon: Dr Georgette Dover  Plastic Surgeon   Primary Language German   53 yo with recent diagnosis of recurrent breast cancer presents for cardiology evaluation in setting of Herceptin use.  Left breast cancer was diagnosed in 1998 and treated with lumpectomy, chemo, radiation (in [place]).  1/15 left breast biopsy showed recurrent cancer, ER+/PR+/HER-2/neu+.    She has completed Taxotere/carboplatin/Herceptin/Perjeta (started in 3/15). She continues on herceptin every 3 weeks.   She returns for follow up. Doing well. Has more energy. Not exercising. s stable in general.  No exertional dyspnea or chest pain.  Tolerating chemotherapy so far.  She is no longer taking BP meds as her BP has been running low with chemotherapy. Plan for breast reconstruction July 31st.   Labs 03/18/14 : K 4.4 Creatinine 0. 7 Labs (3/15): K 4.2, creatinine 0.7  PMH: 1. HTN 2. ?TIA in 2004 3. Breast cancer: Left breast CA diagnosed in 1998, treated with lumpectomy, chemo, radiation (in [place]).  1/15 left breast biopsy showed recurrent cancer, ER+/PR+/HER-2/neu+.  Getting neoadjuvant chemo with Taxotere/carboplatin/Herceptin/Perjeta (started 3/15).  - Echo (2/15) with EF 86%, grade I diastolic dysfunction, lateral s' 10 cm/sec, global longitudinal strain -20.2%.  - Echo (6/15) with EF 55-60%, lateral s' 9.6 cm/sec, GLS -20.8%.  4. Left upper extremity lymphedema.   SH: Originally from [place], does not speak [place].  Has significant other.  Not working.   FH: Father with MI at 35.   ROS: All systems reviewed and negative except as per HPI.   Current Outpatient Prescriptions  Medication Sig Dispense Refill  . lidocaine-prilocaine (EMLA) cream Apply to port-a-cath 1 - 2 hours prior to being accessed.  30 g  3   No current facility-administered medications for this encounter.    BP 122/76  Pulse  71  Wt 182 lb (82.555 kg)  SpO2 96% General: NAD Neck: No JVD, no thyromegaly or thyroid nodule.  Lungs: Clear to auscultation bilaterally with normal respiratory effort. CV: Nondisplaced PMI.  Heart regular S1/S2, no S3/S4, no murmur.  No peripheral edema.  No carotid bruit.  Normal pedal pulses.  Abdomen: Soft, nontender, no hepatosplenomegaly, no distention.  Skin: Intact without lesions or rashes.  Neurologic: Alert and oriented x 3.  Psych: Normal affect. Extremities: No clubbing or cyanosis.  HEENT: Normal.   Assessment/Plan: 1. 53 yo with recent diagnosis of recurrent breast cancer is now getting neoadjuvant chemotherapy involving Herceptin.   Doppler parameters, EF, and strain stable on echo, reviewed today.   Follow up every 3 months with an ECHO.   [name],[name] NP-C  03/28/2014  Patient seen with NP, agree with the above note.  She is stable symptomatically.  I reviewed recent echo, parameters are all stable.  She will continue Herceptin and will followup with echo in 3 months.   Kelsey Mclaughlin 03/29/2014

## 2014-04-04 ENCOUNTER — Encounter (HOSPITAL_COMMUNITY)
Admission: RE | Admit: 2014-04-04 | Discharge: 2014-04-04 | Disposition: A | Discharge status: Home / Self Care | Payer: Federal, State, Local not specified - PPO | Source: Ambulatory Visit | Attending: Surgery | Admitting: Surgery

## 2014-04-04 ENCOUNTER — Encounter (HOSPITAL_COMMUNITY): Payer: Self-pay

## 2014-04-04 DIAGNOSIS — Z01818 Encounter for other preprocedural examination: Secondary | ICD-10-CM | POA: Insufficient documentation

## 2014-04-04 DIAGNOSIS — Z01812 Encounter for preprocedural laboratory examination: Secondary | ICD-10-CM | POA: Insufficient documentation

## 2014-04-04 HISTORY — DX: Lymphedema, not elsewhere classified: I89.0

## 2014-04-04 LAB — BASIC METABOLIC PANEL
ANION GAP: 13 (ref 5–15)
BUN: 11 mg/dL (ref 6–23)
CALCIUM: 9.5 mg/dL (ref 8.4–10.5)
CHLORIDE: 106 meq/L (ref 96–112)
CO2: 24 meq/L (ref 19–32)
CREATININE: 0.58 mg/dL (ref 0.50–1.10)
GFR calc Af Amer: >90 mL/min (ref >90)
GFR calc non Af Amer: >90 mL/min (ref >90)
GLUCOSE: 99 mg/dL (ref 70–99)
Potassium: 3.9 mEq/L (ref 3.7–5.3)
Sodium: 143 mEq/L (ref 137–147)

## 2014-04-04 LAB — CBC
HCT: 35.8 % — ABNORMAL LOW (ref 36.0–46.0)
Hemoglobin: 11.3 g/dL — ABNORMAL LOW (ref 12.0–15.0)
MCH: 32 pg (ref 26.0–34.0)
MCHC: 31.6 g/dL (ref 30.0–36.0)
MCV: 101.4 fL — AB (ref 78.0–100.0)
Platelets: 285 10*3/uL (ref 150–400)
RBC: 3.53 MIL/uL — AB (ref 3.87–5.11)
RDW: 14.6 % (ref 11.5–15.5)
WBC: 8.1 10*3/uL (ref 4.0–10.5)

## 2014-04-04 NOTE — Pre-Procedure Instructions (Signed)
Kelsey Mclaughlin  04/04/2014   Your procedure is scheduled on:  04/12/14  Report to [facility] Admitting at 530 AM.  Call this number if you have problems the morning of surgery: [phone]   Remember:   Do not eat food or drink liquids after midnight.   Take these medicines the morning of surgery with A SIP OF WATER: none   Do not wear jewelry, make-up or nail polish.  Do not wear lotions, powders, or perfumes. You may wear deodorant.  Do not shave 48 hours prior to surgery. Men may shave face and neck.  Do not bring valuables to the hospital.  [facility] is not responsible                  for any belongings or valuables.               Contacts, dentures or bridgework may not be worn into surgery.  Leave suitcase in the car. After surgery it may be brought to your room.  For patients admitted to the hospital, discharge time is determined by your                treatment team.               Patients discharged the day of surgery will not be allowed to drive  home.  Name and phone number of your driver: family  Special Instructions: Shower using CHG 2 nights before surgery and the night before surgery.  If you shower the day of surgery use CHG.  Use special wash - you have one bottle of CHG for all showers.  You should use approximately 1/3 of the bottle for each shower.   Please read over the following fact sheets that you were given: Pain Booklet, Coughing and Deep Breathing and Surgical Site Infection Prevention

## 2014-04-08 ENCOUNTER — Ambulatory Visit: Payer: Federal, State, Local not specified - PPO

## 2014-04-09 ENCOUNTER — Other Ambulatory Visit: Payer: Self-pay | Admitting: *Deleted

## 2014-04-09 DIAGNOSIS — C50412 Malignant neoplasm of upper-outer quadrant of left female breast: Secondary | ICD-10-CM

## 2014-04-10 ENCOUNTER — Ambulatory Visit (HOSPITAL_BASED_OUTPATIENT_CLINIC_OR_DEPARTMENT_OTHER): Payer: Federal, State, Local not specified - PPO

## 2014-04-10 ENCOUNTER — Ambulatory Visit (HOSPITAL_BASED_OUTPATIENT_CLINIC_OR_DEPARTMENT_OTHER): Payer: Federal, State, Local not specified - PPO | Admitting: Oncology

## 2014-04-10 ENCOUNTER — Other Ambulatory Visit (HOSPITAL_BASED_OUTPATIENT_CLINIC_OR_DEPARTMENT_OTHER): Payer: Federal, State, Local not specified - PPO

## 2014-04-10 VITALS — BP 129/77 | HR 51 | Temp 97.8°F | Resp 18 | Ht 67.0 in | Wt 183.0 lb

## 2014-04-10 DIAGNOSIS — C50412 Malignant neoplasm of upper-outer quadrant of left female breast: Secondary | ICD-10-CM

## 2014-04-10 DIAGNOSIS — C50419 Malignant neoplasm of upper-outer quadrant of unspecified female breast: Secondary | ICD-10-CM

## 2014-04-10 DIAGNOSIS — Z170 Estrogen receptor positive status [ER+]: Secondary | ICD-10-CM

## 2014-04-10 DIAGNOSIS — Z5112 Encounter for antineoplastic immunotherapy: Secondary | ICD-10-CM

## 2014-04-10 LAB — CBC WITH DIFFERENTIAL/PLATELET
BASO%: 0.8 % (ref 0.0–2.0)
BASOS ABS: 0.1 10*3/uL (ref 0.0–0.1)
EOS%: 11.1 % — AB (ref 0.0–7.0)
Eosinophils Absolute: 0.7 10*3/uL — ABNORMAL HIGH (ref 0.0–0.5)
HEMATOCRIT: 35.7 % (ref 34.8–46.6)
HEMOGLOBIN: 11.6 g/dL (ref 11.6–15.9)
LYMPH#: 1.2 10*3/uL (ref 0.9–3.3)
LYMPH%: 17.7 % (ref 14.0–49.7)
MCH: 32.2 pg (ref 25.1–34.0)
MCHC: 32.5 g/dL (ref 31.5–36.0)
MCV: 99.2 fL (ref 79.5–101.0)
MONO#: 0.7 10*3/uL (ref 0.1–0.9)
MONO%: 10.6 % (ref 0.0–14.0)
NEUT#: 3.9 10*3/uL (ref 1.5–6.5)
NEUT%: 59.8 % (ref 38.4–76.8)
PLATELETS: 185 10*3/uL (ref 145–400)
RBC: 3.6 10*6/uL — ABNORMAL LOW (ref 3.70–5.45)
RDW: 13.7 % (ref 11.2–14.5)
WBC: 6.5 10*3/uL (ref 3.9–10.3)
nRBC: 0 % (ref 0–0)

## 2014-04-10 LAB — COMPREHENSIVE METABOLIC PANEL (CC13)
ALT: 64 U/L — ABNORMAL HIGH (ref 0–55)
AST: 50 U/L — ABNORMAL HIGH (ref 5–34)
Albumin: 3.5 g/dL (ref 3.5–5.0)
Alkaline Phosphatase: 75 U/L (ref 40–150)
Anion Gap: 7 mEq/L (ref 3–11)
BUN: 7.8 mg/dL (ref 7.0–26.0)
CHLORIDE: 109 meq/L (ref 98–109)
CO2: 26 mEq/L (ref 22–29)
CREATININE: 0.7 mg/dL (ref 0.6–1.1)
Calcium: 9.3 mg/dL (ref 8.4–10.4)
GLUCOSE: 121 mg/dL (ref 70–140)
Potassium: 3.7 mEq/L (ref 3.5–5.1)
Sodium: 143 mEq/L (ref 136–145)
Total Bilirubin: 0.37 mg/dL (ref 0.20–1.20)
Total Protein: 6.2 g/dL — ABNORMAL LOW (ref 6.4–8.3)

## 2014-04-10 MED ORDER — SODIUM CHLORIDE 0.9 % IV SOLN
6.0000 mg/kg | Freq: Once | INTRAVENOUS | Status: AC
  Administered 2014-04-10: 504 mg via INTRAVENOUS
  Filled 2014-04-10: qty 24

## 2014-04-10 MED ORDER — ACETAMINOPHEN 325 MG PO TABS
650.0000 mg | ORAL_TABLET | Freq: Once | ORAL | Status: AC
  Administered 2014-04-10: 650 mg via ORAL

## 2014-04-10 MED ORDER — DIPHENHYDRAMINE HCL 25 MG PO CAPS
ORAL_CAPSULE | ORAL | Status: AC
  Filled 2014-04-10: qty 2

## 2014-04-10 MED ORDER — ACETAMINOPHEN 325 MG PO TABS
ORAL_TABLET | ORAL | Status: AC
  Filled 2014-04-10: qty 2

## 2014-04-10 MED ORDER — SODIUM CHLORIDE 0.9 % IJ SOLN
10.0000 mL | INTRAMUSCULAR | Status: DC | PRN
  Administered 2014-04-10: 10 mL
  Filled 2014-04-10: qty 10

## 2014-04-10 MED ORDER — HEPARIN SOD (PORK) LOCK FLUSH 100 UNIT/ML IV SOLN
500.0000 [IU] | Freq: Once | INTRAVENOUS | Status: AC | PRN
  Administered 2014-04-10: 500 [IU]
  Filled 2014-04-10: qty 5

## 2014-04-10 MED ORDER — DIPHENHYDRAMINE HCL 25 MG PO CAPS
25.0000 mg | ORAL_CAPSULE | Freq: Once | ORAL | Status: AC
  Administered 2014-04-10: 25 mg via ORAL

## 2014-04-10 MED ORDER — SODIUM CHLORIDE 0.9 % IV SOLN
Freq: Once | INTRAVENOUS | Status: AC
  Administered 2014-04-10: 10:00:00 via INTRAVENOUS

## 2014-04-10 NOTE — Patient Instructions (Signed)

## 2014-04-10 NOTE — Progress Notes (Signed)
[facility]  [phone] [phone]     ID: [name] OB: 09/14/1960  MR#: [id number]  HWE#:[id number]  PCP: Default, Provider, MD GYN:   SU: Donnie Mesa MD OTHER MD: Irene Limbo MD, Gwendolyn Grant MD  CHIEF COMPLAINT: Breast cancer, recurrent CURRENT TREATMENT: anti HER-2 immunotherapy  BREAST CANCER HISTORY: From doctor [name]'s intake note 89/38/1017:  "Kelsey Mclaughlin is a 53 y.o. female. In 1998 at the age of 53 was diagnosed with left breast cancer in [place]. This was an invasive carcinoma grade 2 stage II. At that time she underwent a lumpectomy with excellent lymph node dissection. She received 6 months of chemotherapy. [name] She also had radiation therapy to the left breast. 2011 patient noticed a lump in the outside of her left breast. She had ultrasound workup performed and was told it was negative no biopsies were performed. General 2014 after patient moved to the [place] she had a mammogram performed this revealed a possible mass in the outer quadrant of the left breast. Ultrasound showed it to be 1.7 cm. MRI of the breasts performed on January 30 revealed the mass to be 1.5 x 2.2 x 1.5 cm. Because of this she also had a biopsy performed on 10/04/2013. The biopsy revealed [[id number]) an invasive ductal carcinoma with ductal carcinoma in situ grade 2. Prognostic panel was positive for estrogen receptor 100% megestrol receptor +33% proliferation marker Ki-67 15% and the tumor was HER-2/neu positive" [with a signals ratio of 3.1 and number per cell 6.3]  The patient's subsequent history is as detailed below  INTERVAL HISTORY: Kelsey Mclaughlin returns today for followup of her breast cancer accompanied by her spouse Kelsey Mclaughlin. Kelsey Mclaughlin continues on every 3 week trastuzumab, which she is tolerating well. She is scheduled for definitive surgery, which will be right  mastectomy with immediate reconstruction 04/12/2014. The other interval development is that [place] and Kelsey Mclaughlin have bought 2 horses. They're very excited about this.    REVIEW OF SYSTEMS:  Kelsey Mclaughlin hair is beginning to grow back. She has some joint stiffness in both hips particularly in the morning. As the day goes on that feels better and she reports no symptoms suggestive of congestive heart failure. She has a very good understanding of the upcoming surgery plan. A detailed review of systems was otherwise entirely stable  PAST MEDICAL HISTORY: Past Medical History  Diagnosis Date  . Cancer 1998, 2015    breast  . Bronchitis     completed antibiotics 10/21/13/ states resolved  . Stroke 2004    TIA-  no problems since  . Hypertension     no meds  . Lymph edema     lt arm    PAST SURGICAL HISTORY: Past Surgical History  Procedure Laterality Date  . Breast surgery  1998    lumpectomy on left and removed lymphnodes in [place]  . Portacath placement Right 10/25/2013    Procedure: INSERTION PORT-A-CATH;  Surgeon: Imogene Burn. Georgette Dover, MD;  Location: [facility];  Service: General;  Laterality: Right;    FAMILY HISTORY Family History  Problem Relation Age of Onset  . Breast cancer Mother 82  . Heart disease Father   . Heart attack Father   . Breast cancer Maternal Grandmother 87  . Brain cancer Maternal Uncle 74    benign brain tumor  . Heart attack Paternal Aunt     GYNECOLOGIC HISTORY:   menarche  age 19, first live birth age 25. The patient is GX P2. She went to the change of life approximately 2011.   SOCIAL HISTORY:  The patient worked previously as a Marine scientist. She has 2 children both of whom live in [place], works for the [employer], and Marcello Moores, is a Librarian, academic. They are in their early 57s. There are no grandchildren. The patient married Kelsey Mclaughlin (both women are originally from [place]) in [place]. [place] 5 talks and 10 chickens.  They're not church attender's    ADVANCED DIRECTIVES:    HEALTH MAINTENANCE: History  Substance Use Topics  . Smoking status: Never Smoker   . Smokeless tobacco: Never Used  . Alcohol Use: Yes     Comment: 2 drinks/day     Colonoscopy:  PAP:  Bone density:  Lipid panel:  No Known Allergies  Current Outpatient Prescriptions  Medication Sig Dispense Refill  . lidocaine-prilocaine (EMLA) cream Apply 1 application topically as needed. Apply to port-a-cath 1-2 hours prior to being accessed       No current facility-administered medications for this visit.    OBJECTIVE: middle-aged white woman in no acute distress  Filed Vitals:   04/10/14 0922  BP: 129/77  Pulse: 51  Temp: 97.8 F (36.6 C)  Resp: 18     Body mass index is 28.66 kg/(m^2).    ECOG FS:1 - Symptomatic but completely ambulatory  Ocular: Sclerae unicteric,  pupils round and equal  Ear-nose-throat: Oropharynx clear and moist Lymphatic: No cervical or supraclavicular adenopathy Lungs no rales or rhonchi Heart regular rate and rhythm Abd soft, nontender, positive bowel sounds MSK no focal spinal tenderness,  grade 1 left upper extremity lymphedema  Neuro: non-focal, well-oriented,  positive  affect Breasts: Deferred  LAB RESULTS:  CMP     Component Value Date/Time   NA 143 04/04/2014 1520   NA 143 03/18/2014 1358   K 3.9 04/04/2014 1520   K 4.4 03/18/2014 1358   CL 106 04/04/2014 1520   CO2 24 04/04/2014 1520   CO2 26 03/18/2014 1358   GLUCOSE 99 04/04/2014 1520   GLUCOSE 124 03/18/2014 1358   BUN 11 04/04/2014 1520   BUN 6.4* 03/18/2014 1358   CREATININE 0.58 04/04/2014 1520   CREATININE 0.7 03/18/2014 1358   CALCIUM 9.5 04/04/2014 1520   CALCIUM 9.6 03/18/2014 1358   PROT 6.5 03/18/2014 1358   ALBUMIN 3.8 03/18/2014 1358   AST 60* 03/18/2014 1358   ALT 113* 03/18/2014 1358   ALKPHOS 122 03/18/2014 1358   BILITOT 0.27 03/18/2014 1358   GFRNONAA >90 04/04/2014 1520   GFRAA >90 04/04/2014 1520    I No results found for  this basename: SPEP,  UPEP,   kappa and lambda light chains    Lab Results  Component Value Date   WBC 6.5 04/10/2014   NEUTROABS 3.9 04/10/2014   HGB 11.6 04/10/2014   HCT 35.7 04/10/2014   MCV 99.2 04/10/2014   PLT 185 04/10/2014      Chemistry      Component Value Date/Time   NA 143 04/04/2014 1520   NA 143 03/18/2014 1358   K 3.9 04/04/2014 1520   K 4.4 03/18/2014 1358   CL 106 04/04/2014 1520   CO2 24 04/04/2014 1520   CO2 26 03/18/2014 1358   BUN 11 04/04/2014 1520   BUN 6.4* 03/18/2014 1358   CREATININE 0.58 04/04/2014 1520   CREATININE 0.7 03/18/2014 1358  Component Value Date/Time   CALCIUM 9.5 04/04/2014 1520   CALCIUM 9.6 03/18/2014 1358   ALKPHOS 122 03/18/2014 1358   AST 60* 03/18/2014 1358   ALT 113* 03/18/2014 1358   BILITOT 0.27 03/18/2014 1358       No results found for this basename: LABCA2    No components found with this basename: [id number]    No results found for this basename: INR,  in the last 168 hours  Urinalysis    Component Value Date/Time   LABSPEC 1.005 01/01/2014 0947   GLUCOSEU Negative 01/01/2014 0947   UROBILINOGEN 0.2 01/01/2014 0947    STUDIES: Mr Breast Bilateral W Wo Contrast  02/19/2014   CLINICAL DATA:  53 year old female with a history of left breast cancer diagnosed at age 15 in 11 in [place]. She underwent lumpectomy, lymph node dissection, and chemotherapy at that time. She has a history of previous radiation therapy to the left breast in 2011. She moved to [place] in 2014 and recent evaluation demonstrated a mass within the left breast located at the 1 o'clock position. Ultrasound-guided core biopsy 10/04/2013 demonstrated invasive ductal carcinoma. She has undergone neoadjuvant chemotherapy. Assess response to neoadjuvant therapy.  EXAM: BILATERAL BREAST MRI WITH AND WITHOUT CONTRAST  TECHNIQUE: Multiplanar, multisequence MR images of both breasts were obtained prior to and following the intravenous administration of 61m of MultiHance   THREE-DIMENSIONAL MR IMAGE RENDERING ON INDEPENDENT WORKSTATION:  Three-dimensional MR images were rendered by post-processing of the original MR data on an independent workstation. The three-dimensional MR images were interpreted, and findings are reported in the following complete MRI report for this study. Three dimensional images were evaluated at the independent DynaCad workstation  COMPARISON:  Previous breast MRI dated 10/12/2013.  FINDINGS: Breast composition: b.  Scattered fibroglandular tissue.  Background parenchymal enhancement: Mild  Right breast: No mass or abnormal enhancement.  Left breast: The previously seen 2.2 cm area of irregular enhancement with central clip artifact located within the anterior portion of the upper-outer quadrant of the left breast corresponding to the recently diagnosed invasive ductal carcinoma has markedly decreased in size and now only a faint area of vague enhancement remains in this region. The previously described irregular mass located 1 cm posterior to this area of enhancement has also decreased in size and now measures 9 x 7 x 4 mm in size and previously measured 1.4 x 1.2 x 0.8 cm in size. There are no new foci within the left breast.  Lymph nodes: No abnormal appearing lymph nodes.  Ancillary findings:  None.  IMPRESSION: Significant improvement in the areas of enhancement located within the upper outer quadrant of the left breast as discussed above. Otherwise, no significant change.  RECOMMENDATION: Treatment plan.  BI-RADS CATEGORY  6: Known biopsy-proven malignancy.   Electronically Signed   By: RLuberta RobertsonM.D.   On: 02/19/2014 15:20    ASSESSMENT: 53y.o. BRCA negative [place], [place]   (1) status post left [place], grade 2, treated with adjuvant chemotherapy (specific drugs not clear) and adjuvant radiation.  (2) status post left  breast upper outer quadrant biopsy 10/04/2013 for a clinically mT2 N0, stage IIA invasive ductal carcinoma, grade 2, estrogen receptor 100% positive, progesterone receptor 33% positive, with an MIB-1 of 15%, and HER-2 amplified; staging studies showed no evidence of metastatic disease  (3) started on neoadjuvant carboplatin/ docetaxel/ trastuzumab/ pertuzumab 11/12/2013  (  a) completed 4 cycles as of 05/06/201, after which docetaxel was discontinued because of neuropathy symptoms   (b) gemcitabine was given day 1 and day 8 with carboplatin day 1 in the final 2 cycles, completed 03/04/2014  (4) surgery  scheduled for 04/12/2014  (5) aromatase inhibitors to follow surgery  (6) genetic testing February 2015 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    PLAN: Madeliene is tolerating the trastuzumab well and the plan is to continue that through March of next year. She will need her next echocardiogram in September.  She is now ready for surgery, which will be a left mastectomy with immediate reconstruction. It as I understand it, the plan will be for a latissimus flap plus and expander, with the implants placed perhaps 2 months later.  I explained Dabney that her joints are going to feel a little stiff just the same way as her skin feels a little dry and her hair is just beginning to come back. It will take her body a while to get rid of the effects of chemotherapy. She can speed up the process however with appropriate exercise. Ideally she would take probably less than 6. Short of that yoga is very good. I gave her a copy of the [employer] pamphlet and perhaps she and Kelsey Mclaughlin can start going to the wire regularly for chiefly stretching stype exercises but also some cardio, as soon as possible to herThimmappa dear Sir the clearance   Kelsey Mclaughlin has a good understanding of the overall plan. She agrees with it. She knows the goal of treatment in her case is cure. She will call with any problems that  may develop before her next visit here.  [name], MD   04/10/2014 9:44 AM

## 2014-04-11 MED ORDER — CEFAZOLIN SODIUM-DEXTROSE 2-3 GM-% IV SOLR
2.0000 g | INTRAVENOUS | Status: AC
  Administered 2014-04-12 (×2): 2 g via INTRAVENOUS
  Filled 2014-04-11: qty 50

## 2014-04-11 MED ORDER — CHLORHEXIDINE GLUCONATE 4 % EX LIQD
1.0000 "application " | Freq: Once | CUTANEOUS | Status: DC
  Filled 2014-04-11: qty 15

## 2014-04-11 NOTE — H&P (Signed)
  Subjective:   Patient ID: Kelsey Mclaughlin is a 53 y.o. female.  Follow-up   Here for follow up discussion of breast reconstruction. She was diagnosed early 2015 with recurrent left breast cancer. The patient was originally diagnosed at age 7 with invasive ductal carcinoma in the left upper outer quadrant near the axilla. The patient was in [place] and underwent lumpectomy and full axillary lymph node dissection with ALND, XRT, chemotherapy. 2/26 lymph nodes positive Over the last 4 years she has felt a small lump in the upper-outer quadrant of her left breast. She had MMG and US performed but no biopsies were ever performed. Screening MMG in December 2014 demonstrated breast asymmetry and follow up biospy demonstrated mass measuring 1 x 0.4 x 1.7 cm about 2 cm from the nipple. , IDC, ER/PR +, Her 2 +. She has completed neoadjuvant chemotherapy. She has undergone genetic testing which demonstrated VUS. She has lymphedema of LUE and uses sleeve and compression pump.  Current bra C cup on larger R breast side. Desires this or larger.  Weight stable.  Review of Systems   Objective:   Physical Exam  Cardiovascular: Normal rate and normal heart sounds.  Pulmonary/Chest: Effort normal and breath sounds normal.  Abdominal:  Incisions from liposuction lateral abdomen  Genitourinary:  Left axilla hypertrophic scar extends onto upper outer quadrant breast  SN to nipple R 26 L 24 cm BW R 16 L 14 cm Nipple to IMF R 8 L 6.5    Assessment:    Left breast cancer   Plan:   Plan immediate latissimus flap with TE placement.. Reviewed TE placement, expansion process and placement of permanent implant. Reviewed risks including but not limited to bleeding, seroma, extrusion wound healing problems, implant risks inc rupture, infection, need for additional surgery, asymmetry with left breast, contracture, DVT/PE, hematoma, damage to deeper structures, unacceptable cosmetic appearance, and cardiopulmonary  complications. Discussed matching procedure could be done at time of exchange for implant.  Irene Limbo, MD [facility] Plastic & Reconstructive Surgery [phone]

## 2014-04-12 ENCOUNTER — Encounter (HOSPITAL_COMMUNITY)
Admission: RE | Disposition: A | Discharge status: Home / Self Care | Payer: Self-pay | Source: Ambulatory Visit | Attending: Surgery

## 2014-04-12 ENCOUNTER — Ambulatory Visit (HOSPITAL_COMMUNITY): Payer: Federal, State, Local not specified - PPO | Admitting: Certified Registered Nurse Anesthetist

## 2014-04-12 ENCOUNTER — Encounter (HOSPITAL_COMMUNITY): Payer: Federal, State, Local not specified - PPO | Admitting: Certified Registered Nurse Anesthetist

## 2014-04-12 ENCOUNTER — Encounter (HOSPITAL_COMMUNITY): Payer: Self-pay | Admitting: *Deleted

## 2014-04-12 ENCOUNTER — Inpatient Hospital Stay (HOSPITAL_COMMUNITY)
Admission: RE | Admit: 2014-04-12 | Discharge: 2014-04-13 | DRG: 581 | Disposition: A | Discharge status: Home / Self Care | Payer: Federal, State, Local not specified - PPO | Source: Ambulatory Visit | Attending: Surgery | Admitting: Surgery

## 2014-04-12 DIAGNOSIS — Z803 Family history of malignant neoplasm of breast: Secondary | ICD-10-CM

## 2014-04-12 DIAGNOSIS — Z923 Personal history of irradiation: Secondary | ICD-10-CM

## 2014-04-12 DIAGNOSIS — I10 Essential (primary) hypertension: Secondary | ICD-10-CM | POA: Diagnosis present

## 2014-04-12 DIAGNOSIS — Z79899 Other long term (current) drug therapy: Secondary | ICD-10-CM

## 2014-04-12 DIAGNOSIS — Z9221 Personal history of antineoplastic chemotherapy: Secondary | ICD-10-CM

## 2014-04-12 DIAGNOSIS — C50419 Malignant neoplasm of upper-outer quadrant of unspecified female breast: Principal | ICD-10-CM | POA: Diagnosis present

## 2014-04-12 DIAGNOSIS — Z170 Estrogen receptor positive status [ER+]: Secondary | ICD-10-CM

## 2014-04-12 DIAGNOSIS — C50919 Malignant neoplasm of unspecified site of unspecified female breast: Secondary | ICD-10-CM | POA: Diagnosis present

## 2014-04-12 DIAGNOSIS — C50412 Malignant neoplasm of upper-outer quadrant of left female breast: Secondary | ICD-10-CM

## 2014-04-12 DIAGNOSIS — Z8673 Personal history of transient ischemic attack (TIA), and cerebral infarction without residual deficits: Secondary | ICD-10-CM

## 2014-04-12 DIAGNOSIS — I890 Lymphedema, not elsewhere classified: Secondary | ICD-10-CM | POA: Diagnosis present

## 2014-04-12 HISTORY — PX: SIMPLE MASTECTOMY WITH AXILLARY SENTINEL NODE BIOPSY: SHX6098

## 2014-04-12 HISTORY — PX: TISSUE EXPANDER PLACEMENT: SHX2530

## 2014-04-12 HISTORY — PX: LATISSIMUS FLAP TO BREAST: SHX5357

## 2014-04-12 LAB — GLUCOSE, CAPILLARY: Glucose-Capillary: 154 mg/dL — ABNORMAL HIGH (ref 70–99)

## 2014-04-12 SURGERY — SIMPLE MASTECTOMY
Anesthesia: General | Site: Breast | Laterality: Left

## 2014-04-12 MED ORDER — POTASSIUM CHLORIDE IN NACL 20-0.9 MEQ/L-% IV SOLN
INTRAVENOUS | Status: DC
  Administered 2014-04-12 – 2014-04-13 (×2): via INTRAVENOUS
  Filled 2014-04-12 (×5): qty 1000

## 2014-04-12 MED ORDER — PROPOFOL 10 MG/ML IV BOLUS
INTRAVENOUS | Status: AC
  Filled 2014-04-12: qty 20

## 2014-04-12 MED ORDER — MIDAZOLAM HCL 5 MG/5ML IJ SOLN
INTRAMUSCULAR | Status: DC | PRN
  Administered 2014-04-12: 2 mg via INTRAVENOUS

## 2014-04-12 MED ORDER — ONDANSETRON HCL 4 MG PO TABS: 4.0000 mg | ORAL_TABLET | Freq: Four times a day (QID) | ORAL | Status: DC | PRN

## 2014-04-12 MED ORDER — OXYCODONE-ACETAMINOPHEN 5-325 MG PO TABS: 1.0000 | ORAL_TABLET | ORAL | Status: DC | PRN

## 2014-04-12 MED ORDER — ONDANSETRON HCL 4 MG/2ML IJ SOLN
INTRAMUSCULAR | Status: DC | PRN
  Administered 2014-04-12: 4 mg via INTRAVENOUS

## 2014-04-12 MED ORDER — SODIUM CHLORIDE 0.9 % IJ SOLN
INTRAMUSCULAR | Status: AC
  Filled 2014-04-12: qty 10

## 2014-04-12 MED ORDER — BUPIVACAINE-EPINEPHRINE (PF) 0.5% -1:200000 IJ SOLN
INTRAMUSCULAR | Status: DC | PRN
  Administered 2014-04-12: 40 mL

## 2014-04-12 MED ORDER — PROPOFOL 10 MG/ML IV BOLUS
INTRAVENOUS | Status: DC | PRN
  Administered 2014-04-12: 300 mg via INTRAVENOUS

## 2014-04-12 MED ORDER — OXYCODONE HCL 5 MG/5ML PO SOLN: 5.0000 mg | Freq: Once | ORAL | Status: DC | PRN

## 2014-04-12 MED ORDER — ENOXAPARIN SODIUM 40 MG/0.4ML ~~LOC~~ SOLN
40.0000 mg | SUBCUTANEOUS | Status: DC
  Filled 2014-04-12: qty 0.4

## 2014-04-12 MED ORDER — BUPIVACAINE-EPINEPHRINE (PF) 0.25% -1:200000 IJ SOLN
INTRAMUSCULAR | Status: AC
  Filled 2014-04-12: qty 30

## 2014-04-12 MED ORDER — ROCURONIUM BROMIDE 100 MG/10ML IV SOLN
INTRAVENOUS | Status: DC | PRN
  Administered 2014-04-12 (×2): 50 mg via INTRAVENOUS

## 2014-04-12 MED ORDER — OXYCODONE HCL 5 MG PO TABS: 5.0000 mg | ORAL_TABLET | Freq: Once | ORAL | Status: DC | PRN

## 2014-04-12 MED ORDER — CEFAZOLIN SODIUM 1-5 GM-% IV SOLN
1.0000 g | Freq: Three times a day (TID) | INTRAVENOUS | Status: DC
  Administered 2014-04-12 – 2014-04-13 (×3): 1 g via INTRAVENOUS
  Filled 2014-04-12 (×4): qty 50

## 2014-04-12 MED ORDER — EPHEDRINE SULFATE 50 MG/ML IJ SOLN
INTRAMUSCULAR | Status: DC | PRN
  Administered 2014-04-12 (×2): 10 mg via INTRAVENOUS

## 2014-04-12 MED ORDER — SODIUM CHLORIDE 0.9 % IR SOLN
Status: DC | PRN
  Administered 2014-04-12: 1000 mL

## 2014-04-12 MED ORDER — MIDAZOLAM HCL 2 MG/2ML IJ SOLN
INTRAMUSCULAR | Status: AC
  Filled 2014-04-12: qty 2

## 2014-04-12 MED ORDER — HYDROMORPHONE HCL PF 1 MG/ML IJ SOLN
INTRAMUSCULAR | Status: AC
  Filled 2014-04-12: qty 1

## 2014-04-12 MED ORDER — HYDROMORPHONE HCL PF 1 MG/ML IJ SOLN: 0.5000 mg | INTRAMUSCULAR | Status: DC | PRN

## 2014-04-12 MED ORDER — PHENYLEPHRINE HCL 10 MG/ML IJ SOLN
INTRAMUSCULAR | Status: DC | PRN
  Administered 2014-04-12 (×2): 80 ug via INTRAVENOUS

## 2014-04-12 MED ORDER — LIDOCAINE HCL (CARDIAC) 20 MG/ML IV SOLN
INTRAVENOUS | Status: DC | PRN
  Administered 2014-04-12: 60 mg via INTRAVENOUS

## 2014-04-12 MED ORDER — BUPIVACAINE-EPINEPHRINE 0.25% -1:200000 IJ SOLN
INTRAMUSCULAR | Status: DC | PRN
  Administered 2014-04-12: 30 mL

## 2014-04-12 MED ORDER — FENTANYL CITRATE 0.05 MG/ML IJ SOLN
INTRAMUSCULAR | Status: AC
  Filled 2014-04-12: qty 5

## 2014-04-12 MED ORDER — HYDROMORPHONE HCL PF 1 MG/ML IJ SOLN
0.2500 mg | INTRAMUSCULAR | Status: DC | PRN
  Administered 2014-04-12 (×2): 0.5 mg via INTRAVENOUS

## 2014-04-12 MED ORDER — EPHEDRINE SULFATE 50 MG/ML IJ SOLN
INTRAMUSCULAR | Status: AC
  Filled 2014-04-12: qty 1

## 2014-04-12 MED ORDER — NEOSTIGMINE METHYLSULFATE 10 MG/10ML IV SOLN
INTRAVENOUS | Status: DC | PRN
  Administered 2014-04-12: 4 mg via INTRAVENOUS

## 2014-04-12 MED ORDER — ONDANSETRON HCL 4 MG/2ML IJ SOLN: 4.0000 mg | Freq: Four times a day (QID) | INTRAMUSCULAR | Status: DC | PRN

## 2014-04-12 MED ORDER — KETOROLAC TROMETHAMINE 15 MG/ML IJ SOLN
15.0000 mg | Freq: Three times a day (TID) | INTRAMUSCULAR | Status: DC
  Administered 2014-04-12 – 2014-04-13 (×3): 15 mg via INTRAVENOUS
  Filled 2014-04-12 (×5): qty 1

## 2014-04-12 MED ORDER — DEXAMETHASONE SODIUM PHOSPHATE 10 MG/ML IJ SOLN
INTRAMUSCULAR | Status: DC | PRN
  Administered 2014-04-12: 10 mg via INTRAVENOUS

## 2014-04-12 MED ORDER — POLYMYXIN B SULFATE 500000 UNITS IJ SOLR
INTRAMUSCULAR | Status: DC | PRN
  Administered 2014-04-12: 11:00:00

## 2014-04-12 MED ORDER — FENTANYL CITRATE 0.05 MG/ML IJ SOLN
INTRAMUSCULAR | Status: DC | PRN
  Administered 2014-04-12: 50 ug via INTRAVENOUS
  Administered 2014-04-12: 100 ug via INTRAVENOUS
  Administered 2014-04-12 (×5): 50 ug via INTRAVENOUS
  Administered 2014-04-12: 100 ug via INTRAVENOUS

## 2014-04-12 MED ORDER — LACTATED RINGERS IV SOLN
INTRAVENOUS | Status: DC | PRN
  Administered 2014-04-12 (×3): via INTRAVENOUS

## 2014-04-12 MED ORDER — GLYCOPYRROLATE 0.2 MG/ML IJ SOLN
INTRAMUSCULAR | Status: DC | PRN
  Administered 2014-04-12: 0.6 mg via INTRAVENOUS

## 2014-04-12 MED ORDER — METOCLOPRAMIDE HCL 5 MG/ML IJ SOLN
INTRAMUSCULAR | Status: DC | PRN
  Administered 2014-04-12: 10 mg via INTRAVENOUS

## 2014-04-12 MED ORDER — 0.9 % SODIUM CHLORIDE (POUR BTL) OPTIME
TOPICAL | Status: DC | PRN
  Administered 2014-04-12 (×2): 1000 mL

## 2014-04-12 SURGICAL SUPPLY — 79 items
BAG DECANTER FOR FLEXI CONT (MISCELLANEOUS) ×3 IMPLANT
BINDER BREAST XLRG (GAUZE/BANDAGES/DRESSINGS) ×3 IMPLANT
BLADE SURG 10 STRL SS (BLADE) ×6 IMPLANT
BLADE SURG 15 STRL LF DISP TIS (BLADE) ×2 IMPLANT
BLADE SURG 15 STRL SS (BLADE) ×1
CANISTER SUCTION 2500CC (MISCELLANEOUS) ×6 IMPLANT
CHLORAPREP W/TINT 26ML (MISCELLANEOUS) ×6 IMPLANT
COVER SURGICAL LIGHT HANDLE (MISCELLANEOUS) ×3 IMPLANT
DERMABOND ADVANCED (GAUZE/BANDAGES/DRESSINGS) ×5
DERMABOND ADVANCED .7 DNX12 (GAUZE/BANDAGES/DRESSINGS) ×10 IMPLANT
DRAIN CHANNEL 19F RND (DRAIN) ×6 IMPLANT
DRAPE CHEST BREAST 15X10 FENES (DRAPES) ×3 IMPLANT
DRAPE INCISE 23X17 IOBAN STRL (DRAPES)
DRAPE INCISE IOBAN 23X17 STRL (DRAPES) IMPLANT
DRAPE INCISE IOBAN 66X45 STRL (DRAPES) ×3 IMPLANT
DRAPE INCISE IOBAN 85X60 (DRAPES) IMPLANT
DRAPE ORTHO SPLIT 77X108 STRL (DRAPES) ×2
DRAPE PROXIMA HALF (DRAPES) ×15 IMPLANT
DRAPE SURG ORHT 6 SPLT 77X108 (DRAPES) ×4 IMPLANT
DRAPE UTILITY 15X26 W/TAPE STR (DRAPE) ×6 IMPLANT
DRAPE WARM FLUID 44X44 (DRAPE) ×3 IMPLANT
DRSG MEPILEX BORDER 4X8 (GAUZE/BANDAGES/DRESSINGS) ×3 IMPLANT
DRSG PAD ABDOMINAL 8X10 ST (GAUZE/BANDAGES/DRESSINGS) ×3 IMPLANT
ELECT BLADE 4.0 EZ CLEAN MEGAD (MISCELLANEOUS) ×3
ELECT BLADE 6.5 EXT (BLADE) ×3 IMPLANT
ELECT CAUTERY BLADE 6.4 (BLADE) ×12 IMPLANT
ELECT REM PT RETURN 9FT ADLT (ELECTROSURGICAL) ×3
ELECTRODE BLDE 4.0 EZ CLN MEGD (MISCELLANEOUS) ×2 IMPLANT
ELECTRODE REM PT RTRN 9FT ADLT (ELECTROSURGICAL) ×2 IMPLANT
EVACUATOR SILICONE 100CC (DRAIN) ×9 IMPLANT
GLOVE BIO SURGEON STRL SZ 6 (GLOVE) ×12 IMPLANT
GLOVE BIO SURGEON STRL SZ7 (GLOVE) ×3 IMPLANT
GLOVE BIOGEL PI IND STRL 7.0 (GLOVE) ×2 IMPLANT
GLOVE BIOGEL PI IND STRL 7.5 (GLOVE) ×10 IMPLANT
GLOVE BIOGEL PI INDICATOR 7.0 (GLOVE) ×1
GLOVE BIOGEL PI INDICATOR 7.5 (GLOVE) ×5
GLOVE ECLIPSE 7.5 STRL STRAW (GLOVE) ×6 IMPLANT
GLOVE SURG SS PI 6.0 STRL IVOR (GLOVE) ×3 IMPLANT
GLOVE SURG SS PI 6.5 STRL IVOR (GLOVE) ×9 IMPLANT
GLOVE SURG SS PI 7.0 STRL IVOR (GLOVE) ×6 IMPLANT
GOWN STRL REUS W/ TWL LRG LVL3 (GOWN DISPOSABLE) ×12 IMPLANT
GOWN STRL REUS W/TWL LRG LVL3 (GOWN DISPOSABLE) ×6
IMPL BREAST TIS EXP M 350CC (Breast) ×2 IMPLANT
IMPLANT BREAST TIS EXP M 350CC (Breast) ×3 IMPLANT
KIT BASIN OR (CUSTOM PROCEDURE TRAY) ×3 IMPLANT
KIT ROOM TURNOVER OR (KITS) ×3 IMPLANT
MARKER SKIN DUAL TIP RULER LAB (MISCELLANEOUS) ×3 IMPLANT
NS IRRIG 1000ML POUR BTL (IV SOLUTION) ×6 IMPLANT
PACK GENERAL/GYN (CUSTOM PROCEDURE TRAY) ×3 IMPLANT
PAD ARMBOARD 7.5X6 YLW CONV (MISCELLANEOUS) ×12 IMPLANT
PENCIL BUTTON HOLSTER BLD 10FT (ELECTRODE) ×3 IMPLANT
PIN SAFETY STERILE (MISCELLANEOUS) ×3 IMPLANT
SOLUTION BETADINE 4OZ (MISCELLANEOUS) ×3 IMPLANT
SPECIMEN JAR X LARGE (MISCELLANEOUS) ×3 IMPLANT
SPONGE GAUZE 4X4 12PLY STER LF (GAUZE/BANDAGES/DRESSINGS) ×6 IMPLANT
SPONGE LAP 18X18 X RAY DECT (DISPOSABLE) ×9 IMPLANT
STAPLER VISISTAT 35W (STAPLE) ×3 IMPLANT
STOCKINETTE IMPERVIOUS 9X36 MD (GAUZE/BANDAGES/DRESSINGS) ×3 IMPLANT
STRIP CLOSURE SKIN 1/2X4 (GAUZE/BANDAGES/DRESSINGS) ×15 IMPLANT
SUT ETHILON 2 0 FS 18 (SUTURE) ×6 IMPLANT
SUT ETHILON 3 0 FSL (SUTURE) ×3 IMPLANT
SUT MNCRL AB 3-0 PS2 18 (SUTURE) ×12 IMPLANT
SUT MNCRL AB 4-0 PS2 18 (SUTURE) ×15 IMPLANT
SUT MON AB 5-0 PS2 18 (SUTURE) ×6 IMPLANT
SUT PDS AB 2-0 CT1 27 (SUTURE) ×3 IMPLANT
SUT PROLENE 2 0 FS (SUTURE) ×18 IMPLANT
SUT VIC AB 3-0 PS2 18 (SUTURE) ×6
SUT VIC AB 3-0 PS2 18XBRD (SUTURE) ×12 IMPLANT
SUT VIC AB 3-0 SH 18 (SUTURE) ×3 IMPLANT
SUT VIC AB 3-0 SH 8-18 (SUTURE) ×3 IMPLANT
SUT VIC AB 4-0 PS2 27 (SUTURE) ×18 IMPLANT
SYR 50ML SLIP (SYRINGE) IMPLANT
SYR BULB IRRIGATION 50ML (SYRINGE) ×3 IMPLANT
SYRINGE CONTROL L 12CC (SYRINGE) ×3 IMPLANT
TAPE CLOTH SURG 4X10 WHT LF (GAUZE/BANDAGES/DRESSINGS) ×6 IMPLANT
TOWEL OR 17X24 6PK STRL BLUE (TOWEL DISPOSABLE) ×6 IMPLANT
TOWEL OR 17X26 10 PK STRL BLUE (TOWEL DISPOSABLE) ×6 IMPLANT
TRAY FOLEY CATH 16FRSI W/METER (SET/KITS/TRAYS/PACK) ×3 IMPLANT
TUBE CONNECTING 12X1/4 (SUCTIONS) ×6 IMPLANT

## 2014-04-12 NOTE — Op Note (Signed)
Preop diagnosis: Recurrent left breast cancer Postop diagnosis: Same Procedure performed: Left simple mastectomy Surgeon:Shakaria Raphael K. Co-surgeon:Dr. Roel Cluck Anesthesia:  GETT Indications:  This is a 53 year old female who is status post left lumpectomy and full axillary lymph node dissection as well as chemotherapy and radiation for left breast cancer. This was several years ago. She has had a recurrence of left breast cancer. She received neoadjuvant chemotherapy. She presents now for mastectomy to be followed by a latissimus flap and tissue expander reconstruction by plastic surgery.  Description of procedure: The patient brought to the operating room and placed in a supine position on the operating room table.After an adequate level of general anesthesia was obtained her left chest was prepped with ChloraPrep and draped in sterile fashion. A timeout was taken to ensure the proper patient and proper procedure. We outlined an elliptical incision around her nipple areolar complex. Skin incisions were made with a #10 blade. We raised the superior skin flap using cautery up to the infraclavicular fossa. Medially we went to the edge of the sternum. We dissected until we were at the chest wall. We continued laterally until we had reached the area of the previous axillary dissection. There was some scarring in the axilla. We then raised inferior skin flap the down to the inframammary crease. Continued laterally until we reached the anterior edge of the latissimus muscle. Continued our dissection up to the axilla. I then used cautery to dissect the breast tissue free from the underlying pectoralis muscle. We took the anterior pectoralis fascia with the specimen.  The specimen was removed and oriented with a suture laterally. We inspected for hemostasis. The case was then turned over to plastic surgery for reconstruction.  Imogene Burn. Georgette Dover, MD, [facility] Surgery  General/ Trauma  Surgery  04/12/2014 8:36 AM

## 2014-04-12 NOTE — Anesthesia Postprocedure Evaluation (Signed)
  Anesthesia Post-op Note  Patient: Kelsey Mclaughlin  Procedure(s) Performed: Procedure(s): MASTECTOMY (Left) LEFT LATISSIMUS DORSI FLAP FOR LEFT BREAST RECONSTRUCTION AND PLACEMENT OF TISSUE EXPANDER (Left) TISSUE EXPANDER (Left)  Patient Location: PACU  Anesthesia Type:General and block  Level of Consciousness: awake and alert   Airway and Oxygen Therapy: Patient Spontanous Breathing  Post-op Pain: none  Post-op Assessment: Post-op Vital signs reviewed, Patient's Cardiovascular Status Stable and Respiratory Function Stable  Post-op Vital Signs: Reviewed  Filed Vitals:   04/12/14 1300  BP: 156/91  Pulse:   Temp: 36.6 C  Resp:     Complications: No apparent anesthesia complications

## 2014-04-12 NOTE — H&P (Signed)
Breast Cancer Long Term Follow Up   HPI  Kelsey Mclaughlin is a 53 y.o. female. Follow-up for recurrent left breast cancer  HPI  This is a 53 yo female that presents after diagnosis of left breast cancer at age 49. This was treated in [place] with lumpectomy and axillary lymph node dissection. She received 6 months of chemotherapy as well as radiation therapy. She presented in 2011 with a palpable mass in the left upper outer quadrant. She had mammograms and ultrasounds performed of this area but no biopsies were ever performed. Recently she moved to To [facility]. She underwent screening mammogram in December and was brought back for a diagnostic left mammogram for left breast asymmetry on 10/02/13. At 1:00 in the left breast about 2 cm from the nipple there is an irregular hypoechoic mass measuring 1 x 0.4 x 1.7 cm. 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 ER and PR are positive Ki-67 15%. MRI showed the recurrent carcinoma, as well as a suspicious mass 1 cm posterior to the biopsy site.  She underwent port placement and begin neoadjuvant chemotherapy. She is due to complete her chemotherapy in early July. She is also undergoing physical therapy for her left arm lymphedema. Initially, she felt that she wanted bilateral mastectomies. However she underwent genetic counseling and testing and does not carry a mutation. After further counseling and she understands that there is no survival benefit to a right mastectomy. She now has decided she would like a left mastectomy with immediate reconstruction. She has had consultations with 2 different plastic surgeons and wishes to proceed with Dr. Renold Genta. According to the last note, she will probably proceed with a tissue expander with delayed autologous reconstruction. However this decision has yet to be finalized.  Recent MRI showed significant improvement in the area of  the primary tumor. She comes in today to discuss surgical planning. She is not having any problems with her right subclavian port.  Past Medical History   Diagnosis  Date   .  Cancer  1998, 2015     breast   .  Hypertension    .  Bronchitis      completed antibiotics 10/21/13/ states resolved   .  Stroke  2004     TIA- no problems since    Past Surgical History   Procedure  Laterality  Date   .  Breast surgery   1998     lumpectomy on left and removed lymphnodes in [place]   .  Portacath placement  Right  10/25/2013     Procedure: INSERTION PORT-A-CATH; Surgeon: Imogene Burn. Georgette Dover, MD; Location: [facility]; Service: General; Laterality: Right;    Family History   Problem  Relation  Age of Onset   .  Breast cancer  Mother  58   .  Heart disease  Father    .  Heart attack  Father    .  Breast cancer  Maternal Grandmother  65   .  Brain cancer  Maternal Uncle  74     benign brain tumor   .  Heart attack  Paternal Aunt    Social History  History   Substance Use Topics   .  Smoking status:  Never Smoker   .  Smokeless tobacco:  Never Used   .  Alcohol Use:  Yes      Comment: 2 drinks/day   No Known Allergies  Current Outpatient Prescriptions   Medication  Sig  Dispense  Refill   .  dexamethasone (DECADRON) 4 MG tablet  TAKE 2 TABLETS TWICE DAILY START DAY BEFORE CHEMO THEN AGAIN AFTER CHEMO FOR 3 DAYS  30 tablet  1   .  lidocaine-prilocaine (EMLA) cream  Apply to port-a-cath 1 - 2 hours prior to being accessed.  30 g  3    No current facility-administered medications for this visit.   Review of Systems  Review of Systems  Constitutional: Negative for fever, chills and unexpected weight change.  HENT: Negative for congestion, hearing loss, sore throat, trouble swallowing and voice change.  Eyes: Negative for visual disturbance.  Respiratory: Negative for cough and wheezing.  Cardiovascular: Negative for chest pain, palpitations and leg swelling.  Gastrointestinal: Negative for  nausea, vomiting, abdominal pain, diarrhea, constipation, blood in stool, abdominal distention and anal bleeding.  Genitourinary: Negative for hematuria, vaginal bleeding and difficulty urinating.  Musculoskeletal: Negative for arthralgias.  Skin: Negative for rash and wound.  Neurological: Negative for seizures, syncope and headaches.  Peripheral neuropathy  Hematological: Negative for adenopathy. Does not bruise/bleed easily.  Left arm lymphedema  Psychiatric/Behavioral: Negative for confusion.  Blood pressure 134/76, pulse 74, temperature 98 F (36.7 C), height '5\' 7"'  (1.702 m), weight 187 lb (84.823 kg).  Physical Exam  Physical Exam  WDWN in NAD  HEENT: EOMI, sclera anicteric  Neck: No masses, no thyromegaly  Breasts: No right breast masses; no axillary lymphadenopathy  Smaller LUOQ mass, softer; healed left axillary scar  Lungs: CTA bilaterally; normal respiratory effort  CV: Regular rate and rhythm; no murmurs  Abd: +bowel sounds, soft, non-tender, no masses  Ext: Well-perfused; no edema  Skin: Warm, dry; no sign of jaundice  Data Reviewed  Mr Breast Bilateral W Wo Contrast  02/19/2014 CLINICAL DATA: 53 year old female with a history of left breast cancer diagnosed at age 29 in 9 in [place]. She underwent lumpectomy, lymph node dissection, and chemotherapy at that time. She has a history of previous radiation therapy to the left breast in 2011. She moved to [place] in 2014 and recent evaluation demonstrated a mass within the left breast located at the 1 o'clock position. Ultrasound-guided core biopsy 10/04/2013 demonstrated invasive ductal carcinoma. She has undergone neoadjuvant chemotherapy. Assess response to neoadjuvant therapy. EXAM: BILATERAL BREAST MRI WITH AND WITHOUT CONTRAST TECHNIQUE: Multiplanar, multisequence MR images of both breasts were obtained prior to and following the intravenous administration of 83m of MultiHance THREE-DIMENSIONAL MR IMAGE RENDERING ON  INDEPENDENT WORKSTATION: Three-dimensional MR images were rendered by post-processing of the original MR data on an independent workstation. The three-dimensional MR images were interpreted, and findings are reported in the following complete MRI report for this study. Three dimensional images were evaluated at the independent DynaCad workstation COMPARISON: Previous breast MRI dated 10/12/2013. FINDINGS: Breast composition: b. Scattered fibroglandular tissue. Background parenchymal enhancement: Mild Right breast: No mass or abnormal enhancement. Left breast: The previously seen 2.2 cm area of irregular enhancement with central clip artifact located within the anterior portion of the upper-outer quadrant of the left breast corresponding to the recently diagnosed invasive ductal carcinoma has markedly decreased in size and now only a faint area of vague enhancement remains in this region. The previously described irregular mass located 1 cm posterior to this area of enhancement has also decreased in size and now measures 9 x 7 x 4 mm in size and previously measured 1.4 x 1.2 x 0.8 cm in size. There are no  new foci within the left breast. Lymph nodes: No abnormal appearing lymph nodes. Ancillary findings: None. IMPRESSION: Significant improvement in the areas of enhancement located within the upper outer quadrant of the left breast as discussed above. Otherwise, no significant change. RECOMMENDATION: Treatment plan. BI-RADS CATEGORY 6: Known biopsy-proven malignancy. Electronically Signed By: Luberta Robertson M.D. On: 02/19/2014 15:20  Assessment  mT2 N0, stage IIA invasive ductal carcinoma, grade 2, estrogen receptor 100% positive, progesterone receptor 33% positive, with an MIB-1 of 15%, and HER-2 amplified; staging studies showed no evidence of metastatic disease  Plan  Left mastectomy with immediate reconstruction by Dr. Renold Genta. The patient will have another consultation with Dr. Renold Genta to finalize their  surgical plan. The surgical procedure has been discussed with the patient. Potential risks, benefits, alternative treatments, and expected outcomes have been explained. All of the patient's questions at this time have been answered. The likelihood of reaching the patient's treatment goal is good. The patient understand the proposed surgical procedure and wishes to proceed.    Imogene Burn. Georgette Dover, MD, [facility] Surgery  General/ Trauma Surgery  04/12/2014 7:01 AM

## 2014-04-12 NOTE — Anesthesia Preprocedure Evaluation (Addendum)
Anesthesia Evaluation  Patient identified by MRN, date of birth, ID band Patient awake    Reviewed: Allergy & Precautions, H&P , NPO status , Patient's Chart, lab work & pertinent test results  Airway Mallampati: II TM Distance: >3 FB Neck ROM: Full    Dental no notable dental hx. (+) Teeth Intact, Dental Advisory Given   Pulmonary neg pulmonary ROS,  breath sounds clear to auscultation  Pulmonary exam normal       Cardiovascular hypertension, Rhythm:Regular Rate:Normal     Neuro/Psych CVA, No Residual Symptoms negative psych ROS   GI/Hepatic negative GI ROS, Neg liver ROS,   Endo/Other  negative endocrine ROS  Renal/GU negative Renal ROS  negative genitourinary   Musculoskeletal   Abdominal   Peds  Hematology negative hematology ROS (+)   Anesthesia Other Findings   Reproductive/Obstetrics negative OB ROS                          Anesthesia Physical Anesthesia Plan  ASA: II  Anesthesia Plan: General   Post-op Pain Management:    Induction: Intravenous  Airway Management Planned: Oral ETT  Additional Equipment:   Intra-op Plan:   Post-operative Plan: Extubation in OR  Informed Consent: I have reviewed the patients History and Physical, chart, labs and discussed the procedure including the risks, benefits and alternatives for the proposed anesthesia with the patient or authorized representative who has indicated his/her understanding and acceptance.   Dental advisory given  Plan Discussed with: CRNA  Anesthesia Plan Comments:         Anesthesia Quick Evaluation

## 2014-04-12 NOTE — Interval H&P Note (Signed)
History and Physical Interval Note:  6/86/1683 7:29 AM  Kelsey Mclaughlin  has presented today for surgery, with the diagnosis of recurrent left breast cancer  The various methods of treatment have been discussed with the patient and family. After consideration of risks, benefits and other options for treatment, the patient has consented to  Procedure(s): MASTECTOMY (Left) LEFT LATISSIMUS DORSI FLAP FOR LEFT BREAST RECONSTRUCTION AND PLACEMENT OF TISSUE EXPANDER (Left) TISSUE EXPANDER (Left) as a surgical intervention .  The patient's history has been reviewed, patient examined, no change in status, stable for surgery.  I have reviewed the patient's chart and labs.  Questions were answered to the patient's satisfaction.     Rickard Kennerly

## 2014-04-12 NOTE — Op Note (Addendum)
Operative Note   DATE OF OPERATION: 7.31.2015  LOCATION: [facility] - inpatient  SURGICAL DIVISION: Plastic Surgery  PREOPERATIVE DIAGNOSES:  Recurrent left breast cancer, history left breast radiation  POSTOPERATIVE DIAGNOSES:  same  PROCEDURE:  1. Left latissimus dorsi muscle flap in breast reconstruction 2. Placement of tissue expander in breast reconstruction  SURGEON: Irene Limbo MD MBA  ASSISTANT: none  ANESTHESIA:  General.   EBL: 101 ml  COMPLICATIONS: None immediate.   INDICATIONS FOR PROCEDURE:  The patient, Kelsey Mclaughlin, is a 53 y.o. female born on May 22, 1961, is here for immediate breast reconstruction. She has a history of prior lumpectomy and radiation. She has completed neoadjuvant chemotherapy for new left breast cancer.   FINDINGS: [place]or Style 9200 350 ml tissue expander, Ref [phone] SN [id number]  Initial fill volume 110 ml  DESCRIPTION OF PROCEDURE:  The patient's operative site was marked with the patient in the preoperative area. The patient was taken to the operating room. SCDs were placed and IV antibiotics were given. The patient's operative site was prepped and draped in a sterile fashion. A time out was performed and all information was confirmed to be correct. Following completion of mastectomy, the breast cavity was irrigated and hemostasis obtained. Dissection completed toward axilla. At this time the wound was packed and covered with Ioban. Drapes were removed and patient was placed into the right lateral decubitus position with all key points padded. She was then prepped and draped in the standard sterile fashion. Skin paddle designed and incised the margins of the paddle. The skin and subcutaneous tissue was elevated off superior surface of latissimus muscle until margins of the muscle were identified. The muscle was then released inferiorly and anteriorly and care was taken not to pick up the serratus anteriorly or the paraspinous muscles  posteriorly. The flap was then raised to the scapula and released and rotated into anterior chest cavity. The back cavity was irrigated and a 19 Fr drain placed and secured to skin with 2-0 nylon. Quilting sutures of 2-0 PDS placed in elevated areas. Closure completed with 3-0 Vicryl in superficial fascia and 3-0 vicryl in dermis, followed by 4-0 Monocryl subcuticular.  Dermabond and steri strips applied.   The thoracodorsal nerve as identified in axilla and divided. The latissimus muscle was then redraped over anterior chest and secured with percutaneous 2-0 prolene sutures. The stay sutures were loosened and a 350 ml tissue expander was prepared. It was soaked in antibiotic solution and placed in submuscular position. A 19 Fr drain was also placed in submuscular position. The stay sutures were then placed on tension to ensure complete muscular coverage of expander. The stay sutures were secured to chest wall with steri strips. The skin paddle was inset with interrupted 4-0 vicryl in dermis and running 4-0 Monocryl subcuticular stitch.  Dermabond, ABDs and a breast binder was applied.    The patient was allowed to wake up and taken to recovery room in stable condition at the end of the case. The family was notified at the end of the case.    SPECIMENS: none  DRAINS:19 Fr in back, 19 Fr in submuscular left chest  Irene Limbo, MD [facility] Plastic & Reconstructive Surgery [phone]

## 2014-04-12 NOTE — Transfer of Care (Signed)
Immediate Anesthesia Transfer of Care Note  Patient: Kelsey Mclaughlin  Procedure(s) Performed: Procedure(s): MASTECTOMY (Left) LEFT LATISSIMUS DORSI FLAP FOR LEFT BREAST RECONSTRUCTION AND PLACEMENT OF TISSUE EXPANDER (Left) TISSUE EXPANDER (Left)  Patient Location: PACU  Anesthesia Type:General  Level of Consciousness: awake, alert  and oriented  Airway & Oxygen Therapy: Patient Spontanous Breathing and Patient connected to nasal cannula oxygen  Post-op Assessment: Report given to PACU RN and Post -op Vital signs reviewed and stable  Post vital signs: Reviewed and stable  Complications: No apparent anesthesia complications

## 2014-04-12 NOTE — Anesthesia Procedure Notes (Addendum)
Anesthesia Regional Block:  Pectoralis block  Pre-Anesthetic Checklist: ,, timeout performed, Correct Patient, Correct Site, Correct Laterality, Correct Procedure, Correct Position, site marked, Risks and benefits discussed, pre-op evaluation, post-op pain management  Laterality: Left  Prep: Maximum Sterile Barrier Precautions used and chloraprep       Needles:  Injection technique: Single-shot  Needle Type: Echogenic Stimulator Needle     Needle Length: 9cm 9 cm Needle Gauge: 21 and 21 G    Additional Needles:  Procedures: ultrasound guided (picture in chart) Pectoralis block Narrative:  Start time: 04/12/2014 7:01 AM End time: 04/12/2014 7:12 AM Injection made incrementally with aspirations every 5 mL. Anesthesiologist: Letica Giaimo,MD  Additional Notes: 2% Lidocaine skin wheel.

## 2014-04-13 LAB — CBC
HEMATOCRIT: 32.7 % — AB (ref 36.0–46.0)
Hemoglobin: 10.5 g/dL — ABNORMAL LOW (ref 12.0–15.0)
MCH: 31.8 pg (ref 26.0–34.0)
MCHC: 32.1 g/dL (ref 30.0–36.0)
MCV: 99.1 fL (ref 78.0–100.0)
Platelets: 195 10*3/uL (ref 150–400)
RBC: 3.3 MIL/uL — ABNORMAL LOW (ref 3.87–5.11)
RDW: 13.7 % (ref 11.5–15.5)
WBC: 10.1 10*3/uL (ref 4.0–10.5)

## 2014-04-13 LAB — BASIC METABOLIC PANEL
Anion gap: 14 (ref 5–15)
BUN: 10 mg/dL (ref 6–23)
CO2: 21 mEq/L (ref 19–32)
Calcium: 8.8 mg/dL (ref 8.4–10.5)
Chloride: 104 mEq/L (ref 96–112)
Creatinine, Ser: 0.52 mg/dL (ref 0.50–1.10)
GFR calc non Af Amer: >90 mL/min (ref >90)
Glucose, Bld: 129 mg/dL — ABNORMAL HIGH (ref 70–99)
Potassium: 4.3 mEq/L (ref 3.7–5.3)
Sodium: 139 mEq/L (ref 137–147)

## 2014-04-13 MED ORDER — HYDROCODONE-ACETAMINOPHEN 10-325 MG PO TABS: 1.0000 | ORAL_TABLET | ORAL | Status: DC | PRN

## 2014-04-13 MED ORDER — SULFAMETHOXAZOLE-TMP DS 800-160 MG PO TABS: 1.0000 | ORAL_TABLET | Freq: Two times a day (BID) | ORAL | Status: DC

## 2014-04-13 MED ORDER — SENNOSIDES-DOCUSATE SODIUM 8.6-50 MG PO TABS: 1.0000 | ORAL_TABLET | Freq: Every day | ORAL | Status: DC

## 2014-04-13 MED ORDER — HYDROCODONE-ACETAMINOPHEN 5-325 MG PO TABS
1.0000 | ORAL_TABLET | ORAL | Status: DC | PRN
  Administered 2014-04-13: 2 via ORAL
  Filled 2014-04-13: qty 2

## 2014-04-13 NOTE — Progress Notes (Signed)
POD# L mastectomy, latissimus flap, placement TE  Temp:  [97.6 F (36.4 C)-98.3 F (36.8 C)] 97.6 F (36.4 C) (08/01 0457) Pulse Rate:  [58-88] 61 (08/01 0457) Resp:  [9-18] 16 (08/01 0457) BP: (114-164)/(48-91) 122/56 mmHg (08/01 0457) SpO2:  [91 %-100 %] 96 % (08/01 0457) Weight:  [83.008 kg (183 lb)] 83.008 kg (183 lb) (08/01 0900)  JP 1 360 JP 2 203 PO>1000  Labs reviewed- Hb 10.5 this am  Minimal pain, has not used oral pain meds  PE Drains old blood Chest and back flat Flap soft, viable  A/P OOB ambulate Ok to shower starting 04/14/14. Pat incisions, steris dry. Breast binder or soft compression bra. F/u arranged with myself in 1 week. Reviewed drain care and will need to empty more than twice daily over next few days. Will switch pain med to hydrocodone which she has taken in past- will try here, plan d/c this pm if tolerates  Irene Limbo, MD [facility] Plastic & Reconstructive Surgery [phone]

## 2014-04-13 NOTE — Progress Notes (Signed)
1 Day Post-Op  Subjective: No complaints  Objective: Vital signs in last 24 hours: Temp:  [97.6 F (36.4 C)-98.3 F (36.8 C)] 97.6 F (36.4 C) (08/01 0457) Pulse Rate:  [58-88] 61 (08/01 0457) Resp:  [9-18] 16 (08/01 0457) BP: (114-164)/(48-91) 122/56 mmHg (08/01 0457) SpO2:  [91 %-100 %] 96 % (08/01 0457) Weight:  [183 lb (83.008 kg)] 183 lb (83.008 kg) (08/01 0900) Last BM Date: 04/11/14  Intake/Output from previous day: 07/31 0701 - 08/01 0700 In: 2940 [P.O.:862; I.V.:2078] Out: 3763 [Urine:3100; Drains:563; Blood:100] Intake/Output this shift: Total I/O In: 240 [P.O.:240] Out: -   Dressings intact Drains serosan  Lab Results:   Recent Labs  04/13/14 0430  WBC 10.1  HGB 10.5*  HCT 32.7*  PLT 195   BMET  Recent Labs  04/13/14 0430  NA 139  K 4.3  CL 104  CO2 21  GLUCOSE 129*  BUN 10  CREATININE 0.52  CALCIUM 8.8   PT/INR No results found for this basename: LABPROT, INR,  in the last 72 hours ABG No results found for this basename: PHART, PCO2, PO2, HCO3,  in the last 72 hours  Studies/Results: No results found.  Anti-infectives: Anti-infectives   Start     Dose/Rate Route Frequency Ordered Stop   04/12/14 1900  ceFAZolin (ANCEF) IVPB 1 g/50 mL premix     1 g 100 mL/hr over 30 Minutes Intravenous 3 times per day 04/12/14 1637 04/14/14 1859   04/12/14 1037  polymyxin B 500,000 Units, bacitracin 50,000 Units in sodium chloride irrigation 0.9 % 500 mL irrigation  Status:  Discontinued       As needed 04/12/14 1048 04/12/14 1150   04/12/14 0600  ceFAZolin (ANCEF) IVPB 2 g/50 mL premix     2 g 100 mL/hr over 30 Minutes Intravenous On call to O.R. 04/11/14 1301 04/12/14 1100      Assessment/Plan: s/p Procedure(s): MASTECTOMY (Left) LEFT LATISSIMUS DORSI FLAP FOR LEFT BREAST RECONSTRUCTION AND PLACEMENT OF TISSUE EXPANDER (Left) TISSUE EXPANDER (Left)  Home today per Plastic Surgery  LOS: 1 day    Blythe Veach A 04/13/2014

## 2014-04-14 NOTE — Discharge Summary (Signed)
Physician Discharge Summary  Patient ID: Kelsey Mclaughlin MRN: [id number] DOB/AGE: 53-Dec-1962 53 y.o.  Admit date: 04/12/2014 Discharge date: 04/14/2014  Admission Diagnoses: left breast cancer  Discharge Diagnoses: same  Active Problems:   Recurrent breast cancer   Discharged Condition: stable  Hospital Course: Patient underwent left mastectomy and immediated reconstruction with tissue expander and latissimus flap. Postoperatively she was maintained on IV antibiotics. Patient was able to tolerate diet and had minimal pain on POD#1. She was able to ambulate independently. She and spouse instructed on drain and wound care.    Disposition: 01-Home or Self Care  Discharge Instructions   Call MD for:  redness, tenderness, or signs of infection (pain, swelling, bleeding, redness, odor or green/yellow discharge around incision site)    Complete by:  As directed      Call MD for:  severe or increased pain, loss or decreased feeling  in affected limb(s)    Complete by:  As directed      Discharge instructions    Complete by:  As directed   Ok to shower starting am 04/14/2014. Pat incisions dry and steri strips dry.  Dry dressing as needed over incisions. Breast binder all times.  Strip and record JP drain at least twice daily, may need to do more frequently.     Driving Restrictions    Complete by:  As directed   No driving while taking narcotics     Lifting restrictions    Complete by:  As directed   No lifting greater than 5-10 lbs     Resume previous diet    Complete by:  As directed             Medication List    STOP taking these medications       lidocaine-prilocaine cream  Commonly known as:  EMLA      TAKE these medications       HYDROcodone-acetaminophen 10-325 MG per tablet  Commonly known as:  NORCO  Take 1 tablet by mouth every 4 (four) hours as needed.     senna-docusate 8.6-50 MG per tablet  Commonly known as:  Senokot-S  Take 1 tablet by mouth daily.      sulfamethoxazole-trimethoprim 800-160 MG per tablet  Commonly known as:  BACTRIM DS  Take 1 tablet by mouth 2 (two) times daily.           Follow-up Information   Follow up with Maia Petties., MD. Schedule an appointment as soon as possible for a visit in 2 weeks.   Specialty:  General Surgery   Contact information:   [address] [place] [address] [phone]       Follow up with Irene Limbo, MD In 1 week. (as scheduled)    Specialty:  Plastic Surgery   Contact information:   [place] [address] [phone]       Signed: Irene Limbo 04/14/2014, 9:33 AM

## 2014-04-15 ENCOUNTER — Encounter (HOSPITAL_COMMUNITY): Payer: Self-pay | Admitting: Surgery

## 2014-04-17 ENCOUNTER — Telehealth: Payer: Self-pay | Admitting: Oncology

## 2014-04-17 ENCOUNTER — Telehealth: Payer: Self-pay | Admitting: *Deleted

## 2014-04-17 NOTE — Telephone Encounter (Signed)
, °

## 2014-04-17 NOTE — Telephone Encounter (Signed)
Per [facility] staff phone call scheduled appts. Advised schedulers 

## 2014-04-29 ENCOUNTER — Other Ambulatory Visit: Payer: Federal, State, Local not specified - PPO

## 2014-04-29 ENCOUNTER — Ambulatory Visit: Payer: Federal, State, Local not specified - PPO

## 2014-05-01 ENCOUNTER — Ambulatory Visit (HOSPITAL_BASED_OUTPATIENT_CLINIC_OR_DEPARTMENT_OTHER): Payer: Federal, State, Local not specified - PPO

## 2014-05-01 ENCOUNTER — Other Ambulatory Visit (HOSPITAL_BASED_OUTPATIENT_CLINIC_OR_DEPARTMENT_OTHER): Payer: Federal, State, Local not specified - PPO

## 2014-05-01 ENCOUNTER — Telehealth: Payer: Self-pay | Admitting: Oncology

## 2014-05-01 ENCOUNTER — Telehealth: Payer: Self-pay | Admitting: *Deleted

## 2014-05-01 ENCOUNTER — Other Ambulatory Visit: Payer: Self-pay | Admitting: Medical Oncology

## 2014-05-01 ENCOUNTER — Ambulatory Visit (HOSPITAL_BASED_OUTPATIENT_CLINIC_OR_DEPARTMENT_OTHER): Payer: Federal, State, Local not specified - PPO | Admitting: Oncology

## 2014-05-01 DIAGNOSIS — C50412 Malignant neoplasm of upper-outer quadrant of left female breast: Secondary | ICD-10-CM

## 2014-05-01 DIAGNOSIS — C50419 Malignant neoplasm of upper-outer quadrant of unspecified female breast: Secondary | ICD-10-CM

## 2014-05-01 DIAGNOSIS — C50919 Malignant neoplasm of unspecified site of unspecified female breast: Secondary | ICD-10-CM

## 2014-05-01 DIAGNOSIS — Z853 Personal history of malignant neoplasm of breast: Secondary | ICD-10-CM

## 2014-05-01 DIAGNOSIS — K760 Fatty (change of) liver, not elsewhere classified: Secondary | ICD-10-CM

## 2014-05-01 DIAGNOSIS — Z5112 Encounter for antineoplastic immunotherapy: Secondary | ICD-10-CM

## 2014-05-01 DIAGNOSIS — C50912 Malignant neoplasm of unspecified site of left female breast: Secondary | ICD-10-CM

## 2014-05-01 DIAGNOSIS — Z170 Estrogen receptor positive status [ER+]: Secondary | ICD-10-CM

## 2014-05-01 LAB — COMPREHENSIVE METABOLIC PANEL (CC13)
ALK PHOS: 77 U/L (ref 40–150)
ALT: 48 U/L (ref 0–55)
AST: 42 U/L — ABNORMAL HIGH (ref 5–34)
Albumin: 3.4 g/dL — ABNORMAL LOW (ref 3.5–5.0)
Anion Gap: 10 mEq/L (ref 3–11)
BILIRUBIN TOTAL: 0.22 mg/dL (ref 0.20–1.20)
BUN: 6.7 mg/dL — ABNORMAL LOW (ref 7.0–26.0)
CO2: 27 mEq/L (ref 22–29)
CREATININE: 0.7 mg/dL (ref 0.6–1.1)
Calcium: 9.5 mg/dL (ref 8.4–10.4)
Chloride: 105 mEq/L (ref 98–109)
Glucose: 99 mg/dl (ref 70–140)
Potassium: 4.2 mEq/L (ref 3.5–5.1)
SODIUM: 142 meq/L (ref 136–145)
TOTAL PROTEIN: 6 g/dL — AB (ref 6.4–8.3)

## 2014-05-01 LAB — CBC WITH DIFFERENTIAL/PLATELET
BASO%: 0.6 % (ref 0.0–2.0)
Basophils Absolute: 0 10*3/uL (ref 0.0–0.1)
EOS%: 10.4 % — AB (ref 0.0–7.0)
Eosinophils Absolute: 0.6 10*3/uL — ABNORMAL HIGH (ref 0.0–0.5)
HCT: 35.6 % (ref 34.8–46.6)
HGB: 11.6 g/dL (ref 11.6–15.9)
LYMPH%: 20.2 % (ref 14.0–49.7)
MCH: 30.8 pg (ref 25.1–34.0)
MCHC: 32.6 g/dL (ref 31.5–36.0)
MCV: 94.6 fL (ref 79.5–101.0)
MONO#: 0.7 10*3/uL (ref 0.1–0.9)
MONO%: 13.1 % (ref 0.0–14.0)
NEUT#: 3 10*3/uL (ref 1.5–6.5)
NEUT%: 55.7 % (ref 38.4–76.8)
PLATELETS: 312 10*3/uL (ref 145–400)
RBC: 3.77 10*6/uL (ref 3.70–5.45)
RDW: 13.6 % (ref 11.2–14.5)
WBC: 5.3 10*3/uL (ref 3.9–10.3)
lymph#: 1.1 10*3/uL (ref 0.9–3.3)

## 2014-05-01 MED ORDER — DIPHENHYDRAMINE HCL 25 MG PO CAPS
ORAL_CAPSULE | ORAL | Status: AC
  Filled 2014-05-01: qty 2

## 2014-05-01 MED ORDER — SODIUM CHLORIDE 0.9 % IV SOLN
Freq: Once | INTRAVENOUS | Status: AC
  Administered 2014-05-01: 10:00:00 via INTRAVENOUS

## 2014-05-01 MED ORDER — HEPARIN SOD (PORK) LOCK FLUSH 100 UNIT/ML IV SOLN
500.0000 [IU] | Freq: Once | INTRAVENOUS | Status: AC | PRN
  Administered 2014-05-01: 500 [IU]
  Filled 2014-05-01: qty 5

## 2014-05-01 MED ORDER — TRASTUZUMAB CHEMO INJECTION 440 MG
6.0000 mg/kg | Freq: Once | INTRAVENOUS | Status: AC
  Administered 2014-05-01: 504 mg via INTRAVENOUS
  Filled 2014-05-01: qty 24

## 2014-05-01 MED ORDER — ACETAMINOPHEN 325 MG PO TABS
ORAL_TABLET | ORAL | Status: AC
  Filled 2014-05-01: qty 2

## 2014-05-01 MED ORDER — ACETAMINOPHEN 325 MG PO TABS
650.0000 mg | ORAL_TABLET | Freq: Once | ORAL | Status: AC
  Administered 2014-05-01: 650 mg via ORAL

## 2014-05-01 MED ORDER — DIPHENHYDRAMINE HCL 25 MG PO CAPS
25.0000 mg | ORAL_CAPSULE | Freq: Once | ORAL | Status: AC
  Administered 2014-05-01: 25 mg via ORAL

## 2014-05-01 MED ORDER — SODIUM CHLORIDE 0.9 % IJ SOLN
10.0000 mL | INTRAMUSCULAR | Status: DC | PRN
  Administered 2014-05-01: 10 mL
  Filled 2014-05-01: qty 10

## 2014-05-01 NOTE — Telephone Encounter (Signed)
, °

## 2014-05-01 NOTE — Patient Instructions (Signed)
[facility] Discharge Instructions for Patients Receiving Chemotherapy  Today you received the following chemotherapy agents Herceptin  To help prevent nausea and vomiting after your treatment, we encourage you to take your nausea medication     If you develop nausea and vomiting that is not controlled by your nausea medication, call the clinic.   BELOW ARE SYMPTOMS THAT SHOULD BE REPORTED IMMEDIATELY:  *FEVER GREATER THAN 100.5 F  *CHILLS WITH OR WITHOUT FEVER  NAUSEA AND VOMITING THAT IS NOT CONTROLLED WITH YOUR NAUSEA MEDICATION  *UNUSUAL SHORTNESS OF BREATH  *UNUSUAL BRUISING OR BLEEDING  TENDERNESS IN MOUTH AND THROAT WITH OR WITHOUT PRESENCE OF ULCERS  *URINARY PROBLEMS  *BOWEL PROBLEMS  UNUSUAL RASH Items with * indicate a potential emergency and should be followed up as soon as possible.  Feel free to call the clinic you have any questions or concerns. The clinic phone number is [phone].    

## 2014-05-01 NOTE — Telephone Encounter (Signed)
Per staff message and P[facility] I have scheduled appts. Advised scheduler of appts. JMW  

## 2014-05-01 NOTE — Progress Notes (Signed)
[place]  [phone] [phone]     ID: Kelsey Mclaughlin OB: 04-22-1961  MR#: [id number]  WUG#:[id number]  PCP: Default, Provider, MD GYN:   SU: Kelsey Mesa MD OTHER MD: Kelsey Limbo MD, Kelsey Grant MD  CHIEF COMPLAINT: Breast cancer, recurrent, estrogen receptor positive CURRENT TREATMENT: anti HER-2 immunotherapy; to start anti-estrogen therapy  BREAST CANCER HISTORY: From doctor Kelsey Mclaughlin's intake note [id number]:  "Kelsey Mclaughlin is a 53 y.o. female. In 1998 at the age of 48 was diagnosed with left breast cancer in [place]. This was an invasive carcinoma grade 2 stage II. At that time she underwent a lumpectomy with excellent lymph node dissection. She received 6 months of chemotherapy. [name] She also had radiation therapy to the left breast. 2011 patient noticed a lump in the outside of her left breast. She had ultrasound workup performed and was told it was negative no biopsies were performed. General 2014 after patient moved to the [place] she had a mammogram performed this revealed a possible mass in the outer quadrant of the left breast. Ultrasound showed it to be 1.7 cm. MRI of the breasts performed on January 30 revealed the mass to be 1.5 x 2.2 x 1.5 cm. Because of this she also had a biopsy performed on 10/04/2013. The biopsy revealed [[id number]) an invasive ductal carcinoma with ductal carcinoma in situ grade 2. Prognostic panel was positive for estrogen receptor 100% megestrol receptor +33% proliferation marker Ki-67 15% and the tumor was HER-2/neu positive" [with a signals ratio of 3.1 and number per cell 6.3]  The patient's subsequent history is as detailed below  INTERVAL HISTORY: [name] returns today for followup of her breast cancer accompanied by her spouse Kelsey Mclaughlin. Since her last visit here she underwent left simple mastectomy, with the  pathology ([id number]) showing 2 residual foci of invasive ductal carcinoma measuring 1.3 cm and 1.0 cm, both morphologically identical, showing extracellular mucin, grade 2, with negative and ample margins. Her to was negative, with a signals ratio of 1.67 and a number per cell 3.25. Estrogen receptor was 100% positive, and progesterone receptor and 19% positive, both with strong staining intensity. The patient underwent immediate reconstruction with a tissue expander and a left latissimus flap. She tolerated the surgery "great", although she still has pain, with 2 drain still in place. She is taking oxycodone every 4-6 hours, with some constipation as the main side effect.  REVIEW OF SYSTEMS: Otherwise [name] is tolerating the trastuzumab with no side effects it she is aware of. A detailed review of systems today was noncontributory  PAST MEDICAL HISTORY: Past Medical History  Diagnosis Date  . Cancer 1998, 2015    breast  . Bronchitis     completed antibiotics 10/21/13/ states resolved  . Stroke 2004    TIA-  no problems since  . Hypertension     no meds  . Lymph edema     lt arm    PAST SURGICAL HISTORY: Past Surgical History  Procedure Laterality Date  . Breast surgery  1998    lumpectomy on left and removed lymphnodes in [place]  . Portacath placement Right 10/25/2013    Procedure: INSERTION PORT-A-CATH;  Surgeon: Imogene Burn. Tsuei, MD;  Location: [facility];  Service: General;  Laterality: Right;  . Simple mastectomy with axillary sentinel node biopsy Left 04/12/2014    Procedure: MASTECTOMY;  Surgeon: Imogene Burn. Tsuei, MD;  Location: [facility];  Service: General;  Laterality: Left;  . Latissimus flap to breast Left 04/12/2014    Procedure: LEFT LATISSIMUS DORSI FLAP FOR LEFT BREAST RECONSTRUCTION AND PLACEMENT OF TISSUE EXPANDER;  Surgeon: Kelsey Limbo, MD;  Location: [facility];  Service: Plastics;  Laterality: Left;  . Tissue expander placement Left 04/12/2014    Procedure: TISSUE EXPANDER;   Surgeon: Kelsey Limbo, MD;  Location: [facility];  Service: Plastics;  Laterality: Left;    FAMILY HISTORY Family History  Problem Relation Age of Onset  . Breast cancer Mother 27  . Heart disease Father   . Heart attack Father   . Breast cancer Maternal Grandmother 65  . Brain cancer Maternal Uncle 74    benign brain tumor  . Heart attack Paternal Aunt     GYNECOLOGIC HISTORY:   menarche age 8, first live birth age 18. The patient is GX P2. She went through the change of life approximately 2011.   SOCIAL HISTORY:  The patient worked previously as a Marine scientist. She has 2 children both of whom live in [place], works for the [employer], and [employer], a Librarian, academic. They are in their early 22s. There are no grandchildren. The patient married Kelsey Mclaughlin (both women are originally from [place]) in [place]. [place] with 5 dogs, 10 chickens and 2 courses. They're not church attenders    ADVANCED DIRECTIVES:    HEALTH MAINTENANCE: History  Substance Use Topics  . Smoking status: Never Smoker   . Smokeless tobacco: Never Used  . Alcohol Use: Yes     Comment: 2 drinks/day     Colonoscopy:  PAP:  Bone density:  Lipid panel:  No Known Allergies  Current Outpatient Prescriptions  Medication Sig Dispense Refill  . HYDROcodone-acetaminophen (NORCO) 10-325 MG per tablet Take 1 tablet by mouth every 4 (four) hours as needed.  50 tablet  0  . senna-docusate (SENOKOT-S) 8.6-50 MG per tablet Take 1 tablet by mouth daily.  20 tablet  0  . sulfamethoxazole-trimethoprim (BACTRIM DS) 800-160 MG per tablet Take 1 tablet by mouth 2 (two) times daily.  12 tablet  0   No current facility-administered medications for this visit.   Facility-Administered Medications Ordered in Other Visits  Medication Dose Route Frequency Provider Last Rate Last Dose  . sodium chloride 0.9 % injection 10 mL  10 mL Intracatheter PRN [name], MD   10 mL at  05/01/14 1038    OBJECTIVE: middle-aged white woman in no acute distress  There were no vitals filed for this visit.   There is no weight on file to calculate BMI.      ECOG FS:1 - Symptomatic but completely ambulatory  Ocular: Sclerae unicteric Ear-nose-throat: Oropharynx clear and moist Lymphatic: No cervical or supraclavicular adenopathy Lungs no rales or rhonchi Heart regular rate and rhythm Abd soft, nontender, positive bowel sounds MSK no focal spinal tenderness,  grade 1 left upper extremity lymphedema  Neuro: non-focal, well-oriented, positive affect Breasts: The right breast is unremarkable. The left breast is status post mastectomy with expander in place and latissimus flap reconstruction. The cosmetic result is good. There are 2 drains still in place. There is approximately 15 and 20 cc of serosanguineous fluid in the bulbs. The left axilla is benign. The left posterior flank incision is healing nicely  LAB RESULTS:  CMP     Component Value Date/Time   NA 142 05/01/2014 0936   NA 139 04/13/2014  0430   K 4.2 05/01/2014 0936   K 4.3 04/13/2014 0430   CL 104 04/13/2014 0430   CO2 27 05/01/2014 0936   CO2 21 04/13/2014 0430   GLUCOSE 99 05/01/2014 0936   GLUCOSE 129* 04/13/2014 0430   BUN 6.7* 05/01/2014 0936   BUN 10 04/13/2014 0430   CREATININE 0.7 05/01/2014 0936   CREATININE 0.52 04/13/2014 0430   CALCIUM 9.5 05/01/2014 0936   CALCIUM 8.8 04/13/2014 0430   PROT 6.0* 05/01/2014 0936   ALBUMIN 3.4* 05/01/2014 0936   AST 42* 05/01/2014 0936   ALT 48 05/01/2014 0936   ALKPHOS 77 05/01/2014 0936   BILITOT 0.22 05/01/2014 0936   GFRNONAA >90 04/13/2014 0430   GFRAA >90 04/13/2014 0430    I No results found for this basename: SPEP,  UPEP,   kappa and lambda light chains    Lab Results  Component Value Date   WBC 5.3 05/01/2014   NEUTROABS 3.0 05/01/2014   HGB 11.6 05/01/2014   HCT 35.6 05/01/2014   MCV 94.6 05/01/2014   PLT 312 05/01/2014      Chemistry      Component Value Date/Time    NA 142 05/01/2014 0936   NA 139 04/13/2014 0430   K 4.2 05/01/2014 0936   K 4.3 04/13/2014 0430   CL 104 04/13/2014 0430   CO2 27 05/01/2014 0936   CO2 21 04/13/2014 0430   BUN 6.7* 05/01/2014 0936   BUN 10 04/13/2014 0430   CREATININE 0.7 05/01/2014 0936   CREATININE 0.52 04/13/2014 0430      Component Value Date/Time   CALCIUM 9.5 05/01/2014 0936   CALCIUM 8.8 04/13/2014 0430   ALKPHOS 77 05/01/2014 0936   AST 42* 05/01/2014 0936   ALT 48 05/01/2014 0936   BILITOT 0.22 05/01/2014 0936       No results found for this basename: LABCA2    No components found with this basename: LABCA125    No results found for this basename: INR,  in the last 168 hours  Urinalysis    Component Value Date/Time   LABSPEC 1.005 01/01/2014 0947   GLUCOSEU Negative 01/01/2014 0947   UROBILINOGEN 0.2 01/01/2014 0947    STUDIES: No results found.  ASSESSMENT: 53 y.o. BRCA negative [place] woman, [place] speaker   (1) status post left lumpectomy and axillary lymph node dissection in 1998 in [place] for a stage II invasive ductal carcinoma, grade 2, treated with adjuvant chemotherapy (specific drugs not clear) and adjuvant radiation.  (2) status post left breast upper outer quadrant biopsy 10/04/2013 for a clinically mT2 N0, stage IIA invasive ductal carcinoma, grade 2, estrogen receptor 100% positive, progesterone receptor 33% positive, with an MIB-1 of 15%, and HER-2 amplified; staging studies showed no evidence of metastatic disease  (3) started on neoadjuvant carboplatin/ docetaxel/ trastuzumab/ pertuzumab 11/12/2013  (a) completed 4 cycles as of 05/06/201, after which docetaxel was discontinued because of neuropathy symptoms   (b) gemcitabine was given day 1 and day 8 with carboplatin day 1 in the final 2 cycles, completed 03/04/2014  (4) left mastectomy  04/12/2014 showed residual mpT1c pNX invasive ductal carcinoma, grade 2, estrogen and progesterone receptor positive, HER-2 negative, with negative  margins  (5) aromatase inhibitors to follow surgery  (6) 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    PLAN: Denora did well with the surgery. It appears that we cleared the HER-2 positive portion of the tumor. What was left behind is  the less aggressive, estrogen receptor positive portion. This will be dealt with with aromatase inhibitors and we will start these in approximately 6 weeks, after she has sufficiently recovered from her mastectomy.  Before that visit she will have a repeat echocardiogram and a baseline bone density.  Christna has a good understanding of the overall plan. She agrees with it. She knows the goal of treatment in her case is cure. She will call with any problems that may develop before her next visit here.  [name], MD   05/01/2014 12:21 PM

## 2014-05-03 ENCOUNTER — Other Ambulatory Visit: Payer: Self-pay | Admitting: *Deleted

## 2014-05-03 DIAGNOSIS — C50412 Malignant neoplasm of upper-outer quadrant of left female breast: Secondary | ICD-10-CM

## 2014-05-06 ENCOUNTER — Ambulatory Visit (INDEPENDENT_AMBULATORY_CARE_PROVIDER_SITE_OTHER): Payer: Federal, State, Local not specified - PPO | Admitting: Surgery

## 2014-05-06 ENCOUNTER — Encounter (INDEPENDENT_AMBULATORY_CARE_PROVIDER_SITE_OTHER): Payer: Self-pay | Admitting: Surgery

## 2014-05-06 VITALS — BP 140/96 | HR 64 | Temp 98.2°F | Ht 67.0 in | Wt 175.1 lb

## 2014-05-06 DIAGNOSIS — C50412 Malignant neoplasm of upper-outer quadrant of left female breast: Secondary | ICD-10-CM

## 2014-05-06 DIAGNOSIS — C50419 Malignant neoplasm of upper-outer quadrant of unspecified female breast: Secondary | ICD-10-CM

## 2014-05-06 NOTE — Progress Notes (Signed)
Status post left mastectomy with latissimus flap and tissue expander reconstruction. This was performed on 04/12/14. The patient still has one drain left in place. She has some pain which is located mostly in her back where the latissimus flap was harvested. The pathology showed 2 residual areas of invasive ductal carcinoma, one measuring 1.3 cm and 1.0 cm. She is going to start an aromatase inhibitor in a few weeks. She is scheduled to see plastic surgery next week for drain removal.  Her incisions are well-healed with no sign of infection. No flap necrosis. No seroma noted. Her port site looks fine. She still has residual left arm swelling but this was pre-existing prior to the most recent surgery.  We will see her back in 6 months or sooner PRN.  I can remove her port whenever Dr. Jana Hakim thinks it is appropriate.  Kelsey Burn. Georgette Dover, MD, [facility]  General/ Trauma Surgery  05/06/2014 9:21 AM

## 2014-05-22 ENCOUNTER — Ambulatory Visit: Payer: Federal, State, Local not specified - PPO

## 2014-05-22 ENCOUNTER — Encounter (HOSPITAL_COMMUNITY): Payer: Federal, State, Local not specified - PPO | Admitting: Anesthesiology

## 2014-05-22 ENCOUNTER — Observation Stay (HOSPITAL_COMMUNITY)
Admission: RE | Admit: 2014-05-22 | Discharge: 2014-05-23 | Disposition: A | Discharge status: Home / Self Care | Payer: Federal, State, Local not specified - PPO | Source: Ambulatory Visit | Attending: Plastic Surgery | Admitting: Plastic Surgery

## 2014-05-22 ENCOUNTER — Other Ambulatory Visit: Payer: Self-pay | Admitting: Plastic Surgery

## 2014-05-22 ENCOUNTER — Other Ambulatory Visit (HOSPITAL_BASED_OUTPATIENT_CLINIC_OR_DEPARTMENT_OTHER): Payer: Federal, State, Local not specified - PPO

## 2014-05-22 ENCOUNTER — Encounter (HOSPITAL_COMMUNITY): Payer: Self-pay | Admitting: *Deleted

## 2014-05-22 ENCOUNTER — Ambulatory Visit (HOSPITAL_BASED_OUTPATIENT_CLINIC_OR_DEPARTMENT_OTHER): Payer: Federal, State, Local not specified - PPO

## 2014-05-22 ENCOUNTER — Encounter (HOSPITAL_COMMUNITY)
Admission: RE | Disposition: A | Discharge status: Home / Self Care | Payer: Self-pay | Source: Ambulatory Visit | Attending: Plastic Surgery

## 2014-05-22 ENCOUNTER — Ambulatory Visit (HOSPITAL_COMMUNITY): Payer: Federal, State, Local not specified - PPO | Admitting: Anesthesiology

## 2014-05-22 VITALS — BP 117/69 | HR 85 | Temp 98.8°F | Resp 18

## 2014-05-22 DIAGNOSIS — IMO0002 Reserved for concepts with insufficient information to code with codable children: Principal | ICD-10-CM | POA: Insufficient documentation

## 2014-05-22 DIAGNOSIS — Z9010 Acquired absence of unspecified breast and nipple: Secondary | ICD-10-CM | POA: Insufficient documentation

## 2014-05-22 DIAGNOSIS — Z5112 Encounter for antineoplastic immunotherapy: Secondary | ICD-10-CM

## 2014-05-22 DIAGNOSIS — Z853 Personal history of malignant neoplasm of breast: Secondary | ICD-10-CM

## 2014-05-22 DIAGNOSIS — C50419 Malignant neoplasm of upper-outer quadrant of unspecified female breast: Secondary | ICD-10-CM

## 2014-05-22 DIAGNOSIS — Y836 Removal of other organ (partial) (total) as the cause of abnormal reaction of the patient, or of later complication, without mention of misadventure at the time of the procedure: Secondary | ICD-10-CM | POA: Insufficient documentation

## 2014-05-22 DIAGNOSIS — Z923 Personal history of irradiation: Secondary | ICD-10-CM | POA: Diagnosis not present

## 2014-05-22 DIAGNOSIS — C50412 Malignant neoplasm of upper-outer quadrant of left female breast: Secondary | ICD-10-CM

## 2014-05-22 DIAGNOSIS — Z95828 Presence of other vascular implants and grafts: Secondary | ICD-10-CM

## 2014-05-22 HISTORY — PX: TISSUE EXPANDER PLACEMENT: SHX2530

## 2014-05-22 LAB — COMPREHENSIVE METABOLIC PANEL (CC13)
ALT: 40 U/L (ref 0–55)
ANION GAP: 11 meq/L (ref 3–11)
AST: 19 U/L (ref 5–34)
Albumin: 3.2 g/dL — ABNORMAL LOW (ref 3.5–5.0)
Alkaline Phosphatase: 73 U/L (ref 40–150)
BUN: 6.9 mg/dL — ABNORMAL LOW (ref 7.0–26.0)
CALCIUM: 9.6 mg/dL (ref 8.4–10.4)
CHLORIDE: 101 meq/L (ref 98–109)
CO2: 23 meq/L (ref 22–29)
CREATININE: 0.7 mg/dL (ref 0.6–1.1)
Glucose: 136 mg/dl (ref 70–140)
Potassium: 3.5 mEq/L (ref 3.5–5.1)
Sodium: 135 mEq/L — ABNORMAL LOW (ref 136–145)
Total Bilirubin: 0.27 mg/dL (ref 0.20–1.20)
Total Protein: 7.1 g/dL (ref 6.4–8.3)

## 2014-05-22 LAB — URINALYSIS, MICROSCOPIC - CHCC
Bilirubin (Urine): NEGATIVE
Glucose: NEGATIVE mg/dL
KETONES: NEGATIVE mg/dL
Leukocyte Esterase: NEGATIVE
Nitrite: NEGATIVE
PH: 6.5 (ref 4.6–8.0)
Protein: <30 mg/dL
SPECIFIC GRAVITY, URINE: 1.005 (ref 1.003–1.035)
UROBILINOGEN UR: 0.2 mg/dL (ref 0.2–1)

## 2014-05-22 LAB — CBC WITH DIFFERENTIAL/PLATELET
BASO%: 0.1 % (ref 0.0–2.0)
Basophils Absolute: 0 10*3/uL (ref 0.0–0.1)
EOS%: 0.4 % (ref 0.0–7.0)
Eosinophils Absolute: 0.1 10*3/uL (ref 0.0–0.5)
HCT: 37 % (ref 34.8–46.6)
HEMOGLOBIN: 12 g/dL (ref 11.6–15.9)
LYMPH%: 6.6 % — AB (ref 14.0–49.7)
MCH: 29.7 pg (ref 25.1–34.0)
MCHC: 32.4 g/dL (ref 31.5–36.0)
MCV: 91.6 fL (ref 79.5–101.0)
MONO#: 1.4 10*3/uL — AB (ref 0.1–0.9)
MONO%: 9 % (ref 0.0–14.0)
NEUT#: 12.8 10*3/uL — ABNORMAL HIGH (ref 1.5–6.5)
NEUT%: 83.9 % — AB (ref 38.4–76.8)
PLATELETS: 255 10*3/uL (ref 145–400)
RBC: 4.04 10*6/uL (ref 3.70–5.45)
RDW: 13 % (ref 11.2–14.5)
WBC: 15.2 10*3/uL — ABNORMAL HIGH (ref 3.9–10.3)
lymph#: 1 10*3/uL (ref 0.9–3.3)

## 2014-05-22 SURGERY — INSERTION, TISSUE EXPANDER
Anesthesia: General | Site: Breast | Laterality: Left

## 2014-05-22 MED ORDER — ONDANSETRON HCL 4 MG/2ML IJ SOLN
INTRAMUSCULAR | Status: DC | PRN
  Administered 2014-05-22: 4 mg via INTRAVENOUS

## 2014-05-22 MED ORDER — SULFAMETHOXAZOLE-TMP DS 800-160 MG PO TABS: 1.0000 | ORAL_TABLET | Freq: Two times a day (BID) | ORAL | Status: DC

## 2014-05-22 MED ORDER — HYDROMORPHONE HCL PF 1 MG/ML IJ SOLN
INTRAMUSCULAR | Status: AC
  Filled 2014-05-22: qty 1

## 2014-05-22 MED ORDER — CEFAZOLIN SODIUM-DEXTROSE 2-3 GM-% IV SOLR
2.0000 g | INTRAVENOUS | Status: AC
  Administered 2014-05-22: 2 g via INTRAVENOUS
  Filled 2014-05-22: qty 50

## 2014-05-22 MED ORDER — NEOSTIGMINE METHYLSULFATE 10 MG/10ML IV SOLN
INTRAVENOUS | Status: AC
  Filled 2014-05-22: qty 1

## 2014-05-22 MED ORDER — SUCCINYLCHOLINE CHLORIDE 20 MG/ML IJ SOLN
INTRAMUSCULAR | Status: AC
  Filled 2014-05-22: qty 1

## 2014-05-22 MED ORDER — ACETAMINOPHEN 325 MG PO TABS
ORAL_TABLET | ORAL | Status: AC
  Filled 2014-05-22: qty 2

## 2014-05-22 MED ORDER — HYDROCODONE-ACETAMINOPHEN 10-325 MG PO TABS: 1.0000 | ORAL_TABLET | ORAL | Status: DC | PRN

## 2014-05-22 MED ORDER — LACTATED RINGERS IV SOLN
INTRAVENOUS | Status: DC | PRN
  Administered 2014-05-22 (×2): via INTRAVENOUS

## 2014-05-22 MED ORDER — PROMETHAZINE HCL 25 MG/ML IJ SOLN: 6.2500 mg | INTRAMUSCULAR | Status: DC | PRN

## 2014-05-22 MED ORDER — GLYCOPYRROLATE 0.2 MG/ML IJ SOLN
INTRAMUSCULAR | Status: DC | PRN
  Administered 2014-05-22: 0.4 mg via INTRAVENOUS

## 2014-05-22 MED ORDER — MEPERIDINE HCL 25 MG/ML IJ SOLN: 6.2500 mg | INTRAMUSCULAR | Status: DC | PRN

## 2014-05-22 MED ORDER — KETOROLAC TROMETHAMINE 15 MG/ML IJ SOLN
15.0000 mg | Freq: Three times a day (TID) | INTRAMUSCULAR | Status: DC
  Administered 2014-05-22 – 2014-05-23 (×2): 15 mg via INTRAVENOUS
  Filled 2014-05-22 (×4): qty 1

## 2014-05-22 MED ORDER — LACTATED RINGERS IV SOLN
INTRAVENOUS | Status: DC
  Administered 2014-05-22: 50 mL via INTRAVENOUS

## 2014-05-22 MED ORDER — MIDAZOLAM HCL 5 MG/5ML IJ SOLN
INTRAMUSCULAR | Status: DC | PRN
  Administered 2014-05-22: 2 mg via INTRAVENOUS

## 2014-05-22 MED ORDER — GLYCOPYRROLATE 0.2 MG/ML IJ SOLN
INTRAMUSCULAR | Status: AC
  Filled 2014-05-22: qty 2

## 2014-05-22 MED ORDER — HYDROMORPHONE HCL PF 1 MG/ML IJ SOLN
0.2500 mg | INTRAMUSCULAR | Status: DC | PRN
  Administered 2014-05-22: 0.5 mg via INTRAVENOUS

## 2014-05-22 MED ORDER — LIDOCAINE HCL (CARDIAC) 20 MG/ML IV SOLN
INTRAVENOUS | Status: DC | PRN
  Administered 2014-05-22: 50 mg via INTRAVENOUS

## 2014-05-22 MED ORDER — POTASSIUM CHLORIDE IN NACL 20-0.45 MEQ/L-% IV SOLN
INTRAVENOUS | Status: DC
  Administered 2014-05-22 – 2014-05-23 (×2): via INTRAVENOUS
  Filled 2014-05-22 (×3): qty 1000

## 2014-05-22 MED ORDER — ONDANSETRON HCL 4 MG PO TABS: 4.0000 mg | ORAL_TABLET | Freq: Four times a day (QID) | ORAL | Status: DC | PRN

## 2014-05-22 MED ORDER — SULFAMETHOXAZOLE-TMP DS 800-160 MG PO TABS
1.0000 | ORAL_TABLET | Freq: Two times a day (BID) | ORAL | Status: DC
Start: 2014-05-22 — End: 2014-08-29

## 2014-05-22 MED ORDER — KETOROLAC TROMETHAMINE 15 MG/ML IJ SOLN
INTRAMUSCULAR | Status: AC
  Filled 2014-05-22: qty 1

## 2014-05-22 MED ORDER — LIDOCAINE HCL (CARDIAC) 20 MG/ML IV SOLN
INTRAVENOUS | Status: AC
  Filled 2014-05-22: qty 5

## 2014-05-22 MED ORDER — NEOSTIGMINE METHYLSULFATE 10 MG/10ML IV SOLN
INTRAVENOUS | Status: DC | PRN
  Administered 2014-05-22: 3 mg via INTRAVENOUS

## 2014-05-22 MED ORDER — PROPOFOL 10 MG/ML IV BOLUS
INTRAVENOUS | Status: DC | PRN
  Administered 2014-05-22: 200 mg via INTRAVENOUS

## 2014-05-22 MED ORDER — HYDROMORPHONE HCL PF 1 MG/ML IJ SOLN
0.5000 mg | INTRAMUSCULAR | Status: DC | PRN
  Administered 2014-05-23: 1 mg via INTRAVENOUS
  Filled 2014-05-22: qty 1

## 2014-05-22 MED ORDER — 0.9 % SODIUM CHLORIDE (POUR BTL) OPTIME
TOPICAL | Status: DC | PRN
  Administered 2014-05-22: 1000 mL

## 2014-05-22 MED ORDER — ONDANSETRON HCL 4 MG/2ML IJ SOLN: 4.0000 mg | Freq: Four times a day (QID) | INTRAMUSCULAR | Status: DC | PRN

## 2014-05-22 MED ORDER — FENTANYL CITRATE 0.05 MG/ML IJ SOLN
INTRAMUSCULAR | Status: AC
  Filled 2014-05-22: qty 5

## 2014-05-22 MED ORDER — TRASTUZUMAB CHEMO INJECTION 440 MG
6.0000 mg/kg | Freq: Once | INTRAVENOUS | Status: AC
  Administered 2014-05-22: 504 mg via INTRAVENOUS
  Filled 2014-05-22: qty 24

## 2014-05-22 MED ORDER — ONDANSETRON HCL 4 MG/2ML IJ SOLN
INTRAMUSCULAR | Status: AC
  Filled 2014-05-22: qty 2

## 2014-05-22 MED ORDER — FENTANYL CITRATE 0.05 MG/ML IJ SOLN
INTRAMUSCULAR | Status: DC | PRN
  Administered 2014-05-22: 50 ug via INTRAVENOUS
  Administered 2014-05-22: 100 ug via INTRAVENOUS
  Administered 2014-05-22 (×5): 50 ug via INTRAVENOUS

## 2014-05-22 MED ORDER — SODIUM CHLORIDE 0.9 % IJ SOLN
10.0000 mL | INTRAMUSCULAR | Status: DC | PRN
  Filled 2014-05-22: qty 10

## 2014-05-22 MED ORDER — POLYMYXIN B SULFATE 500000 UNITS IJ SOLR
INTRAMUSCULAR | Status: DC | PRN
  Administered 2014-05-22: 18:00:00

## 2014-05-22 MED ORDER — ROCURONIUM BROMIDE 100 MG/10ML IV SOLN
INTRAVENOUS | Status: DC | PRN
Start: 2014-05-22 — End: 2014-05-22
  Administered 2014-05-22: 30 mg via INTRAVENOUS

## 2014-05-22 MED ORDER — OXYCODONE HCL 5 MG PO TABS: 5.0000 mg | ORAL_TABLET | Freq: Once | ORAL | Status: DC | PRN

## 2014-05-22 MED ORDER — HEPARIN SOD (PORK) LOCK FLUSH 100 UNIT/ML IV SOLN
500.0000 [IU] | Freq: Once | INTRAVENOUS | Status: AC
  Administered 2014-05-22: 500 [IU] via INTRAVENOUS
  Filled 2014-05-22: qty 5

## 2014-05-22 MED ORDER — OXYCODONE HCL 5 MG/5ML PO SOLN: 5.0000 mg | Freq: Once | ORAL | Status: DC | PRN

## 2014-05-22 MED ORDER — ACETAMINOPHEN 325 MG PO TABS
650.0000 mg | ORAL_TABLET | Freq: Once | ORAL | Status: AC
  Administered 2014-05-22: 650 mg via ORAL

## 2014-05-22 MED ORDER — SODIUM CHLORIDE 0.9 % IV SOLN
Freq: Once | INTRAVENOUS | Status: AC
  Administered 2014-05-22: 10:00:00 via INTRAVENOUS

## 2014-05-22 MED ORDER — DIPHENHYDRAMINE HCL 25 MG PO CAPS
25.0000 mg | ORAL_CAPSULE | Freq: Once | ORAL | Status: AC
  Administered 2014-05-22: 25 mg via ORAL

## 2014-05-22 MED ORDER — DIPHENHYDRAMINE HCL 25 MG PO CAPS
ORAL_CAPSULE | ORAL | Status: AC
  Filled 2014-05-22: qty 1

## 2014-05-22 MED ORDER — MIDAZOLAM HCL 2 MG/2ML IJ SOLN: 0.5000 mg | Freq: Once | INTRAMUSCULAR | Status: DC | PRN

## 2014-05-22 MED ORDER — MIDAZOLAM HCL 2 MG/2ML IJ SOLN
INTRAMUSCULAR | Status: AC
  Filled 2014-05-22: qty 2

## 2014-05-22 MED ORDER — SODIUM CHLORIDE 0.9 % IJ SOLN
10.0000 mL | INTRAMUSCULAR | Status: DC | PRN
  Administered 2014-05-22: 10 mL
  Filled 2014-05-22: qty 10

## 2014-05-22 MED ORDER — SODIUM CHLORIDE 0.9 % IJ SOLN
10.0000 mL | INTRAMUSCULAR | Status: DC | PRN
  Administered 2014-05-22: 10 mL via INTRAVENOUS
  Filled 2014-05-22: qty 10

## 2014-05-22 MED ORDER — PROPOFOL 10 MG/ML IV BOLUS
INTRAVENOUS | Status: AC
  Filled 2014-05-22: qty 20

## 2014-05-22 MED ORDER — CEFAZOLIN SODIUM-DEXTROSE 2-3 GM-% IV SOLR
2.0000 g | Freq: Three times a day (TID) | INTRAVENOUS | Status: DC
  Administered 2014-05-23: 2 g via INTRAVENOUS
  Filled 2014-05-22 (×3): qty 50

## 2014-05-22 MED ORDER — HEPARIN SOD (PORK) LOCK FLUSH 100 UNIT/ML IV SOLN
500.0000 [IU] | Freq: Once | INTRAVENOUS | Status: AC | PRN
  Administered 2014-05-22: 500 [IU]
  Filled 2014-05-22: qty 5

## 2014-05-22 SURGICAL SUPPLY — 48 items
BAG DECANTER FOR FLEXI CONT (MISCELLANEOUS) ×2 IMPLANT
BINDER BREAST LRG (GAUZE/BANDAGES/DRESSINGS) IMPLANT
BINDER BREAST XLRG (GAUZE/BANDAGES/DRESSINGS) IMPLANT
BLADE 10 SAFETY STRL DISP (BLADE) ×2 IMPLANT
CANISTER SUCTION 2500CC (MISCELLANEOUS) ×2 IMPLANT
CHLORAPREP W/TINT 26ML (MISCELLANEOUS) ×2 IMPLANT
COVER SURGICAL LIGHT HANDLE (MISCELLANEOUS) ×2 IMPLANT
DERMABOND ADVANCED (GAUZE/BANDAGES/DRESSINGS) ×1
DERMABOND ADVANCED .7 DNX12 (GAUZE/BANDAGES/DRESSINGS) ×1 IMPLANT
DRAIN CHANNEL 19F RND (DRAIN) ×2 IMPLANT
DRAPE ORTHO SPLIT 77X108 STRL (DRAPES) ×2
DRAPE PROXIMA HALF (DRAPES) ×2 IMPLANT
DRAPE SURG ORHT 6 SPLT 77X108 (DRAPES) ×2 IMPLANT
DRAPE WARM FLUID 44X44 (DRAPE) IMPLANT
DRSG PAD ABDOMINAL 8X10 ST (GAUZE/BANDAGES/DRESSINGS) ×4 IMPLANT
ELECT BLADE 4.0 EZ CLEAN MEGAD (MISCELLANEOUS) ×2
ELECT REM PT RETURN 9FT ADLT (ELECTROSURGICAL) ×2
ELECTRODE BLDE 4.0 EZ CLN MEGD (MISCELLANEOUS) ×1 IMPLANT
ELECTRODE REM PT RTRN 9FT ADLT (ELECTROSURGICAL) ×1 IMPLANT
EVACUATOR SILICONE 100CC (DRAIN) ×2 IMPLANT
GAUZE SPONGE 4X4 12PLY STRL (GAUZE/BANDAGES/DRESSINGS) ×2 IMPLANT
GLOVE BIO SURGEON STRL SZ 6 (GLOVE) ×2 IMPLANT
GLOVE SURG SS PI 6.0 STRL IVOR (GLOVE) ×2 IMPLANT
GOWN STRL REUS W/ TWL LRG LVL3 (GOWN DISPOSABLE) ×2 IMPLANT
GOWN STRL REUS W/TWL LRG LVL3 (GOWN DISPOSABLE) ×2
IMPL BREAST TIS EXP M 350CC (Breast) ×1 IMPLANT
IMPLANT BREAST TIS EXP M 350CC (Breast) ×2 IMPLANT
KIT BASIN OR (CUSTOM PROCEDURE TRAY) ×2 IMPLANT
KIT FILL SYSTEM UNIVERSAL (SET/KITS/TRAYS/PACK) ×2 IMPLANT
KIT ROOM TURNOVER OR (KITS) ×2 IMPLANT
NS IRRIG 1000ML POUR BTL (IV SOLUTION) ×4 IMPLANT
PACK GENERAL/GYN (CUSTOM PROCEDURE TRAY) ×2 IMPLANT
PAD ABD 8X10 STRL (GAUZE/BANDAGES/DRESSINGS) ×2 IMPLANT
PAD ARMBOARD 7.5X6 YLW CONV (MISCELLANEOUS) ×2 IMPLANT
PIN SAFETY STERILE (MISCELLANEOUS) ×2 IMPLANT
SOLUTION BETADINE 4OZ (MISCELLANEOUS) ×2 IMPLANT
SPONGE GAUZE 4X4 12PLY STER LF (GAUZE/BANDAGES/DRESSINGS) ×2 IMPLANT
STAPLER VISISTAT 35W (STAPLE) ×2 IMPLANT
SUT ETHILON 2 0 FS 18 (SUTURE) ×2 IMPLANT
SUT MNCRL AB 4-0 PS2 18 (SUTURE) ×2 IMPLANT
SUT PROLENE 2 0 SH 30 (SUTURE) ×2 IMPLANT
SUT VIC AB 3-0 PS2 18 (SUTURE) ×3
SUT VIC AB 3-0 PS2 18XBRD (SUTURE) ×3 IMPLANT
SUT VIC AB 4-0 PS2 27 (SUTURE) ×4 IMPLANT
TAPE CLOTH SURG 6X10 WHT LF (GAUZE/BANDAGES/DRESSINGS) ×2 IMPLANT
TOWEL OR 17X24 6PK STRL BLUE (TOWEL DISPOSABLE) ×2 IMPLANT
TOWEL OR 17X26 10 PK STRL BLUE (TOWEL DISPOSABLE) ×2 IMPLANT
TRAY FOLEY CATH 14FRSI W/METER (CATHETERS) IMPLANT

## 2014-05-22 NOTE — Patient Instructions (Signed)

## 2014-05-22 NOTE — Patient Instructions (Signed)

## 2014-05-22 NOTE — Progress Notes (Signed)
Pt reports she had a fever on Monday and Tuesday, went as high as 103. Pt and partner state they both felt bad for a few days, possibly something they ate. Also recently had drain removed, will be going to see surgeon today. Dr Vivia Birmingham notified, OK to proceed with herceptin. Will collect UA and culture.

## 2014-05-22 NOTE — Anesthesia Preprocedure Evaluation (Addendum)
Anesthesia Evaluation  Patient identified by MRN, date of birth, ID band Patient awake    Reviewed: Allergy & Precautions, H&P , NPO status , Patient's Chart, lab work & pertinent test results  History of Anesthesia Complications Negative for: history of anesthetic complications  Airway Mallampati: II TM Distance: >3 FB Neck ROM: Full    Dental  (+) Teeth Intact, Dental Advisory Given   Pulmonary neg pulmonary ROS,  breath sounds clear to auscultation        Cardiovascular hypertension (not on meds since chemo), Rhythm:Regular Rate:Normal  7/15 ECHO: EF 55-60%, trivial MR   Neuro/Psych TIA   GI/Hepatic negative GI ROS, Neg liver ROS,   Endo/Other  negative endocrine ROS  Renal/GU negative Renal ROS     Musculoskeletal   Abdominal (+) + obese,   Peds  Hematology   Anesthesia Other Findings Breast cancer: surgery, chemo  Reproductive/Obstetrics                         Anesthesia Physical Anesthesia Plan  ASA: III  Anesthesia Plan: General   Post-op Pain Management:    Induction: Intravenous  Airway Management Planned: Oral ETT  Additional Equipment:   Intra-op Plan:   Post-operative Plan: Extubation in OR  Informed Consent: I have reviewed the patients History and Physical, chart, labs and discussed the procedure including the risks, benefits and alternatives for the proposed anesthesia with the patient or authorized representative who has indicated his/her understanding and acceptance.   Dental advisory given  Plan Discussed with: CRNA and Surgeon  Anesthesia Plan Comments: (Plan routine monitors, GETA)        Anesthesia Quick Evaluation

## 2014-05-22 NOTE — H&P (Signed)
  Subjective:    Patient ID: Kelsey Mclaughlin is a 53 y.o. female.  Follow-up  6 weeks post op. Presents today with c/o fever for last two days to 103 and notes redness of reconstructed breast starting today. Obtained labs at infusion today with WBC 15.2. UA negative. No emesis, + nausea. Her back drain was removed last visit 1 week ago, breast drain removed 05/02/14.   Final pathology with two foci IDC 1.3 and 1.0 cm, margins negative. +lymphovascular invasion. Plan for AI as adjuvant treatment. The patient was originally diagnosed at age 46 with invasive ductal carcinoma in the left upper outer quadrant near the axilla. The patient was in [place] and underwent lumpectomy and full axillary lymph node dissection with ALND, XRT, chemotherapy. 2/26 lymph nodes positive Screening [facility] in December 2014 demonstrated breast asymmetry and follow up biospy demonstrated mass measuring 1 x 0.4 x 1.7 cm 2 cm from the nipple. , IDC, ER/PR +, Her 2 +. She completed neoadjuvant chemotherapy. 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 She has lymphedema of LUE and uses sleeve and compression pump.   Prior bra C cup on larger R breast side. Desires this or larger.   Review of Systems     Objective:    Physical Exam CV: regular rate, normal heart sounds PULM: clear to ascultation Back incision flat Left reconstructed breast with erythema Assessment:      Left breast cancer Suspect seroma left breast with erythema    Plan:    Plan OR today for washout left breast cavity, possible exchange TE, and placement of drain. Will plan outpatient procedure pending findings. Pictures taken.   [place] Style 9200 350 ml tissue expander Total fill volume 375 ml   Irene Limbo, MD [facility] Plastic & Reconstructive Surgery [phone]

## 2014-05-22 NOTE — Anesthesia Procedure Notes (Signed)
Procedure Name: Intubation Date/Time: 05/22/2014 5:35 PM Performed by: Izora Gala Pre-anesthesia Checklist: Patient identified, Emergency Drugs available, Suction available and Patient being monitored Patient Re-evaluated:Patient Re-evaluated prior to inductionOxygen Delivery Method: Circle system utilized Preoxygenation: Pre-oxygenation with 100% oxygen Intubation Type: IV induction Ventilation: Mask ventilation without difficulty Laryngoscope Size: Miller and 3 Grade View: Grade I Tube type: Oral Tube size: 7.5 mm Number of attempts: 1 Airway Equipment and Method: Stylet Placement Confirmation: ETT inserted through vocal cords under direct vision,  positive ETCO2 and breath sounds checked- equal and bilateral Secured at: 22 cm Tube secured with: Tape Dental Injury: Teeth and Oropharynx as per pre-operative assessment

## 2014-05-22 NOTE — Anesthesia Postprocedure Evaluation (Signed)
  Anesthesia Post-op Note  Patient: Kelsey Mclaughlin  Procedure(s) Performed: Procedure(s): PLACEMENT OF TISSUE EXPANDER/I&D WASHOUT SEROMA (Left)  Patient Location: PACU  Anesthesia Type:General  Level of Consciousness: awake, alert , oriented and patient cooperative  Airway and Oxygen Therapy: Patient Spontanous Breathing  Post-op Pain: mild  Post-op Assessment: Post-op Vital signs reviewed, Patient's Cardiovascular Status Stable, Respiratory Function Stable, Patent Airway, No signs of Nausea or vomiting and Pain level controlled  Post-op Vital Signs: stable  Last Vitals:  Filed Vitals:   05/22/14 1942  BP:   Pulse: 94  Temp: 37.8 C  Resp: 17    Complications: No apparent anesthesia complications

## 2014-05-22 NOTE — Transfer of Care (Signed)
Immediate Anesthesia Transfer of Care Note  Patient: Kelsey Mclaughlin  Procedure(s) Performed: Procedure(s): PLACEMENT OF TISSUE EXPANDER/I&D WASHOUT SEROMA (Left)  Patient Location: PACU  Anesthesia Type:General  Level of Consciousness: awake, alert  and oriented  Airway & Oxygen Therapy: Patient Spontanous Breathing  Post-op Assessment: Report given to PACU RN, Post -op Vital signs reviewed and stable and Patient moving all extremities  Post vital signs: Reviewed and stable  Complications: No apparent anesthesia complications

## 2014-05-22 NOTE — Op Note (Signed)
Operative Note   DATE OF OPERATION: 9.9.2015  LOCATION: [facility]  SURGICAL DIVISION: Plastic Surgery  PREOPERATIVE DIAGNOSES:  1. History of recurrent breast cancer 2. Acquired absence of left breast 3. Seroma complicating a procedure 4. History of radiation therapy  POSTOPERATIVE DIAGNOSES:  same  PROCEDURE:  Drainage of left breast seroma and exchange of tissue expander  SURGEON: Irene Limbo MD MBA  ASSISTANT: none  ANESTHESIA:  General.   EBL: 25 ml  COMPLICATIONS: None immediate.   INDICATIONS FOR PROCEDURE:  The patient, Kelsey Mclaughlin, is a 53 y.o. female born on 10/29/1960, is here for treatment of left breast seroma. Patient has undergone prior reconstruction with latissimus flap and tissue expander placement. She has a history of prior radiation treatment to area for breast cancer. She presented with 3 days of fever and one day of breast erythema.    FINDINGS: Seroma left breast. [place] style 9200 350 ml expander placed in cavity, Initial fill volume 250 ml. SN [id number]  DESCRIPTION OF PROCEDURE:  The patient's operative site was marked with the patient in the preoperative area. The patient was taken to the operating room. SCDs were placed and IV antibiotics were given. The patient's operative site was prepped and draped in a sterile fashion. A time out was performed and all information was confirmed to be correct. Incision made through caudal inset of latissimus flap skin paddle. Limited elevation of inferior mastectomy flap completed to expose latissimus muscle. Muscle divided in direction of fibers to enter breast capsule. Clear seroma fluid encountered. Tissue expander removed. Cavity examination normal, no communication with back donor site. Cavity irrigated with 2 liters fluid including bacitracin-polymyxin solution. 19 Fr drain placed in cavity. Closure completed with interrupted 2-0 prolene to tack latissimus muscle inferiorly to chest wall. 3-0  vicryl used to close superficial fascia followed by 4-0 vicryl in dermis and 4-0 monocryl subcuticular for skin closure. The expander port was accessed and filled to initial volume 250 ml. Dermabond and dry dressing applied.   The patient was allowed to wake from anesthesia, extubated and taken to the recovery room in satisfactory condition.   SPECIMENS: Cultures  DRAINS: 50 Fr in left reconstructed breast  Irene Limbo, MD [facility] Plastic & Reconstructive Surgery [phone]

## 2014-05-23 ENCOUNTER — Encounter (HOSPITAL_COMMUNITY): Payer: Self-pay | Admitting: Plastic Surgery

## 2014-05-23 DIAGNOSIS — IMO0002 Reserved for concepts with insufficient information to code with codable children: Secondary | ICD-10-CM | POA: Diagnosis not present

## 2014-05-23 LAB — URINE CULTURE

## 2014-05-23 MED ORDER — VALACYCLOVIR HCL 1 G PO TABS: 2000.0000 mg | ORAL_TABLET | Freq: Once | ORAL | Status: DC

## 2014-05-23 MED ORDER — HYDROCODONE-ACETAMINOPHEN 10-325 MG PO TABS: 1.0000 | ORAL_TABLET | ORAL | Status: DC | PRN

## 2014-05-23 MED ORDER — VALACYCLOVIR HCL 500 MG PO TABS
2000.0000 mg | ORAL_TABLET | Freq: Once | ORAL | Status: AC
  Administered 2014-05-23: 2000 mg via ORAL
  Filled 2014-05-23: qty 4

## 2014-05-23 MED ORDER — SULFAMETHOXAZOLE-TMP DS 800-160 MG PO TABS: 1.0000 | ORAL_TABLET | Freq: Two times a day (BID) | ORAL | Status: DC

## 2014-05-23 MED ORDER — HYDROCODONE-ACETAMINOPHEN 10-325 MG PO TABS
1.0000 | ORAL_TABLET | ORAL | Status: DC | PRN
Start: 2014-05-23 — End: 2014-08-29

## 2014-05-23 NOTE — Progress Notes (Signed)
D/c with wife to home. D/c instructions given and understood by wife and patient demonstrated understanding via teach back.  Pain denied.

## 2014-05-23 NOTE — Discharge Summary (Signed)
Physician Discharge Summary  Patient ID: Kelsey Mclaughlin MRN: [id number] DOB/AGE: 15-Jul-1961 53 y.o.  Admit date: 05/22/2014 Discharge date: 05/23/2014  Admission Diagnoses: Acquired absence breast, recurrent breast cancer, seroma  Discharge Diagnoses: Acquired absence breast, recurrent breast cancer, seroma Active Problems:   Personal history of malignant neoplasm of breast   Discharged Condition: stable  Hospital Course: Patient taken to OR for drainage suspected seroma left breast with 3 day history fever. Intraoperatively seroma fluid found, expander exchanged. Patient remained overnight for observation for fluids, additional IV anitbiotic doses. She was noted on POD#1 to have herpes labialis outbreak. First dose Valcyclovir given. Breast exam improved though still warmth present. Cultures pending.   Discharge Exam: Blood pressure 137/71, pulse 92, temperature 99.2 F (37.3 C), temperature source Oral, resp. rate 16, height 5\' 7"  (1.702 m), weight 78.79 kg (173 lb 11.2 oz), SpO2 100.00%.  Improved edema erythema left reconstructed breast, still warmth present over inferior poles breast JP watery, serosanguinous Alert, no pain Incisions dry  Disposition: 01-Home or Self Care  Discharge Instructions   Call MD for:  redness, tenderness, or signs of infection (pain, swelling, bleeding, redness, odor or green/yellow discharge around incision site)    Complete by:  As directed      Call MD for:  severe or increased pain, loss or decreased feeling  in affected limb(s)    Complete by:  As directed      Call MD for:  temperature >100.5    Complete by:  As directed      Discharge instructions    Complete by:  As directed   Ok to shower starting 05/24/14. Pat incisions dry.  Soft support bra all time.   No strenuous exercise or activity.  JP drain to bulb suction,strip and record twice daily.     Lifting restrictions    Complete by:  As directed   No lifting greater than  5-10 lbs     Resume previous diet    Complete by:  As directed             Medication List         HERCEPTIN 440 MG injection  Generic drug:  trastuzumab  Inject 504 mg into the vein once. For 30 mins     HYDROcodone-acetaminophen 10-325 MG per tablet  Commonly known as:  NORCO  Take 1 tablet by mouth every 4 (four) hours as needed for moderate pain.     HYDROcodone-acetaminophen 10-325 MG per tablet  Commonly known as:  NORCO  Take 1 tablet by mouth every 4 (four) hours as needed.     lidocaine-prilocaine cream  Commonly known as:  EMLA  Apply 1 application topically as needed (over port).     senna-docusate 8.6-50 MG per tablet  Commonly known as:  Senokot-S  Take 1 tablet by mouth daily.     sulfamethoxazole-trimethoprim 800-160 MG per tablet  Commonly known as:  BACTRIM DS  Take 1 tablet by mouth 2 (two) times daily.     valACYclovir 1000 MG tablet  Commonly known as:  VALTREX  Take 2 tablets (2,000 mg total) by mouth once.           Follow-up Information   Follow up with [facility], Brandonlee Navis, MD. Schedule an appointment as soon as possible for a visit in 1 week.   Specialty:  Plastic Surgery   Contact information:   [address] [place] [address] [phone]       Signed:  Suleika Donavan 05/23/2014, 7:34 AM

## 2014-05-25 LAB — WOUND CULTURE

## 2014-05-27 LAB — ANAEROBIC CULTURE

## 2014-06-12 ENCOUNTER — Telehealth: Payer: Self-pay | Admitting: Oncology

## 2014-06-12 ENCOUNTER — Other Ambulatory Visit: Payer: Self-pay | Admitting: Emergency Medicine

## 2014-06-12 ENCOUNTER — Ambulatory Visit (HOSPITAL_BASED_OUTPATIENT_CLINIC_OR_DEPARTMENT_OTHER): Payer: Federal, State, Local not specified - PPO | Admitting: Oncology

## 2014-06-12 ENCOUNTER — Other Ambulatory Visit (HOSPITAL_BASED_OUTPATIENT_CLINIC_OR_DEPARTMENT_OTHER): Payer: Federal, State, Local not specified - PPO

## 2014-06-12 ENCOUNTER — Ambulatory Visit (HOSPITAL_BASED_OUTPATIENT_CLINIC_OR_DEPARTMENT_OTHER): Payer: Federal, State, Local not specified - PPO

## 2014-06-12 VITALS — BP 125/63 | HR 54 | Temp 98.4°F | Resp 18 | Ht 67.0 in | Wt 173.5 lb

## 2014-06-12 DIAGNOSIS — C50419 Malignant neoplasm of upper-outer quadrant of unspecified female breast: Secondary | ICD-10-CM

## 2014-06-12 DIAGNOSIS — K760 Fatty (change of) liver, not elsewhere classified: Secondary | ICD-10-CM

## 2014-06-12 DIAGNOSIS — C50412 Malignant neoplasm of upper-outer quadrant of left female breast: Secondary | ICD-10-CM

## 2014-06-12 DIAGNOSIS — Z170 Estrogen receptor positive status [ER+]: Secondary | ICD-10-CM

## 2014-06-12 DIAGNOSIS — Z5112 Encounter for antineoplastic immunotherapy: Secondary | ICD-10-CM

## 2014-06-12 LAB — COMPREHENSIVE METABOLIC PANEL (CC13)
ALT: 72 U/L — ABNORMAL HIGH (ref 0–55)
AST: 44 U/L — ABNORMAL HIGH (ref 5–34)
Albumin: 3.5 g/dL (ref 3.5–5.0)
Alkaline Phosphatase: 93 U/L (ref 40–150)
Anion Gap: 9 mEq/L (ref 3–11)
BUN: 11.3 mg/dL (ref 7.0–26.0)
CALCIUM: 9.7 mg/dL (ref 8.4–10.4)
CO2: 27 meq/L (ref 22–29)
CREATININE: 0.7 mg/dL (ref 0.6–1.1)
Chloride: 106 mEq/L (ref 98–109)
Glucose: 89 mg/dl (ref 70–140)
Potassium: 3.9 mEq/L (ref 3.5–5.1)
Sodium: 142 mEq/L (ref 136–145)
Total Bilirubin: <0.2 mg/dL (ref 0.20–1.20)
Total Protein: 6.7 g/dL (ref 6.4–8.3)

## 2014-06-12 LAB — CBC WITH DIFFERENTIAL/PLATELET
BASO%: 0.6 % (ref 0.0–2.0)
BASOS ABS: 0 10*3/uL (ref 0.0–0.1)
EOS%: 4.9 % (ref 0.0–7.0)
Eosinophils Absolute: 0.4 10*3/uL (ref 0.0–0.5)
HCT: 37.7 % (ref 34.8–46.6)
HGB: 12.2 g/dL (ref 11.6–15.9)
LYMPH%: 25.5 % (ref 14.0–49.7)
MCH: 29 pg (ref 25.1–34.0)
MCHC: 32.4 g/dL (ref 31.5–36.0)
MCV: 89.6 fL (ref 79.5–101.0)
MONO#: 0.9 10*3/uL (ref 0.1–0.9)
MONO%: 11.5 % (ref 0.0–14.0)
NEUT#: 4.2 10*3/uL (ref 1.5–6.5)
NEUT%: 57.5 % (ref 38.4–76.8)
Platelets: 323 10*3/uL (ref 145–400)
RBC: 4.21 10*6/uL (ref 3.70–5.45)
RDW: 13.7 % (ref 11.2–14.5)
WBC: 7.4 10*3/uL (ref 3.9–10.3)
lymph#: 1.9 10*3/uL (ref 0.9–3.3)

## 2014-06-12 MED ORDER — SODIUM CHLORIDE 0.9 % IJ SOLN
10.0000 mL | INTRAMUSCULAR | Status: DC | PRN
  Administered 2014-06-12: 10 mL
  Filled 2014-06-12: qty 10

## 2014-06-12 MED ORDER — SODIUM CHLORIDE 0.9 % IV SOLN
Freq: Once | INTRAVENOUS | Status: AC
  Administered 2014-06-12: 14:00:00 via INTRAVENOUS

## 2014-06-12 MED ORDER — ACETAMINOPHEN 325 MG PO TABS
650.0000 mg | ORAL_TABLET | Freq: Once | ORAL | Status: AC
  Administered 2014-06-12: 650 mg via ORAL

## 2014-06-12 MED ORDER — ACETAMINOPHEN 325 MG PO TABS
ORAL_TABLET | ORAL | Status: AC
  Filled 2014-06-12: qty 2

## 2014-06-12 MED ORDER — DIPHENHYDRAMINE HCL 25 MG PO CAPS
ORAL_CAPSULE | ORAL | Status: AC
  Filled 2014-06-12: qty 1

## 2014-06-12 MED ORDER — DIPHENHYDRAMINE HCL 25 MG PO CAPS
25.0000 mg | ORAL_CAPSULE | Freq: Once | ORAL | Status: AC
  Administered 2014-06-12: 25 mg via ORAL

## 2014-06-12 MED ORDER — TRASTUZUMAB CHEMO INJECTION 440 MG
6.0000 mg/kg | Freq: Once | INTRAVENOUS | Status: AC
  Administered 2014-06-12: 504 mg via INTRAVENOUS
  Filled 2014-06-12: qty 24

## 2014-06-12 MED ORDER — HEPARIN SOD (PORK) LOCK FLUSH 100 UNIT/ML IV SOLN
500.0000 [IU] | Freq: Once | INTRAVENOUS | Status: AC | PRN
  Administered 2014-06-12: 500 [IU]
  Filled 2014-06-12: qty 5

## 2014-06-12 NOTE — Telephone Encounter (Signed)
[facility] RELATIVE APPT SCHEDULE FOR OCT THRU DEC. MESSAGE TO PREAUTH RE ECHO. RELATIVE AWARE PT TO BE CONTACTED W/ECHO ONCE PRE AUTH RECEIVED. PER DESK NURSE PT TO CONTINUE W/HERCPTIN. LAST DAY IN TX PLAN WAS TODAY.

## 2014-06-12 NOTE — Patient Instructions (Signed)
[facility] Discharge Instructions for Patients Receiving Chemotherapy  Today you received the following chemotherapy agents Herceptin.  To help prevent nausea and vomiting after your treatment, we encourage you to take your nausea medication as directed.   If you develop nausea and vomiting that is not controlled by your nausea medication, call the clinic.   BELOW ARE SYMPTOMS THAT SHOULD BE REPORTED IMMEDIATELY:  *FEVER GREATER THAN 100.5 F  *CHILLS WITH OR WITHOUT FEVER  NAUSEA AND VOMITING THAT IS NOT CONTROLLED WITH YOUR NAUSEA MEDICATION  *UNUSUAL SHORTNESS OF BREATH  *UNUSUAL BRUISING OR BLEEDING  TENDERNESS IN MOUTH AND THROAT WITH OR WITHOUT PRESENCE OF ULCERS  *URINARY PROBLEMS  *BOWEL PROBLEMS  UNUSUAL RASH Items with * indicate a potential emergency and should be followed up as soon as possible.  Feel free to call the clinic you have any questions or concerns. The clinic phone number is [phone].  

## 2014-06-12 NOTE — Progress Notes (Signed)
[facility]  [phone] [phone]     ID: Kelsey Mclaughlin OB: March 04, 1961  MR#: [id number]  ITG#:[id number]  PCP: Default, Provider, MD GYN:   SU: Donnie Mesa MD OTHER MD: Irene Limbo MD, Gwendolyn Grant MD  CHIEF COMPLAINT: Breast cancer, recurrent, estrogen receptor positive CURRENT TREATMENT: anti HER-2 immunotherapy; to start anti-estrogen therapy  BREAST CANCER HISTORY: From doctor [name]'s intake note 83/05/4075:  "Kelsey Mclaughlin is a 53 y.o. female. In 1998 at the age of 30 was diagnosed with left breast cancer in [place]. This was an invasive carcinoma grade 2 stage II. At that time she underwent a lumpectomy with excellent lymph node dissection. She received 6 months of chemotherapy. [name] She also had radiation therapy to the left breast. 2011 patient noticed a lump in the outside of her left breast. She had ultrasound workup performed and was told it was negative no biopsies were performed. General 2014 after patient moved to the [place] she had a mammogram performed this revealed a possible mass in the outer quadrant of the left breast. Ultrasound showed it to be 1.7 cm. MRI of the breasts performed on January 30 revealed the mass to be 1.5 x 2.2 x 1.5 cm. Because of this she also had a biopsy performed on 10/04/2013. The biopsy revealed [[id number]) an invasive ductal carcinoma with ductal carcinoma in situ grade 2. Prognostic panel was positive for estrogen receptor 100% megestrol receptor +33% proliferation marker Ki-67 15% and the tumor was HER-2/neu positive" [with a signals ratio of 3.1 and number per cell 6.3]  The patient's subsequent history is as detailed below  INTERVAL HISTORY: Kelsey Mclaughlin returns today for followup of her breast cancer accompanied by her spouse [name]. She continues in process of breast reconstruction, and had a setback  requiring drainage of a left breast seroma and exchange of her tissue expander, which was performed 05/22/2014. She still has a drain in place. She has some soreness particularly in the left axilla. She is not taking any pain medication at present. Because of these issues she did not get her echocardiogram or bone density as scheduled  REVIEW OF SYSTEMS: A detailed review of systems today was stable except as just noted  PAST MEDICAL HISTORY: Past Medical History  Diagnosis Date  . Cancer 1998, 2015    breast  . Bronchitis     completed antibiotics 10/21/13/ states resolved  . Stroke 2004    TIA-  no problems since  . Hypertension     no meds  . Lymph edema     lt arm    PAST SURGICAL HISTORY: Past Surgical History  Procedure Laterality Date  . Breast surgery  1998    lumpectomy on left and removed lymphnodes in [place]  . Portacath placement Right 10/25/2013    Procedure: INSERTION PORT-A-CATH;  Surgeon: Imogene Burn. Tsuei, MD;  Location: [facility];  Service: General;  Laterality: Right;  . Simple mastectomy with axillary sentinel node biopsy Left 04/12/2014    Procedure: MASTECTOMY;  Surgeon: Imogene Burn. Georgette Dover, MD;  Location: [facility];  Service: General;  Laterality: Left;  . Latissimus flap to breast Left 04/12/2014    Procedure: LEFT LATISSIMUS DORSI FLAP FOR LEFT BREAST RECONSTRUCTION AND PLACEMENT OF TISSUE EXPANDER;  Surgeon: Irene Limbo, MD;  Location: [facility];  Service: Plastics;  Laterality: Left;  . Tissue expander placement Left 04/12/2014    Procedure: TISSUE  EXPANDER;  Surgeon: Irene Limbo, MD;  Location: [facility];  Service: Plastics;  Laterality: Left;  . Tissue expander placement Left 05/22/2014    Procedure: PLACEMENT OF TISSUE EXPANDER/I&D WASHOUT SEROMA;  Surgeon: Irene Limbo, MD;  Location: [facility];  Service: Plastics;  Laterality: Left;    FAMILY HISTORY Family History  Problem Relation Age of Onset  . Breast cancer Mother 9  . Heart disease Father   . Heart  attack Father   . Breast cancer Maternal Grandmother 39  . Brain cancer Maternal Uncle 74    benign brain tumor  . Heart attack Paternal Aunt     GYNECOLOGIC HISTORY:   menarche age 48, first live birth age 71. The patient is GX P2. She went through the change of life approximately 2011.   SOCIAL HISTORY:  The patient worked previously as a Marine scientist. She has 2 children both of whom live in [place], works for the [employer], and [employer], a Librarian, academic. They are in their early 29s. There are no grandchildren. The patient married [name] (both women are originally from [place]) in [place]. [place] with 5 dogs, 10 chickens and 2 courses. They're not church attenders    ADVANCED DIRECTIVES:    HEALTH MAINTENANCE: History  Substance Use Topics  . Smoking status: Never Smoker   . Smokeless tobacco: Never Used  . Alcohol Use: Yes     Comment: 2 drinks/day     Colonoscopy:  PAP:  Bone density:  Lipid panel:  Allergies  Allergen Reactions  . Adhesive [Tape] Rash    Band-aids    Current Outpatient Prescriptions  Medication Sig Dispense Refill  . HYDROcodone-acetaminophen (NORCO) 10-325 MG per tablet Take 1 tablet by mouth every 4 (four) hours as needed for moderate pain.      Marland Kitchen HYDROcodone-acetaminophen (NORCO) 10-325 MG per tablet Take 1 tablet by mouth every 4 (four) hours as needed.  40 tablet  0  . lidocaine-prilocaine (EMLA) cream Apply 1 application topically as needed (over port).       . senna-docusate (SENOKOT-S) 8.6-50 MG per tablet Take 1 tablet by mouth daily.  20 tablet  0  . sulfamethoxazole-trimethoprim (BACTRIM DS) 800-160 MG per tablet Take 1 tablet by mouth 2 (two) times daily.  14 tablet  0  . trastuzumab (HERCEPTIN) 440 MG injection Inject 504 mg into the vein once. For 30 mins      . valACYclovir (VALTREX) 1000 MG tablet Take 2 tablets (2,000 mg total) by mouth once.  4 tablet  0   No current  facility-administered medications for this visit.    OBJECTIVE: middle-aged white woman who appears stated age 85 Vitals:   06/12/14 1242  BP: 125/63  Pulse: 54  Temp: 98.4 F (36.9 C)  Resp: 18     Body mass index is 27.17 kg/(m^2).      ECOG FS:1 - Symptomatic but completely ambulatory  Ocular: Sclerae unicteric, pupils are round and equal Ear-nose-throat: Oropharynx clear, teeth in good repair Lymphatic: No cervical or supraclavicular adenopathy Lungs no rales or rhonchi Heart regular rate and rhythm Abd soft, nontender, positive bowel sounds MSK no focal spinal tenderness,  grade 1 left upper extremity lymphedema  Neuro: non-focal, well-oriented, positive affect Breasts: The right breast is unremarkable. The left breast is status post mastectomy with expander in place and latissimus flap reconstruction. There is some duskiness of over the skin, but no evidence of dehiscence or local recurrence. There  is a drain in place with less than 10 cc serous fluid in the bulb. The left axilla is benign.   LAB RESULTS:  CMP     Component Value Date/Time   NA 135* 05/22/2014 0930   NA 139 04/13/2014 0430   K 3.5 05/22/2014 0930   K 4.3 04/13/2014 0430   CL 104 04/13/2014 0430   CO2 23 05/22/2014 0930   CO2 21 04/13/2014 0430   GLUCOSE 136 05/22/2014 0930   GLUCOSE 129* 04/13/2014 0430   BUN 6.9* 05/22/2014 0930   BUN 10 04/13/2014 0430   CREATININE 0.7 05/22/2014 0930   CREATININE 0.52 04/13/2014 0430   CALCIUM 9.6 05/22/2014 0930   CALCIUM 8.8 04/13/2014 0430   PROT 7.1 05/22/2014 0930   ALBUMIN 3.2* 05/22/2014 0930   AST 19 05/22/2014 0930   ALT 40 05/22/2014 0930   ALKPHOS 73 05/22/2014 0930   BILITOT 0.27 05/22/2014 0930   GFRNONAA >90 04/13/2014 0430   GFRAA >90 04/13/2014 0430    I No results found for this basename: SPEP,  UPEP,   kappa and lambda light chains    Lab Results  Component Value Date   WBC 7.4 06/12/2014   NEUTROABS 4.2 06/12/2014   HGB 12.2 06/12/2014   HCT 37.7 06/12/2014   MCV 89.6  06/12/2014   PLT 323 06/12/2014      Chemistry      Component Value Date/Time   NA 135* 05/22/2014 0930   NA 139 04/13/2014 0430   K 3.5 05/22/2014 0930   K 4.3 04/13/2014 0430   CL 104 04/13/2014 0430   CO2 23 05/22/2014 0930   CO2 21 04/13/2014 0430   BUN 6.9* 05/22/2014 0930   BUN 10 04/13/2014 0430   CREATININE 0.7 05/22/2014 0930   CREATININE 0.52 04/13/2014 0430      Component Value Date/Time   CALCIUM 9.6 05/22/2014 0930   CALCIUM 8.8 04/13/2014 0430   ALKPHOS 73 05/22/2014 0930   AST 19 05/22/2014 0930   ALT 40 05/22/2014 0930   BILITOT 0.27 05/22/2014 0930       No results found for this basename: LABCA2    No components found with this basename: LABCA125    No results found for this basename: INR,  in the last 168 hours  Urinalysis    Component Value Date/Time   LABSPEC 1.005 05/22/2014 1130   GLUCOSEU Negative 05/22/2014 1130   UROBILINOGEN 0.2 05/22/2014 1130    STUDIES: No results found.  ASSESSMENT: 53 y.o. BRCA negative [place] woman, [place] speaker   (1) status post left lumpectomy and axillary lymph node dissection in 1998 in [place] for a stage II invasive ductal carcinoma, grade 2, treated with adjuvant chemotherapy (specific drugs not clear) and adjuvant radiation.  (2) status post left breast upper outer quadrant biopsy 10/04/2013 for a clinically mT2 N0, stage IIA invasive ductal carcinoma, grade 2, estrogen receptor 100% positive, progesterone receptor 33% positive, with an MIB-1 of 15%, and HER-2 amplified; staging studies showed no evidence of metastatic disease  (3) started on neoadjuvant carboplatin/ docetaxel/ trastuzumab/ pertuzumab 11/12/2013  (a) completed 4 cycles as of 05/06/201, after which docetaxel was discontinued because of neuropathy symptoms   (b) gemcitabine was given day 1 and day 8 with carboplatin day 1 in the final 2 cycles, completed 03/04/2014   (c) continues on trastuzumab every 21 days (through March 2016)-- most recent echo 02/25/2014  (4)  left mastectomy  04/12/2014 showed residual mpT1c pNX invasive ductal carcinoma,  grade 2, estrogen and progesterone receptor positive, HER-2 negative, with negative margins  (5) aromatase inhibitors to follow surgery  (6) 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   PLAN: Adalynd wasn't able to have her echo because of the intervening cellulitis problem. That is resolving I think will be able to get it by October 13. Nevertheless we are proceeding with trastuzumab today. There is certainly no suggestion of any congestive heart failure symptoms.  Today we discussed menopause in general and both Dondi and [name] understand the fact that hot flashes, insomnia, moodiness, thin bones, weight gain, and vaginal dryness R. or part of the process. When she starts anastrozole, this can make the hot flashes and vaginal dryness worse. Of course you can also worsen the thin bones. It does not however affect the other issues associated with menopause.  At this point Salimah is not having any hot flashes. She does have some vaginal dryness issues and I have given her information on her "intimacy and pelvic health" program. I anticipate she will be able to start her anastrozole by November 1. She is going to see me early December just to make sure she is tolerating it well. She will probably have bone density sometime late December. At this time however she wants to concentrate on getting her reconstruction completed successfully.  Sola has a good understanding of the overall plan. She agrees with it. She knows the goal of treatment in her case is cure. She will call with any problems that may develop before her next visit here.  [name], MD   06/12/2014 12:57 PM

## 2014-06-14 ENCOUNTER — Telehealth: Payer: Self-pay | Admitting: Oncology

## 2014-06-14 NOTE — Telephone Encounter (Signed)
, °

## 2014-06-17 ENCOUNTER — Telehealth: Payer: Self-pay | Admitting: Emergency Medicine

## 2014-06-17 NOTE — Telephone Encounter (Signed)
Called patient to get an update on how she is doing and how her rash is. Per patient, her rash is "much, much better". Advised her to call for any further questions or concerns and to keep all future appointment. Patient verbalized understanding.

## 2014-06-24 ENCOUNTER — Telehealth: Payer: Self-pay | Admitting: Oncology

## 2014-06-24 NOTE — Telephone Encounter (Signed)
per pre auth dept. no pre auth needed for echo. lmonvm for Kelsey Mclaughlin re appt for echo 10/16 and confirmed next appt for 12/2. appts prior to 12/2 have been cx'd. see previous documentation from 10/2.

## 2014-06-28 ENCOUNTER — Encounter (HOSPITAL_COMMUNITY): Payer: Self-pay

## 2014-06-28 ENCOUNTER — Ambulatory Visit (HOSPITAL_COMMUNITY)
Admission: RE | Admit: 2014-06-28 | Discharge: 2014-06-28 | Disposition: A | Discharge status: Home / Self Care | Payer: Federal, State, Local not specified - PPO | Source: Ambulatory Visit | Attending: Oncology | Admitting: Oncology

## 2014-06-28 DIAGNOSIS — C50919 Malignant neoplasm of unspecified site of unspecified female breast: Secondary | ICD-10-CM | POA: Diagnosis present

## 2014-06-28 DIAGNOSIS — K760 Fatty (change of) liver, not elsewhere classified: Secondary | ICD-10-CM

## 2014-06-28 DIAGNOSIS — I517 Cardiomegaly: Secondary | ICD-10-CM

## 2014-06-28 DIAGNOSIS — C50412 Malignant neoplasm of upper-outer quadrant of left female breast: Secondary | ICD-10-CM

## 2014-06-28 DIAGNOSIS — C50912 Malignant neoplasm of unspecified site of left female breast: Secondary | ICD-10-CM

## 2014-06-28 MED ORDER — SODIUM CHLORIDE 0.9 % IV SOLN
INTRAVENOUS | Status: DC
  Administered 2014-06-28: 500 mL via INTRAVENOUS
  Filled 2014-06-28: qty 1000

## 2014-06-28 MED ORDER — PERFLUTREN LIPID MICROSPHERE
1.0000 mL | INTRAVENOUS | Status: AC | PRN
  Administered 2014-06-28: 2 mL via INTRAVENOUS
  Filled 2014-06-28: qty 10

## 2014-06-28 NOTE — Progress Notes (Signed)
Echocardiogram 2D Echocardiogram with Definity has been performed.  Kelsey Mclaughlin 06/28/2014, 12:39 PM

## 2014-07-03 ENCOUNTER — Telehealth: Payer: Self-pay | Admitting: *Deleted

## 2014-07-03 ENCOUNTER — Telehealth: Payer: Self-pay | Admitting: Oncology

## 2014-07-03 ENCOUNTER — Ambulatory Visit: Payer: Federal, State, Local not specified - PPO

## 2014-07-03 ENCOUNTER — Other Ambulatory Visit: Payer: Federal, State, Local not specified - PPO

## 2014-07-03 ENCOUNTER — Other Ambulatory Visit: Payer: Self-pay | Admitting: *Deleted

## 2014-07-03 ENCOUNTER — Other Ambulatory Visit: Payer: Self-pay | Admitting: Oncology

## 2014-07-03 DIAGNOSIS — C50912 Malignant neoplasm of unspecified site of left female breast: Secondary | ICD-10-CM

## 2014-07-03 NOTE — Telephone Encounter (Signed)
Per staff message and P[facility] I have scheduled appts. Advised scheduler of appts. JMW  

## 2014-07-03 NOTE — Telephone Encounter (Signed)
, °

## 2014-07-03 NOTE — Progress Notes (Unsigned)
Kelsey Mclaughlin today with [facility]. They thought she was scheduled for treatment, but we had canceled this because of her surgical issues. These have resolved and she is now ready to be treated. Paragraph she will receive treatments October 23 and then November 12. Her next treatment will be December 2 and at that point I will change her treatment to every 2 weeks so we can get around the Christmas holidays without any delays. Paragraph [name] was very upset today regarding the postponement. I have given her my beeper number so that if she hits a wall in the future she can always call me and we can try to solve the problem that way.

## 2014-07-05 ENCOUNTER — Ambulatory Visit (HOSPITAL_BASED_OUTPATIENT_CLINIC_OR_DEPARTMENT_OTHER): Payer: Federal, State, Local not specified - PPO

## 2014-07-05 ENCOUNTER — Other Ambulatory Visit (HOSPITAL_BASED_OUTPATIENT_CLINIC_OR_DEPARTMENT_OTHER): Payer: Federal, State, Local not specified - PPO

## 2014-07-05 VITALS — BP 123/69 | HR 59 | Temp 98.1°F | Resp 18

## 2014-07-05 DIAGNOSIS — C50419 Malignant neoplasm of upper-outer quadrant of unspecified female breast: Secondary | ICD-10-CM

## 2014-07-05 DIAGNOSIS — Z5112 Encounter for antineoplastic immunotherapy: Secondary | ICD-10-CM

## 2014-07-05 DIAGNOSIS — C50912 Malignant neoplasm of unspecified site of left female breast: Secondary | ICD-10-CM

## 2014-07-05 DIAGNOSIS — C50412 Malignant neoplasm of upper-outer quadrant of left female breast: Secondary | ICD-10-CM

## 2014-07-05 LAB — CBC WITH DIFFERENTIAL/PLATELET
BASO%: 0.4 % (ref 0.0–2.0)
Basophils Absolute: 0 10*3/uL (ref 0.0–0.1)
EOS ABS: 0.4 10*3/uL (ref 0.0–0.5)
EOS%: 6.5 % (ref 0.0–7.0)
HCT: 38 % (ref 34.8–46.6)
HEMOGLOBIN: 12.4 g/dL (ref 11.6–15.9)
LYMPH%: 26.1 % (ref 14.0–49.7)
MCH: 28.8 pg (ref 25.1–34.0)
MCHC: 32.6 g/dL (ref 31.5–36.0)
MCV: 88.2 fL (ref 79.5–101.0)
MONO#: 0.5 10*3/uL (ref 0.1–0.9)
MONO%: 9.1 % (ref 0.0–14.0)
NEUT#: 3.1 10*3/uL (ref 1.5–6.5)
NEUT%: 57.9 % (ref 38.4–76.8)
Platelets: 310 10*3/uL (ref 145–400)
RBC: 4.31 10*6/uL (ref 3.70–5.45)
RDW: 14 % (ref 11.2–14.5)
WBC: 5.4 10*3/uL (ref 3.9–10.3)
lymph#: 1.4 10*3/uL (ref 0.9–3.3)

## 2014-07-05 LAB — COMPREHENSIVE METABOLIC PANEL (CC13)
ALT: 71 U/L — ABNORMAL HIGH (ref 0–55)
ANION GAP: 8 meq/L (ref 3–11)
AST: 39 U/L — ABNORMAL HIGH (ref 5–34)
Albumin: 3.7 g/dL (ref 3.5–5.0)
Alkaline Phosphatase: 99 U/L (ref 40–150)
BUN: 12.6 mg/dL (ref 7.0–26.0)
CHLORIDE: 108 meq/L (ref 98–109)
CO2: 27 meq/L (ref 22–29)
Calcium: 9.9 mg/dL (ref 8.4–10.4)
Creatinine: 0.7 mg/dL (ref 0.6–1.1)
GLUCOSE: 110 mg/dL (ref 70–140)
POTASSIUM: 4.4 meq/L (ref 3.5–5.1)
SODIUM: 143 meq/L (ref 136–145)
TOTAL PROTEIN: 6.7 g/dL (ref 6.4–8.3)
Total Bilirubin: 0.26 mg/dL (ref 0.20–1.20)

## 2014-07-05 MED ORDER — DIPHENHYDRAMINE HCL 25 MG PO CAPS
ORAL_CAPSULE | ORAL | Status: AC
  Filled 2014-07-05: qty 1

## 2014-07-05 MED ORDER — DIPHENHYDRAMINE HCL 25 MG PO CAPS
25.0000 mg | ORAL_CAPSULE | Freq: Once | ORAL | Status: AC
  Administered 2014-07-05: 25 mg via ORAL

## 2014-07-05 MED ORDER — ACETAMINOPHEN 325 MG PO TABS
650.0000 mg | ORAL_TABLET | Freq: Once | ORAL | Status: AC
  Administered 2014-07-05: 650 mg via ORAL

## 2014-07-05 MED ORDER — HEPARIN SOD (PORK) LOCK FLUSH 100 UNIT/ML IV SOLN
500.0000 [IU] | Freq: Once | INTRAVENOUS | Status: AC | PRN
  Administered 2014-07-05: 500 [IU]
  Filled 2014-07-05: qty 5

## 2014-07-05 MED ORDER — ACETAMINOPHEN 325 MG PO TABS
ORAL_TABLET | ORAL | Status: AC
  Filled 2014-07-05: qty 2

## 2014-07-05 MED ORDER — SODIUM CHLORIDE 0.9 % IJ SOLN
10.0000 mL | INTRAMUSCULAR | Status: DC | PRN
  Administered 2014-07-05: 10 mL
  Filled 2014-07-05: qty 10

## 2014-07-05 MED ORDER — TRASTUZUMAB CHEMO INJECTION 440 MG
6.0000 mg/kg | Freq: Once | INTRAVENOUS | Status: AC
  Administered 2014-07-05: 504 mg via INTRAVENOUS
  Filled 2014-07-05: qty 24

## 2014-07-05 MED ORDER — SODIUM CHLORIDE 0.9 % IV SOLN
Freq: Once | INTRAVENOUS | Status: AC
  Administered 2014-07-05: 13:00:00 via INTRAVENOUS

## 2014-07-05 NOTE — Patient Instructions (Signed)
[facility] Discharge Instructions for Patients Receiving Chemotherapy  Today you received the following chemotherapy agents Herceptin.  To help prevent nausea and vomiting after your treatment, we encourage you to take your nausea medication as directed.   If you develop nausea and vomiting that is not controlled by your nausea medication, call the clinic.   BELOW ARE SYMPTOMS THAT SHOULD BE REPORTED IMMEDIATELY:  *FEVER GREATER THAN 100.5 F  *CHILLS WITH OR WITHOUT FEVER  NAUSEA AND VOMITING THAT IS NOT CONTROLLED WITH YOUR NAUSEA MEDICATION  *UNUSUAL SHORTNESS OF BREATH  *UNUSUAL BRUISING OR BLEEDING  TENDERNESS IN MOUTH AND THROAT WITH OR WITHOUT PRESENCE OF ULCERS  *URINARY PROBLEMS  *BOWEL PROBLEMS  UNUSUAL RASH Items with * indicate a potential emergency and should be followed up as soon as possible.  Feel free to call the clinic you have any questions or concerns. The clinic phone number is [phone].  

## 2014-07-15 ENCOUNTER — Other Ambulatory Visit: Payer: Self-pay | Admitting: Oncology

## 2014-07-15 MED ORDER — ANASTROZOLE 1 MG PO TABS: 1.0000 mg | ORAL_TABLET | Freq: Every day | ORAL | Status: DC

## 2014-07-18 ENCOUNTER — Other Ambulatory Visit: Payer: Self-pay | Admitting: Oncology

## 2014-07-19 ENCOUNTER — Telehealth: Payer: Self-pay | Admitting: Oncology

## 2014-07-19 NOTE — Telephone Encounter (Signed)
, °

## 2014-07-24 ENCOUNTER — Ambulatory Visit: Payer: Federal, State, Local not specified - PPO

## 2014-07-24 ENCOUNTER — Other Ambulatory Visit: Payer: Federal, State, Local not specified - PPO

## 2014-07-25 ENCOUNTER — Other Ambulatory Visit: Payer: Self-pay | Admitting: *Deleted

## 2014-07-25 ENCOUNTER — Ambulatory Visit (HOSPITAL_BASED_OUTPATIENT_CLINIC_OR_DEPARTMENT_OTHER): Payer: Federal, State, Local not specified - PPO

## 2014-07-25 ENCOUNTER — Other Ambulatory Visit (HOSPITAL_BASED_OUTPATIENT_CLINIC_OR_DEPARTMENT_OTHER): Payer: Federal, State, Local not specified - PPO

## 2014-07-25 DIAGNOSIS — C50412 Malignant neoplasm of upper-outer quadrant of left female breast: Secondary | ICD-10-CM

## 2014-07-25 DIAGNOSIS — Z5112 Encounter for antineoplastic immunotherapy: Secondary | ICD-10-CM

## 2014-07-25 LAB — CBC WITH DIFFERENTIAL/PLATELET
BASO%: 0.8 % (ref 0.0–2.0)
BASOS ABS: 0 10*3/uL (ref 0.0–0.1)
EOS%: 3.8 % (ref 0.0–7.0)
Eosinophils Absolute: 0.2 10*3/uL (ref 0.0–0.5)
HCT: 39.2 % (ref 34.8–46.6)
HEMOGLOBIN: 12.4 g/dL (ref 11.6–15.9)
LYMPH%: 23.3 % (ref 14.0–49.7)
MCH: 28.3 pg (ref 25.1–34.0)
MCHC: 31.6 g/dL (ref 31.5–36.0)
MCV: 89.6 fL (ref 79.5–101.0)
MONO#: 0.5 10*3/uL (ref 0.1–0.9)
MONO%: 8.2 % (ref 0.0–14.0)
NEUT#: 3.6 10*3/uL (ref 1.5–6.5)
NEUT%: 63.9 % (ref 38.4–76.8)
PLATELETS: 267 10*3/uL (ref 145–400)
RBC: 4.37 10*6/uL (ref 3.70–5.45)
RDW: 15.5 % — ABNORMAL HIGH (ref 11.2–14.5)
WBC: 5.6 10*3/uL (ref 3.9–10.3)
lymph#: 1.3 10*3/uL (ref 0.9–3.3)

## 2014-07-25 LAB — COMPREHENSIVE METABOLIC PANEL
ALT: 49 U/L — ABNORMAL HIGH (ref 0–35)
AST: 35 U/L (ref 0–37)
Albumin: 3.9 g/dL (ref 3.5–5.2)
Alkaline Phosphatase: 104 U/L (ref 39–117)
BUN: 11 mg/dL (ref 6–23)
CALCIUM: 9.9 mg/dL (ref 8.4–10.5)
CHLORIDE: 104 meq/L (ref 96–112)
CO2: 27 mEq/L (ref 19–32)
CREATININE: 0.54 mg/dL (ref 0.50–1.10)
Glucose, Bld: 103 mg/dL — ABNORMAL HIGH (ref 70–99)
Potassium: 4.1 mEq/L (ref 3.5–5.3)
Sodium: 144 mEq/L (ref 135–145)
Total Protein: 7.5 g/dL (ref 6.0–8.3)

## 2014-07-25 MED ORDER — ACETAMINOPHEN 325 MG PO TABS
ORAL_TABLET | ORAL | Status: AC
  Filled 2014-07-25: qty 2

## 2014-07-25 MED ORDER — SODIUM CHLORIDE 0.9 % IV SOLN
6.0000 mg/kg | Freq: Once | INTRAVENOUS | Status: AC
  Administered 2014-07-25: 504 mg via INTRAVENOUS
  Filled 2014-07-25: qty 24

## 2014-07-25 MED ORDER — ACETAMINOPHEN 325 MG PO TABS
650.0000 mg | ORAL_TABLET | Freq: Once | ORAL | Status: AC
  Administered 2014-07-25: 650 mg via ORAL

## 2014-07-25 MED ORDER — SODIUM CHLORIDE 0.9 % IJ SOLN
10.0000 mL | INTRAMUSCULAR | Status: DC | PRN
  Administered 2014-07-25: 10 mL
  Filled 2014-07-25: qty 10

## 2014-07-25 MED ORDER — DIPHENHYDRAMINE HCL 25 MG PO CAPS
ORAL_CAPSULE | ORAL | Status: AC
  Filled 2014-07-25: qty 1

## 2014-07-25 MED ORDER — DIPHENHYDRAMINE HCL 25 MG PO CAPS
25.0000 mg | ORAL_CAPSULE | Freq: Once | ORAL | Status: AC
  Administered 2014-07-25: 25 mg via ORAL

## 2014-07-25 MED ORDER — HEPARIN SOD (PORK) LOCK FLUSH 100 UNIT/ML IV SOLN
500.0000 [IU] | Freq: Once | INTRAVENOUS | Status: AC | PRN
  Administered 2014-07-25: 500 [IU]
  Filled 2014-07-25: qty 5

## 2014-07-25 MED ORDER — SODIUM CHLORIDE 0.9 % IV SOLN
Freq: Once | INTRAVENOUS | Status: AC
  Administered 2014-07-25: 13:00:00 via INTRAVENOUS

## 2014-07-25 NOTE — Patient Instructions (Signed)
[facility] Discharge Instructions for Patients Receiving Chemotherapy  Today you received the following chemotherapy agents Herceptin.  To help prevent nausea and vomiting after your treatment, we encourage you to take your nausea medication as directed.   If you develop nausea and vomiting that is not controlled by your nausea medication, call the clinic.   BELOW ARE SYMPTOMS THAT SHOULD BE REPORTED IMMEDIATELY:  *FEVER GREATER THAN 100.5 F  *CHILLS WITH OR WITHOUT FEVER  NAUSEA AND VOMITING THAT IS NOT CONTROLLED WITH YOUR NAUSEA MEDICATION  *UNUSUAL SHORTNESS OF BREATH  *UNUSUAL BRUISING OR BLEEDING  TENDERNESS IN MOUTH AND THROAT WITH OR WITHOUT PRESENCE OF ULCERS  *URINARY PROBLEMS  *BOWEL PROBLEMS  UNUSUAL RASH Items with * indicate a potential emergency and should be followed up as soon as possible.  Feel free to call the clinic you have any questions or concerns. The clinic phone number is [phone].  

## 2014-07-29 ENCOUNTER — Ambulatory Visit
Admission: RE | Admit: 2014-07-29 | Discharge: 2014-07-29 | Disposition: A | Discharge status: Home / Self Care | Payer: Federal, State, Local not specified - PPO | Source: Ambulatory Visit | Attending: Oncology | Admitting: Oncology

## 2014-07-29 DIAGNOSIS — K760 Fatty (change of) liver, not elsewhere classified: Secondary | ICD-10-CM

## 2014-07-29 DIAGNOSIS — C50412 Malignant neoplasm of upper-outer quadrant of left female breast: Secondary | ICD-10-CM

## 2014-07-29 DIAGNOSIS — C50912 Malignant neoplasm of unspecified site of left female breast: Secondary | ICD-10-CM

## 2014-08-13 ENCOUNTER — Other Ambulatory Visit: Payer: Self-pay | Admitting: *Deleted

## 2014-08-13 DIAGNOSIS — C50412 Malignant neoplasm of upper-outer quadrant of left female breast: Secondary | ICD-10-CM

## 2014-08-14 ENCOUNTER — Ambulatory Visit (HOSPITAL_BASED_OUTPATIENT_CLINIC_OR_DEPARTMENT_OTHER): Payer: Federal, State, Local not specified - PPO

## 2014-08-14 ENCOUNTER — Other Ambulatory Visit (HOSPITAL_BASED_OUTPATIENT_CLINIC_OR_DEPARTMENT_OTHER): Payer: Federal, State, Local not specified - PPO

## 2014-08-14 ENCOUNTER — Ambulatory Visit (HOSPITAL_BASED_OUTPATIENT_CLINIC_OR_DEPARTMENT_OTHER): Payer: Federal, State, Local not specified - PPO | Admitting: Oncology

## 2014-08-14 ENCOUNTER — Telehealth: Payer: Self-pay | Admitting: Oncology

## 2014-08-14 VITALS — BP 145/72 | HR 60 | Temp 98.5°F | Resp 18 | Ht 67.0 in | Wt 179.4 lb

## 2014-08-14 DIAGNOSIS — C50412 Malignant neoplasm of upper-outer quadrant of left female breast: Secondary | ICD-10-CM

## 2014-08-14 DIAGNOSIS — R51 Headache: Secondary | ICD-10-CM

## 2014-08-14 DIAGNOSIS — Z170 Estrogen receptor positive status [ER+]: Secondary | ICD-10-CM

## 2014-08-14 DIAGNOSIS — Z5112 Encounter for antineoplastic immunotherapy: Secondary | ICD-10-CM

## 2014-08-14 DIAGNOSIS — C50912 Malignant neoplasm of unspecified site of left female breast: Secondary | ICD-10-CM

## 2014-08-14 LAB — COMPREHENSIVE METABOLIC PANEL (CC13)
ALBUMIN: 4 g/dL (ref 3.5–5.0)
ALT: 65 U/L — ABNORMAL HIGH (ref 0–55)
ANION GAP: 10 meq/L (ref 3–11)
AST: 39 U/L — ABNORMAL HIGH (ref 5–34)
Alkaline Phosphatase: 101 U/L (ref 40–150)
BUN: 10.7 mg/dL (ref 7.0–26.0)
CHLORIDE: 106 meq/L (ref 98–109)
CO2: 25 meq/L (ref 22–29)
Calcium: 9.8 mg/dL (ref 8.4–10.4)
Creatinine: 0.7 mg/dL (ref 0.6–1.1)
GLUCOSE: 93 mg/dL (ref 70–140)
POTASSIUM: 3.8 meq/L (ref 3.5–5.1)
SODIUM: 142 meq/L (ref 136–145)
TOTAL PROTEIN: 6.8 g/dL (ref 6.4–8.3)
Total Bilirubin: 0.41 mg/dL (ref 0.20–1.20)

## 2014-08-14 LAB — CBC WITH DIFFERENTIAL/PLATELET
BASO%: 0.4 % (ref 0.0–2.0)
Basophils Absolute: 0 10*3/uL (ref 0.0–0.1)
EOS ABS: 0.3 10*3/uL (ref 0.0–0.5)
EOS%: 4.6 % (ref 0.0–7.0)
HCT: 36.6 % (ref 34.8–46.6)
HGB: 12.2 g/dL (ref 11.6–15.9)
LYMPH#: 1.3 10*3/uL (ref 0.9–3.3)
LYMPH%: 24.1 % (ref 14.0–49.7)
MCH: 29.9 pg (ref 25.1–34.0)
MCHC: 33.3 g/dL (ref 31.5–36.0)
MCV: 89.7 fL (ref 79.5–101.0)
MONO#: 0.5 10*3/uL (ref 0.1–0.9)
MONO%: 8.9 % (ref 0.0–14.0)
NEUT%: 62 % (ref 38.4–76.8)
NEUTROS ABS: 3.3 10*3/uL (ref 1.5–6.5)
Platelets: 271 10*3/uL (ref 145–400)
RBC: 4.08 10*6/uL (ref 3.70–5.45)
RDW: 14.5 % (ref 11.2–14.5)
WBC: 5.4 10*3/uL (ref 3.9–10.3)

## 2014-08-14 MED ORDER — LIDOCAINE-PRILOCAINE 2.5-2.5 % EX CREA: 1.0000 "application " | TOPICAL_CREAM | CUTANEOUS | Status: DC | PRN

## 2014-08-14 MED ORDER — HEPARIN SOD (PORK) LOCK FLUSH 100 UNIT/ML IV SOLN
500.0000 [IU] | Freq: Once | INTRAVENOUS | Status: AC | PRN
  Administered 2014-08-14: 500 [IU]
  Filled 2014-08-14: qty 5

## 2014-08-14 MED ORDER — DIPHENHYDRAMINE HCL 25 MG PO CAPS
25.0000 mg | ORAL_CAPSULE | Freq: Once | ORAL | Status: AC
  Administered 2014-08-14: 25 mg via ORAL

## 2014-08-14 MED ORDER — TRASTUZUMAB CHEMO INJECTION 440 MG
6.0000 mg/kg | Freq: Once | INTRAVENOUS | Status: AC
  Administered 2014-08-14: 504 mg via INTRAVENOUS
  Filled 2014-08-14: qty 24

## 2014-08-14 MED ORDER — SODIUM CHLORIDE 0.9 % IV SOLN
Freq: Once | INTRAVENOUS | Status: AC
  Administered 2014-08-14: 13:00:00 via INTRAVENOUS

## 2014-08-14 MED ORDER — ACETAMINOPHEN 325 MG PO TABS
ORAL_TABLET | ORAL | Status: AC
  Filled 2014-08-14: qty 2

## 2014-08-14 MED ORDER — DIPHENHYDRAMINE HCL 25 MG PO CAPS
ORAL_CAPSULE | ORAL | Status: AC
  Filled 2014-08-14: qty 1

## 2014-08-14 MED ORDER — ACETAMINOPHEN 325 MG PO TABS
650.0000 mg | ORAL_TABLET | Freq: Once | ORAL | Status: AC
  Administered 2014-08-14: 650 mg via ORAL

## 2014-08-14 MED ORDER — SODIUM CHLORIDE 0.9 % IJ SOLN
10.0000 mL | INTRAMUSCULAR | Status: DC | PRN
  Administered 2014-08-14: 10 mL
  Filled 2014-08-14: qty 10

## 2014-08-14 NOTE — Patient Instructions (Signed)
[facility] Discharge Instructions for Patients Receiving Chemotherapy  Today you received the following chemotherapy agents HERCEPTIN  To help prevent nausea and vomiting after your treatment, we encourage you to take your nausea medication IF NEEDED.   If you develop nausea and vomiting that is not controlled by your nausea medication, call the clinic.   BELOW ARE SYMPTOMS THAT SHOULD BE REPORTED IMMEDIATELY:  *FEVER GREATER THAN 100.5 F  *CHILLS WITH OR WITHOUT FEVER  NAUSEA AND VOMITING THAT IS NOT CONTROLLED WITH YOUR NAUSEA MEDICATION  *UNUSUAL SHORTNESS OF BREATH  *UNUSUAL BRUISING OR BLEEDING  TENDERNESS IN MOUTH AND THROAT WITH OR WITHOUT PRESENCE OF ULCERS  *URINARY PROBLEMS  *BOWEL PROBLEMS  UNUSUAL RASH Items with * indicate a potential emergency and should be followed up as soon as possible.  Feel free to call the clinic you have any questions or concerns. The clinic phone number is [phone].    

## 2014-08-14 NOTE — Telephone Encounter (Signed)
per pof to sch pt appt-sent MW email to sch pt trmt-will call pt once reply °

## 2014-08-14 NOTE — Progress Notes (Signed)
Kelsey Mclaughlin  [phone] [phone]     ID: Kelsey Mclaughlin OB: 10/20/1960  MR#: [id number]  IBB#:[id number]  PCP: Default, Provider, MD GYN:   SU: Kelsey Mesa MD OTHER MD: Kelsey Limbo MD, Kelsey Grant MD  CHIEF COMPLAINT: Breast cancer, recurrent, estrogen receptor positive CURRENT TREATMENT: anti HER-2 immunotherapy; to start anti-estrogen therapy  BREAST CANCER HISTORY: From doctor Kelsey Mclaughlin's intake note 45/11/8880:  "Kelsey Mclaughlin is a 52 y.o. female. In 1998 at the age of 70 was diagnosed with left breast cancer in [place]. This was an invasive carcinoma grade 2 stage II. At that time she underwent a lumpectomy with excellent lymph node dissection. She received 6 months of chemotherapy. [name] She also had radiation therapy to the left breast. 2011 patient noticed a lump in the outside of her left breast. She had ultrasound workup performed and was told it was negative no biopsies were performed. General 2014 after patient moved to the [place] she had a mammogram performed this revealed a possible mass in the outer quadrant of the left breast. Ultrasound showed it to be 1.7 cm. MRI of the breasts performed on January 30 revealed the mass to be 1.5 x 2.2 x 1.5 cm. Because of this she also had a biopsy performed on 10/04/2013. The biopsy revealed [[id number]) an invasive ductal carcinoma with ductal carcinoma in situ grade 2. Prognostic panel was positive for estrogen receptor 100% megestrol receptor +33% proliferation marker Ki-67 15% and the tumor was HER-2/neu positive" [with a signals ratio of 3.1 and number per cell 6.3]  The patient's subsequent history is as detailed below  INTERVAL HISTORY: Kelsey Mclaughlin returns today for followup of her breast cancer accompanied by her spouse Kelsey Mclaughlin. She started anastrozole approximately a month ago. So far she has not had  any more hot flashes or problems with vaginal dryness than before. She has no arthralgias or myalgias. They are not sure how much they will have to pay for it but I have advised them if it is more than 10 or $12 a month when they started a new insurance year they will let us know  REVIEW OF SYSTEMS: Kelsey Mclaughlin is having some headaches most days. They're relatively mild. She takes Tylenol for this. She has not mentioned it to [place] because Kelsey Mclaughlin herself just had some cervical disc problems (she is wearing a soft collar today). There have been no visual changes, nausea, vomiting, gait imbalance or stiff neck. They have also been no symptoms suggestive of congestive heart failure. A detailed review of systems today was benign except as noted  PAST MEDICAL HISTORY: Past Medical History  Diagnosis Date  . Cancer 1998, 2015    breast  . Bronchitis     completed antibiotics 10/21/13/ states resolved  . Stroke 2004    TIA-  no problems since  . Hypertension     no meds  . Lymph edema     lt arm    PAST SURGICAL HISTORY: Past Surgical History  Procedure Laterality Date  . Breast surgery  1998    lumpectomy on left and removed lymphnodes in [place]  . Portacath placement Right 10/25/2013    Procedure: INSERTION PORT-A-CATH;  Surgeon: Imogene Burn. Tsuei, MD;  Location: [facility];  Service: General;  Laterality: Right;  . Simple mastectomy with axillary sentinel node biopsy Left 04/12/2014    Procedure: MASTECTOMY;  Surgeon: Imogene Burn. Tsuei,  MD;  Location: [facility];  Service: General;  Laterality: Left;  . Latissimus flap to breast Left 04/12/2014    Procedure: LEFT LATISSIMUS DORSI FLAP FOR LEFT BREAST RECONSTRUCTION AND PLACEMENT OF TISSUE EXPANDER;  Surgeon: Kelsey Limbo, MD;  Location: [facility];  Service: Plastics;  Laterality: Left;  . Tissue expander placement Left 04/12/2014    Procedure: TISSUE EXPANDER;  Surgeon: Kelsey Limbo, MD;  Location: [facility];  Service: Plastics;  Laterality: Left;  . Tissue  expander placement Left 05/22/2014    Procedure: PLACEMENT OF TISSUE EXPANDER/I&D WASHOUT SEROMA;  Surgeon: Kelsey Limbo, MD;  Location: [facility];  Service: Plastics;  Laterality: Left;    FAMILY HISTORY Family History  Problem Relation Age of Onset  . Breast cancer Mother 78  . Heart disease Father   . Heart attack Father   . Breast cancer Maternal Grandmother 23  . Brain cancer Maternal Uncle 74    benign brain tumor  . Heart attack Paternal Aunt     GYNECOLOGIC HISTORY:   menarche age 74, first live birth age 81. The patient is GX P2. She went through the change of life approximately 2011.   SOCIAL HISTORY:  The patient worked previously as a Marine scientist. She has 2 children both of whom live in [place], works for the [employer], and [employer], a Librarian, academic. They are in their early 35s. There are no grandchildren. The patient married Kelsey Mclaughlin (both women are originally from [place]) in [place]. [place] with 5 dogs, 10 chickens and 2 courses. They're not church attenders    ADVANCED DIRECTIVES:    HEALTH MAINTENANCE: History  Substance Use Topics  . Smoking status: Never Smoker   . Smokeless tobacco: Never Used  . Alcohol Use: Yes     Comment: 2 drinks/day     Colonoscopy:  PAP:  Bone density:  Lipid panel:  Allergies  Allergen Reactions  . Adhesive [Tape] Rash    Band-aids    Current Outpatient Prescriptions  Medication Sig Dispense Refill  . anastrozole (ARIMIDEX) 1 MG tablet Take 1 tablet (1 mg total) by mouth daily. 90 tablet 4  . HYDROcodone-acetaminophen (NORCO) 10-325 MG per tablet Take 1 tablet by mouth every 4 (four) hours as needed for moderate pain.    Marland Kitchen HYDROcodone-acetaminophen (NORCO) 10-325 MG per tablet Take 1 tablet by mouth every 4 (four) hours as needed. 40 tablet 0  . lidocaine-prilocaine (EMLA) cream Apply 1 application topically as needed (over port).     . senna-docusate (SENOKOT-S) 8.6-50 MG per  tablet Take 1 tablet by mouth daily. 20 tablet 0  . sulfamethoxazole-trimethoprim (BACTRIM DS) 800-160 MG per tablet Take 1 tablet by mouth 2 (two) times daily. 14 tablet 0  . trastuzumab (HERCEPTIN) 440 MG injection Inject 504 mg into the vein once. For 30 mins    . valACYclovir (VALTREX) 1000 MG tablet Take 2 tablets (2,000 mg total) by mouth once. 4 tablet 0   No current facility-administered medications for this visit.    OBJECTIVE: middle-aged white woman who appears well Filed Vitals:   08/14/14 1134  BP: 145/72  Pulse: 60  Temp: 98.5 F (36.9 C)  Resp: 18     Body mass index is 28.09 kg/(m^2).      ECOG FS:1 - Symptomatic but completely ambulatory  Ocular: Sclerae unicteric, EOMs intact Ear-nose-throat: Oropharynx clear and moist Lymphatic: No cervical or supraclavicular adenopathy Lungs no rales or rhonchi Heart regular rate and rhythm  Abd soft, nontender, positive bowel sounds MSK no focal spinal tenderness,  grade 1 left upper extremity lymphedema, unchanged Neuro: non-focal, well-oriented, positive affect Breasts: The right breast is unremarkable. The left breast is status post mastectomy with expander in place and latissimus flap reconstruction. There continues to be some duskiness of over the skin, but no evidence of dehiscence or local recurrence. The left axilla is benign.   LAB RESULTS:  CMP     Component Value Date/Time   NA 144 07/25/2014 1204   NA 143 07/05/2014 1219   K 4.1 07/25/2014 1204   K 4.4 07/05/2014 1219   CL 104 07/25/2014 1204   CO2 27 07/25/2014 1204   CO2 27 07/05/2014 1219   GLUCOSE 103* 07/25/2014 1204   GLUCOSE 110 07/05/2014 1219   BUN 11 07/25/2014 1204   BUN 12.6 07/05/2014 1219   CREATININE 0.54 07/25/2014 1204   CREATININE 0.7 07/05/2014 1219   CALCIUM 9.9 07/25/2014 1204   CALCIUM 9.9 07/05/2014 1219   PROT 7.5 07/25/2014 1204   PROT 6.7 07/05/2014 1219   ALBUMIN 3.9 07/25/2014 1204   ALBUMIN 3.7 07/05/2014 1219   AST 35  07/25/2014 1204   AST 39* 07/05/2014 1219   ALT 49* 07/25/2014 1204   ALT 71* 07/05/2014 1219   ALKPHOS 104 07/25/2014 1204   ALKPHOS 99 07/05/2014 1219   BILITOT <0.2* 07/25/2014 1204   BILITOT 0.26 07/05/2014 1219   GFRNONAA >90 04/13/2014 0430   GFRAA >90 04/13/2014 0430    I No results found for: SPEP  Lab Results  Component Value Date   WBC 5.4 08/14/2014   NEUTROABS 3.3 08/14/2014   HGB 12.2 08/14/2014   HCT 36.6 08/14/2014   MCV 89.7 08/14/2014   PLT 271 08/14/2014      Chemistry      Component Value Date/Time   NA 144 07/25/2014 1204   NA 143 07/05/2014 1219   K 4.1 07/25/2014 1204   K 4.4 07/05/2014 1219   CL 104 07/25/2014 1204   CO2 27 07/25/2014 1204   CO2 27 07/05/2014 1219   BUN 11 07/25/2014 1204   BUN 12.6 07/05/2014 1219   CREATININE 0.54 07/25/2014 1204   CREATININE 0.7 07/05/2014 1219      Component Value Date/Time   CALCIUM 9.9 07/25/2014 1204   CALCIUM 9.9 07/05/2014 1219   ALKPHOS 104 07/25/2014 1204   ALKPHOS 99 07/05/2014 1219   AST 35 07/25/2014 1204   AST 39* 07/05/2014 1219   ALT 49* 07/25/2014 1204   ALT 71* 07/05/2014 1219   BILITOT <0.2* 07/25/2014 1204   BILITOT 0.26 07/05/2014 1219       No results found for: LABCA2  No components found for: LABCA125  No results for input(s): INR in the last 168 hours.  Urinalysis    Component Value Date/Time   LABSPEC 1.005 05/22/2014 1130   GLUCOSEU Negative 05/22/2014 1130   UROBILINOGEN 0.2 05/22/2014 1130    STUDIES: CLINICAL DATA: Postmenopausal.  EXAM: DUAL X-RAY ABSORPTIOMETRY (DXA) FOR BONE MINERAL DENSITY  FINDINGS: AP LUMBAR SPINE (L1-L4)  Bone Mineral Density (BMD): 0.920 g/cm2  Young Adult T-Score: -1.2  Z-Score: -0.2  LEFT FEMUR NECK  Bone Mineral Density (BMD): 0.755 g/cm2  Young Adult T-Score: -0.8  Z-Score: 0.1  ASSESSMENT: Patient's diagnostic category is LOW BONE MASS by WHO Criteria.  FRACTURE RISK: MODERATE  FRAX:  Based on the Morrill model, the 10 year probability of a major osteoporotic fracture is 4.8%. The  10 year probability of a hip fracture is 0.2%.  COMPARISON: None.   ASSESSMENT: 53 y.o. BRCA negative [place] woman, [place] speaker   (1) status post left lumpectomy and axillary lymph node dissection in 1998 in [place] for a stage II invasive ductal carcinoma, grade 2, treated with adjuvant chemotherapy (specific drugs not clear) and adjuvant radiation.  (2) status post left breast upper outer quadrant biopsy 10/04/2013 for a clinically mT2 N0, stage IIA invasive ductal carcinoma, grade 2, estrogen receptor 100% positive, progesterone receptor 33% positive, with an MIB-1 of 15%, and HER-2 amplified; staging studies showed no evidence of metastatic disease  (3) started on neoadjuvant carboplatin/ docetaxel/ trastuzumab/ pertuzumab 11/12/2013  (a) completed 4 cycles as of 05/06/201, after which docetaxel was discontinued because of neuropathy symptoms   (b) gemcitabine was given day 1 and day 8 with carboplatin day 1 in the final 2 cycles, completed 03/04/2014   (c) continues on trastuzumab every 21 days (through March 2016)-- most recent echo 06/28/2014 shows a normal EF  (4) left mastectomy  04/12/2014 showed residual mpT1c pNX invasive ductal carcinoma, grade 2, estrogen and progesterone receptor positive, HER-2 negative, with negative margins  (5) anastrozole started 07/14/2014  (a) bone density 07/29/2014 shows T score - 1.2 (mild osteopenia)  (6) 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   PLAN: Gerard is tolerating the anastrozole remarkably well so far. She also has a very favorable baseline bone density. I have asked her to start taking vitamin D on a daily basis. Kelsey Mclaughlin could do the same.  I am not sure why Bailee is having headaches every day. Most likely this is, to be allergy related, but she does not have  sinus symptoms at present. I am going to obtain a brain MRI just to be 100% sure there is nothing to worry about. Hopefully week and get this done within the next few days.  She will have her next echocardiogram in January. She will see me again in mid March and that will be the time of her last Herceptin dose. Robyn has a good understanding of the overall plan. She agrees with it. She knows the goal of treatment in her case is cure. She will call with any problems that may develop before her next visit here.  [name], MD   08/14/2014 11:54 AM

## 2014-08-15 ENCOUNTER — Telehealth: Payer: Self-pay | Admitting: *Deleted

## 2014-08-15 NOTE — Telephone Encounter (Signed)
Per staff message and P[facility] I have scheduled appts. Advised scheduler of appts. JMW  

## 2014-08-16 ENCOUNTER — Telehealth: Payer: Self-pay | Admitting: Oncology

## 2014-08-16 NOTE — Telephone Encounter (Signed)
per pof to sch pt appt-MW sch trmts-per pt req to mail copy of sch-mailed

## 2014-08-26 ENCOUNTER — Ambulatory Visit (HOSPITAL_COMMUNITY)
Admission: RE | Admit: 2014-08-26 | Discharge: 2014-08-26 | Disposition: A | Discharge status: Home / Self Care | Payer: Federal, State, Local not specified - PPO | Source: Ambulatory Visit | Attending: Oncology | Admitting: Oncology

## 2014-08-26 DIAGNOSIS — C50912 Malignant neoplasm of unspecified site of left female breast: Secondary | ICD-10-CM

## 2014-08-27 ENCOUNTER — Other Ambulatory Visit: Payer: Self-pay | Admitting: Oncology

## 2014-08-28 NOTE — H&P (Signed)
  Subjective:    Patient ID: Kelsey Mclaughlin is a 53 y.o. female.  HPI Nearly 5 months post op from left mastectomy for recurrent breast cancer with immediate latissimus and TE reconstruction. She is also 3 months post return to OR for seroma left breast with cellulitis. Underwent exchange TE and placement drain. Culture with S aureus. Plan for second stage reconstruction.  Dr. Jana Hakim had ordered MRI for c/o frequent HA, however this is not able to be done due to tissue expander. Plan to proceed with surgery and reschedule MRI to post surgery.   Final pathology with two foci IDC 1.3 and 1.0 cm, margins negative. +lymphovascular invasion. Plan for AI as adjuvant treatment. The patient was originally diagnosed at age 47 with invasive ductal carcinoma in the left upper outer quadrant near the axilla. The patient was in [place] and underwent lumpectomy and full axillary lymph node dissection with ALND, XRT, chemotherapy. 2/26 lymph nodes positive Screening [facility] in December 2014 demonstrated breast asymmetry and follow up biospy demonstrated mass measuring 1 x 0.4 x 1.7 cm 2 cm from the nipple. , IDC, ER/PR +, Her 2 +. She completed neoadjuvant chemotherapy. 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 She has lymphedema of LUE and uses sleeve and compression pump.   Prior bra C cup on larger R breast side. Desires this or larger.  Review of Systems     Objective:    Physical Exam  Cardiovascular: Normal rate and regular rhythm.  Pulmonary/Chest: Effort normal and breath sounds normal.    SN to nipple R 26. 5 cm  BW R 15 cm L 14 cm  Desired height appr on left 13-14 cm Nipple to IMF R 10 cm  Left reconstructed breast soft, depression superior pole R breast without masses, grade 1 ptosis  Assessment:      Recurrent left breast cancer Post mastectomy with latissimus/TE reconstruction     Plan:   Plan implant exchange, lipofilling to left chest to address superior pole. Also plan right mastopexy  for lift. Counseled that implant breast will always have more upper pole fullness. Discussed I am hesitant to add implant to right breast as she is already C cup and will be hard to get larger that this on reconstructed breast. After further discussion and reviewing of anatomic vs round implants, Plan round silicone implant on left. Plan overnight stay. Discussed drain on left, she is agreeable to new IMF incision if needed. Reviewed incision, binder (has old garment as well) abdomen, and variable take graft and need for repeating lipofilling in future. Risks including but not limited to bleeding, seroma, wound healing problems, extrusion, capsular contracture, implant rupture, DVT/PE, damage to deeper structures reviewed.  Mentor Style 9200 350 ml tissue expander Fill volume 355 ml   Irene Limbo, MD [facility] [phone]

## 2014-08-29 ENCOUNTER — Encounter (HOSPITAL_BASED_OUTPATIENT_CLINIC_OR_DEPARTMENT_OTHER): Payer: Self-pay | Admitting: *Deleted

## 2014-08-29 NOTE — Progress Notes (Signed)
Talked with legal spouse-she does not want interpreter, last german interpreter did not speak very well-they will sign a refusal Labs done 08/14/14 and were good-did not need more since with in 30 days

## 2014-09-03 ENCOUNTER — Ambulatory Visit (HOSPITAL_BASED_OUTPATIENT_CLINIC_OR_DEPARTMENT_OTHER): Payer: Federal, State, Local not specified - PPO | Admitting: Certified Registered"

## 2014-09-03 ENCOUNTER — Ambulatory Visit (HOSPITAL_BASED_OUTPATIENT_CLINIC_OR_DEPARTMENT_OTHER)
Admission: RE | Admit: 2014-09-03 | Discharge: 2014-09-03 | Disposition: A | Discharge status: Home / Self Care | Payer: Federal, State, Local not specified - PPO | Source: Ambulatory Visit | Attending: Plastic Surgery | Admitting: Plastic Surgery

## 2014-09-03 ENCOUNTER — Other Ambulatory Visit: Payer: Self-pay | Admitting: Emergency Medicine

## 2014-09-03 ENCOUNTER — Encounter (HOSPITAL_BASED_OUTPATIENT_CLINIC_OR_DEPARTMENT_OTHER): Payer: Self-pay | Admitting: Certified Registered"

## 2014-09-03 ENCOUNTER — Encounter (HOSPITAL_BASED_OUTPATIENT_CLINIC_OR_DEPARTMENT_OTHER)
Admission: RE | Disposition: A | Discharge status: Home / Self Care | Payer: Self-pay | Source: Ambulatory Visit | Attending: Plastic Surgery

## 2014-09-03 DIAGNOSIS — I10 Essential (primary) hypertension: Secondary | ICD-10-CM | POA: Diagnosis not present

## 2014-09-03 DIAGNOSIS — Z8673 Personal history of transient ischemic attack (TIA), and cerebral infarction without residual deficits: Secondary | ICD-10-CM | POA: Insufficient documentation

## 2014-09-03 DIAGNOSIS — C50412 Malignant neoplasm of upper-outer quadrant of left female breast: Secondary | ICD-10-CM

## 2014-09-03 DIAGNOSIS — Z9012 Acquired absence of left breast and nipple: Secondary | ICD-10-CM | POA: Diagnosis present

## 2014-09-03 DIAGNOSIS — Z853 Personal history of malignant neoplasm of breast: Secondary | ICD-10-CM | POA: Diagnosis not present

## 2014-09-03 DIAGNOSIS — J449 Chronic obstructive pulmonary disease, unspecified: Secondary | ICD-10-CM | POA: Diagnosis not present

## 2014-09-03 HISTORY — PX: LIPOSUCTION WITH LIPOFILLING: SHX6436

## 2014-09-03 HISTORY — PX: BREAST IMPLANT EXCHANGE: SHX6296

## 2014-09-03 HISTORY — PX: MASTOPEXY: SHX5358

## 2014-09-03 SURGERY — REPLACEMENT, IMPLANT, BREAST
Anesthesia: General | Site: Chest | Laterality: Right

## 2014-09-03 MED ORDER — LIDOCAINE-EPINEPHRINE 1 %-1:100000 IJ SOLN
INTRAMUSCULAR | Status: AC
  Filled 2014-09-03: qty 1

## 2014-09-03 MED ORDER — KETOROLAC TROMETHAMINE 30 MG/ML IJ SOLN
INTRAMUSCULAR | Status: DC | PRN
  Administered 2014-09-03: 30 mg via INTRAVENOUS

## 2014-09-03 MED ORDER — FENTANYL CITRATE 0.05 MG/ML IJ SOLN: 50.0000 ug | INTRAMUSCULAR | Status: DC | PRN

## 2014-09-03 MED ORDER — CEFAZOLIN SODIUM-DEXTROSE 2-3 GM-% IV SOLR
2.0000 g | INTRAVENOUS | Status: DC
Start: 2014-09-03 — End: 2014-09-03

## 2014-09-03 MED ORDER — SODIUM BICARBONATE 4 % IV SOLN
INTRAVENOUS | Status: DC | PRN
  Administered 2014-09-03: 550 mL via INTRAMUSCULAR

## 2014-09-03 MED ORDER — DEXAMETHASONE SODIUM PHOSPHATE 4 MG/ML IJ SOLN
INTRAMUSCULAR | Status: DC | PRN
  Administered 2014-09-03: 10 mg via INTRAVENOUS

## 2014-09-03 MED ORDER — SULFAMETHOXAZOLE-TRIMETHOPRIM 800-160 MG PO TABS: 1.0000 | ORAL_TABLET | Freq: Two times a day (BID) | ORAL | Status: DC

## 2014-09-03 MED ORDER — CHLORHEXIDINE GLUCONATE 4 % EX LIQD: 1.0000 "application " | Freq: Once | CUTANEOUS | Status: DC

## 2014-09-03 MED ORDER — ROCURONIUM BROMIDE 100 MG/10ML IV SOLN
INTRAVENOUS | Status: DC | PRN
  Administered 2014-09-03: 20 mg via INTRAVENOUS

## 2014-09-03 MED ORDER — LACTATED RINGERS IV SOLN
INTRAVENOUS | Status: DC
  Administered 2014-09-03 (×3): via INTRAVENOUS

## 2014-09-03 MED ORDER — HYDROMORPHONE HCL 1 MG/ML IJ SOLN
INTRAMUSCULAR | Status: AC
  Filled 2014-09-03: qty 1

## 2014-09-03 MED ORDER — HYDROMORPHONE HCL 1 MG/ML IJ SOLN
0.2500 mg | INTRAMUSCULAR | Status: DC | PRN
  Administered 2014-09-03 (×3): 0.5 mg via INTRAVENOUS

## 2014-09-03 MED ORDER — SODIUM CHLORIDE 0.9 % IV SOLN
INTRAVENOUS | Status: DC | PRN
  Administered 2014-09-03: 1000 mL

## 2014-09-03 MED ORDER — NEOSTIGMINE METHYLSULFATE 10 MG/10ML IV SOLN
INTRAVENOUS | Status: DC | PRN
  Administered 2014-09-03: 2.5 mg via INTRAVENOUS

## 2014-09-03 MED ORDER — ONDANSETRON HCL 4 MG/2ML IJ SOLN
INTRAMUSCULAR | Status: DC | PRN
  Administered 2014-09-03: 4 mg via INTRAVENOUS

## 2014-09-03 MED ORDER — PROPOFOL 10 MG/ML IV BOLUS
INTRAVENOUS | Status: DC | PRN
  Administered 2014-09-03: 250 mg via INTRAVENOUS

## 2014-09-03 MED ORDER — HYDROCODONE-ACETAMINOPHEN 10-325 MG PO TABS: 1.0000 | ORAL_TABLET | ORAL | Status: DC | PRN

## 2014-09-03 MED ORDER — FENTANYL CITRATE 0.05 MG/ML IJ SOLN
INTRAMUSCULAR | Status: AC
  Filled 2014-09-03: qty 10

## 2014-09-03 MED ORDER — ONDANSETRON HCL 4 MG/2ML IJ SOLN: 4.0000 mg | Freq: Once | INTRAMUSCULAR | Status: DC | PRN

## 2014-09-03 MED ORDER — GLYCOPYRROLATE 0.2 MG/ML IJ SOLN
INTRAMUSCULAR | Status: DC | PRN
  Administered 2014-09-03: .4 mg via INTRAVENOUS
  Administered 2014-09-03 (×2): 0.2 mg via INTRAVENOUS

## 2014-09-03 MED ORDER — LIDOCAINE HCL (CARDIAC) 20 MG/ML IV SOLN
INTRAVENOUS | Status: DC | PRN
  Administered 2014-09-03: 100 mg via INTRAVENOUS

## 2014-09-03 MED ORDER — MIDAZOLAM HCL 2 MG/2ML IJ SOLN: 1.0000 mg | INTRAMUSCULAR | Status: DC | PRN

## 2014-09-03 MED ORDER — MIDAZOLAM HCL 5 MG/5ML IJ SOLN
INTRAMUSCULAR | Status: DC | PRN
  Administered 2014-09-03: 2 mg via INTRAVENOUS

## 2014-09-03 MED ORDER — VANCOMYCIN HCL IN DEXTROSE 1-5 GM/200ML-% IV SOLN
INTRAVENOUS | Status: AC
  Filled 2014-09-03: qty 200

## 2014-09-03 MED ORDER — CEFAZOLIN SODIUM-DEXTROSE 2-3 GM-% IV SOLR
INTRAVENOUS | Status: AC
  Filled 2014-09-03: qty 50

## 2014-09-03 MED ORDER — OXYCODONE HCL 5 MG PO TABS
ORAL_TABLET | ORAL | Status: AC
  Filled 2014-09-03: qty 1

## 2014-09-03 MED ORDER — VANCOMYCIN HCL IN DEXTROSE 1-5 GM/200ML-% IV SOLN
1000.0000 mg | Freq: Once | INTRAVENOUS | Status: AC
  Administered 2014-09-03: 1000 mg via INTRAVENOUS

## 2014-09-03 MED ORDER — LIDOCAINE HCL (PF) 1 % IJ SOLN
INTRAMUSCULAR | Status: AC
  Filled 2014-09-03: qty 30

## 2014-09-03 MED ORDER — MIDAZOLAM HCL 2 MG/2ML IJ SOLN
INTRAMUSCULAR | Status: AC
  Filled 2014-09-03: qty 2

## 2014-09-03 MED ORDER — SUCCINYLCHOLINE CHLORIDE 20 MG/ML IJ SOLN
INTRAMUSCULAR | Status: DC | PRN
  Administered 2014-09-03: 100 mg via INTRAVENOUS

## 2014-09-03 MED ORDER — FENTANYL CITRATE 0.05 MG/ML IJ SOLN
INTRAMUSCULAR | Status: DC | PRN
  Administered 2014-09-03 (×3): 50 ug via INTRAVENOUS
  Administered 2014-09-03: 100 ug via INTRAVENOUS
  Administered 2014-09-03: 50 ug via INTRAVENOUS

## 2014-09-03 MED ORDER — OXYCODONE HCL 5 MG PO TABS
5.0000 mg | ORAL_TABLET | Freq: Once | ORAL | Status: AC
  Administered 2014-09-03: 5 mg via ORAL

## 2014-09-03 SURGICAL SUPPLY — 92 items
BAG DECANTER FOR FLEXI CONT (MISCELLANEOUS) ×4 IMPLANT
BINDER ABDOMINAL 10 UNV 27-48 (MISCELLANEOUS) IMPLANT
BINDER ABDOMINAL 12 SM 30-45 (SOFTGOODS) ×4 IMPLANT
BINDER BREAST LRG (GAUZE/BANDAGES/DRESSINGS) IMPLANT
BINDER BREAST MEDIUM (GAUZE/BANDAGES/DRESSINGS) IMPLANT
BINDER BREAST XLRG (GAUZE/BANDAGES/DRESSINGS) ×4 IMPLANT
BINDER BREAST XXLRG (GAUZE/BANDAGES/DRESSINGS) IMPLANT
BIOPATCH RED 1 DISK 7.0 (GAUZE/BANDAGES/DRESSINGS) IMPLANT
BLADE SURG 10 STRL SS (BLADE) ×8 IMPLANT
BLADE SURG 11 STRL SS (BLADE) IMPLANT
BLADE SURG 15 STRL LF DISP TIS (BLADE) ×3 IMPLANT
BLADE SURG 15 STRL SS (BLADE) ×1
BNDG GAUZE ELAST 4 BULKY (GAUZE/BANDAGES/DRESSINGS) ×8 IMPLANT
CANISTER LIPO FAT HARVEST (MISCELLANEOUS) ×4 IMPLANT
CANISTER SUCT 1200ML W/VALVE (MISCELLANEOUS) IMPLANT
CHLORAPREP W/TINT 26ML (MISCELLANEOUS) ×4 IMPLANT
CONT SPEC 4OZ CLIKSEAL STRL BL (MISCELLANEOUS) ×4 IMPLANT
COVER BACK TABLE 60X90IN (DRAPES) ×4 IMPLANT
COVER MAYO STAND STRL (DRAPES) ×4 IMPLANT
DECANTER SPIKE VIAL GLASS SM (MISCELLANEOUS) IMPLANT
DRAIN CHANNEL 15F RND FF W/TCR (WOUND CARE) ×4 IMPLANT
DRAIN CHANNEL 19F RND (DRAIN) IMPLANT
DRAPE LAPAROSCOPIC ABDOMINAL (DRAPES) ×4 IMPLANT
DRSG PAD ABDOMINAL 8X10 ST (GAUZE/BANDAGES/DRESSINGS) ×8 IMPLANT
DRSG TEGADERM 2-3/8X2-3/4 SM (GAUZE/BANDAGES/DRESSINGS) IMPLANT
ELECT BLADE 4.0 EZ CLEAN MEGAD (MISCELLANEOUS) ×4
ELECT COATED BLADE 2.86 ST (ELECTRODE) ×4 IMPLANT
ELECT REM PT RETURN 9FT ADLT (ELECTROSURGICAL) ×4
ELECTRODE BLDE 4.0 EZ CLN MEGD (MISCELLANEOUS) ×3 IMPLANT
ELECTRODE REM PT RTRN 9FT ADLT (ELECTROSURGICAL) ×3 IMPLANT
EVACUATOR SILICONE 100CC (DRAIN) ×4 IMPLANT
FILTER LIPOSUCTION (MISCELLANEOUS) ×4 IMPLANT
GAUZE SPONGE 4X4 12PLY STRL (GAUZE/BANDAGES/DRESSINGS) IMPLANT
GLOVE BIO SURGEON STRL SZ 6 (GLOVE) ×12 IMPLANT
GLOVE BIO SURGEON STRL SZ 6.5 (GLOVE) IMPLANT
GLOVE BIOGEL PI IND STRL 7.0 (GLOVE) ×3 IMPLANT
GLOVE BIOGEL PI INDICATOR 7.0 (GLOVE) ×1
GLOVE ECLIPSE 6.5 STRL STRAW (GLOVE) ×4 IMPLANT
GOWN STRL REUS W/ TWL LRG LVL3 (GOWN DISPOSABLE) ×6 IMPLANT
GOWN STRL REUS W/TWL LRG LVL3 (GOWN DISPOSABLE) ×2
IMPL BREAST GEL 535CC (Breast) ×3 IMPLANT
IMPLANT BREAST GEL 535CC (Breast) ×4 IMPLANT
IV NS 500ML (IV SOLUTION)
IV NS 500ML BAXH (IV SOLUTION) IMPLANT
KIT FILL SYSTEM UNIVERSAL (SET/KITS/TRAYS/PACK) ×4 IMPLANT
LINER CANISTER 1000CC FLEX (MISCELLANEOUS) ×4 IMPLANT
LIQUID BAND (GAUZE/BANDAGES/DRESSINGS) ×8 IMPLANT
MANIFOLD NEPTUNE II (INSTRUMENTS) ×4 IMPLANT
NDL SAFETY ECLIPSE 18X1.5 (NEEDLE) ×3 IMPLANT
NEEDLE HYPO 18GX1.5 SHARP (NEEDLE) ×1
NEEDLE HYPO 25X1 1.5 SAFETY (NEEDLE) IMPLANT
NS IRRIG 1000ML POUR BTL (IV SOLUTION) ×4 IMPLANT
PACK BASIN DAY SURGERY FS (CUSTOM PROCEDURE TRAY) ×4 IMPLANT
PACK UNIVERSAL I (CUSTOM PROCEDURE TRAY) ×4 IMPLANT
PAD ALCOHOL SWAB (MISCELLANEOUS) ×4 IMPLANT
PENCIL BUTTON HOLSTER BLD 10FT (ELECTRODE) ×4 IMPLANT
PIN SAFETY STERILE (MISCELLANEOUS) ×4 IMPLANT
SIZER BREAST GENERIC (SIZER) ×4 IMPLANT
SIZER BREAST REUSE 535CC (SIZER) ×1
SIZER BREAST REUSE 590CC (SIZER) ×4
SIZER BRST REUSE 590CC (SIZER) ×3 IMPLANT
SIZER BRST REUSE ULT HI 535CC (SIZER) ×3 IMPLANT
SIZER GENERIC MENTOR (SIZER) ×4 IMPLANT
SLEEVE SCD COMPRESS KNEE MED (MISCELLANEOUS) ×4 IMPLANT
SPONGE GAUZE 4X4 12PLY STER LF (GAUZE/BANDAGES/DRESSINGS) IMPLANT
SPONGE LAP 18X18 X RAY DECT (DISPOSABLE) ×16 IMPLANT
STAPLER VISISTAT 35W (STAPLE) ×4 IMPLANT
STRIP CLOSURE SKIN 1/2X4 (GAUZE/BANDAGES/DRESSINGS) IMPLANT
SUT ETHILON 2 0 FS 18 (SUTURE) ×8 IMPLANT
SUT MNCRL AB 4-0 PS2 18 (SUTURE) ×8 IMPLANT
SUT MON AB 3-0 SH 27 (SUTURE)
SUT MON AB 3-0 SH27 (SUTURE) IMPLANT
SUT MON AB 5-0 PS2 18 (SUTURE) IMPLANT
SUT PDS 3-0 CT2 (SUTURE)
SUT PDS AB 2-0 CT2 27 (SUTURE) IMPLANT
SUT PDS II 3-0 CT2 27 ABS (SUTURE) IMPLANT
SUT SILK 3 0 PS 1 (SUTURE) IMPLANT
SUT VIC AB 3-0 PS1 18 (SUTURE) ×2
SUT VIC AB 3-0 PS1 18XBRD (SUTURE) ×6 IMPLANT
SUT VIC AB 3-0 SH 27 (SUTURE) ×3
SUT VIC AB 3-0 SH 27X BRD (SUTURE) ×9 IMPLANT
SUT VICRYL 4-0 PS2 18IN ABS (SUTURE) ×8 IMPLANT
SYR 20CC LL (SYRINGE) ×8 IMPLANT
SYR 50ML LL SCALE MARK (SYRINGE) ×12 IMPLANT
SYR BULB IRRIGATION 50ML (SYRINGE) ×4 IMPLANT
SYR CONTROL 10ML LL (SYRINGE) ×4 IMPLANT
SYR TB 1ML LL NO SAFETY (SYRINGE) IMPLANT
TOWEL OR 17X24 6PK STRL BLUE (TOWEL DISPOSABLE) ×8 IMPLANT
TUBE CONNECTING 20X1/4 (TUBING) ×4 IMPLANT
TUBING SET GRADUATE ASPIR 12FT (MISCELLANEOUS) ×4 IMPLANT
UNDERPAD 30X30 INCONTINENT (UNDERPADS AND DIAPERS) ×8 IMPLANT
YANKAUER SUCT BULB TIP NO VENT (SUCTIONS) ×8 IMPLANT

## 2014-09-03 NOTE — Anesthesia Preprocedure Evaluation (Signed)
Anesthesia Evaluation  Patient identified by MRN, date of birth, ID band Patient awake    Reviewed: Allergy & Precautions, H&P , NPO status , Patient's Chart, lab work & pertinent test results  Airway        Dental   Pulmonary COPD         Cardiovascular hypertension,     Neuro/Psych CVA    GI/Hepatic   Endo/Other    Renal/GU      Musculoskeletal   Abdominal   Peds  Hematology   Anesthesia Other Findings Ca Breast  Reproductive/Obstetrics                             Anesthesia Physical Anesthesia Plan  ASA: III  Anesthesia Plan: General   Post-op Pain Management:    Induction: Intravenous  Airway Management Planned: Oral ETT and LMA  Additional Equipment:   Intra-op Plan:   Post-operative Plan: Extubation in OR  Informed Consent: I have reviewed the patients History and Physical, chart, labs and discussed the procedure including the risks, benefits and alternatives for the proposed anesthesia with the patient or authorized representative who has indicated his/her understanding and acceptance.     Plan Discussed with: CRNA, Anesthesiologist and Surgeon  Anesthesia Plan Comments:         Anesthesia Quick Evaluation

## 2014-09-03 NOTE — Anesthesia Procedure Notes (Signed)
Procedure Name: Intubation Date/Time: 09/03/2014 12:14 PM Performed by: Baxter Flattery Pre-anesthesia Checklist: Patient identified, Emergency Drugs available, Suction available and Patient being monitored Patient Re-evaluated:Patient Re-evaluated prior to inductionOxygen Delivery Method: Circle System Utilized Preoxygenation: Pre-oxygenation with 100% oxygen Intubation Type: IV induction Ventilation: Mask ventilation without difficulty Laryngoscope Size: Miller and 2 Grade View: Grade II Tube type: Oral Tube size: 7.0 mm Number of attempts: 1 Airway Equipment and Method: stylet and LTA kit utilized Placement Confirmation: ETT inserted through vocal cords under direct vision,  positive ETCO2 and breath sounds checked- equal and bilateral Secured at: 22 cm Tube secured with: Tape Dental Injury: Teeth and Oropharynx as per pre-operative assessment

## 2014-09-03 NOTE — Op Note (Signed)
Operative Note   DATE OF OPERATION: 12.22.2015   LOCATION: [facility]-outpatient  SURGICAL DIVISION: Plastic Surgery  PREOPERATIVE DIAGNOSES:  1. History of left breast cancer 2. Acquired absence left breast 3. History radiation to left chest and breast  POSTOPERATIVE DIAGNOSES:  same  PROCEDURE:  1. Removal left breast expander and placement silicone implant 2. Lipofilling to left chest 3. Right breast mastopexy for symmetry.  SURGEON: Irene Limbo MD MBA  ASSISTANT: none  ANESTHESIA:  General.   EBL: 75 ml  COMPLICATIONS: None immediate.   INDICATIONS FOR PROCEDURE:  The patient, Kelsey Mclaughlin, is a 53 y.o. female born on [birth year], is here for second stage reconstruction following left mastectomy for recurrent breast cancer on left with immediate latissimus dorsi flap reconstruction. She has a history of lumpectomy with radiation prior to this. Also of note, patient has a history of prior cosmetic abdominal liposuction.   FINDINGS: Mentor Smooth Ultra High Profile 876 ml silicone implant placed in left chest. Red [id number]. SN [id number]. Total 110 ml fat infiltrated over left breast.  DESCRIPTION OF PROCEDURE:  The patient was marked with the patient in the preoperative area in standing position to mark areas over supra and infraumbilical abdomen for fat harvest, chest midline, sternal notch, breast meridians, inframammary folds. The location for nipple areolar complex marked on anterior surface right breast by palpation. The patient was taken to the operating room. SCDs were placed and IV antibiotics were given. The patient's operative site was prepped and draped in a sterile fashion. A time out was performed and all information was confirmed to be correct. Tumescent solution infiltrated over bilateral abdomen through stab incisions in preexisting scars in areas. Total of 450 ml fluid infiltrated. Patient brought to sitting position and right breast mastopexy marked  by tailor tacking. Nipple areolar complex marked with 45 mm diameter. Area marked for resection deepithelialized and incision made through dermis and superficial breast tissue to allow for movement of NAC superiorly and develop medial and lateral pillars. Final scar J type. Closure completed with 3-0 vicryl in superficial fascia to approximate pillars along vertical limb. NAC inset and vertical limb closed with 4-0 vicryl in dermis.  Power assisted liposuction completed over supra and infraumbilical abdomen to endpoint of symmetric contour and soft tissue thickness. Fat prepared for infiltration with washing and gravity. Stab incisions approximated with interrupted 4-0 monocryl.  I then directed attention to left breast. Incision made through superior latissimus skin paddle inset scar. Encountered latissimus muscle and this was divided in direction of muscle fibers to enter capsule.  Tissue expander removed. Radial Capsulotomies performed superiorly and over lateral chest wall. iPatient again brought to upright sitting position and various sizers tried. Final implant 535 ml smooth round silicone ultra high profile selected.   Using blunt canula, prepared fat placed subcutaneously and intramuscular over superior poles and within mastectomy flaps. Breast cavity irrigated with solution containing ancef, bacitracin, and gentamicin. Hemostasis obtained. 15 Fr drain placed in cavity. Implant placed and layered closure completed with 3-0 vicryl between to approximate muscle and capsule. 4-0 vicryl placed in dermis followed by running 4-0 monocryl subcuticular for skin closure over bilateral breasts. Covidien tissue glue, dry dressing, and breast and abdominal binders placed.   The patient was allowed to wake from anesthesia, extubated and taken to the recovery room in satisfactory condition.   SPECIMENS: Right breast mastopexy  DRAINS: 15 Fr JP in left reconstructed breast  Irene Limbo, MD [facility] [phone]

## 2014-09-03 NOTE — Anesthesia Postprocedure Evaluation (Signed)
  Anesthesia Post-op Note  Patient: Kelsey Mclaughlin  Procedure(s) Performed: Procedure(s): LEFT REMOVAL TISSUE EXPANDERS WITH PLACEMENT OF SILICONE IMPLANT  (Left)  LIPOFILLING TO LEFT CHEST   (Left) RIGHT BREAST MASTOPEXY  (Right)  Patient Location: PACU  Anesthesia Type: General   Level of Consciousness: awake, alert  and oriented  Airway and Oxygen Therapy: Patient Spontanous Breathing  Post-op Pain: mild  Post-op Assessment: Post-op Vital signs reviewed  Post-op Vital Signs: Reviewed  Last Vitals:  Filed Vitals:   09/03/14 1615  BP: 135/78  Pulse: 54  Temp:   Resp: 12    Complications: No apparent anesthesia complications

## 2014-09-03 NOTE — Transfer of Care (Signed)
Immediate Anesthesia Transfer of Care Note  Patient: Kelsey Mclaughlin  Procedure(s) Performed: Procedure(s): LEFT REMOVAL TISSUE EXPANDERS WITH PLACEMENT OF SILICONE IMPLANT  (Left)  LIPOFILLING TO LEFT CHEST   (Left) RIGHT BREAST MASTOPEXY  (Right)  Patient Location: PACU  Anesthesia Type:General  Level of Consciousness: awake, alert  and oriented  Airway & Oxygen Therapy: Patient Spontanous Breathing and Patient connected to face mask oxygen  Post-op Assessment: Report given to PACU RN and Post -op Vital signs reviewed and stable  Post vital signs: Reviewed and stable  Complications: No apparent anesthesia complications

## 2014-09-03 NOTE — Interval H&P Note (Signed)
History and Physical Interval Note:  31/43/8887 5:79 AM  Kelsey Mclaughlin  has presented today for surgery, with the diagnosis of HX OF LEFT BREAST CANCER ACQUIRED ABSENT BREAST ASMMETRY NATIVE AND RECONSTRUCTIVE BREAST   The various methods of treatment have been discussed with the patient and family. After consideration of risks, benefits and other options for treatment, the patient has consented to  Procedure(s): LEFT REMOVAL TISSUE EXPANDERS WITH PLACEMENT OF SILICONE IMPLANT  (Left) AND LIPOFILLING TO LEFT CHEST   (Left) RIGHT BREAST MASTOPEXY  (Right) as a surgical intervention .  The patient's history has been reviewed, patient examined, no change in status, stable for surgery.  I have reviewed the patient's chart and labs.  Questions were answered to the patient's satisfaction.     Kelsey Mclaughlin

## 2014-09-03 NOTE — Discharge Instructions (Signed)
Post Anesthesia Home Care Instructions  Activity: Get plenty of rest for the remainder of the day. A responsible adult should stay with you for 24 hours following the procedure.  For the next 24 hours, DO NOT: -Drive a car -Paediatric nurse -Drink alcoholic beverages -Take any medication unless instructed by your physician -Make any legal decisions or sign important papers.  Meals: Start with liquid foods such as gelatin or soup. Progress to regular foods as tolerated. Avoid greasy, spicy, heavy foods. If nausea and/or vomiting occur, drink only clear liquids until the nausea and/or vomiting subsides. Call your physician if vomiting continues.  Special Instructions/Symptoms: Your throat may feel dry or sore from the anesthesia or the breathing tube placed in your throat during surgery. If this causes discomfort, gargle with warm salt water. The discomfort should disappear within 24 hours.   JP Drain [employer] this sheet to all of your post-operative appointments while you have your drains.  Please measure your drains by CC's or ML's.  Make sure you drain and measure your JP Drains 3 times per day.  At the end of each day, add up totals for the left side and add up totals for the right side.    ( 9 am )     ( 3 pm )        ( 9 pm )                Date L  R  L  R  L  R  Total L/R                                                                                                                                                                                         About my Jackson-Pratt Bulb Drain  What is a Jackson-Pratt bulb? A Jackson-Pratt is a soft, round device used to collect drainage. It is connected to a long, thin drainage catheter, which is held in place by one or two small stiches near your surgical incision site. When the bulb is squeezed, it forms a vacuum, forcing the drainage to empty into the bulb.  Emptying the Jackson-Pratt bulb- To empty the  bulb: 1. Release the plug on the top of the bulb. 2. Pour the bulb's contents into a measuring container which your nurse will provide. 3. Record the time emptied and amount of drainage. Empty the drain(s) as often as your     doctor or nurse recommends.  Squeezing the Jackson-Pratt Bulb- To squeeze the bulb: 1. Make sure the plug at the top of the bulb is open. 2. Squeeze the bulb tightly in your  fist. You will hear air squeezing from the bulb. 3. Replace the plug while the bulb is squeezed. 4. Use a safety pin to attach the bulb to your clothing. This will keep the catheter from pulling at the bulb insertion site.  When to call your doctor- Call your doctor if:  Drain site becomes red, swollen or hot.  You have a fever greater than 101 degrees F.  There is oozing at the drain site.  Drain falls out (apply a guaze bandage over the drain hole and secure it with tape).  Drainage increases daily not related to activity patterns. (You will usually have more drainage when you are active than when you are resting.)  Drainage has a bad odor.

## 2014-09-04 ENCOUNTER — Encounter (HOSPITAL_BASED_OUTPATIENT_CLINIC_OR_DEPARTMENT_OTHER): Payer: Federal, State, Local not specified - PPO | Admitting: Oncology

## 2014-09-04 ENCOUNTER — Encounter (HOSPITAL_BASED_OUTPATIENT_CLINIC_OR_DEPARTMENT_OTHER): Payer: Self-pay | Admitting: Plastic Surgery

## 2014-09-04 ENCOUNTER — Ambulatory Visit: Payer: Federal, State, Local not specified - PPO

## 2014-09-04 ENCOUNTER — Ambulatory Visit (HOSPITAL_BASED_OUTPATIENT_CLINIC_OR_DEPARTMENT_OTHER): Payer: Federal, State, Local not specified - PPO

## 2014-09-04 DIAGNOSIS — Z95828 Presence of other vascular implants and grafts: Secondary | ICD-10-CM

## 2014-09-04 DIAGNOSIS — Z5112 Encounter for antineoplastic immunotherapy: Secondary | ICD-10-CM

## 2014-09-04 DIAGNOSIS — C50412 Malignant neoplasm of upper-outer quadrant of left female breast: Secondary | ICD-10-CM

## 2014-09-04 LAB — CBC WITH DIFFERENTIAL/PLATELET
BASO%: 0.1 % (ref 0.0–2.0)
BASOS ABS: 0 10*3/uL (ref 0.0–0.1)
EOS ABS: 0 10*3/uL (ref 0.0–0.5)
EOS%: 0.1 % (ref 0.0–7.0)
HCT: 31.5 % — ABNORMAL LOW (ref 34.8–46.6)
HGB: 10.5 g/dL — ABNORMAL LOW (ref 11.6–15.9)
LYMPH%: 13.8 % — ABNORMAL LOW (ref 14.0–49.7)
MCH: 30.4 pg (ref 25.1–34.0)
MCHC: 33.3 g/dL (ref 31.5–36.0)
MCV: 91.3 fL (ref 79.5–101.0)
MONO#: 1 10*3/uL — ABNORMAL HIGH (ref 0.1–0.9)
MONO%: 9.7 % (ref 0.0–14.0)
NEUT#: 8.2 10*3/uL — ABNORMAL HIGH (ref 1.5–6.5)
NEUT%: 76.3 % (ref 38.4–76.8)
Platelets: 272 10*3/uL (ref 145–400)
RBC: 3.45 10*6/uL — ABNORMAL LOW (ref 3.70–5.45)
RDW: 13.3 % (ref 11.2–14.5)
WBC: 10.7 10*3/uL — AB (ref 3.9–10.3)
lymph#: 1.5 10*3/uL (ref 0.9–3.3)

## 2014-09-04 LAB — COMPREHENSIVE METABOLIC PANEL (CC13)
ALBUMIN: 3.6 g/dL (ref 3.5–5.0)
ALT: 53 U/L (ref 0–55)
AST: 24 U/L (ref 5–34)
Alkaline Phosphatase: 79 U/L (ref 40–150)
Anion Gap: 9 mEq/L (ref 3–11)
BUN: 13.7 mg/dL (ref 7.0–26.0)
CO2: 27 mEq/L (ref 22–29)
CREATININE: 0.8 mg/dL (ref 0.6–1.1)
Calcium: 9.3 mg/dL (ref 8.4–10.4)
Chloride: 106 mEq/L (ref 98–109)
EGFR: 88 mL/min/{1.73_m2} — AB (ref >90)
GLUCOSE: 116 mg/dL (ref 70–140)
POTASSIUM: 4.1 meq/L (ref 3.5–5.1)
Sodium: 142 mEq/L (ref 136–145)
Total Bilirubin: <0.2 mg/dL (ref 0.20–1.20)
Total Protein: 6.4 g/dL (ref 6.4–8.3)

## 2014-09-04 MED ORDER — ACETAMINOPHEN 325 MG PO TABS
ORAL_TABLET | ORAL | Status: AC
  Filled 2014-09-04: qty 2

## 2014-09-04 MED ORDER — DIPHENHYDRAMINE HCL 25 MG PO CAPS
ORAL_CAPSULE | ORAL | Status: AC
  Filled 2014-09-04: qty 1

## 2014-09-04 MED ORDER — SODIUM CHLORIDE 0.9 % IJ SOLN
10.0000 mL | INTRAMUSCULAR | Status: DC | PRN
  Administered 2014-09-04: 10 mL
  Filled 2014-09-04: qty 10

## 2014-09-04 MED ORDER — HEPARIN SOD (PORK) LOCK FLUSH 100 UNIT/ML IV SOLN
500.0000 [IU] | Freq: Once | INTRAVENOUS | Status: AC | PRN
  Administered 2014-09-04: 500 [IU]
  Filled 2014-09-04: qty 5

## 2014-09-04 MED ORDER — DIPHENHYDRAMINE HCL 25 MG PO CAPS
25.0000 mg | ORAL_CAPSULE | Freq: Once | ORAL | Status: AC
  Administered 2014-09-04: 25 mg via ORAL

## 2014-09-04 MED ORDER — SODIUM CHLORIDE 0.9 % IJ SOLN
10.0000 mL | INTRAMUSCULAR | Status: DC | PRN
  Administered 2014-09-04: 10 mL via INTRAVENOUS
  Filled 2014-09-04: qty 10

## 2014-09-04 MED ORDER — SODIUM CHLORIDE 0.9 % IV SOLN
6.0000 mg/kg | Freq: Once | INTRAVENOUS | Status: AC
  Administered 2014-09-04: 504 mg via INTRAVENOUS
  Filled 2014-09-04: qty 24

## 2014-09-04 MED ORDER — ACETAMINOPHEN 325 MG PO TABS
650.0000 mg | ORAL_TABLET | Freq: Once | ORAL | Status: AC
  Administered 2014-09-04: 650 mg via ORAL

## 2014-09-04 MED ORDER — SODIUM CHLORIDE 0.9 % IV SOLN
Freq: Once | INTRAVENOUS | Status: AC
  Administered 2014-09-04: 14:00:00 via INTRAVENOUS

## 2014-09-04 NOTE — Patient Instructions (Signed)

## 2014-09-04 NOTE — Progress Notes (Signed)
Patient s/p breast tissue expander removal and implant placement 09/03/14; 1 drain present and surgical bandage/dressing intact. Patient states pain is well-controlled. L arm swelling noted, surgeon started patient on bactrim BID per pt. Denies fever and feels well. WBC slightly elevated at 10.7. OK to treat with Herceptin per Dr. Jana Hakim.

## 2014-09-04 NOTE — Patient Instructions (Signed)
[facility] Discharge Instructions for Patients Receiving Chemotherapy  Today you received the following chemotherapy agent: Herceptin   To help prevent nausea and vomiting after your treatment, we encourage you to take your nausea medication as prescribed.    If you develop nausea and vomiting that is not controlled by your nausea medication, call the clinic.   BELOW ARE SYMPTOMS THAT SHOULD BE REPORTED IMMEDIATELY:  *FEVER GREATER THAN 100.5 F  *CHILLS WITH OR WITHOUT FEVER  NAUSEA AND VOMITING THAT IS NOT CONTROLLED WITH YOUR NAUSEA MEDICATION  *UNUSUAL SHORTNESS OF BREATH  *UNUSUAL BRUISING OR BLEEDING  TENDERNESS IN MOUTH AND THROAT WITH OR WITHOUT PRESENCE OF ULCERS  *URINARY PROBLEMS  *BOWEL PROBLEMS  UNUSUAL RASH Items with * indicate a potential emergency and should be followed up as soon as possible.  Feel free to call the clinic you have any questions or concerns. The clinic phone number is [phone].    

## 2014-09-24 ENCOUNTER — Other Ambulatory Visit: Payer: Self-pay | Admitting: Emergency Medicine

## 2014-09-24 DIAGNOSIS — C50412 Malignant neoplasm of upper-outer quadrant of left female breast: Secondary | ICD-10-CM

## 2014-09-25 ENCOUNTER — Ambulatory Visit (HOSPITAL_BASED_OUTPATIENT_CLINIC_OR_DEPARTMENT_OTHER): Payer: Federal, State, Local not specified - PPO

## 2014-09-25 ENCOUNTER — Other Ambulatory Visit (HOSPITAL_BASED_OUTPATIENT_CLINIC_OR_DEPARTMENT_OTHER): Payer: Federal, State, Local not specified - PPO

## 2014-09-25 ENCOUNTER — Ambulatory Visit: Payer: Federal, State, Local not specified - PPO

## 2014-09-25 DIAGNOSIS — Z95828 Presence of other vascular implants and grafts: Secondary | ICD-10-CM

## 2014-09-25 DIAGNOSIS — Z5112 Encounter for antineoplastic immunotherapy: Secondary | ICD-10-CM | POA: Diagnosis not present

## 2014-09-25 DIAGNOSIS — C50412 Malignant neoplasm of upper-outer quadrant of left female breast: Secondary | ICD-10-CM | POA: Diagnosis not present

## 2014-09-25 LAB — COMPREHENSIVE METABOLIC PANEL (CC13)
ALT: 67 U/L — ABNORMAL HIGH (ref 0–55)
AST: 40 U/L — ABNORMAL HIGH (ref 5–34)
Albumin: 4 g/dL (ref 3.5–5.0)
Alkaline Phosphatase: 103 U/L (ref 40–150)
Anion Gap: 12 mEq/L — ABNORMAL HIGH (ref 3–11)
BUN: 11.8 mg/dL (ref 7.0–26.0)
CALCIUM: 9.4 mg/dL (ref 8.4–10.4)
CHLORIDE: 105 meq/L (ref 98–109)
CO2: 26 mEq/L (ref 22–29)
Creatinine: 0.8 mg/dL (ref 0.6–1.1)
EGFR: 88 mL/min/{1.73_m2} — ABNORMAL LOW (ref >90)
Glucose: 108 mg/dl (ref 70–140)
Potassium: 3.9 mEq/L (ref 3.5–5.1)
Sodium: 143 mEq/L (ref 136–145)
Total Bilirubin: <0.2 mg/dL (ref 0.20–1.20)
Total Protein: 6.9 g/dL (ref 6.4–8.3)

## 2014-09-25 LAB — CBC WITH DIFFERENTIAL/PLATELET
BASO%: 0.3 % (ref 0.0–2.0)
Basophils Absolute: 0 10*3/uL (ref 0.0–0.1)
EOS ABS: 0.3 10*3/uL (ref 0.0–0.5)
EOS%: 4.2 % (ref 0.0–7.0)
HCT: 37.3 % (ref 34.8–46.6)
HGB: 12.3 g/dL (ref 11.6–15.9)
LYMPH%: 17.5 % (ref 14.0–49.7)
MCH: 30.3 pg (ref 25.1–34.0)
MCHC: 33 g/dL (ref 31.5–36.0)
MCV: 91.9 fL (ref 79.5–101.0)
MONO#: 0.6 10*3/uL (ref 0.1–0.9)
MONO%: 7.8 % (ref 0.0–14.0)
NEUT#: 5.6 10*3/uL (ref 1.5–6.5)
NEUT%: 70.2 % (ref 38.4–76.8)
PLATELETS: 275 10*3/uL (ref 145–400)
RBC: 4.06 10*6/uL (ref 3.70–5.45)
RDW: 12.9 % (ref 11.2–14.5)
WBC: 7.9 10*3/uL (ref 3.9–10.3)
lymph#: 1.4 10*3/uL (ref 0.9–3.3)

## 2014-09-25 MED ORDER — HEPARIN SOD (PORK) LOCK FLUSH 100 UNIT/ML IV SOLN
500.0000 [IU] | Freq: Once | INTRAVENOUS | Status: AC | PRN
  Administered 2014-09-25: 500 [IU]
  Filled 2014-09-25: qty 5

## 2014-09-25 MED ORDER — SODIUM CHLORIDE 0.9 % IV SOLN
Freq: Once | INTRAVENOUS | Status: AC
  Administered 2014-09-25: 14:00:00 via INTRAVENOUS

## 2014-09-25 MED ORDER — DIPHENHYDRAMINE HCL 25 MG PO CAPS
ORAL_CAPSULE | ORAL | Status: AC
  Filled 2014-09-25: qty 1

## 2014-09-25 MED ORDER — ACETAMINOPHEN 325 MG PO TABS
650.0000 mg | ORAL_TABLET | Freq: Once | ORAL | Status: AC
  Administered 2014-09-25: 650 mg via ORAL

## 2014-09-25 MED ORDER — SODIUM CHLORIDE 0.9 % IJ SOLN
10.0000 mL | INTRAMUSCULAR | Status: DC | PRN
Start: 2014-09-25 — End: 2014-09-25
  Administered 2014-09-25: 10 mL
  Filled 2014-09-25: qty 10

## 2014-09-25 MED ORDER — SODIUM CHLORIDE 0.9 % IJ SOLN
10.0000 mL | INTRAMUSCULAR | Status: DC | PRN
  Administered 2014-09-25: 10 mL via INTRAVENOUS
  Filled 2014-09-25: qty 10

## 2014-09-25 MED ORDER — DIPHENHYDRAMINE HCL 25 MG PO CAPS
25.0000 mg | ORAL_CAPSULE | Freq: Once | ORAL | Status: AC
  Administered 2014-09-25: 25 mg via ORAL

## 2014-09-25 MED ORDER — ACETAMINOPHEN 325 MG PO TABS
ORAL_TABLET | ORAL | Status: AC
  Filled 2014-09-25: qty 2

## 2014-09-25 MED ORDER — TRASTUZUMAB CHEMO INJECTION 440 MG
6.0000 mg/kg | Freq: Once | INTRAVENOUS | Status: AC
  Administered 2014-09-25: 504 mg via INTRAVENOUS
  Filled 2014-09-25: qty 24

## 2014-09-25 NOTE — Patient Instructions (Signed)
[facility] Discharge Instructions for Patients Receiving Chemotherapy  Today you received the following chemotherapy agents Herceptin  To help prevent nausea and vomiting after your treatment, we encourage you to take your nausea medication     If you develop nausea and vomiting that is not controlled by your nausea medication, call the clinic.   BELOW ARE SYMPTOMS THAT SHOULD BE REPORTED IMMEDIATELY:  *FEVER GREATER THAN 100.5 F  *CHILLS WITH OR WITHOUT FEVER  NAUSEA AND VOMITING THAT IS NOT CONTROLLED WITH YOUR NAUSEA MEDICATION  *UNUSUAL SHORTNESS OF BREATH  *UNUSUAL BRUISING OR BLEEDING  TENDERNESS IN MOUTH AND THROAT WITH OR WITHOUT PRESENCE OF ULCERS  *URINARY PROBLEMS  *BOWEL PROBLEMS  UNUSUAL RASH Items with * indicate a potential emergency and should be followed up as soon as possible.  Feel free to call the clinic you have any questions or concerns. The clinic phone number is [phone].    

## 2014-09-25 NOTE — Patient Instructions (Signed)

## 2014-10-01 ENCOUNTER — Encounter: Payer: Self-pay | Admitting: Oncology

## 2014-10-01 NOTE — Progress Notes (Signed)
Got renewal for Herceptin thru [employer]. Pt's new effective dates are 09/27/14 - 09/26/15.  The amount of the grant is $25,000.  I forwarded a copy of letter to [facility] in billing.

## 2014-10-07 ENCOUNTER — Telehealth: Payer: Self-pay | Admitting: Oncology

## 2014-10-07 NOTE — Telephone Encounter (Signed)
Due to [facility] moved 3/16 appt to PM. s/w pt spouse [name] re change with new time. pt will get new schedule at next appt 2/3.

## 2014-10-15 ENCOUNTER — Other Ambulatory Visit: Payer: Self-pay | Admitting: *Deleted

## 2014-10-15 DIAGNOSIS — C50412 Malignant neoplasm of upper-outer quadrant of left female breast: Secondary | ICD-10-CM

## 2014-10-15 NOTE — Progress Notes (Signed)
Request to schedule ECHO sent to schedule per order for 08/2014 which was not scheduled.  Marked as urgent.

## 2014-10-16 ENCOUNTER — Other Ambulatory Visit (HOSPITAL_BASED_OUTPATIENT_CLINIC_OR_DEPARTMENT_OTHER): Payer: Federal, State, Local not specified - PPO

## 2014-10-16 ENCOUNTER — Telehealth: Payer: Self-pay | Admitting: Oncology

## 2014-10-16 ENCOUNTER — Ambulatory Visit (HOSPITAL_BASED_OUTPATIENT_CLINIC_OR_DEPARTMENT_OTHER): Payer: Federal, State, Local not specified - PPO

## 2014-10-16 DIAGNOSIS — Z5112 Encounter for antineoplastic immunotherapy: Secondary | ICD-10-CM

## 2014-10-16 DIAGNOSIS — C50412 Malignant neoplasm of upper-outer quadrant of left female breast: Secondary | ICD-10-CM

## 2014-10-16 LAB — COMPREHENSIVE METABOLIC PANEL (CC13)
ALBUMIN: 4.1 g/dL (ref 3.5–5.0)
ALT: 74 U/L — ABNORMAL HIGH (ref 0–55)
AST: 39 U/L — ABNORMAL HIGH (ref 5–34)
Alkaline Phosphatase: 107 U/L (ref 40–150)
Anion Gap: 12 mEq/L — ABNORMAL HIGH (ref 3–11)
BUN: 12.9 mg/dL (ref 7.0–26.0)
CO2: 23 mEq/L (ref 22–29)
CREATININE: 0.8 mg/dL (ref 0.6–1.1)
Calcium: 9.2 mg/dL (ref 8.4–10.4)
Chloride: 106 mEq/L (ref 98–109)
EGFR: 85 mL/min/{1.73_m2} — ABNORMAL LOW (ref >90)
GLUCOSE: 106 mg/dL (ref 70–140)
Potassium: 3.8 mEq/L (ref 3.5–5.1)
Sodium: 141 mEq/L (ref 136–145)
Total Bilirubin: 0.2 mg/dL (ref 0.20–1.20)
Total Protein: 7.1 g/dL (ref 6.4–8.3)

## 2014-10-16 LAB — CBC WITH DIFFERENTIAL/PLATELET
BASO%: 0.9 % (ref 0.0–2.0)
Basophils Absolute: 0.1 10*3/uL (ref 0.0–0.1)
EOS%: 4.5 % (ref 0.0–7.0)
Eosinophils Absolute: 0.3 10*3/uL (ref 0.0–0.5)
HEMATOCRIT: 41 % (ref 34.8–46.6)
HGB: 13.2 g/dL (ref 11.6–15.9)
LYMPH%: 19.3 % (ref 14.0–49.7)
MCH: 29.6 pg (ref 25.1–34.0)
MCHC: 32.1 g/dL (ref 31.5–36.0)
MCV: 92 fL (ref 79.5–101.0)
MONO#: 0.5 10*3/uL (ref 0.1–0.9)
MONO%: 7.4 % (ref 0.0–14.0)
NEUT%: 67.9 % (ref 38.4–76.8)
NEUTROS ABS: 4.2 10*3/uL (ref 1.5–6.5)
PLATELETS: 287 10*3/uL (ref 145–400)
RBC: 4.46 10*6/uL (ref 3.70–5.45)
RDW: 13.2 % (ref 11.2–14.5)
WBC: 6.2 10*3/uL (ref 3.9–10.3)
lymph#: 1.2 10*3/uL (ref 0.9–3.3)

## 2014-10-16 MED ORDER — SODIUM CHLORIDE 0.9 % IJ SOLN
10.0000 mL | INTRAMUSCULAR | Status: DC | PRN
  Administered 2014-10-16: 10 mL
  Filled 2014-10-16: qty 10

## 2014-10-16 MED ORDER — TRASTUZUMAB CHEMO INJECTION 440 MG
6.0000 mg/kg | Freq: Once | INTRAVENOUS | Status: AC
  Administered 2014-10-16: 504 mg via INTRAVENOUS
  Filled 2014-10-16: qty 24

## 2014-10-16 MED ORDER — DIPHENHYDRAMINE HCL 25 MG PO CAPS
25.0000 mg | ORAL_CAPSULE | Freq: Once | ORAL | Status: AC
  Administered 2014-10-16: 25 mg via ORAL

## 2014-10-16 MED ORDER — DIPHENHYDRAMINE HCL 25 MG PO CAPS
ORAL_CAPSULE | ORAL | Status: AC
  Filled 2014-10-16: qty 1

## 2014-10-16 MED ORDER — SODIUM CHLORIDE 0.9 % IV SOLN
Freq: Once | INTRAVENOUS | Status: AC
  Administered 2014-10-16: 14:00:00 via INTRAVENOUS

## 2014-10-16 MED ORDER — ACETAMINOPHEN 325 MG PO TABS
ORAL_TABLET | ORAL | Status: AC
  Filled 2014-10-16: qty 2

## 2014-10-16 MED ORDER — HEPARIN SOD (PORK) LOCK FLUSH 100 UNIT/ML IV SOLN
500.0000 [IU] | Freq: Once | INTRAVENOUS | Status: AC | PRN
  Administered 2014-10-16: 500 [IU]
  Filled 2014-10-16: qty 5

## 2014-10-16 MED ORDER — ACETAMINOPHEN 325 MG PO TABS
650.0000 mg | ORAL_TABLET | Freq: Once | ORAL | Status: AC
  Administered 2014-10-16: 650 mg via ORAL

## 2014-10-16 NOTE — Telephone Encounter (Signed)
perpof sent pre-cwert ot Vaughan Basta for pre-cert fopr ECHO-will call & sch after reply

## 2014-10-16 NOTE — Telephone Encounter (Signed)
per pof to sch pt appt-per Vaughan Basta [employer]-cld & spoke w/pt spouse [name] and gave time/date/location-adv to get updated sch when she comes in today-she understood

## 2014-10-16 NOTE — Patient Instructions (Signed)
[facility] Discharge Instructions for Patients Receiving Chemotherapy  Today you received the following chemotherapy agents Herceptin.  To help prevent nausea and vomiting after your treatment, we encourage you to take your nausea medication as directed.   If you develop nausea and vomiting that is not controlled by your nausea medication, call the clinic.   BELOW ARE SYMPTOMS THAT SHOULD BE REPORTED IMMEDIATELY:  *FEVER GREATER THAN 100.5 F  *CHILLS WITH OR WITHOUT FEVER  NAUSEA AND VOMITING THAT IS NOT CONTROLLED WITH YOUR NAUSEA MEDICATION  *UNUSUAL SHORTNESS OF BREATH  *UNUSUAL BRUISING OR BLEEDING  TENDERNESS IN MOUTH AND THROAT WITH OR WITHOUT PRESENCE OF ULCERS  *URINARY PROBLEMS  *BOWEL PROBLEMS  UNUSUAL RASH Items with * indicate a potential emergency and should be followed up as soon as possible.  Feel free to call the clinic you have any questions or concerns. The clinic phone number is [phone].  

## 2014-10-21 ENCOUNTER — Ambulatory Visit (HOSPITAL_COMMUNITY)
Admission: RE | Admit: 2014-10-21 | Discharge: 2014-10-21 | Disposition: A | Discharge status: Home / Self Care | Payer: Federal, State, Local not specified - PPO | Source: Ambulatory Visit | Attending: Oncology | Admitting: Oncology

## 2014-10-21 ENCOUNTER — Ambulatory Visit (HOSPITAL_COMMUNITY): Payer: Federal, State, Local not specified - PPO

## 2014-10-21 DIAGNOSIS — C50919 Malignant neoplasm of unspecified site of unspecified female breast: Secondary | ICD-10-CM | POA: Insufficient documentation

## 2014-10-21 DIAGNOSIS — C50912 Malignant neoplasm of unspecified site of left female breast: Secondary | ICD-10-CM

## 2014-10-21 DIAGNOSIS — Z08 Encounter for follow-up examination after completed treatment for malignant neoplasm: Secondary | ICD-10-CM

## 2014-10-21 NOTE — Progress Notes (Signed)
Echocardiogram 2D Echocardiogram has been performed.  Kelsey Mclaughlin 10/21/2014, 2:57 PM

## 2014-11-05 ENCOUNTER — Other Ambulatory Visit: Payer: Self-pay

## 2014-11-05 DIAGNOSIS — C50412 Malignant neoplasm of upper-outer quadrant of left female breast: Secondary | ICD-10-CM

## 2014-11-06 ENCOUNTER — Other Ambulatory Visit (HOSPITAL_BASED_OUTPATIENT_CLINIC_OR_DEPARTMENT_OTHER): Payer: Federal, State, Local not specified - PPO

## 2014-11-06 ENCOUNTER — Ambulatory Visit: Payer: Federal, State, Local not specified - PPO

## 2014-11-06 ENCOUNTER — Ambulatory Visit (HOSPITAL_BASED_OUTPATIENT_CLINIC_OR_DEPARTMENT_OTHER): Payer: Federal, State, Local not specified - PPO

## 2014-11-06 DIAGNOSIS — Z5112 Encounter for antineoplastic immunotherapy: Secondary | ICD-10-CM

## 2014-11-06 DIAGNOSIS — C50412 Malignant neoplasm of upper-outer quadrant of left female breast: Secondary | ICD-10-CM

## 2014-11-06 DIAGNOSIS — Z95828 Presence of other vascular implants and grafts: Secondary | ICD-10-CM

## 2014-11-06 LAB — COMPREHENSIVE METABOLIC PANEL (CC13)
ALK PHOS: 100 U/L (ref 40–150)
ALT: 81 U/L — AB (ref 0–55)
ANION GAP: 10 meq/L (ref 3–11)
AST: 43 U/L — ABNORMAL HIGH (ref 5–34)
Albumin: 3.9 g/dL (ref 3.5–5.0)
BUN: 10.5 mg/dL (ref 7.0–26.0)
CO2: 24 meq/L (ref 22–29)
CREATININE: 0.7 mg/dL (ref 0.6–1.1)
Calcium: 9.2 mg/dL (ref 8.4–10.4)
Chloride: 107 mEq/L (ref 98–109)
EGFR: >90 mL/min/{1.73_m2} (ref >90)
Glucose: 96 mg/dl (ref 70–140)
Potassium: 3.9 mEq/L (ref 3.5–5.1)
SODIUM: 142 meq/L (ref 136–145)
Total Protein: 6.7 g/dL (ref 6.4–8.3)

## 2014-11-06 LAB — CBC WITH DIFFERENTIAL/PLATELET
BASO%: 0.6 % (ref 0.0–2.0)
BASOS ABS: 0 10*3/uL (ref 0.0–0.1)
EOS ABS: 0.2 10*3/uL (ref 0.0–0.5)
EOS%: 4.7 % (ref 0.0–7.0)
HEMATOCRIT: 37.3 % (ref 34.8–46.6)
HEMOGLOBIN: 12.4 g/dL (ref 11.6–15.9)
LYMPH%: 29.4 % (ref 14.0–49.7)
MCH: 30.2 pg (ref 25.1–34.0)
MCHC: 33.2 g/dL (ref 31.5–36.0)
MCV: 91 fL (ref 79.5–101.0)
MONO#: 0.5 10*3/uL (ref 0.1–0.9)
MONO%: 9.7 % (ref 0.0–14.0)
NEUT%: 55.6 % (ref 38.4–76.8)
NEUTROS ABS: 2.9 10*3/uL (ref 1.5–6.5)
Platelets: 286 10*3/uL (ref 145–400)
RBC: 4.1 10*6/uL (ref 3.70–5.45)
RDW: 13 % (ref 11.2–14.5)
WBC: 5.1 10*3/uL (ref 3.9–10.3)
lymph#: 1.5 10*3/uL (ref 0.9–3.3)

## 2014-11-06 MED ORDER — ACETAMINOPHEN 325 MG PO TABS
650.0000 mg | ORAL_TABLET | Freq: Once | ORAL | Status: AC
  Administered 2014-11-06: 650 mg via ORAL

## 2014-11-06 MED ORDER — HEPARIN SOD (PORK) LOCK FLUSH 100 UNIT/ML IV SOLN
500.0000 [IU] | Freq: Once | INTRAVENOUS | Status: AC
  Administered 2014-11-06: 500 [IU] via INTRAVENOUS
  Filled 2014-11-06: qty 5

## 2014-11-06 MED ORDER — SODIUM CHLORIDE 0.9 % IJ SOLN
10.0000 mL | INTRAMUSCULAR | Status: DC | PRN
  Administered 2014-11-06: 10 mL via INTRAVENOUS
  Filled 2014-11-06: qty 10

## 2014-11-06 MED ORDER — DIPHENHYDRAMINE HCL 25 MG PO CAPS
25.0000 mg | ORAL_CAPSULE | Freq: Once | ORAL | Status: AC
  Administered 2014-11-06: 25 mg via ORAL

## 2014-11-06 MED ORDER — ACETAMINOPHEN 325 MG PO TABS
ORAL_TABLET | ORAL | Status: AC
  Filled 2014-11-06: qty 2

## 2014-11-06 MED ORDER — HEPARIN SOD (PORK) LOCK FLUSH 100 UNIT/ML IV SOLN
500.0000 [IU] | Freq: Once | INTRAVENOUS | Status: AC | PRN
  Administered 2014-11-06: 500 [IU]
  Filled 2014-11-06: qty 5

## 2014-11-06 MED ORDER — DIPHENHYDRAMINE HCL 25 MG PO CAPS
ORAL_CAPSULE | ORAL | Status: AC
  Filled 2014-11-06: qty 1

## 2014-11-06 MED ORDER — TRASTUZUMAB CHEMO INJECTION 440 MG
6.0000 mg/kg | Freq: Once | INTRAVENOUS | Status: AC
  Administered 2014-11-06: 504 mg via INTRAVENOUS
  Filled 2014-11-06: qty 24

## 2014-11-06 MED ORDER — SODIUM CHLORIDE 0.9 % IJ SOLN
10.0000 mL | INTRAMUSCULAR | Status: DC | PRN
  Administered 2014-11-06: 10 mL
  Filled 2014-11-06: qty 10

## 2014-11-06 MED ORDER — SODIUM CHLORIDE 0.9 % IV SOLN
Freq: Once | INTRAVENOUS | Status: AC
  Administered 2014-11-06: 14:00:00 via INTRAVENOUS

## 2014-11-06 NOTE — Patient Instructions (Signed)
[facility] Discharge Instructions for Patients Receiving Chemotherapy  Today you received the following chemotherapy agents Herceptin.  To help prevent nausea and vomiting after your treatment, we encourage you to take your nausea medication as directed.   If you develop nausea and vomiting that is not controlled by your nausea medication, call the clinic.   BELOW ARE SYMPTOMS THAT SHOULD BE REPORTED IMMEDIATELY:  *FEVER GREATER THAN 100.5 F  *CHILLS WITH OR WITHOUT FEVER  NAUSEA AND VOMITING THAT IS NOT CONTROLLED WITH YOUR NAUSEA MEDICATION  *UNUSUAL SHORTNESS OF BREATH  *UNUSUAL BRUISING OR BLEEDING  TENDERNESS IN MOUTH AND THROAT WITH OR WITHOUT PRESENCE OF ULCERS  *URINARY PROBLEMS  *BOWEL PROBLEMS  UNUSUAL RASH Items with * indicate a potential emergency and should be followed up as soon as possible.  Feel free to call the clinic you have any questions or concerns. The clinic phone number is [phone].  

## 2014-11-06 NOTE — Progress Notes (Signed)
Patient's significant other is acting as Optometrist for today.

## 2014-11-20 ENCOUNTER — Other Ambulatory Visit: Payer: Self-pay | Admitting: Oncology

## 2014-11-20 DIAGNOSIS — Z9012 Acquired absence of left breast and nipple: Secondary | ICD-10-CM

## 2014-11-20 DIAGNOSIS — Z853 Personal history of malignant neoplasm of breast: Secondary | ICD-10-CM

## 2014-11-26 ENCOUNTER — Other Ambulatory Visit: Payer: Self-pay | Admitting: *Deleted

## 2014-11-26 DIAGNOSIS — C50412 Malignant neoplasm of upper-outer quadrant of left female breast: Secondary | ICD-10-CM

## 2014-11-27 ENCOUNTER — Other Ambulatory Visit: Payer: Federal, State, Local not specified - PPO

## 2014-11-27 ENCOUNTER — Ambulatory Visit: Payer: Federal, State, Local not specified - PPO

## 2014-11-27 ENCOUNTER — Ambulatory Visit: Payer: Federal, State, Local not specified - PPO | Admitting: Oncology

## 2014-12-04 ENCOUNTER — Telehealth: Payer: Self-pay | Admitting: Nurse Practitioner

## 2014-12-04 ENCOUNTER — Telehealth: Payer: Self-pay | Admitting: *Deleted

## 2014-12-04 NOTE — Telephone Encounter (Signed)
Spoke with Kelsey Mclaughlin and she is aware of the appointments on 3/25

## 2014-12-04 NOTE — Telephone Encounter (Signed)
Patient's spouse, [name], called to say that they missed Robbin's last appointment for labs/MD/Herceptin by mistake and have tried to get these rescheduled.  She said she has left messages with the schedulers by no one has called her back.  I will enter a POF with this note to reschedule ASAP.

## 2014-12-06 ENCOUNTER — Other Ambulatory Visit (HOSPITAL_BASED_OUTPATIENT_CLINIC_OR_DEPARTMENT_OTHER): Payer: Federal, State, Local not specified - PPO

## 2014-12-06 ENCOUNTER — Encounter: Payer: Self-pay | Admitting: Nurse Practitioner

## 2014-12-06 ENCOUNTER — Ambulatory Visit (HOSPITAL_BASED_OUTPATIENT_CLINIC_OR_DEPARTMENT_OTHER): Payer: Federal, State, Local not specified - PPO

## 2014-12-06 ENCOUNTER — Ambulatory Visit (HOSPITAL_BASED_OUTPATIENT_CLINIC_OR_DEPARTMENT_OTHER): Payer: Federal, State, Local not specified - PPO | Admitting: Nurse Practitioner

## 2014-12-06 ENCOUNTER — Telehealth: Payer: Self-pay | Admitting: Oncology

## 2014-12-06 VITALS — BP 136/79 | HR 58 | Temp 98.1°F | Resp 18 | Ht 67.0 in | Wt 181.1 lb

## 2014-12-06 DIAGNOSIS — C50412 Malignant neoplasm of upper-outer quadrant of left female breast: Secondary | ICD-10-CM

## 2014-12-06 DIAGNOSIS — Z170 Estrogen receptor positive status [ER+]: Secondary | ICD-10-CM | POA: Diagnosis not present

## 2014-12-06 DIAGNOSIS — Z5112 Encounter for antineoplastic immunotherapy: Secondary | ICD-10-CM | POA: Diagnosis not present

## 2014-12-06 LAB — CBC WITH DIFFERENTIAL/PLATELET
BASO%: 0.6 % (ref 0.0–2.0)
BASOS ABS: 0 10*3/uL (ref 0.0–0.1)
EOS ABS: 0.3 10*3/uL (ref 0.0–0.5)
EOS%: 4.7 % (ref 0.0–7.0)
HCT: 38 % (ref 34.8–46.6)
HEMOGLOBIN: 12.7 g/dL (ref 11.6–15.9)
LYMPH%: 17.7 % (ref 14.0–49.7)
MCH: 30.8 pg (ref 25.1–34.0)
MCHC: 33.4 g/dL (ref 31.5–36.0)
MCV: 92 fL (ref 79.5–101.0)
MONO#: 0.6 10*3/uL (ref 0.1–0.9)
MONO%: 8.8 % (ref 0.0–14.0)
NEUT%: 68.2 % (ref 38.4–76.8)
NEUTROS ABS: 4.8 10*3/uL (ref 1.5–6.5)
PLATELETS: 364 10*3/uL (ref 145–400)
RBC: 4.13 10*6/uL (ref 3.70–5.45)
RDW: 13.1 % (ref 11.2–14.5)
WBC: 7.1 10*3/uL (ref 3.9–10.3)
lymph#: 1.3 10*3/uL (ref 0.9–3.3)

## 2014-12-06 LAB — COMPREHENSIVE METABOLIC PANEL (CC13)
ALT: 64 U/L — AB (ref 0–55)
ANION GAP: 11 meq/L (ref 3–11)
AST: 35 U/L — ABNORMAL HIGH (ref 5–34)
Albumin: 3.8 g/dL (ref 3.5–5.0)
Alkaline Phosphatase: 106 U/L (ref 40–150)
BILIRUBIN TOTAL: 0.37 mg/dL (ref 0.20–1.20)
BUN: 9.9 mg/dL (ref 7.0–26.0)
CO2: 26 meq/L (ref 22–29)
Calcium: 9.5 mg/dL (ref 8.4–10.4)
Chloride: 104 mEq/L (ref 98–109)
Creatinine: 0.7 mg/dL (ref 0.6–1.1)
EGFR: >90 mL/min/{1.73_m2} (ref >90)
Glucose: 103 mg/dl (ref 70–140)
Potassium: 4.1 mEq/L (ref 3.5–5.1)
SODIUM: 141 meq/L (ref 136–145)
TOTAL PROTEIN: 6.9 g/dL (ref 6.4–8.3)

## 2014-12-06 MED ORDER — ACETAMINOPHEN 325 MG PO TABS
ORAL_TABLET | ORAL | Status: AC
  Filled 2014-12-06: qty 2

## 2014-12-06 MED ORDER — ACETAMINOPHEN 325 MG PO TABS
650.0000 mg | ORAL_TABLET | Freq: Once | ORAL | Status: AC
  Administered 2014-12-06: 650 mg via ORAL

## 2014-12-06 MED ORDER — DIPHENHYDRAMINE HCL 25 MG PO CAPS
ORAL_CAPSULE | ORAL | Status: AC
  Filled 2014-12-06: qty 1

## 2014-12-06 MED ORDER — HEPARIN SOD (PORK) LOCK FLUSH 100 UNIT/ML IV SOLN
500.0000 [IU] | Freq: Once | INTRAVENOUS | Status: AC | PRN
  Administered 2014-12-06: 500 [IU]
  Filled 2014-12-06: qty 5

## 2014-12-06 MED ORDER — SODIUM CHLORIDE 0.9 % IV SOLN
Freq: Once | INTRAVENOUS | Status: AC
Start: 2014-12-06 — End: 2014-12-06
  Administered 2014-12-06: 10:00:00 via INTRAVENOUS

## 2014-12-06 MED ORDER — DIPHENHYDRAMINE HCL 25 MG PO CAPS
25.0000 mg | ORAL_CAPSULE | Freq: Once | ORAL | Status: AC
Start: 2014-12-06 — End: 2014-12-06
  Administered 2014-12-06: 25 mg via ORAL

## 2014-12-06 MED ORDER — TRASTUZUMAB CHEMO INJECTION 440 MG
6.0000 mg/kg | Freq: Once | INTRAVENOUS | Status: AC
  Administered 2014-12-06: 504 mg via INTRAVENOUS
  Filled 2014-12-06: qty 24

## 2014-12-06 MED ORDER — SODIUM CHLORIDE 0.9 % IJ SOLN
10.0000 mL | INTRAMUSCULAR | Status: DC | PRN
  Administered 2014-12-06: 10 mL
  Filled 2014-12-06: qty 10

## 2014-12-06 NOTE — Patient Instructions (Signed)
[facility] Discharge Instructions for Patients Receiving Chemotherapy  Today you received the following chemotherapy agents:  Herceptin  To help prevent nausea and vomiting after your treatment, we encourage you to take your nausea medication as prescribed.   If you develop nausea and vomiting that is not controlled by your nausea medication, call the clinic.   BELOW ARE SYMPTOMS THAT SHOULD BE REPORTED IMMEDIATELY:  *FEVER GREATER THAN 100.5 F  *CHILLS WITH OR WITHOUT FEVER  NAUSEA AND VOMITING THAT IS NOT CONTROLLED WITH YOUR NAUSEA MEDICATION  *UNUSUAL SHORTNESS OF BREATH  *UNUSUAL BRUISING OR BLEEDING  TENDERNESS IN MOUTH AND THROAT WITH OR WITHOUT PRESENCE OF ULCERS  *URINARY PROBLEMS  *BOWEL PROBLEMS  UNUSUAL RASH Items with * indicate a potential emergency and should be followed up as soon as possible.  Feel free to call the clinic you have any questions or concerns. The clinic phone number is [phone].  Please show the CHEMO ALERT CARD at check-in to the Emergency Department and triage nurse.   

## 2014-12-06 NOTE — Progress Notes (Signed)
[facility]  [phone] [phone]     ID: Kelsey Mclaughlin OB: 02-13-1961  MR#: [id number]  VOZ#:[id number]  PCP: [name], MD GYN:   SU: Donnie Mesa MD OTHER MD: Irene Limbo MD, Gwendolyn Grant MD  CHIEF COMPLAINT: Breast cancer, recurrent, estrogen receptor positive CURRENT TREATMENT: anti HER-2 immunotherapy; to start anti-estrogen therapy  BREAST CANCER HISTORY: From doctor [name]'s intake note [id number]:  "Kelsey Mclaughlin is a 53 y.o. female. In 1998 at the age of 41 was diagnosed with left breast cancer in [place]. This was an invasive carcinoma grade 2 stage II. At that time she underwent a lumpectomy with excellent lymph node dissection. She received 6 months of chemotherapy. [name] She also had radiation therapy to the left breast. 2011 patient noticed a lump in the outside of her left breast. She had ultrasound workup performed and was told it was negative no biopsies were performed. General 2014 after patient moved to the [place] she had a mammogram performed this revealed a possible mass in the outer quadrant of the left breast. Ultrasound showed it to be 1.7 cm. MRI of the breasts performed on January 30 revealed the mass to be 1.5 x 2.2 x 1.5 cm. Because of this she also had a biopsy performed on 10/04/2013. The biopsy revealed [[id number]) an invasive ductal carcinoma with ductal carcinoma in situ grade 2. Prognostic panel was positive for estrogen receptor 100% megestrol receptor +33% proliferation marker Ki-67 15% and the tumor was HER-2/neu positive" [with a signals ratio of 3.1 and number per cell 6.3]  The patient's subsequent history is as detailed below  INTERVAL HISTORY: Ja returns today for follow up of her breast cancer, accompanied by her spouse [name]. Today she is due for her last trastuzumab infusion. She had an  echocardiogram last month that showed a well preserved ejection fraction. Kelsey Mclaughlin has also been on anastrozole since November 2015. She is tolerating this well, with no hot flashes or vaginal dryness. She does have some aches and pains in her joints, but this is not significant enough to take tylenol or advil.   REVIEW OF SYSTEMS: Kelsey Mclaughlin denies fevers, chills, nausea, vomiting, or changes in bowel or bladder habits. She has no shortness of breath, chest pain, cough, or palpitations. She has no headaches dizziness, unexplained weight loss, night sweats or fatigue. A detailed review of systems is otherwise stable.   PAST MEDICAL HISTORY: Past Medical History  Diagnosis Date  . Cancer 1998, 2015    breast  . Bronchitis     completed antibiotics 10/21/13/ states resolved  . Stroke 2004    TIA-  no problems since  . Hypertension     no meds  . Lymph edema     lt arm    PAST SURGICAL HISTORY: Past Surgical History  Procedure Laterality Date  . Breast surgery  1998    lumpectomy on left and removed lymphnodes in [place]  . Portacath placement Right 10/25/2013    Procedure: INSERTION PORT-A-CATH;  Surgeon: Imogene Burn. Tsuei, MD;  Location: [facility];  Service: General;  Laterality: Right;  . Simple mastectomy with axillary sentinel node biopsy Left 04/12/2014    Procedure: MASTECTOMY;  Surgeon: Imogene Burn. Georgette Dover, MD;  Location: [facility];  Service: General;  Laterality: Left;  . Latissimus flap to breast Left 04/12/2014    Procedure: LEFT LATISSIMUS DORSI FLAP FOR LEFT BREAST RECONSTRUCTION AND  PLACEMENT OF TISSUE EXPANDER;  Surgeon: Irene Limbo, MD;  Location: [facility];  Service: Plastics;  Laterality: Left;  . Tissue expander placement Left 04/12/2014    Procedure: TISSUE EXPANDER;  Surgeon: Irene Limbo, MD;  Location: [facility];  Service: Plastics;  Laterality: Left;  . Tissue expander placement Left 05/22/2014    Procedure: PLACEMENT OF TISSUE EXPANDER/I&D WASHOUT SEROMA;  Surgeon: Irene Limbo, MD;   Location: [facility];  Service: Plastics;  Laterality: Left;  . Breast implant exchange Left 09/03/2014    Procedure: LEFT REMOVAL TISSUE EXPANDERS WITH PLACEMENT OF SILICONE IMPLANT ;  Surgeon: Irene Limbo, MD;  Location: [facility];  Service: Plastics;  Laterality: Left;  . Liposuction with lipofilling Left 09/03/2014    Procedure:  LIPOFILLING TO LEFT CHEST  ;  Surgeon: Irene Limbo, MD;  Location: [facility];  Service: Plastics;  Laterality: Left;  Marland Kitchen Mastopexy Right 09/03/2014    Procedure: RIGHT BREAST MASTOPEXY ;  Surgeon: Irene Limbo, MD;  Location: [facility];  Service: Plastics;  Laterality: Right;    FAMILY HISTORY Family History  Problem Relation Age of Onset  . Breast cancer Mother 52  . Heart disease Father   . Heart attack Father   . Breast cancer Maternal Grandmother 22  . Brain cancer Maternal Uncle 74    benign brain tumor  . Heart attack Paternal Aunt     GYNECOLOGIC HISTORY:   menarche age 58, first live birth age 73. The patient is GX P2. She went through the change of life approximately 2011.   SOCIAL HISTORY:  The patient worked previously as a Marine scientist. She has 2 children both of whom live in [place], works for the [employer], and [employer], a Librarian, academic. They are in their early 70s. There are no grandchildren. The patient married [name] (both women are originally from [place]) in [place]. [place] 5 [place], 10 chickens and 2 courses. They're not church attenders    ADVANCED DIRECTIVES:    HEALTH MAINTENANCE: History  Substance Use Topics  . Smoking status: Never Smoker   . Smokeless tobacco: Never Used  . Alcohol Use: Yes     Comment: 2 drinks/day     Colonoscopy:  PAP:  Bone density:  Lipid panel:  Allergies  Allergen Reactions  . Adhesive [Tape] Rash    Band-aids    Current Outpatient Prescriptions  Medication Sig Dispense Refill  .  anastrozole (ARIMIDEX) 1 MG tablet Take 1 tablet (1 mg total) by mouth daily. 90 tablet 4  . lidocaine-prilocaine (EMLA) cream Apply 1 application topically as needed (over port). 30 g 4  . trastuzumab (HERCEPTIN) 440 MG injection Inject 504 mg into the vein once. For 30 mins     No current facility-administered medications for this visit.    OBJECTIVE: middle-aged white woman who appears well Filed Vitals:   12/06/14 0957  BP: 136/79  Pulse: 58  Temp: 98.1 F (36.7 C)  Resp: 18     Body mass index is 28.36 kg/(m^2).      ECOG FS:1 - Symptomatic but completely ambulatory  Skin: warm, dry  HEENT: sclerae anicteric, conjunctivae pink, oropharynx clear. No thrush or mucositis.  Lymph Nodes: No cervical or supraclavicular lymphadenopathy  Lungs: clear to auscultation bilaterally, no rales, wheezes, or rhonci  Heart: regular rate and rhythm  Abdomen: round, soft, non tender, positive bowel sounds  Musculoskeletal: No focal spinal tenderness, no peripheral edema  Neuro: non focal, well oriented, positive affect  Breast: left breast status post mastectomy and flap reconstruction with implant in place. No evidence of recurrent disease. Left axilla benign. Right breast unremarkable.   LAB RESULTS:  CMP     Component Value Date/Time   NA 141 12/06/2014 0929   NA 144 07/25/2014 1204   K 4.1 12/06/2014 0929   K 4.1 07/25/2014 1204   CL 104 07/25/2014 1204   CO2 26 12/06/2014 0929   CO2 27 07/25/2014 1204   GLUCOSE 103 12/06/2014 0929   GLUCOSE 103* 07/25/2014 1204   BUN 9.9 12/06/2014 0929   BUN 11 07/25/2014 1204   CREATININE 0.7 12/06/2014 0929   CREATININE 0.54 07/25/2014 1204   CALCIUM 9.5 12/06/2014 0929   CALCIUM 9.9 07/25/2014 1204   PROT 6.9 12/06/2014 0929   PROT 7.5 07/25/2014 1204   ALBUMIN 3.8 12/06/2014 0929   ALBUMIN 3.9 07/25/2014 1204   AST 35* 12/06/2014 0929   AST 35 07/25/2014 1204   ALT 64* 12/06/2014 0929   ALT 49* 07/25/2014 1204   ALKPHOS 106  12/06/2014 0929   ALKPHOS 104 07/25/2014 1204   BILITOT 0.37 12/06/2014 0929   BILITOT <0.2* 07/25/2014 1204   GFRNONAA >90 04/13/2014 0430   GFRAA >90 04/13/2014 0430    I No results found for: SPEP  Lab Results  Component Value Date   WBC 7.1 12/06/2014   NEUTROABS 4.8 12/06/2014   HGB 12.7 12/06/2014   HCT 38.0 12/06/2014   MCV 92.0 12/06/2014   PLT 364 12/06/2014      Chemistry      Component Value Date/Time   NA 141 12/06/2014 0929   NA 144 07/25/2014 1204   K 4.1 12/06/2014 0929   K 4.1 07/25/2014 1204   CL 104 07/25/2014 1204   CO2 26 12/06/2014 0929   CO2 27 07/25/2014 1204   BUN 9.9 12/06/2014 0929   BUN 11 07/25/2014 1204   CREATININE 0.7 12/06/2014 0929   CREATININE 0.54 07/25/2014 1204      Component Value Date/Time   CALCIUM 9.5 12/06/2014 0929   CALCIUM 9.9 07/25/2014 1204   ALKPHOS 106 12/06/2014 0929   ALKPHOS 104 07/25/2014 1204   AST 35* 12/06/2014 0929   AST 35 07/25/2014 1204   ALT 64* 12/06/2014 0929   ALT 49* 07/25/2014 1204   BILITOT 0.37 12/06/2014 0929   BILITOT <0.2* 07/25/2014 1204       No results found for: LABCA2  No components found for: LABCA125  No results for input(s): INR in the last 168 hours.  Urinalysis    Component Value Date/Time   LABSPEC 1.005 05/22/2014 1130   GLUCOSEU Negative 05/22/2014 1130   UROBILINOGEN 0.2 05/22/2014 1130   NITRITE Negative 05/22/2014 1130    STUDIES: Most recent echocardiogram on 10/21/14 showed an EF of 60-65%  Most recent bone density scan on 07/29/14 showed a t-score of -1.2 (osteopenia).  ASSESSMENT: 54 y.o. BRCA negative [place] woman, [place] speaker   (1) status post left lumpectomy and axillary lymph node dissection in 1998 in [place] for a stage II invasive ductal carcinoma, grade 2, treated with adjuvant chemotherapy (specific drugs not clear) and adjuvant radiation.  (2) status post left breast upper outer quadrant biopsy 10/04/2013 for a clinically mT2 N0,  stage IIA invasive ductal carcinoma, grade 2, estrogen receptor 100% positive, progesterone receptor 33% positive, with an MIB-1 of 15%, and HER-2 amplified; staging studies showed no evidence of metastatic disease  (3) started  on neoadjuvant carboplatin/ docetaxel/ trastuzumab/ pertuzumab 11/12/2013  (a) completed 4 cycles as of 05/06/201, after which docetaxel was discontinued because of neuropathy symptoms   (b) gemcitabine was given day 1 and day 8 with carboplatin day 1 in the final 2 cycles, completed 03/04/2014   (c) continues on trastuzumab every 21 days (through March 2016)-- most recent echo 06/28/2014 shows a normal EF  (4) left mastectomy  04/12/2014 showed residual mpT1c pNX invasive ductal carcinoma, grade 2, estrogen and progesterone receptor positive, HER-2 negative, with negative margins  (5) anastrozole started 07/14/2014  (a) bone density 07/29/2014 shows T score - 1.2 (mild osteopenia)  (6) 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   PLAN: Kelsey Mclaughlin looks and feels well today. The labs were reviewed in detail and were entirely stable. She will proceed with her final dose of trastuzumab today. She is tolerating the anastrozole well and has no complaints. She will continue this until November 2020, to complete 5 years of antiestrogen therapy.   Kelsey Mclaughlin is scheduled for a right mammogram early next week. She would like her port removed as soon as possible, and I have placed referral orders to Dr. Vonna Kotyk office .   Kelsey Mclaughlin will return in 6 months for labs and a follow up visit. She understands and agrees with this plan. She knows the goal of treatment in her case is cure. She has been encouraged to call with any issues that might arise before her next visit here.   Laurie Panda, NP   12/06/2014 10:01 AM

## 2014-12-06 NOTE — Telephone Encounter (Signed)
Gave relative avs report and appointments for sept 2016. Relative made aware that [facility] will contact her regarding port removal. Left message for Alvie Heidelberg at [facility] requesting appointment for port removal and send staff message via [facility].

## 2014-12-08 ENCOUNTER — Other Ambulatory Visit: Payer: Self-pay | Admitting: Oncology

## 2014-12-09 ENCOUNTER — Ambulatory Visit
Admission: RE | Admit: 2014-12-09 | Discharge: 2014-12-09 | Disposition: A | Discharge status: Home / Self Care | Payer: Federal, State, Local not specified - PPO | Source: Ambulatory Visit | Attending: Oncology | Admitting: Oncology

## 2014-12-09 DIAGNOSIS — Z9012 Acquired absence of left breast and nipple: Secondary | ICD-10-CM

## 2014-12-09 DIAGNOSIS — Z853 Personal history of malignant neoplasm of breast: Secondary | ICD-10-CM

## 2014-12-12 ENCOUNTER — Ambulatory Visit: Payer: Self-pay | Admitting: Surgery

## 2014-12-20 ENCOUNTER — Encounter (HOSPITAL_BASED_OUTPATIENT_CLINIC_OR_DEPARTMENT_OTHER): Payer: Self-pay | Admitting: *Deleted

## 2014-12-20 NOTE — Progress Notes (Signed)
Spouse says the last interpreter did not speak there language well-pt understands some english-they are refusing interpreter-will sign [employer]

## 2014-12-23 ENCOUNTER — Encounter (HOSPITAL_BASED_OUTPATIENT_CLINIC_OR_DEPARTMENT_OTHER): Payer: Self-pay | Admitting: *Deleted

## 2014-12-23 NOTE — H&P (Signed)
Kelsey Mclaughlin is an 54 y.o. female.   Chief Complaint: Port removal HPI  This is a 54 yo female that presents after diagnosis of left breast cancer at age 9. This was treated in [place] with lumpectomy and axillary lymph node dissection. She received 6 months of chemotherapy as well as radiation therapy. She presented in 2011 with a palpable mass in the left upper outer quadrant. She had mammograms and ultrasounds performed of this area but no biopsies were ever performed. Recently she moved to [place]. She underwent screening mammogram in December 2014 and was brought back for a diagnostic left mammogram for left breast asymmetry on 10/02/13. At 1:00 in the left breast about 2 cm from the nipple there is an irregular hypoechoic mass measuring 1 x 0.4 x 1.7 cm. 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 ER and PR are positive Ki-67 15%. MRI showed the recurrent carcinoma, as well as a suspicious mass 1 cm posterior to the biopsy site.  She underwent port placement and began neoadjuvant chemotherapy. She recently completed her chemotherapy in early July. She is also undergoing physical therapy for her left arm lymphedema. She underwent left mastectomy with lattisimus flap/ tissue expander reconstruction 7/15.   Past Medical History  Diagnosis Date  . Cancer 1998, 2015    breast  . Bronchitis     completed antibiotics 10/21/13/ states resolved  . Stroke 2004    TIA-  no problems since  . Lymph edema     lt arm    Past Surgical History  Procedure Laterality Date  . Breast surgery  1998    lumpectomy on left and removed lymphnodes in [place]  . Portacath placement Right 10/25/2013    Procedure: INSERTION PORT-A-CATH;  Surgeon: Imogene Burn. Amyrie Illingworth, MD;  Location: [facility];  Service: General;  Laterality: Right;  . Simple mastectomy with axillary sentinel node biopsy Left 04/12/2014    Procedure: MASTECTOMY;   Surgeon: Imogene Burn. Georgette Dover, MD;  Location: [facility];  Service: General;  Laterality: Left;  . Latissimus flap to breast Left 04/12/2014    Procedure: LEFT LATISSIMUS DORSI FLAP FOR LEFT BREAST RECONSTRUCTION AND PLACEMENT OF TISSUE EXPANDER;  Surgeon: Irene Limbo, MD;  Location: [facility];  Service: Plastics;  Laterality: Left;  . Tissue expander placement Left 04/12/2014    Procedure: TISSUE EXPANDER;  Surgeon: Irene Limbo, MD;  Location: [facility];  Service: Plastics;  Laterality: Left;  . Tissue expander placement Left 05/22/2014    Procedure: PLACEMENT OF TISSUE EXPANDER/I&D WASHOUT SEROMA;  Surgeon: Irene Limbo, MD;  Location: [facility];  Service: Plastics;  Laterality: Left;  . Breast implant exchange Left 09/03/2014    Procedure: LEFT REMOVAL TISSUE EXPANDERS WITH PLACEMENT OF SILICONE IMPLANT ;  Surgeon: Irene Limbo, MD;  Location: [facility];  Service: Plastics;  Laterality: Left;  . Liposuction with lipofilling Left 09/03/2014    Procedure:  LIPOFILLING TO LEFT CHEST  ;  Surgeon: Irene Limbo, MD;  Location: [facility];  Service: Plastics;  Laterality: Left;  Marland Kitchen Mastopexy Right 09/03/2014    Procedure: RIGHT BREAST MASTOPEXY ;  Surgeon: Irene Limbo, MD;  Location: [facility];  Service: Plastics;  Laterality: Right;    Family History  Problem Relation Age of Onset  . Breast cancer Mother 74  . Heart disease Father   . Heart attack Father   . Breast cancer Maternal Grandmother 23  .  Brain cancer Maternal Uncle 74    benign brain tumor  . Heart attack Paternal Aunt    Social History:  reports that she has never smoked. She has never used smokeless tobacco. She reports that she drinks alcohol. She reports that she does not use illicit drugs.  Allergies:  Allergies  Allergen Reactions  . Adhesive [Tape] Rash    Band-aids    No prescriptions prior to admission    No results found for this or any previous visit (from the  past 48 hour(s)). No results found.  ROS Kelsey Mclaughlin denies fevers, chills, nausea, vomiting, or changes in bowel or bladder habits. She has no shortness of breath, chest pain, cough, or palpitations. She has no headaches dizziness, unexplained weight loss, night sweats or fatigue. A detailed review of systems is otherwise stable. Height '5\' 7"'  (1.702 m), weight 81.647 kg (180 lb). Physical Exam  Skin: warm, dry  HEENT: sclerae anicteric, conjunctivae pink, oropharynx clear. No thrush or mucositis.  Lymph Nodes: No cervical or supraclavicular lymphadenopathy  Lungs: clear to auscultation bilaterally, no rales, wheezes, or rhonci  Heart: regular rate and rhythm  Abdomen: round, soft, non tender, positive bowel sounds  Musculoskeletal: No focal spinal tenderness, no peripheral edema  Neuro: non focal, well oriented, positive affect  Breast: left breast status post mastectomy and flap reconstruction with implant in place. No evidence of recurrent disease. Left axilla benign. Right breast unremarkable.  Right subclavian vein port site OK  Assessment/Plan Left breast cancer s/p left mastectomy with reconstruction  Now completed chemotherapy.  Proceed with port removal.  The surgical procedure has been discussed with the patient.  Potential risks, benefits, alternative treatments, and expected outcomes have been explained.  All of the patient's questions at this time have been answered.  The likelihood of reaching the patient's treatment goal is good.  The patient understand the proposed surgical procedure and wishes to proceed.   Karenann Mcgrory K. 12/23/2014, 6:39 PM

## 2014-12-24 ENCOUNTER — Ambulatory Visit (HOSPITAL_BASED_OUTPATIENT_CLINIC_OR_DEPARTMENT_OTHER)
Admission: RE | Admit: 2014-12-24 | Discharge: 2014-12-24 | Disposition: A | Discharge status: Home / Self Care | Payer: Federal, State, Local not specified - PPO | Source: Ambulatory Visit | Attending: Surgery | Admitting: Surgery

## 2014-12-24 ENCOUNTER — Encounter (HOSPITAL_BASED_OUTPATIENT_CLINIC_OR_DEPARTMENT_OTHER): Payer: Self-pay

## 2014-12-24 ENCOUNTER — Ambulatory Visit (HOSPITAL_BASED_OUTPATIENT_CLINIC_OR_DEPARTMENT_OTHER): Payer: Federal, State, Local not specified - PPO | Admitting: Anesthesiology

## 2014-12-24 ENCOUNTER — Encounter (HOSPITAL_BASED_OUTPATIENT_CLINIC_OR_DEPARTMENT_OTHER)
Admission: RE | Disposition: A | Discharge status: Home / Self Care | Payer: Self-pay | Source: Ambulatory Visit | Attending: Surgery

## 2014-12-24 DIAGNOSIS — I10 Essential (primary) hypertension: Secondary | ICD-10-CM | POA: Diagnosis not present

## 2014-12-24 DIAGNOSIS — Z9889 Other specified postprocedural states: Secondary | ICD-10-CM | POA: Insufficient documentation

## 2014-12-24 DIAGNOSIS — Z8673 Personal history of transient ischemic attack (TIA), and cerebral infarction without residual deficits: Secondary | ICD-10-CM | POA: Insufficient documentation

## 2014-12-24 DIAGNOSIS — Z9012 Acquired absence of left breast and nipple: Secondary | ICD-10-CM | POA: Insufficient documentation

## 2014-12-24 DIAGNOSIS — Z923 Personal history of irradiation: Secondary | ICD-10-CM | POA: Diagnosis not present

## 2014-12-24 DIAGNOSIS — Z9221 Personal history of antineoplastic chemotherapy: Secondary | ICD-10-CM | POA: Diagnosis not present

## 2014-12-24 DIAGNOSIS — Z853 Personal history of malignant neoplasm of breast: Secondary | ICD-10-CM | POA: Diagnosis not present

## 2014-12-24 DIAGNOSIS — Z452 Encounter for adjustment and management of vascular access device: Secondary | ICD-10-CM | POA: Insufficient documentation

## 2014-12-24 DIAGNOSIS — Z803 Family history of malignant neoplasm of breast: Secondary | ICD-10-CM | POA: Diagnosis not present

## 2014-12-24 HISTORY — PX: PORT-A-CATH REMOVAL: SHX5289

## 2014-12-24 SURGERY — REMOVAL PORT-A-CATH
Anesthesia: Monitor Anesthesia Care | Site: Chest | Laterality: Right

## 2014-12-24 MED ORDER — HYDROCODONE-ACETAMINOPHEN 5-325 MG PO TABS: 1.0000 | ORAL_TABLET | ORAL | Status: DC | PRN

## 2014-12-24 MED ORDER — PROPOFOL 10 MG/ML IV BOLUS
INTRAVENOUS | Status: DC | PRN
  Administered 2014-12-24 (×3): 20 mg via INTRAVENOUS

## 2014-12-24 MED ORDER — MIDAZOLAM HCL 5 MG/5ML IJ SOLN
INTRAMUSCULAR | Status: DC | PRN
  Administered 2014-12-24: 2 mg via INTRAVENOUS

## 2014-12-24 MED ORDER — FENTANYL CITRATE 0.05 MG/ML IJ SOLN
INTRAMUSCULAR | Status: DC | PRN
  Administered 2014-12-24 (×2): 50 ug via INTRAVENOUS

## 2014-12-24 MED ORDER — BUPIVACAINE-EPINEPHRINE 0.25% -1:200000 IJ SOLN
INTRAMUSCULAR | Status: DC | PRN
  Administered 2014-12-24: 7 mL

## 2014-12-24 MED ORDER — FENTANYL CITRATE 0.05 MG/ML IJ SOLN: 50.0000 ug | INTRAMUSCULAR | Status: DC | PRN

## 2014-12-24 MED ORDER — MIDAZOLAM HCL 2 MG/2ML IJ SOLN
INTRAMUSCULAR | Status: AC
  Filled 2014-12-24: qty 2

## 2014-12-24 MED ORDER — CHLORHEXIDINE GLUCONATE 4 % EX LIQD: 1.0000 "application " | Freq: Once | CUTANEOUS | Status: DC

## 2014-12-24 MED ORDER — BUPIVACAINE-EPINEPHRINE (PF) 0.25% -1:200000 IJ SOLN
INTRAMUSCULAR | Status: AC
  Filled 2014-12-24: qty 30

## 2014-12-24 MED ORDER — MORPHINE SULFATE 2 MG/ML IJ SOLN: 2.0000 mg | INTRAMUSCULAR | Status: DC | PRN

## 2014-12-24 MED ORDER — LACTATED RINGERS IV SOLN
INTRAVENOUS | Status: DC
  Administered 2014-12-24: 07:00:00 via INTRAVENOUS

## 2014-12-24 MED ORDER — FENTANYL CITRATE 0.05 MG/ML IJ SOLN
INTRAMUSCULAR | Status: AC
  Filled 2014-12-24: qty 6

## 2014-12-24 MED ORDER — ONDANSETRON HCL 4 MG/2ML IJ SOLN: 4.0000 mg | INTRAMUSCULAR | Status: DC | PRN

## 2014-12-24 MED ORDER — MIDAZOLAM HCL 2 MG/2ML IJ SOLN: 1.0000 mg | INTRAMUSCULAR | Status: DC | PRN

## 2014-12-24 MED ORDER — DEXAMETHASONE SODIUM PHOSPHATE 10 MG/ML IJ SOLN
INTRAMUSCULAR | Status: DC | PRN
Start: 2014-12-24 — End: 2014-12-24
  Administered 2014-12-24: 10 mg via INTRAVENOUS

## 2014-12-24 SURGICAL SUPPLY — 41 items
APPLICATOR COTTON TIP 6IN STRL (MISCELLANEOUS) IMPLANT
BENZOIN TINCTURE PRP APPL 2/3 (GAUZE/BANDAGES/DRESSINGS) ×2 IMPLANT
BLADE HEX COATED 2.75 (ELECTRODE) ×2 IMPLANT
BLADE SURG 15 STRL LF DISP TIS (BLADE) ×1 IMPLANT
BLADE SURG 15 STRL SS (BLADE) ×1
CANISTER SUCT 1200ML W/VALVE (MISCELLANEOUS) IMPLANT
CHLORAPREP W/TINT 26ML (MISCELLANEOUS) ×2 IMPLANT
COVER BACK TABLE 60X90IN (DRAPES) ×2 IMPLANT
COVER MAYO STAND STRL (DRAPES) ×2 IMPLANT
DECANTER SPIKE VIAL GLASS SM (MISCELLANEOUS) IMPLANT
DRAPE LAPAROTOMY 100X72 PEDS (DRAPES) ×2 IMPLANT
DRAPE UTILITY XL STRL (DRAPES) ×2 IMPLANT
DRSG TEGADERM 4X4.75 (GAUZE/BANDAGES/DRESSINGS) ×2 IMPLANT
ELECT REM PT RETURN 9FT ADLT (ELECTROSURGICAL) ×2
ELECTRODE REM PT RTRN 9FT ADLT (ELECTROSURGICAL) ×1 IMPLANT
GLOVE BIO SURGEON STRL SZ7 (GLOVE) ×4 IMPLANT
GLOVE BIOGEL PI IND STRL 6.5 (GLOVE) ×1 IMPLANT
GLOVE BIOGEL PI IND STRL 7.5 (GLOVE) ×1 IMPLANT
GLOVE BIOGEL PI INDICATOR 6.5 (GLOVE) ×1
GLOVE BIOGEL PI INDICATOR 7.5 (GLOVE) ×1
GLOVE SURG SS PI 6.5 STRL IVOR (GLOVE) ×2 IMPLANT
GOWN STRL REUS W/ TWL LRG LVL3 (GOWN DISPOSABLE) ×2 IMPLANT
GOWN STRL REUS W/ TWL XL LVL3 (GOWN DISPOSABLE) ×1 IMPLANT
GOWN STRL REUS W/TWL LRG LVL3 (GOWN DISPOSABLE) ×2
GOWN STRL REUS W/TWL XL LVL3 (GOWN DISPOSABLE) ×1
NEEDLE HYPO 25X1 1.5 SAFETY (NEEDLE) ×2 IMPLANT
NS IRRIG 1000ML POUR BTL (IV SOLUTION) IMPLANT
PACK BASIN DAY SURGERY FS (CUSTOM PROCEDURE TRAY) ×2 IMPLANT
PENCIL BUTTON HOLSTER BLD 10FT (ELECTRODE) ×2 IMPLANT
SPONGE GAUZE 2X2 8PLY STRL LF (GAUZE/BANDAGES/DRESSINGS) IMPLANT
SPONGE GAUZE 4X4 12PLY STER LF (GAUZE/BANDAGES/DRESSINGS) IMPLANT
SPONGE LAP 4X18 X RAY DECT (DISPOSABLE) ×2 IMPLANT
STRIP CLOSURE SKIN 1/2X4 (GAUZE/BANDAGES/DRESSINGS) ×2 IMPLANT
SUT MON AB 4-0 PC3 18 (SUTURE) ×2 IMPLANT
SUT VIC AB 3-0 SH 27 (SUTURE) ×1
SUT VIC AB 3-0 SH 27X BRD (SUTURE) ×1 IMPLANT
SYR CONTROL 10ML LL (SYRINGE) ×2 IMPLANT
TOWEL OR 17X24 6PK STRL BLUE (TOWEL DISPOSABLE) ×4 IMPLANT
TOWEL OR NON WOVEN STRL DISP B (DISPOSABLE) ×2 IMPLANT
TUBE CONNECTING 20X1/4 (TUBING) IMPLANT
YANKAUER SUCT BULB TIP NO VENT (SUCTIONS) IMPLANT

## 2014-12-24 NOTE — Op Note (Signed)
Pre-op diagnosis:  Left breast cancer; completed chemo Post-op diagnosis:  Same Procedure:  Right subclavian vein port removal Surgeon:  Nolon Yellin K. Anesthesia:  Local-MAC Indications:  54 yo female s/p left mastectomy with reconstruction for recurrent left breast cancer now presents for port removal after chemotherapy. Description:  The patient was brought to the operating room and placed in a supine position on the operating table.  After an adequate level of intravenous sedation was given, her right chest was prepped with Chloraprep and draped in sterile fashion.  The area over the port was infiltrated with local anesthetic.  The incision was opened and dissection was carried down to the port with cautery.  The catheter was removed from the subclavian vein and direct pressure was held.  The Prolene sutures were cut and the port was removed.  Part of the capsule was excised.  Hemostasis was good.  The wound was closed with 3-0 Vicryl and 4-0 Monocryl.  Steri-strips and an occlusive dressing were applied.  Counts were correct.  Imogene Burn. Georgette Dover, MD, [facility]  General/ Trauma Surgery  12/24/2014 7:53 AM

## 2014-12-24 NOTE — Anesthesia Postprocedure Evaluation (Signed)
  Anesthesia Post-op Note  Patient: Kelsey Mclaughlin  Procedure(s) Performed: Procedure(s): REMOVAL PORT-A-CATH (Right)  Patient Location: PACU  Anesthesia Type:MAC  Level of Consciousness: awake and alert   Airway and Oxygen Therapy: Patient Spontanous Breathing  Post-op Pain: none  Post-op Assessment: Post-op Vital signs reviewed  Post-op Vital Signs: stable  Last Vitals:  Filed Vitals:   12/24/14 0819  BP: 133/84  Pulse: 72  Temp: 36.5 C  Resp: 18    Complications: No apparent anesthesia complications

## 2014-12-24 NOTE — Anesthesia Procedure Notes (Addendum)
Procedure Name: MAC Date/Time: 12/24/2014 7:29 AM Performed by: Marrianne Mood Pre-anesthesia Checklist: Patient identified, Timeout performed, Emergency Drugs available, Suction available and Patient being monitored Patient Re-evaluated:Patient Re-evaluated prior to inductionOxygen Delivery Method: Simple face mask   Performed by: Marrianne Mood

## 2014-12-24 NOTE — Discharge Instructions (Signed)
Remove dressing in two days, then you may shower over the steri-strips. The steri-strips will fall off over the next 1-2 weeks.  You may apply ice to this area as needed.  You may use your leftover pain medication.    Post Anesthesia Home Care Instructions  Activity: Get plenty of rest for the remainder of the day. A responsible adult should stay with you for 24 hours following the procedure.  For the next 24 hours, DO NOT: -Drive a car -Paediatric nurse -Drink alcoholic beverages -Take any medication unless instructed by your physician -Make any legal decisions or sign important papers.  Meals: Start with liquid foods such as gelatin or soup. Progress to regular foods as tolerated. Avoid greasy, spicy, heavy foods. If nausea and/or vomiting occur, drink only clear liquids until the nausea and/or vomiting subsides. Call your physician if vomiting continues.  Special Instructions/Symptoms: Your throat may feel dry or sore from the anesthesia or the breathing tube placed in your throat during surgery. If this causes discomfort, gargle with warm salt water. The discomfort should disappear within 24 hours.  If you had a scopolamine patch placed behind your ear for the management of post- operative nausea and/or vomiting:  1. The medication in the patch is effective for 72 hours, after which it should be removed.  Wrap patch in a tissue and discard in the trash. Wash hands thoroughly with soap and water. 2. You may remove the patch earlier than 72 hours if you experience unpleasant side effects which may include dry mouth, dizziness or visual disturbances. 3. Avoid touching the patch. Wash your hands with soap and water after contact with the patch.

## 2014-12-24 NOTE — Interval H&P Note (Signed)
History and Physical Interval Note:  9/44/4619 0:12 AM  Kelsey Mclaughlin  has presented today for surgery, with the diagnosis of Breast Cancer  The various methods of treatment have been discussed with the patient and family. After consideration of risks, benefits and other options for treatment, the patient has consented to  Procedure(s): REMOVAL PORT-A-CATH (N/A) as a surgical intervention .  The patient's history has been reviewed, patient examined, no change in status, stable for surgery.  I have reviewed the patient's chart and labs.  Questions were answered to the patient's satisfaction.     Juno Bozard K.

## 2014-12-24 NOTE — Anesthesia Preprocedure Evaluation (Signed)
Anesthesia Evaluation  Patient identified by MRN, date of birth, ID band Patient awake    Reviewed: Allergy & Precautions, H&P , NPO status , Patient's Chart, lab work & pertinent test results  History of Anesthesia Complications Negative for: history of anesthetic complications  Airway Mallampati: II  TM Distance: >3 FB Neck ROM: Full    Dental  (+) Teeth Intact, Dental Advisory Given   Pulmonary neg pulmonary ROS,  breath sounds clear to auscultation        Cardiovascular hypertension (not on meds since chemo), Rhythm:Regular Rate:Normal  7/15 ECHO: EF 55-60%, trivial MR   Neuro/Psych TIA   GI/Hepatic negative GI ROS, Neg liver ROS,   Endo/Other  negative endocrine ROS  Renal/GU      Musculoskeletal   Abdominal   Peds  Hematology   Anesthesia Other Findings Breast cancer: surgery, chemo  Reproductive/Obstetrics                             Anesthesia Physical Anesthesia Plan  ASA: II  Anesthesia Plan: MAC and General LMA   Post-op Pain Management:    Induction:   Airway Management Planned:   Additional Equipment:   Intra-op Plan:   Post-operative Plan:   Informed Consent: I have reviewed the patients History and Physical, chart, labs and discussed the procedure including the risks, benefits and alternatives for the proposed anesthesia with the patient or authorized representative who has indicated his/her understanding and acceptance.     Plan Discussed with: CRNA and Surgeon  Anesthesia Plan Comments:         Anesthesia Quick Evaluation

## 2014-12-24 NOTE — Transfer of Care (Signed)
Immediate Anesthesia Transfer of Care Note  Patient: Kelsey Mclaughlin  Procedure(s) Performed: Procedure(s): REMOVAL PORT-A-CATH (Right)  Patient Location: PACU  Anesthesia Type:MAC  Level of Consciousness: awake, alert  and oriented  Airway & Oxygen Therapy: Patient Spontanous Breathing and Patient connected to face mask oxygen  Post-op Assessment: Report given to RN and Post -op Vital signs reviewed and stable  Post vital signs: Reviewed and stable  Last Vitals:  Filed Vitals:   12/24/14 0628  BP: 137/74  Pulse: 58  Temp: 36.7 C  Resp: 20    Complications: No apparent anesthesia complications

## 2014-12-25 ENCOUNTER — Encounter (HOSPITAL_BASED_OUTPATIENT_CLINIC_OR_DEPARTMENT_OTHER): Payer: Self-pay | Admitting: Surgery

## 2015-01-14 DIAGNOSIS — Z923 Personal history of irradiation: Secondary | ICD-10-CM | POA: Insufficient documentation

## 2015-02-28 NOTE — H&P (Signed)
  Subjective:    Patient ID: Kelsey Mclaughlin is a 54 y.o. female.  HPI 5.5 months post implant exchange, lipofilling and right mastopexy.   Post op from latissimus/TE reconstruction at time of mastectomy. Course complicated by seroma and return to OR for drainage and implant exchange.   Final pathology with two foci IDC 1.3 and 1.0 cm, margins negative. +lymphovascular invasion. Plan for AI as adjuvant treatment. The patient was originally diagnosed at age 1 with invasive ductal carcinoma in the left upper outer quadrant near the axilla. The patient was in [place] and underwent lumpectomy and full axillary lymph node dissection with ALND, XRT, chemotherapy. 2/26 lymph nodes positive Screening [facility] in December 2014 demonstrated breast asymmetry and follow up biospy demonstrated mass measuring 1 x 0.4 x 1.7 cm 2 cm from the nipple. , IDC, ER/PR +, Her 2 +. She completed neoadjuvant chemotherapy. 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 She has lymphedema of LUE and uses sleeve and compression pump.   Prior bra C cup on larger R breast side.   Review of Systems     Objective:   Physical Exam  Cardiovascular: Normal rate, regular rhythm and normal heart sounds.  Pulmonary/Chest: Effort normal and breath sounds normal.  Abdominal: Soft.  No depressions post liposuction/harvest   Breasts soft, left with depression superior pole but shape and volume symmetric Left back donor site anterior extent scar with some fullness   Assessment:     Post op implant exchange, lipofilling and right mastopexy Post mastectomy with latissimus/TE reconstruction Recurrent left breast cancer   Plan:  Will plan repeat lipofilling and NAC creation. Plan FTSG/local flap for reconstruction. [place] after son's marriage.  Will also plan to revise scar left back donor site, anterior extent only, with excision. Plan lipofilling to superior poles and left lateral chest wall.     Mentor Smooth Ultra High Profile 031 ml silicone implant placed in left chest.      Irene Limbo, MD [facility]astic & Reconstruct[facility] Surgery [phone]

## 2015-03-05 ENCOUNTER — Encounter (HOSPITAL_BASED_OUTPATIENT_CLINIC_OR_DEPARTMENT_OTHER): Payer: Self-pay | Admitting: *Deleted

## 2015-03-07 ENCOUNTER — Ambulatory Visit (HOSPITAL_BASED_OUTPATIENT_CLINIC_OR_DEPARTMENT_OTHER): Payer: Federal, State, Local not specified - PPO | Admitting: Anesthesiology

## 2015-03-07 ENCOUNTER — Ambulatory Visit (HOSPITAL_BASED_OUTPATIENT_CLINIC_OR_DEPARTMENT_OTHER)
Admission: RE | Admit: 2015-03-07 | Discharge: 2015-03-07 | Disposition: A | Discharge status: Home / Self Care | Payer: Federal, State, Local not specified - PPO | Source: Ambulatory Visit | Attending: Plastic Surgery | Admitting: Plastic Surgery

## 2015-03-07 ENCOUNTER — Encounter (HOSPITAL_BASED_OUTPATIENT_CLINIC_OR_DEPARTMENT_OTHER): Payer: Self-pay

## 2015-03-07 ENCOUNTER — Encounter (HOSPITAL_BASED_OUTPATIENT_CLINIC_OR_DEPARTMENT_OTHER)
Admission: RE | Disposition: A | Discharge status: Home / Self Care | Payer: Self-pay | Source: Ambulatory Visit | Attending: Plastic Surgery

## 2015-03-07 DIAGNOSIS — Z421 Encounter for breast reconstruction following mastectomy: Secondary | ICD-10-CM | POA: Diagnosis present

## 2015-03-07 DIAGNOSIS — Z853 Personal history of malignant neoplasm of breast: Secondary | ICD-10-CM | POA: Insufficient documentation

## 2015-03-07 DIAGNOSIS — L905 Scar conditions and fibrosis of skin: Secondary | ICD-10-CM | POA: Diagnosis not present

## 2015-03-07 DIAGNOSIS — Z923 Personal history of irradiation: Secondary | ICD-10-CM | POA: Diagnosis not present

## 2015-03-07 DIAGNOSIS — Z9012 Acquired absence of left breast and nipple: Secondary | ICD-10-CM | POA: Insufficient documentation

## 2015-03-07 DIAGNOSIS — Z8673 Personal history of transient ischemic attack (TIA), and cerebral infarction without residual deficits: Secondary | ICD-10-CM | POA: Insufficient documentation

## 2015-03-07 HISTORY — PX: BREAST RECONSTRUCTION: SHX9

## 2015-03-07 HISTORY — PX: LIPOSUCTION WITH LIPOFILLING: SHX6436

## 2015-03-07 LAB — POCT HEMOGLOBIN-HEMACUE: Hemoglobin: 12 g/dL (ref 12.0–15.0)

## 2015-03-07 SURGERY — LIPOSUCTION, WITH FAT TRANSFER
Anesthesia: General | Site: Breast | Laterality: Left

## 2015-03-07 MED ORDER — DEXAMETHASONE SODIUM PHOSPHATE 4 MG/ML IJ SOLN
INTRAMUSCULAR | Status: DC | PRN
  Administered 2015-03-07: 10 mg via INTRAVENOUS

## 2015-03-07 MED ORDER — GLYCOPYRROLATE 0.2 MG/ML IJ SOLN: 0.2000 mg | Freq: Once | INTRAMUSCULAR | Status: DC | PRN

## 2015-03-07 MED ORDER — HYDROMORPHONE HCL 1 MG/ML IJ SOLN
INTRAMUSCULAR | Status: AC
  Filled 2015-03-07: qty 1

## 2015-03-07 MED ORDER — PROPOFOL 10 MG/ML IV BOLUS
INTRAVENOUS | Status: DC | PRN
  Administered 2015-03-07: 200 mg via INTRAVENOUS

## 2015-03-07 MED ORDER — HYDROMORPHONE HCL 1 MG/ML IJ SOLN
0.2500 mg | INTRAMUSCULAR | Status: DC | PRN
  Administered 2015-03-07 (×2): 0.5 mg via INTRAVENOUS

## 2015-03-07 MED ORDER — PROMETHAZINE HCL 25 MG/ML IJ SOLN: 6.2500 mg | INTRAMUSCULAR | Status: DC | PRN

## 2015-03-07 MED ORDER — MIDAZOLAM HCL 2 MG/2ML IJ SOLN
1.0000 mg | INTRAMUSCULAR | Status: DC | PRN
  Administered 2015-03-07: 2 mg via INTRAVENOUS

## 2015-03-07 MED ORDER — LACTATED RINGERS IV SOLN: INTRAVENOUS | Status: DC | PRN

## 2015-03-07 MED ORDER — SODIUM BICARBONATE 4 % IV SOLN
INTRAVENOUS | Status: DC | PRN
  Administered 2015-03-07: 10 mL via INTRAVENOUS

## 2015-03-07 MED ORDER — MIDAZOLAM HCL 2 MG/2ML IJ SOLN
INTRAMUSCULAR | Status: AC
  Filled 2015-03-07: qty 2

## 2015-03-07 MED ORDER — CEFAZOLIN SODIUM-DEXTROSE 2-3 GM-% IV SOLR
INTRAVENOUS | Status: AC
  Filled 2015-03-07: qty 50

## 2015-03-07 MED ORDER — LIDOCAINE HCL (CARDIAC) 20 MG/ML IV SOLN
INTRAVENOUS | Status: DC | PRN
  Administered 2015-03-07: 75 mg via INTRAVENOUS

## 2015-03-07 MED ORDER — EPINEPHRINE HCL 1 MG/ML IJ SOLN
INTRAMUSCULAR | Status: AC
  Filled 2015-03-07: qty 1

## 2015-03-07 MED ORDER — SODIUM BICARBONATE 4 % IV SOLN
INTRAVENOUS | Status: AC
  Filled 2015-03-07: qty 5

## 2015-03-07 MED ORDER — CHLORHEXIDINE GLUCONATE 4 % EX LIQD: 1.0000 "application " | Freq: Once | CUTANEOUS | Status: DC

## 2015-03-07 MED ORDER — LIDOCAINE-EPINEPHRINE 1 %-1:100000 IJ SOLN
INTRAMUSCULAR | Status: AC
  Filled 2015-03-07: qty 2

## 2015-03-07 MED ORDER — CEFAZOLIN SODIUM-DEXTROSE 2-3 GM-% IV SOLR
2.0000 g | INTRAVENOUS | Status: AC
  Administered 2015-03-07: 2 g via INTRAVENOUS

## 2015-03-07 MED ORDER — SUFENTANIL CITRATE 50 MCG/ML IV SOLN
INTRAVENOUS | Status: AC
  Filled 2015-03-07: qty 1

## 2015-03-07 MED ORDER — LACTATED RINGERS IV SOLN
INTRAVENOUS | Status: DC
  Administered 2015-03-07 (×2): via INTRAVENOUS

## 2015-03-07 MED ORDER — EPINEPHRINE HCL 1 MG/ML IJ SOLN: INTRAMUSCULAR | Status: DC | PRN

## 2015-03-07 MED ORDER — BUPIVACAINE-EPINEPHRINE (PF) 0.25% -1:200000 IJ SOLN
INTRAMUSCULAR | Status: AC
  Filled 2015-03-07: qty 60

## 2015-03-07 MED ORDER — FENTANYL CITRATE (PF) 100 MCG/2ML IJ SOLN: 50.0000 ug | INTRAMUSCULAR | Status: DC | PRN

## 2015-03-07 MED ORDER — LIDOCAINE HCL (PF) 1 % IJ SOLN
INTRAMUSCULAR | Status: AC
  Filled 2015-03-07: qty 120

## 2015-03-07 MED ORDER — OXYMETAZOLINE HCL 0.05 % NA SOLN
NASAL | Status: AC
  Filled 2015-03-07: qty 15

## 2015-03-07 MED ORDER — LIDOCAINE HCL 1 % IJ SOLN
INTRAVENOUS | Status: DC | PRN
  Administered 2015-03-07: 300 mL via INTRAMUSCULAR
  Administered 2015-03-07: 09:00:00 via INTRAMUSCULAR

## 2015-03-07 MED ORDER — SUFENTANIL CITRATE 50 MCG/ML IV SOLN
INTRAVENOUS | Status: DC | PRN
  Administered 2015-03-07 (×2): 5 ug via INTRAVENOUS
  Administered 2015-03-07: 10 ug via INTRAVENOUS

## 2015-03-07 MED ORDER — ONDANSETRON HCL 4 MG/2ML IJ SOLN
INTRAMUSCULAR | Status: DC | PRN
  Administered 2015-03-07: 4 mg via INTRAVENOUS

## 2015-03-07 MED ORDER — HYDROCODONE-ACETAMINOPHEN 10-325 MG PO TABS: 1.0000 | ORAL_TABLET | ORAL | Status: DC | PRN

## 2015-03-07 MED ORDER — SCOPOLAMINE 1 MG/3DAYS TD PT72: 1.0000 | MEDICATED_PATCH | Freq: Once | TRANSDERMAL | Status: DC | PRN

## 2015-03-07 MED ORDER — BACITRACIN-NEOMYCIN-POLYMYXIN 400-5-5000 EX OINT
TOPICAL_OINTMENT | CUTANEOUS | Status: AC
  Filled 2015-03-07: qty 2

## 2015-03-07 MED ORDER — LIDOCAINE HCL 1 % IJ SOLN: INTRAMUSCULAR | Status: DC | PRN

## 2015-03-07 MED ORDER — SODIUM BICARBONATE 4 % IV SOLN: INTRAVENOUS | Status: DC | PRN

## 2015-03-07 MED ORDER — SODIUM BICARBONATE 4 % IV SOLN
INTRAVENOUS | Status: AC
  Filled 2015-03-07: qty 10

## 2015-03-07 SURGICAL SUPPLY — 88 items
BINDER ABDOMINAL 10 UNV 27-48 (MISCELLANEOUS) ×2 IMPLANT
BINDER ABDOMINAL 12 SM 30-45 (SOFTGOODS) ×2 IMPLANT
BINDER BREAST LRG (GAUZE/BANDAGES/DRESSINGS) IMPLANT
BINDER BREAST MEDIUM (GAUZE/BANDAGES/DRESSINGS) IMPLANT
BINDER BREAST XLRG (GAUZE/BANDAGES/DRESSINGS) ×2 IMPLANT
BINDER BREAST XXLRG (GAUZE/BANDAGES/DRESSINGS) IMPLANT
BLADE CLIPPER SURG (BLADE) IMPLANT
BLADE SURG 10 STRL SS (BLADE) ×2 IMPLANT
BLADE SURG 11 STRL SS (BLADE) ×2 IMPLANT
BLADE SURG 15 STRL LF DISP TIS (BLADE) ×1 IMPLANT
BLADE SURG 15 STRL SS (BLADE) ×1
BNDG GAUZE ELAST 4 BULKY (GAUZE/BANDAGES/DRESSINGS) IMPLANT
BRUSH SCRUB EZ PLAIN DRY (MISCELLANEOUS) ×2 IMPLANT
CANISTER LIPO FAT HARVEST (MISCELLANEOUS) ×2 IMPLANT
CANISTER SUCT 1200ML W/VALVE (MISCELLANEOUS) ×2 IMPLANT
CHLORAPREP W/TINT 26ML (MISCELLANEOUS) ×4 IMPLANT
COTTONBALL LRG STERILE PKG (GAUZE/BANDAGES/DRESSINGS) IMPLANT
COVER BACK TABLE 60X90IN (DRAPES) ×2 IMPLANT
COVER MAYO STAND STRL (DRAPES) ×2 IMPLANT
DECANTER SPIKE VIAL GLASS SM (MISCELLANEOUS) IMPLANT
DRAIN CHANNEL 15F RND FF W/TCR (WOUND CARE) IMPLANT
DRAPE LAPAROSCOPIC ABDOMINAL (DRAPES) ×2 IMPLANT
DRAPE SURG 17X23 STRL (DRAPES) IMPLANT
DRSG EMULSION OIL 3X3 NADH (GAUZE/BANDAGES/DRESSINGS) ×2 IMPLANT
DRSG PAD ABDOMINAL 8X10 ST (GAUZE/BANDAGES/DRESSINGS) ×2 IMPLANT
DRSG TEGADERM 2-3/8X2-3/4 SM (GAUZE/BANDAGES/DRESSINGS) IMPLANT
ELECT COATED BLADE 2.86 ST (ELECTRODE) ×2 IMPLANT
ELECT NEEDLE BLADE 2-5/6 (NEEDLE) ×2 IMPLANT
ELECT REM PT RETURN 9FT ADLT (ELECTROSURGICAL) ×2
ELECTRODE REM PT RTRN 9FT ADLT (ELECTROSURGICAL) ×1 IMPLANT
EVACUATOR SILICONE 100CC (DRAIN) IMPLANT
FILTER LIPOSUCTION (MISCELLANEOUS) ×2 IMPLANT
GAUZE XEROFORM 1X8 LF (GAUZE/BANDAGES/DRESSINGS) IMPLANT
GAUZE XEROFORM 5X9 LF (GAUZE/BANDAGES/DRESSINGS) IMPLANT
GLOVE BIO SURGEON STRL SZ 6 (GLOVE) ×4 IMPLANT
GLOVE BIO SURGEON STRL SZ 6.5 (GLOVE) IMPLANT
GLOVE BIOGEL PI IND STRL 7.0 (GLOVE) ×2 IMPLANT
GLOVE BIOGEL PI INDICATOR 7.0 (GLOVE) ×2
GLOVE ECLIPSE 6.5 STRL STRAW (GLOVE) ×2 IMPLANT
GLOVE SS BIOGEL STRL SZ 7.5 (GLOVE) ×1 IMPLANT
GLOVE SUPERSENSE BIOGEL SZ 7.5 (GLOVE) ×1
GOWN STRL REUS W/ TWL LRG LVL3 (GOWN DISPOSABLE) ×2 IMPLANT
GOWN STRL REUS W/TWL LRG LVL3 (GOWN DISPOSABLE) ×2
LINER CANISTER 1000CC FLEX (MISCELLANEOUS) ×2 IMPLANT
LIQUID BAND (GAUZE/BANDAGES/DRESSINGS) ×6 IMPLANT
NDL SAFETY ECLIPSE 18X1.5 (NEEDLE) ×1 IMPLANT
NEEDLE HYPO 18GX1.5 SHARP (NEEDLE) ×1
NEEDLE PRECISIONGLIDE 27X1.5 (NEEDLE) IMPLANT
NS IRRIG 1000ML POUR BTL (IV SOLUTION) ×4 IMPLANT
PACK BASIN DAY SURGERY FS (CUSTOM PROCEDURE TRAY) ×2 IMPLANT
PACK UNIVERSAL I (CUSTOM PROCEDURE TRAY) ×2 IMPLANT
PAD ALCOHOL SWAB (MISCELLANEOUS) ×2 IMPLANT
PENCIL BUTTON HOLSTER BLD 10FT (ELECTRODE) ×2 IMPLANT
SHEET MEDIUM DRAPE 40X70 STRL (DRAPES) ×6 IMPLANT
SLEEVE SCD COMPRESS KNEE MED (MISCELLANEOUS) ×2 IMPLANT
SPONGE GAUZE 2X2 8PLY STRL LF (GAUZE/BANDAGES/DRESSINGS) IMPLANT
SPONGE LAP 18X18 X RAY DECT (DISPOSABLE) ×6 IMPLANT
STAPLER VISISTAT 35W (STAPLE) ×2 IMPLANT
STRIP CLOSURE SKIN 1/2X4 (GAUZE/BANDAGES/DRESSINGS) IMPLANT
STRIP CLOSURE SKIN 1/4X4 (GAUZE/BANDAGES/DRESSINGS) IMPLANT
SUT ETHILON 2 0 FS 18 (SUTURE) ×2 IMPLANT
SUT MNCRL AB 4-0 PS2 18 (SUTURE) ×2 IMPLANT
SUT PLAIN 5 0 P 3 18 (SUTURE) ×6 IMPLANT
SUT PROLENE 2 0 CT2 30 (SUTURE) IMPLANT
SUT PROLENE 5 0 P 3 (SUTURE) IMPLANT
SUT PROLENE 5 0 PS 2 (SUTURE) IMPLANT
SUT PROLENE 6 0 P 1 18 (SUTURE) IMPLANT
SUT SILK 4 0 PS 2 (SUTURE) ×4 IMPLANT
SUT VIC AB 3-0 PS1 18 (SUTURE) ×1
SUT VIC AB 3-0 PS1 18XBRD (SUTURE) ×1 IMPLANT
SUT VIC AB 3-0 SH 27 (SUTURE)
SUT VIC AB 3-0 SH 27X BRD (SUTURE) IMPLANT
SUT VICRYL 4-0 PS2 18IN ABS (SUTURE) IMPLANT
SUT VICRYL RAPID 5 0 P 3 (SUTURE) ×2 IMPLANT
SYR 20CC LL (SYRINGE) IMPLANT
SYR 50ML LL SCALE MARK (SYRINGE) ×4 IMPLANT
SYR BULB 3OZ (MISCELLANEOUS) IMPLANT
SYR BULB IRRIGATION 50ML (SYRINGE) ×2 IMPLANT
SYR CONTROL 10ML LL (SYRINGE) IMPLANT
SYR TB 1ML LL NO SAFETY (SYRINGE) ×2 IMPLANT
SYRINGE 10CC LL (SYRINGE) ×6 IMPLANT
TOWEL OR 17X24 6PK STRL BLUE (TOWEL DISPOSABLE) ×4 IMPLANT
TRAY DSU PREP LF (CUSTOM PROCEDURE TRAY) ×2 IMPLANT
TUBE CONNECTING 20X1/4 (TUBING) IMPLANT
TUBING INFILTRATION IT-10001 (TUBING) ×2 IMPLANT
TUBING SET GRADUATE ASPIR 12FT (MISCELLANEOUS) ×2 IMPLANT
UNDERPAD 30X30 (UNDERPADS AND DIAPERS) ×4 IMPLANT
YANKAUER SUCT BULB TIP NO VENT (SUCTIONS) IMPLANT

## 2015-03-07 NOTE — Interval H&P Note (Signed)
History and Physical Interval Note:  0/06/9322 5:57 AM  [name]  has presented today for surgery, with the diagnosis of HISTORY OF BREAST CANCER/ACQUIRED ABSENT BREAST  The various methods of treatment have been discussed with the patient and family. After consideration of risks, benefits and other options for treatment, the patient has consented to  Procedure(s): LIPOFILLING TO LEFT BREAST (Left) LEFT NIPPLE [place] FLAP/FULL THICKNESS SKIN GRAFT FROM GROIN (Left) as a surgical intervention .  The patient's history has been reviewed, patient examined, no change in status, stable for surgery.  I have reviewed the patient's chart and labs.  Questions were answered to the patient's satisfaction.     Kelsey Mclaughlin

## 2015-03-07 NOTE — Discharge Instructions (Signed)

## 2015-03-07 NOTE — Op Note (Addendum)
Operative Note   DATE OF OPERATION: 6.24.2016  LOCATION: [facility] - outpatient  SURGICAL DIVISION: Plastic Surgery  PREOPERATIVE DIAGNOSES:  1. History left breast cancer 2. Acquired absence left breast 3. History therapeutic radiation left chest  POSTOPERATIVE DIAGNOSES:  same  PROCEDURE:  1. Lipofilling to left reconstructed brest 2. Left nipple areolar reconstruction with local flaps, full thickness skin graft from left groin. 3. Scar revision left back 1 cm  SURGEON: Irene Limbo MD MBA  ASSISTANT: none  ANESTHESIA:  General.   EBL: 75 ml  COMPLICATIONS: None immediate.   INDICATIONS FOR PROCEDURE:  The patient, Kelsey Mclaughlin, is a 54 y.o. female born on Mar 05, 1961, is here for left nipple areolar reconstruction and additional lipofilling following latissimus flap and implant based reconstruction.   FINDINGS: 140 ml fat infiltrated left reconstructed breast  DESCRIPTION OF PROCEDURE:  The patient was marked with the patient in the preoperative area in standing position to mark areas over bilateral flanks for fat harvest, chest midline, sternal notch, breast meridians. The location for left nipple marked symmetric with right breast.  The patient was taken to the operating room. SCDs were placed and IV antibiotics were given. The patient's operative site was prepped and draped in a sterile fashion. A time out was performed and all information was confirmed to be correct. Tumescent solution infiltrated over bilateral flanks through stab incisions in preexisting scars. Total of 225 ml fluid infiltrated.    A modified star flap, based laterally, was designed over latissumus flap skin paddle. Cookie cutter used to mark area for areola symmetric with right breast. The flaps were sharply incised and elevated in subcutaneous place. The lateral flaps were approximated in midline with 5-0 vicryl in dermis. The superior flap also inset and skin brought together with 5-0 plain  gut. The remainder of marked area for areola was deepithelialized. Full thickness skin graft harvested from left groin and donor site closed with 3-0 vicryl in dermis and 4-0 monocryl subcuticular skin closure. Tissue adhesive applied. The skin was prepared to remove all subcutaneous fat and hair follicles. The graft was inset over left chest with 5-0 plain gut. Bolster dressing of adaptic and sterile sponge secured with 4-0 silk.   Power assisted liposuction completed over bilateral flanks to endpoint of symmetric contour and soft tissue thickness. Fat prepared for infiltration with washing and gravity. Stab incisions abdomen approximated with interrupted 4-0 monocryl. Using blunt canula, prepared fat placed subcutaneously over superior pole, lateral chest, and within mastectomy flaps.  Sharp excision of most anterior extent of latissimus donor site scar excised for dog ear. Layered closure completed with 4-0 vicryl in dermis and 4-0 monocryl subcuticular.   Tissue glue applied to left groin, left back and breast stab incision. Dry dressing, and abdominal binders placed.  The patient was allowed to wake from anesthesia, extubated and taken to the recovery room in satisfactory condition.   SPECIMENS: none  DRAINS: none  Irene Limbo, MD [facility]astic & Reconstructive Surgery [phone]

## 2015-03-07 NOTE — Anesthesia Postprocedure Evaluation (Signed)
  Anesthesia Post-op Note  Patient: Kelsey Mclaughlin  Procedure(s) Performed: Procedure(s) (LRB): LIPOFILLING TO LEFT BREAST (Left) LEFT NIPPLE AEROLA CREATION WITH LOCAL FLAP/FULL THICKNESS SKIN GRAFT FROM GROIN (Left)  Patient Location: PACU  Anesthesia Type: General  Level of Consciousness: awake and alert   Airway and Oxygen Therapy: Patient Spontanous Breathing  Post-op Pain: mild  Post-op Assessment: Post-op Vital signs reviewed, Patient's Cardiovascular Status Stable, Respiratory Function Stable, Patent Airway and No signs of Nausea or vomiting  Last Vitals:  Filed Vitals:   03/07/15 0951  BP:   Pulse: 75  Temp:   Resp: 18    Post-op Vital Signs: stable   Complications: No apparent anesthesia complications

## 2015-03-07 NOTE — Anesthesia Preprocedure Evaluation (Signed)
Anesthesia Evaluation  Patient identified by MRN, date of birth, ID band Patient awake    Reviewed: Allergy & Precautions, NPO status , Patient's Chart, lab work & pertinent test results  Airway Mallampati: II  TM Distance: >3 FB Neck ROM: Full    Dental no notable dental hx.    Pulmonary neg pulmonary ROS,  breath sounds clear to auscultation  Pulmonary exam normal       Cardiovascular negative cardio ROS Normal cardiovascular examRhythm:Regular Rate:Normal     Neuro/Psych CVA negative psych ROS   GI/Hepatic negative GI ROS, Neg liver ROS,   Endo/Other  negative endocrine ROS  Renal/GU negative Renal ROS  negative genitourinary   Musculoskeletal negative musculoskeletal ROS (+)   Abdominal   Peds negative pediatric ROS (+)  Hematology negative hematology ROS (+)   Anesthesia Other Findings   Reproductive/Obstetrics negative OB ROS                             Anesthesia Physical Anesthesia Plan  ASA: II  Anesthesia Plan: General   Post-op Pain Management:    Induction: Intravenous  Airway Management Planned: LMA  Additional Equipment:   Intra-op Plan:   Post-operative Plan: Extubation in OR  Informed Consent: I have reviewed the patients History and Physical, chart, labs and discussed the procedure including the risks, benefits and alternatives for the proposed anesthesia with the patient or authorized representative who has indicated his/her understanding and acceptance.   Dental advisory given  Plan Discussed with: CRNA and Surgeon  Anesthesia Plan Comments:         Anesthesia Quick Evaluation

## 2015-03-07 NOTE — Anesthesia Procedure Notes (Signed)
Procedure Name: LMA Insertion Date/Time: 03/07/2015 7:36 AM Performed by: Melynda Ripple D Pre-anesthesia Checklist: Patient identified, Emergency Drugs available, Suction available and Patient being monitored Patient Re-evaluated:Patient Re-evaluated prior to inductionOxygen Delivery Method: Circle System Utilized Preoxygenation: Pre-oxygenation with 100% oxygen Intubation Type: IV induction Ventilation: Mask ventilation without difficulty LMA: LMA inserted LMA Size: 4.0 Number of attempts: 1 Airway Equipment and Method: Bite block Placement Confirmation: positive ETCO2 Tube secured with: Tape Dental Injury: Teeth and Oropharynx as per pre-operative assessment

## 2015-03-07 NOTE — Transfer of Care (Signed)
Immediate Anesthesia Transfer of Care Note  Patient: Kelsey Mclaughlin  Procedure(s) Performed: Procedure(s): LIPOFILLING TO LEFT BREAST (Left) LEFT NIPPLE AEROLA CREATION WITH LOCAL FLAP/FULL THICKNESS SKIN GRAFT FROM GROIN (Left)  Patient Location: PACU  Anesthesia Type:General  Level of Consciousness: awake, alert  and oriented  Airway & Oxygen Therapy: Patient Spontanous Breathing and Patient connected to face mask oxygen  Post-op Assessment: Report given to RN and Post -op Vital signs reviewed and stable  Post vital signs: Reviewed and stable  Last Vitals:  Filed Vitals:   03/07/15 0951  BP:   Pulse: 75  Temp:   Resp: 18    Complications: No apparent anesthesia complications

## 2015-03-10 ENCOUNTER — Encounter (HOSPITAL_BASED_OUTPATIENT_CLINIC_OR_DEPARTMENT_OTHER): Payer: Self-pay | Admitting: Plastic Surgery

## 2015-06-02 ENCOUNTER — Other Ambulatory Visit: Payer: Self-pay

## 2015-06-02 DIAGNOSIS — C50412 Malignant neoplasm of upper-outer quadrant of left female breast: Secondary | ICD-10-CM

## 2015-06-03 ENCOUNTER — Other Ambulatory Visit: Payer: Federal, State, Local not specified - PPO

## 2015-06-03 ENCOUNTER — Ambulatory Visit: Payer: Federal, State, Local not specified - PPO | Admitting: Oncology

## 2015-07-17 ENCOUNTER — Other Ambulatory Visit: Payer: Self-pay | Admitting: Oncology

## 2015-08-29 ENCOUNTER — Telehealth: Payer: Self-pay | Admitting: Oncology

## 2015-08-29 NOTE — Telephone Encounter (Signed)
RETURNED CALL TO PATIENT SPOUSE ANITA TO CONFIRM SHE DID HAVE APPOINTMENT FOR Monday. ANITA LEFT MESSAGE FOR ME 12/15 RE APPOINTMENT AND NOT RECEIVING A CALL BACK. CONFIRMED 12/19 APPOINTMENTS.

## 2015-08-29 NOTE — Telephone Encounter (Signed)
Called to reschedule missed appointments

## 2015-08-31 NOTE — Progress Notes (Signed)
This encounter was created in error - please disregard.

## 2015-09-01 ENCOUNTER — Ambulatory Visit (HOSPITAL_BASED_OUTPATIENT_CLINIC_OR_DEPARTMENT_OTHER): Payer: Federal, State, Local not specified - PPO | Admitting: Oncology

## 2015-09-01 ENCOUNTER — Telehealth: Payer: Self-pay | Admitting: Oncology

## 2015-09-01 ENCOUNTER — Other Ambulatory Visit (HOSPITAL_BASED_OUTPATIENT_CLINIC_OR_DEPARTMENT_OTHER): Payer: Federal, State, Local not specified - PPO

## 2015-09-01 VITALS — BP 140/61 | HR 57 | Temp 98.3°F | Resp 18 | Ht 67.0 in | Wt 178.3 lb

## 2015-09-01 DIAGNOSIS — C50412 Malignant neoplasm of upper-outer quadrant of left female breast: Secondary | ICD-10-CM | POA: Diagnosis not present

## 2015-09-01 DIAGNOSIS — Z170 Estrogen receptor positive status [ER+]: Secondary | ICD-10-CM

## 2015-09-01 DIAGNOSIS — M8580 Other specified disorders of bone density and structure, unspecified site: Secondary | ICD-10-CM | POA: Diagnosis not present

## 2015-09-01 LAB — CBC WITH DIFFERENTIAL/PLATELET
BASO%: 0.4 % (ref 0.0–2.0)
Basophils Absolute: 0 10*3/uL (ref 0.0–0.1)
EOS ABS: 0.1 10*3/uL (ref 0.0–0.5)
EOS%: 0.8 % (ref 0.0–7.0)
HCT: 41 % (ref 34.8–46.6)
HGB: 13.4 g/dL (ref 11.6–15.9)
LYMPH%: 21.2 % (ref 14.0–49.7)
MCH: 30.6 pg (ref 25.1–34.0)
MCHC: 32.8 g/dL (ref 31.5–36.0)
MCV: 93.3 fL (ref 79.5–101.0)
MONO#: 0.6 10*3/uL (ref 0.1–0.9)
MONO%: 8.5 % (ref 0.0–14.0)
NEUT#: 4.8 10*3/uL (ref 1.5–6.5)
NEUT%: 69.1 % (ref 38.4–76.8)
Platelets: 254 10*3/uL (ref 145–400)
RBC: 4.39 10*6/uL (ref 3.70–5.45)
RDW: 13.2 % (ref 11.2–14.5)
WBC: 6.9 10*3/uL (ref 3.9–10.3)
lymph#: 1.5 10*3/uL (ref 0.9–3.3)

## 2015-09-01 LAB — COMPREHENSIVE METABOLIC PANEL
ALBUMIN: 4.1 g/dL (ref 3.5–5.0)
ALT: 46 U/L (ref 0–55)
AST: 29 U/L (ref 5–34)
Alkaline Phosphatase: 87 U/L (ref 40–150)
Anion Gap: 9 mEq/L (ref 3–11)
BUN: 12.7 mg/dL (ref 7.0–26.0)
CO2: 27 mEq/L (ref 22–29)
CREATININE: 0.7 mg/dL (ref 0.6–1.1)
Calcium: 9.8 mg/dL (ref 8.4–10.4)
Chloride: 104 mEq/L (ref 98–109)
EGFR: >90 mL/min/{1.73_m2} (ref >90)
GLUCOSE: 90 mg/dL (ref 70–140)
Potassium: 3.8 mEq/L (ref 3.5–5.1)
SODIUM: 140 meq/L (ref 136–145)
Total Bilirubin: 0.35 mg/dL (ref 0.20–1.20)
Total Protein: 7.1 g/dL (ref 6.4–8.3)

## 2015-09-01 MED ORDER — ANASTROZOLE 1 MG PO TABS: ORAL_TABLET | ORAL | Status: DC

## 2015-09-01 MED ORDER — ACYCLOVIR 400 MG PO TABS: 400.0000 mg | ORAL_TABLET | Freq: Every day | ORAL | Status: DC

## 2015-09-01 NOTE — Progress Notes (Signed)
[place]  [phone] [phone]     ID: Kelsey Mclaughlin OB: 1961/06/20  MR#: [id number]  ITG#:[id number]  PCP: [name], MD GYN:  Jac Canavan MD SU: Donnie Mesa MD OTHER MD: Irene Limbo MD, Gwendolyn Grant MD  CHIEF COMPLAINT: Breast cancer, recurrent, estrogen receptor positive  CURRENT TREATMENT:  anastrozole  BREAST CANCER HISTORY: From doctor Kelsey Mclaughlin's intake note 83/05/4075:  "Kelsey Mclaughlin is a 54 y.o. female. In 1998 at the age of 54 was diagnosed with left breast cancer in [place]. This was an invasive carcinoma grade 2 stage II. At that time she underwent a lumpectomy with excellent lymph node dissection. She received 6 months of chemotherapy. [name] She also had radiation therapy to the left breast. 2011 patient noticed a lump in the outside of her left breast. She had ultrasound workup performed and was told it was negative no biopsies were performed. General 2014 after patient moved to the [place] she had a mammogram performed this revealed a possible mass in the outer quadrant of the left breast. Ultrasound showed it to be 1.7 cm. MRI of the breasts performed on January 30 revealed the mass to be 1.5 x 2.2 x 1.5 cm. Because of this she also had a biopsy performed on 10/04/2013. The biopsy revealed [[id number]) an invasive ductal carcinoma with ductal carcinoma in situ grade 2. Prognostic panel was positive for estrogen receptor 100% megestrol receptor +33% proliferation marker Ki-67 15% and the tumor was HER-2/neu positive" [with a signals ratio of 3.1 and number per cell 6.3]  The patient's subsequent history is as detailed below  INTERVAL HISTORY: Kelsey Mclaughlin returns today for follow up of her estrogen receptor positive breast cancer, accompanied by her spouse Kelsey Mclaughlin.  Kelsey Mclaughlin continues on anastrozole, which she is tolerating well. Hot flashes  ("hitze")  Are not a problem and vaginal dryness is not an issue. She has not developed the arthralgias or myalgias that patients can have with this medication. She obtains it at a good price   REVIEW OF SYSTEMS: [place] has some tingling in her fingertips and toe tips. This comes and goes. It is worse with heat in fact its more like a heat sensitivity then a more classical neuropathy. She is not entirely satisfied with her reconstruction but she is satisfied enough that she is not going to have any further surgery. 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 A detailed review of systems today was otherwise noncontributory  PAST MEDICAL HISTORY: Past Medical History  Diagnosis Date  . Cancer 1998, 2015    breast  . Bronchitis     completed antibiotics 10/21/13/ states resolved  . Stroke 2004    TIA-  no problems since  . Lymph edema     lt arm    PAST SURGICAL HISTORY: Past Surgical History  Procedure Laterality Date  . Breast surgery  1998    lumpectomy on left and removed lymphnodes in [place]  . Portacath placement Right 10/25/2013    Procedure: INSERTION PORT-A-CATH;  Surgeon: Imogene Burn. Tsuei, MD;  Location: [facility];  Service: General;  Laterality: Right;  . Simple mastectomy with axillary sentinel node biopsy Left 04/12/2014    Procedure: MASTECTOMY;  Surgeon: Imogene Burn. Georgette Dover, MD;  Location: [facility];  Service: General;  Laterality: Left;  . Latissimus flap  to breast Left 04/12/2014    Procedure: LEFT LATISSIMUS DORSI FLAP FOR LEFT BREAST RECONSTRUCTION AND PLACEMENT OF TISSUE EXPANDER;  Surgeon: Irene Limbo, MD;  Location: [facility];  Service: Plastics;  Laterality: Left;  . Tissue expander placement Left 04/12/2014    Procedure: TISSUE EXPANDER;  Surgeon: Irene Limbo, MD;  Location: [facility];  Service: Plastics;  Laterality: Left;  . Tissue expander placement Left 05/22/2014    Procedure: PLACEMENT OF  TISSUE EXPANDER/I&D WASHOUT SEROMA;  Surgeon: Irene Limbo, MD;  Location: [facility];  Service: Plastics;  Laterality: Left;  . Breast implant exchange Left 09/03/2014    Procedure: LEFT REMOVAL TISSUE EXPANDERS WITH PLACEMENT OF SILICONE IMPLANT ;  Surgeon: Irene Limbo, MD;  Location: [facility];  Service: Plastics;  Laterality: Left;  . Liposuction with lipofilling Left 09/03/2014    Procedure:  LIPOFILLING TO LEFT CHEST  ;  Surgeon: Irene Limbo, MD;  Location: [facility];  Service: Plastics;  Laterality: Left;  Marland Kitchen Mastopexy Right 09/03/2014    Procedure: RIGHT BREAST MASTOPEXY ;  Surgeon: Irene Limbo, MD;  Location: [facility];  Service: Plastics;  Laterality: Right;  . Port-a-cath removal Right 12/24/2014    Procedure: REMOVAL PORT-A-CATH;  Surgeon: Donnie Mesa, MD;  Location: [facility];  Service: General;  Laterality: Right;  . Abdominoplasty/panniculectomy    . Liposuction with lipofilling Left 03/07/2015    Procedure: LIPOFILLING TO LEFT BREAST;  Surgeon: Irene Limbo, MD;  Location: [facility];  Service: Plastics;  Laterality: Left;  . Breast reconstruction Left 03/07/2015    Procedure: LEFT NIPPLE AEROLA CREATION WITH LOCAL FLAP/FULL THICKNESS SKIN GRAFT FROM GROIN;  Surgeon: Irene Limbo, MD;  Location: [facility];  Service: Plastics;  Laterality: Left;    FAMILY HISTORY Family History  Problem Relation Age of Onset  . Breast cancer Mother 76  . Heart disease Father   . Heart attack Father   . Breast cancer Maternal Grandmother 23  . Brain cancer Maternal Uncle 74    benign brain tumor  . Heart attack Paternal Aunt     GYNECOLOGIC HISTORY:   menarche age 7, first live birth age 60. The patient is GX P2. She went through the change of life approximately 2011.   SOCIAL HISTORY:  The patient worked previously as a Marine scientist. She has 2 children both of whom live in [place], works for the [employer], and [employer], a Librarian, academic. They are in their early 63s. There are no grandchildren. The patient married Kelsey Mclaughlin (both women are originally from [place]) in [place]. [place], 40+ chickens and 2 horses. They're not church attenders    ADVANCED DIRECTIVES: in place   HEALTH MAINTENANCE: Social History  Substance Use Topics  . Smoking status: Never Smoker   . Smokeless tobacco: Never Used  . Alcohol Use: Yes     Comment: 2 drinks/day     Colonoscopy:  PAP:  Bone density:  Lipid panel:  Allergies  Allergen Reactions  . Adhesive [Tape] Rash    Band-aids    Current Outpatient Prescriptions  Medication Sig Dispense Refill  . anastrozole (ARIMIDEX) 1 MG tablet TAKE 1 TABLET (1 MG TOTAL) BY MOUTH DAILY. 90 tablet 3  . HYDROcodone-acetaminophen (NORCO) 10-325 MG per tablet Take 1 tablet by mouth every 4 (four) hours as needed. 40 tablet 0   No current facility-administered medications for this visit.  OBJECTIVE: middle-aged white woman  In no acute distress Filed Vitals:   09/01/15 1410  BP: 140/61  Pulse: 57  Temp: 98.3 F (36.8 C)  Resp: 18     Body mass index is 27.92 kg/(m^2).      ECOG FS:0 - Asymptomatic  Sclerae unicteric, pupils round and equal Oropharynx clear and moist-- no thrush or other lesions No cervical or supraclavicular adenopathy Lungs no rales or rhonchi Heart regular rate and rhythm Abd soft, nontender, positive bowel sounds MSK no focal spinal tenderness, no upper extremity lymphedema Neuro: nonfocal, well oriented, appropriate affect Breasts: the right breast is status post reduction mammoplasty. It is otherwise unremarkable. The left breast is status post mastectomy with latissimus flap and implant reconstruction. It is approximately 7% smaller than the right breast and it hangs a little higher. It is a little tighter as well. There are some  telangiectasias , but no evidence of disease recurrence. The left axilla is benign.    LAB RESULTS:  CMP     Component Value Date/Time   NA 141 12/06/2014 0929   NA 144 07/25/2014 1204   K 4.1 12/06/2014 0929   K 4.1 07/25/2014 1204   CL 104 07/25/2014 1204   CO2 26 12/06/2014 0929   CO2 27 07/25/2014 1204   GLUCOSE 103 12/06/2014 0929   GLUCOSE 103* 07/25/2014 1204   BUN 9.9 12/06/2014 0929   BUN 11 07/25/2014 1204   CREATININE 0.7 12/06/2014 0929   CREATININE 0.54 07/25/2014 1204   CALCIUM 9.5 12/06/2014 0929   CALCIUM 9.9 07/25/2014 1204   PROT 6.9 12/06/2014 0929   PROT 7.5 07/25/2014 1204   ALBUMIN 3.8 12/06/2014 0929   ALBUMIN 3.9 07/25/2014 1204   AST 35* 12/06/2014 0929   AST 35 07/25/2014 1204   ALT 64* 12/06/2014 0929   ALT 49* 07/25/2014 1204   ALKPHOS 106 12/06/2014 0929   ALKPHOS 104 07/25/2014 1204   BILITOT 0.37 12/06/2014 0929   BILITOT <0.2* 07/25/2014 1204   GFRNONAA >90 04/13/2014 0430   GFRAA >90 04/13/2014 0430    I No results found for: SPEP  Lab Results  Component Value Date   WBC 6.9 09/01/2015   NEUTROABS 4.8 09/01/2015   HGB 13.4 09/01/2015   HCT 41.0 09/01/2015   MCV 93.3 09/01/2015   PLT 254 09/01/2015      Chemistry      Component Value Date/Time   NA 141 12/06/2014 0929   NA 144 07/25/2014 1204   K 4.1 12/06/2014 0929   K 4.1 07/25/2014 1204   CL 104 07/25/2014 1204   CO2 26 12/06/2014 0929   CO2 27 07/25/2014 1204   BUN 9.9 12/06/2014 0929   BUN 11 07/25/2014 1204   CREATININE 0.7 12/06/2014 0929   CREATININE 0.54 07/25/2014 1204      Component Value Date/Time   CALCIUM 9.5 12/06/2014 0929   CALCIUM 9.9 07/25/2014 1204   ALKPHOS 106 12/06/2014 0929   ALKPHOS 104 07/25/2014 1204   AST 35* 12/06/2014 0929   AST 35 07/25/2014 1204   ALT 64* 12/06/2014 0929   ALT 49* 07/25/2014 1204   BILITOT 0.37 12/06/2014 0929   BILITOT <0.2* 07/25/2014 1204       No results found for: LABCA2  No components found for:  LABCA125  No results for input(s): INR in the last 168 hours.  Urinalysis    Component Value Date/Time   LABSPEC 1.005 05/22/2014 1130   PHURINE 6.5 05/22/2014 1130   GLUCOSEU  Negative 05/22/2014 1130   HGBUR Small 05/22/2014 1130   BILIRUBINUR Negative 05/22/2014 1130   KETONESUR Negative 05/22/2014 1130   PROTEINUR < 30 05/22/2014 1130   UROBILINOGEN 0.2 05/22/2014 1130   NITRITE Negative 05/22/2014 1130   LEUKOCYTESUR Negative 05/22/2014 1130    STUDIES: Most recent  Right mammogram 12/17/2014 was unremarkable  Most recent bone density scan on 07/29/14 showed a t-score of -1.2 (osteopenia).  ASSESSMENT: 54 y.o. BRCA negative [place] woman, [place] speaker   (1) status post left lumpectomy and axillary lymph node dissection in 1998 in [place] for a stage II invasive ductal carcinoma, grade 2, treated with adjuvant chemotherapy (specific drugs not clear) and adjuvant radiation.  (2) status post left breast upper outer quadrant biopsy 10/04/2013 for a clinically mT2 N0, stage IIA invasive ductal carcinoma, grade 2, estrogen receptor 100% positive, progesterone receptor 33% positive, with an MIB-1 of 15%, and HER-2 amplified; staging studies showed no evidence of metastatic disease  (3) started on neoadjuvant carboplatin/ docetaxel/ trastuzumab/ pertuzumab 11/12/2013  (a) completed 4 cycles as of 05/06/201, after which docetaxel was discontinued because of neuropathy symptoms   (b) gemcitabine was given day 1 and day 8 with carboplatin day 1 in the final 2 cycles, completed 03/04/2014   (c)  Completed a year of trastuzumab March 2016--  Final echo 10/21/2014 shows a normal EF  (4) left mastectomy  04/12/2014 showed residual mpT1c pNX invasive ductal carcinoma, grade 2, estrogen and progesterone receptor positive, HER-2 negative, with negative margins  (5) anastrozole started 07/14/2014  (a) bone density 07/29/2014 shows T score - 1.2 (mild osteopenia)  (6) genetic  testing February 2015 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   PLAN: Kelsey Mclaughlin is finally reentering her  "new normalcy ". I think the letrozole that was recently prescribed for her is helping with the posttraumatic stress symptoms. At any rate she has adjusted well to her new post breast cancer situation. She is tolerating the anastrozole with minimal side effects. The plan at this point is for 5 years of treatment after which she will "graduate " from follow-up here.   she requested a prescription for acyclovir which I was glad to fill out for her. She just had a Pap smear under her gynecologist but I do not yet have those results. Her next mammogram will be in April. I am going to see her in June and if she sees her gynecologist in November or December of that we'll probably be sufficient follow-up for routine issues. She does need to find herself a primary care physician and Kelsey Mclaughlin is considering having her see her own primary care physician, Dr. Rex Kras.   Janyia knows to call for any problems that may develop before her next visit here. [name], MD   09/01/2015 2:23 PM

## 2015-09-01 NOTE — Telephone Encounter (Signed)
Appointments made and avs printed for patient °

## 2015-09-15 ENCOUNTER — Other Ambulatory Visit: Payer: Self-pay | Admitting: Oncology

## 2015-09-15 DIAGNOSIS — C50412 Malignant neoplasm of upper-outer quadrant of left female breast: Secondary | ICD-10-CM

## 2015-09-16 ENCOUNTER — Telehealth: Payer: Self-pay | Admitting: Oncology

## 2015-09-16 NOTE — Telephone Encounter (Signed)
No voicemail set up,mailed a letter and mammo calendar

## 2015-12-10 ENCOUNTER — Other Ambulatory Visit: Payer: Self-pay | Admitting: Oncology

## 2015-12-10 ENCOUNTER — Ambulatory Visit
Admission: RE | Admit: 2015-12-10 | Discharge: 2015-12-10 | Disposition: A | Discharge status: Home / Self Care | Payer: Federal, State, Local not specified - PPO | Source: Ambulatory Visit | Attending: Oncology | Admitting: Oncology

## 2015-12-10 DIAGNOSIS — C50412 Malignant neoplasm of upper-outer quadrant of left female breast: Secondary | ICD-10-CM

## 2015-12-15 DIAGNOSIS — K080 Exfoliation of teeth due to systemic causes: Secondary | ICD-10-CM | POA: Diagnosis not present

## 2016-01-26 DIAGNOSIS — F3342 Major depressive disorder, recurrent, in full remission: Secondary | ICD-10-CM | POA: Diagnosis not present

## 2016-02-23 ENCOUNTER — Other Ambulatory Visit: Payer: Self-pay | Admitting: *Deleted

## 2016-02-23 DIAGNOSIS — C50412 Malignant neoplasm of upper-outer quadrant of left female breast: Secondary | ICD-10-CM

## 2016-02-24 ENCOUNTER — Ambulatory Visit (HOSPITAL_BASED_OUTPATIENT_CLINIC_OR_DEPARTMENT_OTHER): Payer: Federal, State, Local not specified - PPO | Admitting: Oncology

## 2016-02-24 ENCOUNTER — Other Ambulatory Visit (HOSPITAL_BASED_OUTPATIENT_CLINIC_OR_DEPARTMENT_OTHER): Payer: Federal, State, Local not specified - PPO

## 2016-02-24 ENCOUNTER — Telehealth: Payer: Self-pay | Admitting: Oncology

## 2016-02-24 ENCOUNTER — Other Ambulatory Visit: Payer: Federal, State, Local not specified - PPO | Admitting: Oncology

## 2016-02-24 VITALS — BP 126/79 | HR 62 | Temp 98.4°F | Resp 18 | Ht 67.0 in | Wt 178.4 lb

## 2016-02-24 DIAGNOSIS — C50912 Malignant neoplasm of unspecified site of left female breast: Secondary | ICD-10-CM

## 2016-02-24 DIAGNOSIS — Z170 Estrogen receptor positive status [ER+]: Secondary | ICD-10-CM | POA: Diagnosis not present

## 2016-02-24 DIAGNOSIS — C50412 Malignant neoplasm of upper-outer quadrant of left female breast: Secondary | ICD-10-CM | POA: Diagnosis not present

## 2016-02-24 DIAGNOSIS — M8580 Other specified disorders of bone density and structure, unspecified site: Secondary | ICD-10-CM | POA: Diagnosis not present

## 2016-02-24 LAB — CBC WITH DIFFERENTIAL/PLATELET
BASO%: 0.6 % (ref 0.0–2.0)
Basophils Absolute: 0 10*3/uL (ref 0.0–0.1)
EOS%: 2 % (ref 0.0–7.0)
Eosinophils Absolute: 0.1 10*3/uL (ref 0.0–0.5)
HEMATOCRIT: 39.6 % (ref 34.8–46.6)
HGB: 13.1 g/dL (ref 11.6–15.9)
LYMPH#: 1.4 10*3/uL (ref 0.9–3.3)
LYMPH%: 19.9 % (ref 14.0–49.7)
MCH: 30.8 pg (ref 25.1–34.0)
MCHC: 33.1 g/dL (ref 31.5–36.0)
MCV: 93 fL (ref 79.5–101.0)
MONO#: 0.6 10*3/uL (ref 0.1–0.9)
MONO%: 8.7 % (ref 0.0–14.0)
NEUT#: 5 10*3/uL (ref 1.5–6.5)
NEUT%: 68.8 % (ref 38.4–76.8)
Platelets: 265 10*3/uL (ref 145–400)
RBC: 4.25 10*6/uL (ref 3.70–5.45)
RDW: 13.2 % (ref 11.2–14.5)
WBC: 7.2 10*3/uL (ref 3.9–10.3)

## 2016-02-24 LAB — COMPREHENSIVE METABOLIC PANEL
ALBUMIN: 3.9 g/dL (ref 3.5–5.0)
ALT: 58 U/L — AB (ref 0–55)
AST: 33 U/L (ref 5–34)
Alkaline Phosphatase: 92 U/L (ref 40–150)
Anion Gap: 9 mEq/L (ref 3–11)
BUN: 9.5 mg/dL (ref 7.0–26.0)
CO2: 29 mEq/L (ref 22–29)
CREATININE: 0.7 mg/dL (ref 0.6–1.1)
Calcium: 9.5 mg/dL (ref 8.4–10.4)
Chloride: 106 mEq/L (ref 98–109)
EGFR: >90 mL/min/{1.73_m2} (ref >90)
Glucose: 83 mg/dl (ref 70–140)
Potassium: 3.9 mEq/L (ref 3.5–5.1)
Sodium: 144 mEq/L (ref 136–145)
TOTAL PROTEIN: 7 g/dL (ref 6.4–8.3)
Total Bilirubin: <0.3 mg/dL (ref 0.20–1.20)

## 2016-02-24 MED ORDER — ANASTROZOLE 1 MG PO TABS: ORAL_TABLET | ORAL | Status: DC

## 2016-02-24 NOTE — Telephone Encounter (Signed)
appt made and avs printed °

## 2016-02-24 NOTE — Progress Notes (Signed)
[place]  [phone] [phone]     ID: Kelsey Mclaughlin OB: 03-08-1961  MR#: [id number]  EUM#:[id number]  PCP: Kelsey Cruel, MD GYN:  Kelsey Canavan MD SU: Kelsey Mesa MD OTHER MD: Kelsey Limbo MD, Kelsey Grant MD, Kelsey May MD  CHIEF COMPLAINT: Breast cancer, recurrent, estrogen receptor positive  CURRENT TREATMENT:  anastrozole  BREAST CANCER HISTORY: From doctor Kelsey Mclaughlin's intake note 54/00/8676:  "Kelsey Mclaughlin is a 55 y.o. female. In 1998 at the age of 39 was diagnosed with left breast cancer in [place]. This was an invasive carcinoma grade 2 stage II. At that time she underwent a lumpectomy with excellent lymph node dissection. She received 6 months of chemotherapy. [name] She also had radiation therapy to the left breast. 2011 patient noticed a lump in the outside of her left breast. She had ultrasound workup performed and was told it was negative no biopsies were performed. General 2014 after patient moved to the [place] she had a mammogram performed this revealed a possible mass in the outer quadrant of the left breast. Ultrasound showed it to be 1.7 cm. MRI of the breasts performed on January 30 revealed the mass to be 1.5 x 2.2 x 1.5 cm. Because of this she also had a biopsy performed on 10/04/2013. The biopsy revealed [[id number]) an invasive ductal carcinoma with ductal carcinoma in situ grade 2. Prognostic panel was positive for estrogen receptor 100% megestrol receptor +33% proliferation marker Ki-67 15% and the tumor was HER-2/neu positive" [with a signals ratio of 3.1 and number per cell 6.3]  The patient's subsequent history is as detailed below  INTERVAL HISTORY: Kelsey Mclaughlin returns today for follow up of her breast cancer, accompanied by her spouse Kelsey Mclaughlin.  Kelsey Mclaughlin is tolerating anastrozole well, with mild hot flashes, perhaps once or twice a  day. She has no other side effects other than the fact that her hair is thinning a little. This is not particularly noticeable. She obtains a drug at a good price.   REVIEW OF SYSTEMS: Kelsey Mclaughlin does a lot of work in their garden and taking care of her animals including chickens and horses. This is what keeps her busy. She is not however wearing her left upper extremity sleeve and she is not wearing a gloves while she is doing all this work. Her depression is better now off Lexapro and on Pristiq. She recently traveled to [place] where her son married his companion of 8 years, a girl she likes. A detailed review of systems today was otherwise stable.  PAST MEDICAL HISTORY: Past Medical History  Diagnosis Date  . Cancer 1998, 2015    breast  . Bronchitis     completed antibiotics 10/21/13/ states resolved  . Stroke 2004    TIA-  no problems since  . Lymph edema     lt arm    PAST SURGICAL HISTORY: Past Surgical History  Procedure Laterality Date  . Breast surgery  1998    lumpectomy on left and removed lymphnodes in [place]  . Portacath placement Right 10/25/2013    Procedure: INSERTION PORT-A-CATH;  Surgeon: Imogene Burn. Tsuei, MD;  Location: [facility];  Service: General;  Laterality: Right;  . Simple mastectomy with axillary sentinel node biopsy Left 04/12/2014    Procedure: MASTECTOMY;  Surgeon: Imogene Burn. Georgette Dover, MD;  Location: [facility];  Service: General;  Laterality: Left;  . Latissimus flap to breast  Left 04/12/2014    Procedure: LEFT LATISSIMUS DORSI FLAP FOR LEFT BREAST RECONSTRUCTION AND PLACEMENT OF TISSUE EXPANDER;  Surgeon: Kelsey Limbo, MD;  Location: [facility];  Service: Plastics;  Laterality: Left;  . Tissue expander placement Left 04/12/2014    Procedure: TISSUE EXPANDER;  Surgeon: Kelsey Limbo, MD;  Location: [facility];  Service: Plastics;  Laterality: Left;  . Tissue expander placement Left 05/22/2014    Procedure: PLACEMENT OF TISSUE EXPANDER/I&D WASHOUT SEROMA;  Surgeon: Kelsey Limbo, MD;  Location: [facility];  Service: Plastics;  Laterality: Left;  . Breast implant exchange Left 09/03/2014    Procedure: LEFT REMOVAL TISSUE EXPANDERS WITH PLACEMENT OF SILICONE IMPLANT ;  Surgeon: Kelsey Limbo, MD;  Location: [facility];  Service: Plastics;  Laterality: Left;  . Liposuction with lipofilling Left 09/03/2014    Procedure:  LIPOFILLING TO LEFT CHEST  ;  Surgeon: Kelsey Limbo, MD;  Location: [facility];  Service: Plastics;  Laterality: Left;  Marland Kitchen Mastopexy Right 09/03/2014    Procedure: RIGHT BREAST MASTOPEXY ;  Surgeon: Kelsey Limbo, MD;  Location: [facility];  Service: Plastics;  Laterality: Right;  . Port-a-cath removal Right 12/24/2014    Procedure: REMOVAL PORT-A-CATH;  Surgeon: Kelsey Mesa, MD;  Location: [facility];  Service: General;  Laterality: Right;  . Abdominoplasty/panniculectomy    . Liposuction with lipofilling Left 03/07/2015    Procedure: LIPOFILLING TO LEFT BREAST;  Surgeon: Kelsey Limbo, MD;  Location: [facility];  Service: Plastics;  Laterality: Left;  . Breast reconstruction Left 03/07/2015    Procedure: LEFT NIPPLE AEROLA CREATION WITH LOCAL FLAP/FULL THICKNESS SKIN GRAFT FROM GROIN;  Surgeon: Kelsey Limbo, MD;  Location: [facility];  Service: Plastics;  Laterality: Left;    FAMILY HISTORY Family History  Problem Relation Age of Onset  . Breast cancer Mother 8  . Heart disease Father   . Heart attack Father   . Breast cancer Maternal Grandmother 87  . Brain cancer Maternal Uncle 74    benign brain tumor  . Heart attack Paternal Aunt     GYNECOLOGIC HISTORY:   menarche age 58, first live birth age 51. The patient is GX P2. She went through the change of life approximately 2011.   SOCIAL HISTORY:  The patient worked previously as a Marine scientist. She has 2 children both of whom live in [place], works for the [employer], and  [employer], a Librarian, academic. They are in their early 76s. There are no grandchildren. The patient married Kelsey Mcl[place] (both women are originally from [place]) in [place]. [place], 40+ chickens and 2 horses. They're not church attenders    ADVANCED DIRECTIVES: in place   HEALTH MAINTENANCE: Social History  Substance Use Topics  . Smoking status: Never Smoker   . Smokeless tobacco: Never Used  . Alcohol Use: Yes     Comment: 2 drinks/day     Colonoscopy:  PAP: [place] 2017  Bone density:  Lipid panel:  Allergies  Allergen Reactions  . Adhesive [Tape] Rash    Band-aids    Current Outpatient Prescriptions  Medication Sig Dispense Refill  . acyclovir (ZOVIRAX) 400 MG tablet Take 1 tablet (400 mg total) by mouth 5 (five) times daily. 90 tablet 4  . anastrozole (ARIMIDEX) 1 MG tablet TAKE 1 TABLET (1 MG TOTAL) BY MOUTH DAILY. 90 tablet 3  . escitalopram (LEXAPRO) 20 MG tablet Take 20 mg  by mouth daily.     No current facility-administered medications for this visit.    OBJECTIVE: middle-aged white woman who appears well  Filed Vitals:   02/24/16 1456  BP: 126/79  Pulse: 62  Temp: 98.4 F (36.9 C)  Resp: 18     Body mass index is 27.93 kg/(m^2).      ECOG FS:0 - Asymptomatic   Hair loss is not immediately apparent, even to direct inspection. Sclerae unicteric, EOMs intact Oropharynx clear, dentition in good repair No cervical or supraclavicular adenopathy Lungs no rales or rhonchi Heart regular rate and rhythm Abd soft, nontender, positive bowel sounds MSK no focal spinal tenderness, grade 1 left upper extremity lymphedema Neuro: nonfocal, well oriented, appropriate affect Breasts: The right breast is unremarkable aside from the fact that it is status post reduction mammoplasty the left breast is status post mastectomy with latissimus flap and implant reconstruction. There is no evidence of local recurrence. The left axilla is  benign.  LAB RESULTS:  CMP     Component Value Date/Time   NA 140 09/01/2015 1343   NA 144 07/25/2014 1204   K 3.8 09/01/2015 1343   K 4.1 07/25/2014 1204   CL 104 07/25/2014 1204   CO2 27 09/01/2015 1343   CO2 27 07/25/2014 1204   GLUCOSE 90 09/01/2015 1343   GLUCOSE 103* 07/25/2014 1204   BUN 12.7 09/01/2015 1343   BUN 11 07/25/2014 1204   CREATININE 0.7 09/01/2015 1343   CREATININE 0.54 07/25/2014 1204   CALCIUM 9.8 09/01/2015 1343   CALCIUM 9.9 07/25/2014 1204   PROT 7.1 09/01/2015 1343   PROT 7.5 07/25/2014 1204   ALBUMIN 4.1 09/01/2015 1343   ALBUMIN 3.9 07/25/2014 1204   AST 29 09/01/2015 1343   AST 35 07/25/2014 1204   ALT 46 09/01/2015 1343   ALT 49* 07/25/2014 1204   ALKPHOS 87 09/01/2015 1343   ALKPHOS 104 07/25/2014 1204   BILITOT 0.35 09/01/2015 1343   BILITOT <0.2* 07/25/2014 1204   GFRNONAA >90 04/13/2014 0430   GFRAA >90 04/13/2014 0430    I No results found for: SPEP  Lab Results  Component Value Date   WBC 7.2 02/24/2016   NEUTROABS 5.0 02/24/2016   HGB 13.1 02/24/2016   HCT 39.6 02/24/2016   MCV 93.0 02/24/2016   PLT 265 02/24/2016      Chemistry      Component Value Date/Time   NA 140 09/01/2015 1343   NA 144 07/25/2014 1204   K 3.8 09/01/2015 1343   K 4.1 07/25/2014 1204   CL 104 07/25/2014 1204   CO2 27 09/01/2015 1343   CO2 27 07/25/2014 1204   BUN 12.7 09/01/2015 1343   BUN 11 07/25/2014 1204   CREATININE 0.7 09/01/2015 1343   CREATININE 0.54 07/25/2014 1204      Component Value Date/Time   CALCIUM 9.8 09/01/2015 1343   CALCIUM 9.9 07/25/2014 1204   ALKPHOS 87 09/01/2015 1343   ALKPHOS 104 07/25/2014 1204   AST 29 09/01/2015 1343   AST 35 07/25/2014 1204   ALT 46 09/01/2015 1343   ALT 49* 07/25/2014 1204   BILITOT 0.35 09/01/2015 1343   BILITOT <0.2* 07/25/2014 1204       No results found for: LABCA2  No components found for: LABCA125  No results for input(s): INR in the last 168 hours.  Urinalysis      Component Value Date/Time   LABSPEC 1.005 05/22/2014 1130   PHURINE 6.5 05/22/2014 1130  GLUCOSEU Negative 05/22/2014 1130   HGBUR Small 05/22/2014 1130   BILIRUBINUR Negative 05/22/2014 1130   KETONESUR Negative 05/22/2014 1130   PROTEINUR < 30 05/22/2014 1130   UROBILINOGEN 0.2 05/22/2014 1130   NITRITE Negative 05/22/2014 1130   LEUKOCYTESUR Negative 05/22/2014 1130    STUDIES: CLINICAL DATA: Screening.  EXAM: 2D DIGITAL SCREENING UNILATERAL RIGHT MAMMOGRAM WITH CAD AND ADJUNCT TOMO  COMPARISON: Previous exam(s).  ACR Breast Density Category b: There are scattered areas of fibroglandular density.  FINDINGS: There are no findings suspicious for malignancy. Images were processed with CAD.  IMPRESSION: No mammographic evidence of malignancy. A result letter of this screening mammogram will be mailed directly to the patient.  RECOMMENDATION: Screening mammogram in one year. (Code:[id number]-B-01Y)  BI-RADS CATEGORY 1: Negative.   Electronically Signed  By: Marin Olp M.D.  On: 12/10/2015 16:38  Most recent bone density scan on 07/29/14 showed a t-score of -1.2 (osteopenia).  ASSESSMENT: 55 y.o. BRCA negative [place] woman, [place] speaker   (1) status post left lumpectomy and axillary lymph node dissection in 1998 in [place] for a stage II invasive ductal carcinoma, grade 2, treated with adjuvant chemotherapy (specific drugs not clear) and adjuvant radiation.  (2) status post left breast upper outer quadrant biopsy 10/04/2013 for a clinically mT2 N0, stage IIA invasive ductal carcinoma, grade 2, estrogen receptor 100% positive, progesterone receptor 33% positive, with an MIB-1 of 15%, and HER-2 amplified; staging studies showed no evidence of metastatic disease  (3) started on neoadjuvant carboplatin/ docetaxel/ trastuzumab/ pertuzumab 11/12/2013  (a) completed 4 cycles as of 05/06/201, after which docetaxel was discontinued because of neuropathy  symptoms   (b) gemcitabine was given day 1 and day 8 with carboplatin day 1 in the final 2 cycles, completed 03/04/2014   (c)  completed a year of trastuzumab March 2016--  Final echo 10/21/2014 shows a normal EF  (4) left mastectomy  04/12/2014 showed residual mpT1c pNX invasive ductal carcinoma, grade 2, estrogen and progesterone receptor positive, HER-2 negative, with negative margins  (5) anastrozole started 07/14/2014  (a) bone density 07/29/2014 shows T score - 1.2 (mild osteopenia)  (6) 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   PLAN: Luvada is now 2 years out from definitive surgery for her breast cancer with no evidence of disease recurrence. This is very favorable.  She is tolerating the anastrozole without any unusual side effects other than her hair thinning a little. We discussed stopping the drug for a couple of months to see that metanephrines but she feels that drug is more important than the hair so we are continuing a right superior.  I urged her to always wear her compression sleeve and particularly a glove since she does a lot of yard work and she is at risk of putting her skin in getting [place] left upper extremity cellulitis. If that were to happen she will call us immediately of course.  In my experience after some hair loss things stabilize and sometimes they are recovers completely., Even without stopping that drug.   Kelsey Cruel, MD   02/24/2016 3:17 PM

## 2016-02-27 DIAGNOSIS — L0390 Cellulitis, unspecified: Secondary | ICD-10-CM | POA: Diagnosis not present

## 2016-04-02 DIAGNOSIS — Z23 Encounter for immunization: Secondary | ICD-10-CM | POA: Diagnosis not present

## 2016-04-02 DIAGNOSIS — Z13220 Encounter for screening for lipoid disorders: Secondary | ICD-10-CM | POA: Diagnosis not present

## 2016-04-02 DIAGNOSIS — Z0000 Encounter for general adult medical examination without abnormal findings: Secondary | ICD-10-CM | POA: Diagnosis not present

## 2016-04-05 ENCOUNTER — Inpatient Hospital Stay (HOSPITAL_COMMUNITY)
Admission: EM | Admit: 2016-04-05 | Discharge: 2016-04-08 | DRG: 872 | Disposition: A | Discharge status: Home / Self Care | Payer: Federal, State, Local not specified - PPO | Attending: Internal Medicine | Admitting: Internal Medicine

## 2016-04-05 ENCOUNTER — Emergency Department (HOSPITAL_COMMUNITY): Payer: Federal, State, Local not specified - PPO

## 2016-04-05 ENCOUNTER — Encounter (HOSPITAL_COMMUNITY): Payer: Self-pay

## 2016-04-05 DIAGNOSIS — Z8673 Personal history of transient ischemic attack (TIA), and cerebral infarction without residual deficits: Secondary | ICD-10-CM | POA: Diagnosis not present

## 2016-04-05 DIAGNOSIS — C50412 Malignant neoplasm of upper-outer quadrant of left female breast: Secondary | ICD-10-CM | POA: Diagnosis not present

## 2016-04-05 DIAGNOSIS — I5032 Chronic diastolic (congestive) heart failure: Secondary | ICD-10-CM | POA: Diagnosis not present

## 2016-04-05 DIAGNOSIS — M8580 Other specified disorders of bone density and structure, unspecified site: Secondary | ICD-10-CM | POA: Diagnosis not present

## 2016-04-05 DIAGNOSIS — Z91048 Other nonmedicinal substance allergy status: Secondary | ICD-10-CM

## 2016-04-05 DIAGNOSIS — Z9221 Personal history of antineoplastic chemotherapy: Secondary | ICD-10-CM | POA: Diagnosis not present

## 2016-04-05 DIAGNOSIS — I890 Lymphedema, not elsewhere classified: Secondary | ICD-10-CM | POA: Diagnosis not present

## 2016-04-05 DIAGNOSIS — A419 Sepsis, unspecified organism: Principal | ICD-10-CM | POA: Diagnosis present

## 2016-04-05 DIAGNOSIS — F329 Major depressive disorder, single episode, unspecified: Secondary | ICD-10-CM | POA: Diagnosis not present

## 2016-04-05 DIAGNOSIS — E876 Hypokalemia: Secondary | ICD-10-CM | POA: Diagnosis not present

## 2016-04-05 DIAGNOSIS — Z853 Personal history of malignant neoplasm of breast: Secondary | ICD-10-CM | POA: Diagnosis not present

## 2016-04-05 DIAGNOSIS — Z803 Family history of malignant neoplasm of breast: Secondary | ICD-10-CM

## 2016-04-05 DIAGNOSIS — Z79811 Long term (current) use of aromatase inhibitors: Secondary | ICD-10-CM

## 2016-04-05 DIAGNOSIS — L03114 Cellulitis of left upper limb: Secondary | ICD-10-CM | POA: Diagnosis not present

## 2016-04-05 DIAGNOSIS — Z170 Estrogen receptor positive status [ER+]: Secondary | ICD-10-CM

## 2016-04-05 DIAGNOSIS — F32A Depression, unspecified: Secondary | ICD-10-CM

## 2016-04-05 DIAGNOSIS — M7989 Other specified soft tissue disorders: Secondary | ICD-10-CM | POA: Diagnosis not present

## 2016-04-05 HISTORY — DX: Chronic diastolic (congestive) heart failure: I50.32

## 2016-04-05 LAB — CBC WITH DIFFERENTIAL/PLATELET
BASOS ABS: 0 10*3/uL (ref 0.0–0.1)
BASOS PCT: 0 %
EOS PCT: 0 %
Eosinophils Absolute: 0 10*3/uL (ref 0.0–0.7)
HEMATOCRIT: 41.6 % (ref 36.0–46.0)
Hemoglobin: 13.5 g/dL (ref 12.0–15.0)
Lymphocytes Relative: 6 %
Lymphs Abs: 0.9 10*3/uL (ref 0.7–4.0)
MCH: 31.1 pg (ref 26.0–34.0)
MCHC: 32.5 g/dL (ref 30.0–36.0)
MCV: 95.9 fL (ref 78.0–100.0)
MONO ABS: 1 10*3/uL (ref 0.1–1.0)
MONOS PCT: 7 %
Neutro Abs: 12.2 10*3/uL — ABNORMAL HIGH (ref 1.7–7.7)
Neutrophils Relative %: 87 %
PLATELETS: 287 10*3/uL (ref 150–400)
RBC: 4.34 MIL/uL (ref 3.87–5.11)
RDW: 13 % (ref 11.5–15.5)
WBC: 14 10*3/uL — ABNORMAL HIGH (ref 4.0–10.5)

## 2016-04-05 LAB — URINALYSIS, ROUTINE W REFLEX MICROSCOPIC
BILIRUBIN URINE: NEGATIVE
Glucose, UA: NEGATIVE mg/dL
HGB URINE DIPSTICK: NEGATIVE
KETONES UR: NEGATIVE mg/dL
Leukocytes, UA: NEGATIVE
NITRITE: NEGATIVE
PROTEIN: 100 mg/dL — AB
Specific Gravity, Urine: 1.027 (ref 1.005–1.030)
pH: 7 (ref 5.0–8.0)

## 2016-04-05 LAB — COMPREHENSIVE METABOLIC PANEL
ALK PHOS: 83 U/L (ref 38–126)
ALT: 63 U/L — AB (ref 14–54)
AST: 61 U/L — ABNORMAL HIGH (ref 15–41)
Albumin: 4 g/dL (ref 3.5–5.0)
Anion gap: 13 (ref 5–15)
BILIRUBIN TOTAL: 0.3 mg/dL (ref 0.3–1.2)
BUN: 7 mg/dL (ref 6–20)
CALCIUM: 9.3 mg/dL (ref 8.9–10.3)
CHLORIDE: 100 mmol/L — AB (ref 101–111)
CO2: 24 mmol/L (ref 22–32)
CREATININE: 0.84 mg/dL (ref 0.44–1.00)
Glucose, Bld: 140 mg/dL — ABNORMAL HIGH (ref 65–99)
Potassium: 3.4 mmol/L — ABNORMAL LOW (ref 3.5–5.1)
Sodium: 137 mmol/L (ref 135–145)
TOTAL PROTEIN: 7 g/dL (ref 6.5–8.1)

## 2016-04-05 LAB — URINE MICROSCOPIC-ADD ON

## 2016-04-05 LAB — I-STAT CG4 LACTIC ACID, ED: LACTIC ACID, VENOUS: 3.11 mmol/L — AB (ref 0.5–1.9)

## 2016-04-05 MED ORDER — ACETAMINOPHEN 500 MG PO TABS
500.0000 mg | ORAL_TABLET | Freq: Once | ORAL | Status: AC
  Administered 2016-04-05: 650 mg via ORAL

## 2016-04-05 MED ORDER — ACETAMINOPHEN 325 MG PO TABS
ORAL_TABLET | ORAL | Status: AC
  Administered 2016-04-05: 650 mg via ORAL
  Filled 2016-04-05: qty 2

## 2016-04-05 NOTE — ED Triage Notes (Signed)
Pt states lack of lymph nodes in L arm. Pt complaining of L hand swelling and redness x today at 1200. Pulses present, good cap refill. States normally take an antibiotic for similar issues.

## 2016-04-06 ENCOUNTER — Encounter (HOSPITAL_COMMUNITY): Payer: Self-pay | Admitting: Internal Medicine

## 2016-04-06 ENCOUNTER — Observation Stay (HOSPITAL_COMMUNITY): Payer: Federal, State, Local not specified - PPO

## 2016-04-06 ENCOUNTER — Emergency Department (HOSPITAL_COMMUNITY): Payer: Federal, State, Local not specified - PPO

## 2016-04-06 DIAGNOSIS — L03114 Cellulitis of left upper limb: Secondary | ICD-10-CM | POA: Diagnosis present

## 2016-04-06 DIAGNOSIS — Z803 Family history of malignant neoplasm of breast: Secondary | ICD-10-CM | POA: Diagnosis not present

## 2016-04-06 DIAGNOSIS — Z853 Personal history of malignant neoplasm of breast: Secondary | ICD-10-CM | POA: Diagnosis not present

## 2016-04-06 DIAGNOSIS — Z91048 Other nonmedicinal substance allergy status: Secondary | ICD-10-CM | POA: Diagnosis not present

## 2016-04-06 DIAGNOSIS — Z8673 Personal history of transient ischemic attack (TIA), and cerebral infarction without residual deficits: Secondary | ICD-10-CM | POA: Diagnosis not present

## 2016-04-06 DIAGNOSIS — F329 Major depressive disorder, single episode, unspecified: Secondary | ICD-10-CM | POA: Diagnosis present

## 2016-04-06 DIAGNOSIS — E876 Hypokalemia: Secondary | ICD-10-CM | POA: Diagnosis not present

## 2016-04-06 DIAGNOSIS — I5032 Chronic diastolic (congestive) heart failure: Secondary | ICD-10-CM | POA: Diagnosis not present

## 2016-04-06 DIAGNOSIS — I890 Lymphedema, not elsewhere classified: Secondary | ICD-10-CM | POA: Diagnosis not present

## 2016-04-06 DIAGNOSIS — Z79811 Long term (current) use of aromatase inhibitors: Secondary | ICD-10-CM | POA: Diagnosis not present

## 2016-04-06 DIAGNOSIS — A419 Sepsis, unspecified organism: Secondary | ICD-10-CM | POA: Diagnosis not present

## 2016-04-06 DIAGNOSIS — Z9221 Personal history of antineoplastic chemotherapy: Secondary | ICD-10-CM | POA: Diagnosis not present

## 2016-04-06 DIAGNOSIS — M8580 Other specified disorders of bone density and structure, unspecified site: Secondary | ICD-10-CM | POA: Diagnosis not present

## 2016-04-06 DIAGNOSIS — C50412 Malignant neoplasm of upper-outer quadrant of left female breast: Secondary | ICD-10-CM | POA: Diagnosis not present

## 2016-04-06 LAB — PROTIME-INR
INR: 1.05 (ref 0.00–1.49)
INR: 1.09 (ref 0.00–1.49)
PROTHROMBIN TIME: 14.3 s (ref 11.6–15.2)
Prothrombin Time: 13.9 seconds (ref 11.6–15.2)

## 2016-04-06 LAB — PROCALCITONIN
PROCALCITONIN: 0.19 ng/mL
Procalcitonin: 0.12 ng/mL

## 2016-04-06 LAB — CBC WITH DIFFERENTIAL/PLATELET
BASOS ABS: 0 10*3/uL (ref 0.0–0.1)
BASOS PCT: 0 %
Eosinophils Absolute: 0 10*3/uL (ref 0.0–0.7)
Eosinophils Relative: 0 %
HEMATOCRIT: 34.8 % — AB (ref 36.0–46.0)
HEMOGLOBIN: 11.3 g/dL — AB (ref 12.0–15.0)
LYMPHS PCT: 10 %
Lymphs Abs: 0.9 10*3/uL (ref 0.7–4.0)
MCH: 30.8 pg (ref 26.0–34.0)
MCHC: 32.5 g/dL (ref 30.0–36.0)
MCV: 94.8 fL (ref 78.0–100.0)
MONO ABS: 0.8 10*3/uL (ref 0.1–1.0)
Monocytes Relative: 8 %
NEUTROS ABS: 7.6 10*3/uL (ref 1.7–7.7)
NEUTROS PCT: 82 %
Platelets: 231 10*3/uL (ref 150–400)
RBC: 3.67 MIL/uL — AB (ref 3.87–5.11)
RDW: 13.1 % (ref 11.5–15.5)
WBC: 9.3 10*3/uL (ref 4.0–10.5)

## 2016-04-06 LAB — BASIC METABOLIC PANEL
Anion gap: 8 (ref 5–15)
BUN: 6 mg/dL (ref 6–20)
CHLORIDE: 103 mmol/L (ref 101–111)
CO2: 25 mmol/L (ref 22–32)
CREATININE: 0.69 mg/dL (ref 0.44–1.00)
Calcium: 8.5 mg/dL — ABNORMAL LOW (ref 8.9–10.3)
Glucose, Bld: 118 mg/dL — ABNORMAL HIGH (ref 65–99)
POTASSIUM: 3.5 mmol/L (ref 3.5–5.1)
SODIUM: 136 mmol/L (ref 135–145)

## 2016-04-06 LAB — CBC
HCT: 39.1 % (ref 36.0–46.0)
Hemoglobin: 12.6 g/dL (ref 12.0–15.0)
MCH: 31 pg (ref 26.0–34.0)
MCHC: 32.2 g/dL (ref 30.0–36.0)
MCV: 96.3 fL (ref 78.0–100.0)
PLATELETS: 238 10*3/uL (ref 150–400)
RBC: 4.06 MIL/uL (ref 3.87–5.11)
RDW: 13.1 % (ref 11.5–15.5)
WBC: 11.9 10*3/uL — AB (ref 4.0–10.5)

## 2016-04-06 LAB — APTT
APTT: 32 s (ref 24–37)
aPTT: 24 seconds (ref 24–37)

## 2016-04-06 LAB — HIV ANTIBODY (ROUTINE TESTING W REFLEX): HIV SCREEN 4TH GENERATION: NONREACTIVE

## 2016-04-06 LAB — LACTIC ACID, PLASMA
LACTIC ACID, VENOUS: 3 mmol/L — AB (ref 0.5–1.9)
Lactic Acid, Venous: 4 mmol/L (ref 0.5–1.9)
Lactic Acid, Venous: 2.1 mmol/L (ref 0.5–1.9)

## 2016-04-06 LAB — C-REACTIVE PROTEIN: CRP: 7.4 mg/dL — AB (ref <1.0)

## 2016-04-06 LAB — MRSA PCR SCREENING: MRSA by PCR: NEGATIVE

## 2016-04-06 LAB — SEDIMENTATION RATE: Sed Rate: 9 mm/hr (ref 0–22)

## 2016-04-06 LAB — BRAIN NATRIURETIC PEPTIDE: B NATRIURETIC PEPTIDE 5: 61.6 pg/mL (ref 0.0–100.0)

## 2016-04-06 MED ORDER — VANCOMYCIN HCL IN DEXTROSE 1-5 GM/200ML-% IV SOLN
1000.0000 mg | Freq: Two times a day (BID) | INTRAVENOUS | Status: DC
  Administered 2016-04-06 – 2016-04-08 (×5): 1000 mg via INTRAVENOUS
  Filled 2016-04-06 (×7): qty 200

## 2016-04-06 MED ORDER — ANASTROZOLE 1 MG PO TABS
1.0000 mg | ORAL_TABLET | Freq: Every day | ORAL | Status: DC
  Administered 2016-04-06 – 2016-04-08 (×3): 1 mg via ORAL
  Filled 2016-04-06 (×3): qty 1

## 2016-04-06 MED ORDER — ACETAMINOPHEN 650 MG RE SUPP: 650.0000 mg | Freq: Four times a day (QID) | RECTAL | Status: DC | PRN

## 2016-04-06 MED ORDER — VANCOMYCIN HCL IN DEXTROSE 1-5 GM/200ML-% IV SOLN
1000.0000 mg | Freq: Once | INTRAVENOUS | Status: AC
  Administered 2016-04-06: 1000 mg via INTRAVENOUS
  Filled 2016-04-06: qty 200

## 2016-04-06 MED ORDER — SODIUM CHLORIDE 0.9 % IV BOLUS (SEPSIS)
500.0000 mL | Freq: Once | INTRAVENOUS | Status: AC
  Administered 2016-04-06: 500 mL via INTRAVENOUS

## 2016-04-06 MED ORDER — SODIUM CHLORIDE 0.9 % IV BOLUS (SEPSIS)
1000.0000 mL | Freq: Once | INTRAVENOUS | Status: AC
  Administered 2016-04-06: 1000 mL via INTRAVENOUS

## 2016-04-06 MED ORDER — SODIUM CHLORIDE 0.9 % IV SOLN
INTRAVENOUS | Status: DC
  Administered 2016-04-07: 06:00:00 via INTRAVENOUS

## 2016-04-06 MED ORDER — ONDANSETRON HCL 4 MG/2ML IJ SOLN
4.0000 mg | Freq: Three times a day (TID) | INTRAMUSCULAR | Status: DC | PRN
Start: 2016-04-06 — End: 2016-04-08

## 2016-04-06 MED ORDER — POTASSIUM CHLORIDE 20 MEQ/15ML (10%) PO SOLN
20.0000 meq | Freq: Once | ORAL | Status: AC
  Administered 2016-04-06: 20 meq via ORAL
  Filled 2016-04-06: qty 15

## 2016-04-06 MED ORDER — PIPERACILLIN-TAZOBACTAM 3.375 G IVPB
3.3750 g | Freq: Three times a day (TID) | INTRAVENOUS | Status: DC
  Administered 2016-04-06 – 2016-04-08 (×7): 3.375 g via INTRAVENOUS
  Filled 2016-04-06 (×10): qty 50

## 2016-04-06 MED ORDER — GADOBENATE DIMEGLUMINE 529 MG/ML IV SOLN
15.0000 mL | Freq: Once | INTRAVENOUS | Status: AC | PRN
  Administered 2016-04-06: 15 mL via INTRAVENOUS

## 2016-04-06 MED ORDER — SODIUM CHLORIDE 0.9 % IV SOLN
INTRAVENOUS | Status: DC
  Administered 2016-04-06 – 2016-04-08 (×2): via INTRAVENOUS

## 2016-04-06 MED ORDER — SODIUM CHLORIDE 0.9 % IV BOLUS (SEPSIS): 500.0000 mL | Freq: Once | INTRAVENOUS | Status: DC

## 2016-04-06 MED ORDER — OXYCODONE-ACETAMINOPHEN 5-325 MG PO TABS
2.0000 | ORAL_TABLET | Freq: Four times a day (QID) | ORAL | Status: DC | PRN
  Administered 2016-04-06 – 2016-04-07 (×2): 2 via ORAL
  Filled 2016-04-06 (×2): qty 2

## 2016-04-06 MED ORDER — VANCOMYCIN HCL 10 G IV SOLR
1500.0000 mg | Freq: Once | INTRAVENOUS | Status: DC
  Filled 2016-04-06: qty 1500

## 2016-04-06 MED ORDER — PIPERACILLIN-TAZOBACTAM 3.375 G IVPB 30 MIN
3.3750 g | Freq: Once | INTRAVENOUS | Status: AC
  Administered 2016-04-06: 3.375 g via INTRAVENOUS
  Filled 2016-04-06: qty 50

## 2016-04-06 MED ORDER — DESVENLAFAXINE SUCCINATE ER 100 MG PO TB24
100.0000 mg | ORAL_TABLET | Freq: Every day | ORAL | Status: DC
  Administered 2016-04-06 – 2016-04-08 (×3): 100 mg via ORAL
  Filled 2016-04-06 (×3): qty 1

## 2016-04-06 MED ORDER — SODIUM CHLORIDE 0.9 % IV BOLUS (SEPSIS): 1000.0000 mL | Freq: Once | INTRAVENOUS | Status: DC

## 2016-04-06 MED ORDER — SODIUM CHLORIDE 0.9 % IV BOLUS (SEPSIS)
1000.0000 mL | Freq: Once | INTRAVENOUS | Status: AC
  Administered 2016-04-05: 1000 mL via INTRAVENOUS

## 2016-04-06 MED ORDER — ENOXAPARIN SODIUM 40 MG/0.4ML ~~LOC~~ SOLN
40.0000 mg | Freq: Every day | SUBCUTANEOUS | Status: DC
  Administered 2016-04-06 – 2016-04-08 (×3): 40 mg via SUBCUTANEOUS
  Filled 2016-04-06 (×3): qty 0.4

## 2016-04-06 MED ORDER — ACETAMINOPHEN 325 MG PO TABS: 650.0000 mg | ORAL_TABLET | Freq: Four times a day (QID) | ORAL | Status: DC | PRN

## 2016-04-06 MED ORDER — SODIUM CHLORIDE 0.9% FLUSH: 3.0000 mL | Freq: Two times a day (BID) | INTRAVENOUS | Status: DC

## 2016-04-06 MED ORDER — PIPERACILLIN-TAZOBACTAM 3.375 G IVPB 30 MIN: 3.3750 g | Freq: Once | INTRAVENOUS | Status: DC

## 2016-04-06 NOTE — Progress Notes (Signed)
Patient receive from E.R. With monitor and R.N. A and O. W/ no complaints. Oriented to room and  R.N. Into assess patient

## 2016-04-06 NOTE — Consult Note (Signed)
Reason for Consult: Left hand swelling and cellulitis Referring Physician: Dr. Toma Deiters [name] is an 55 y.o. female.  HPI: 55 year old female with past history of breast cancer 1989 and then had a different area recurrence per patient history and the left breast region 2 years ago. She had lymph node resection in the axilla and has an axillary incision. Since her surgery she's had chronic lymphedema in the left upper extremity and has had some recurrent infections with swelling in the hand requiring antibiotic treatment. Patient presented on 04/06/2016 with pain severe in the hand swelling erythema extending just proximal to the wrist increased temperature 101, increased white count.  Past Medical History:  Diagnosis Date  . Bronchitis    completed antibiotics 10/21/13/ states resolved  . Cancer ([place]) 1998, 2015   breast  . Chronic diastolic (congestive) heart failure ([place])   . Lymph edema    lt arm  . Stroke [facility]) 2004   TIA-  no problems since    Past Surgical History:  Procedure Laterality Date  . ABDOMINOPLASTY/PANNICULECTOMY    . BREAST IMPLANT EXCHANGE Left 09/03/2014   Procedure: LEFT REMOVAL TISSUE EXPANDERS WITH PLACEMENT OF SILICONE IMPLANT ;  Surgeon: Irene Limbo, MD;  Location: [place];  Service: Plastics;  Laterality: Left;  . BREAST RECONSTRUCTION Left 03/07/2015   Procedure: LEFT NIPPLE AEROLA CREATION WITH LOCAL FLAP/FULL THICKNESS SKIN GRAFT FROM GROIN;  Surgeon: Irene Limbo, MD;  Location: [place];  Service: Plastics;  Laterality: Left;  . BREAST SURGERY  1998   lumpectomy on left and removed lymphnodes in [place]  . LATISSIMUS FLAP TO BREAST Left 04/12/2014   Procedure: LEFT LATISSIMUS DORSI FLAP FOR LEFT BREAST RECONSTRUCTION AND PLACEMENT OF TISSUE EXPANDER;  Surgeon: Irene Limbo, MD;  Location: [place];  Service: Plastics;  Laterality: Left;  . LIPOSUCTION WITH LIPOFILLING Left 09/03/2014   Procedure:   LIPOFILLING TO LEFT CHEST  ;  Surgeon: Irene Limbo, MD;  Location: [place];  Service: Plastics;  Laterality: Left;  . LIPOSUCTION WITH LIPOFILLING Left 03/07/2015   Procedure: LIPOFILLING TO LEFT BREAST;  Surgeon: Irene Limbo, MD;  Location: [place];  Service: Plastics;  Laterality: Left;  Marland Kitchen MASTOPEXY Right 09/03/2014   Procedure: RIGHT BREAST MASTOPEXY ;  Surgeon: Irene Limbo, MD;  Location: [place];  Service: Plastics;  Laterality: Right;  . PORT-A-CATH REMOVAL Right 12/24/2014   Procedure: REMOVAL PORT-A-CATH;  Surgeon: Donnie Mesa, MD;  Location: [place];  Service: General;  Laterality: Right;  . PORTACATH PLACEMENT Right 10/25/2013   Procedure: INSERTION PORT-A-CATH;  Surgeon: Imogene Burn. Georgette Dover, MD;  Location: [facility];  Service: General;  Laterality: Right;  . SIMPLE MASTECTOMY WITH AXILLARY SENTINEL NODE BIOPSY Left 04/12/2014   Procedure: MASTECTOMY;  Surgeon: Imogene Burn. Georgette Dover, MD;  Location: [place];  Service: General;  Laterality: Left;  . TISSUE EXPANDER PLACEMENT Left 04/12/2014   Procedure: TISSUE EXPANDER;  Surgeon: Irene Limbo, MD;  Location: [place];  Service: Plastics;  Laterality: Left;  . TISSUE EXPANDER PLACEMENT Left 05/22/2014   Procedure: PLACEMENT OF TISSUE EXPANDER/I&D WASHOUT SEROMA;  Surgeon: Irene Limbo, MD;  Location: [place];  Service: Plastics;  Laterality: Left;    Family History  Problem Relation Age of Onset  . Breast cancer Mother 17  . Heart disease Father   . Heart attack Father   . Breast cancer Maternal Grandmother 20  . Brain cancer Maternal Uncle 34  benign brain tumor  . Heart attack Paternal Aunt     Social History:  reports that she has never smoked. She has never used smokeless tobacco. She reports that she drinks alcohol. She reports that she does not use drugs.  Allergies:  Allergies  Allergen Reactions  . Adhesive [Tape] Rash    Band-aids     Medications: I have reviewed the patient's current medications.  Results for orders placed or performed during the hospital encounter of 04/05/16 (from the past 48 hour(s))  Urinalysis, Routine w reflex microscopic     Status: Abnormal   Collection Time: 04/05/16  8:40 PM  Result Value Ref Range   Color, Urine YELLOW YELLOW   APPearance CLEAR CLEAR   Specific Gravity, Urine 1.027 1.005 - 1.030   pH 7.0 5.0 - 8.0   Glucose, UA NEGATIVE NEGATIVE mg/dL   Hgb urine dipstick NEGATIVE NEGATIVE   Bilirubin Urine NEGATIVE NEGATIVE   Ketones, ur NEGATIVE NEGATIVE mg/dL   Protein, ur 100 (A) NEGATIVE mg/dL   Nitrite NEGATIVE NEGATIVE   Leukocytes, UA NEGATIVE NEGATIVE  Urine microscopic-add on     Status: Abnormal   Collection Time: 04/05/16  8:40 PM  Result Value Ref Range   Squamous Epithelial / LPF 0-5 (A) NONE SEEN   WBC, UA 0-5 0 - 5 WBC/hpf   RBC / HPF 0-5 0 - 5 RBC/hpf   Bacteria, UA RARE (A) NONE SEEN   Casts HYALINE CASTS (A) NEGATIVE   Urine-Other MUCOUS PRESENT   Comprehensive metabolic panel     Status: Abnormal   Collection Time: 04/05/16  8:54 PM  Result Value Ref Range   Sodium 137 135 - 145 mmol/L   Potassium 3.4 (L) 3.5 - 5.1 mmol/L   Chloride 100 (L) 101 - 111 mmol/L   CO2 24 22 - 32 mmol/L   Glucose, Bld 140 (H) 65 - 99 mg/dL   BUN 7 6 - 20 mg/dL   Creatinine, Ser 0.84 0.44 - 1.00 mg/dL   Calcium 9.3 8.9 - 10.3 mg/dL   Total Protein 7.0 6.5 - 8.1 g/dL   Albumin 4.0 3.5 - 5.0 g/dL   AST 61 (H) 15 - 41 U/L   ALT 63 (H) 14 - 54 U/L   Alkaline Phosphatase 83 38 - 126 U/L   Total Bilirubin 0.3 0.3 - 1.2 mg/dL   GFR calc non Af Amer >60 >60 mL/min   GFR calc Af Amer >60 >60 mL/min    Comment: (NOTE) The eGFR has been calculated using the CKD EPI equation. This calculation has not been validated in all clinical situations. eGFR's persistently <60 mL/min signify possible Chronic Kidney Disease.    Anion gap 13 5 - 15  CBC with Differential     Status:  Abnormal   Collection Time: 04/05/16  8:54 PM  Result Value Ref Range   WBC 14.0 (H) 4.0 - 10.5 K/uL   RBC 4.34 3.87 - 5.11 MIL/uL   Hemoglobin 13.5 12.0 - 15.0 g/dL   HCT 41.6 36.0 - 46.0 %   MCV 95.9 78.0 - 100.0 fL   MCH 31.1 26.0 - 34.0 pg   MCHC 32.5 30.0 - 36.0 g/dL   RDW 13.0 11.5 - 15.5 %   Platelets 287 150 - 400 K/uL   Neutrophils Relative % 87 %   Neutro Abs 12.2 (H) 1.7 - 7.7 K/uL   Lymphocytes Relative 6 %   Lymphs Abs 0.9 0.7 - 4.0 K/uL   Monocytes Relative 7 %  Monocytes Absolute 1.0 0.1 - 1.0 K/uL   Eosinophils Relative 0 %   Eosinophils Absolute 0.0 0.0 - 0.7 K/uL   Basophils Relative 0 %   Basophils Absolute 0.0 0.0 - 0.1 K/uL  I-Stat CG4 Lactic Acid, ED     Status: Abnormal   Collection Time: 04/05/16 10:26 PM  Result Value Ref Range   Lactic Acid, Venous 3.11 (HH) 0.5 - 1.9 mmol/L   Comment NOTIFIED PHYSICIAN   Brain natriuretic peptide     Status: None   Collection Time: 04/06/16  2:29 AM  Result Value Ref Range   B Natriuretic Peptide 61.6 0.0 - 100.0 pg/mL  Sedimentation rate     Status: None   Collection Time: 04/06/16  2:29 AM  Result Value Ref Range   Sed Rate 9 0 - 22 mm/hr  C-reactive protein     Status: Abnormal   Collection Time: 04/06/16  2:29 AM  Result Value Ref Range   CRP 7.4 (H) <1.0 mg/dL  Procalcitonin     Status: None   Collection Time: 04/06/16  2:29 AM  Result Value Ref Range   Procalcitonin 0.12 ng/mL    Comment:        Interpretation: PCT (Procalcitonin) <= 0.5 ng/mL: Systemic infection (sepsis) is not likely. Local bacterial infection is possible. (NOTE)         ICU PCT Algorithm               Non ICU PCT Algorithm    ----------------------------     ------------------------------         PCT < 0.25 ng/mL                 PCT < 0.1 ng/mL     Stopping of antibiotics            Stopping of antibiotics       strongly encouraged.               strongly encouraged.    ----------------------------      ------------------------------       PCT level decrease by               PCT < 0.25 ng/mL       >= 80% from peak PCT       OR PCT 0.25 - 0.5 ng/mL          Stopping of antibiotics                                             encouraged.     Stopping of antibiotics           encouraged.    ----------------------------     ------------------------------       PCT level decrease by              PCT >= 0.25 ng/mL       < 80% from peak PCT        AND PCT >= 0.5 ng/mL            Continuin g antibiotics                                              encouraged.  Continuing antibiotics            encouraged.    ----------------------------     ------------------------------     PCT level increase compared          PCT > 0.5 ng/mL         with peak PCT AND          PCT >= 0.5 ng/mL             Escalation of antibiotics                                          strongly encouraged.      Escalation of antibiotics        strongly encouraged.   Protime-INR     Status: None   Collection Time: 04/06/16  2:29 AM  Result Value Ref Range   Prothrombin Time 13.9 11.6 - 15.2 seconds   INR 1.05 0.00 - 1.49  APTT     Status: None   Collection Time: 04/06/16  2:29 AM  Result Value Ref Range   aPTT 24 24 - 37 seconds  HIV antibody     Status: None   Collection Time: 04/06/16  2:29 AM  Result Value Ref Range   HIV Screen 4th Generation wRfx Non Reactive Non Reactive    Comment: (NOTE) Performed At: [facility] [place], [place] [id number] Lindon Romp MD [id number]   Basic metabolic panel     Status: Abnormal   Collection Time: 04/06/16  2:29 AM  Result Value Ref Range   Sodium 136 135 - 145 mmol/L   Potassium 3.5 3.5 - 5.1 mmol/L   Chloride 103 101 - 111 mmol/L   CO2 25 22 - 32 mmol/L   Glucose, Bld 118 (H) 65 - 99 mg/dL   BUN 6 6 - 20 mg/dL   Creatinine, Ser 0.69 0.44 - 1.00 mg/dL   Calcium 8.5 (L) 8.9 - 10.3 mg/dL   GFR calc non Af Amer >60 >60 mL/min    GFR calc Af Amer >60 >60 mL/min    Comment: (NOTE) The eGFR has been calculated using the CKD EPI equation. This calculation has not been validated in all clinical situations. eGFR's persistently <60 mL/min signify possible Chronic Kidney Disease.    Anion gap 8 5 - 15  CBC     Status: Abnormal   Collection Time: 04/06/16  2:29 AM  Result Value Ref Range   WBC 11.9 (H) 4.0 - 10.5 K/uL   RBC 4.06 3.87 - 5.11 MIL/uL   Hemoglobin 12.6 12.0 - 15.0 g/dL   HCT 39.1 36.0 - 46.0 %   MCV 96.3 78.0 - 100.0 fL   MCH 31.0 26.0 - 34.0 pg   MCHC 32.2 30.0 - 36.0 g/dL   RDW 13.1 11.5 - 15.5 %   Platelets 238 150 - 400 K/uL  Lactic acid, plasma     Status: Abnormal   Collection Time: 04/06/16  2:34 AM  Result Value Ref Range   Lactic Acid, Venous 3.0 (HH) 0.5 - 1.9 mmol/L    Comment: CRITICAL RESULT CALLED TO, READ BACK BY AND VERIFIED WITH: SHACKLETT M,RN 04/06/16 0312 WAYK   Lactic acid, plasma     Status: Abnormal   Collection Time: 04/06/16  5:49 AM  Result Value Ref Range   Lactic Acid, Venous 4.0 (HH) 0.5 -  1.9 mmol/L    Comment: CRITICAL RESULT CALLED TO, READ BACK BY AND VERIFIED WITH: Armanda Magic [id number] [facility]   CBC with Differential     Status: Abnormal   Collection Time: 04/06/16  1:38 PM  Result Value Ref Range   WBC 9.3 4.0 - 10.5 K/uL   RBC 3.67 (L) 3.87 - 5.11 MIL/uL   Hemoglobin 11.3 (L) 12.0 - 15.0 g/dL   HCT 34.8 (L) 36.0 - 46.0 %   MCV 94.8 78.0 - 100.0 fL   MCH 30.8 26.0 - 34.0 pg   MCHC 32.5 30.0 - 36.0 g/dL   RDW 13.1 11.5 - 15.5 %   Platelets 231 150 - 400 K/uL   Neutrophils Relative % 82 %   Neutro Abs 7.6 1.7 - 7.7 K/uL   Lymphocytes Relative 10 %   Lymphs Abs 0.9 0.7 - 4.0 K/uL   Monocytes Relative 8 %   Monocytes Absolute 0.8 0.1 - 1.0 K/uL   Eosinophils Relative 0 %   Eosinophils Absolute 0.0 0.0 - 0.7 K/uL   Basophils Relative 0 %   Basophils Absolute 0.0 0.0 - 0.1 K/uL  Lactic acid, plasma     Status: Abnormal   Collection Time: 04/06/16   1:38 PM  Result Value Ref Range   Lactic Acid, Venous 2.1 (HH) 0.5 - 1.9 mmol/L    Comment: CRITICAL RESULT CALLED TO, READ BACK BY AND VERIFIED WITH: F.PASEDA,RN 04/06/16 1457 BY BSLADE   Procalcitonin     Status: None   Collection Time: 04/06/16  1:38 PM  Result Value Ref Range   Procalcitonin 0.19 ng/mL    Comment:        Interpretation: PCT (Procalcitonin) <= 0.5 ng/mL: Systemic infection (sepsis) is not likely. Local bacterial infection is possible. (NOTE)         ICU PCT Algorithm               Non ICU PCT Algorithm    ----------------------------     ------------------------------         PCT < 0.25 ng/mL                 PCT < 0.1 ng/mL     Stopping of antibiotics            Stopping of antibiotics       strongly encouraged.               strongly encouraged.    ----------------------------     ------------------------------       PCT level decrease by               PCT < 0.25 ng/mL       >= 80% from peak PCT       OR PCT 0.25 - 0.5 ng/mL          Stopping of antibiotics                                             encouraged.     Stopping of antibiotics           encouraged.    ----------------------------     ------------------------------       PCT level decrease by              PCT >= 0.25 ng/mL       < 80%  from peak PCT        AND PCT >= 0.5 ng/mL            Continuin g antibiotics                                              encouraged.       Continuing antibiotics            encouraged.    ----------------------------     ------------------------------     PCT level increase compared          PCT > 0.5 ng/mL         with peak PCT AND          PCT >= 0.5 ng/mL             Escalation of antibiotics                                          strongly encouraged.      Escalation of antibiotics        strongly encouraged.   Protime-INR     Status: None   Collection Time: 04/06/16  1:38 PM  Result Value Ref Range   Prothrombin Time 14.3 11.6 - 15.2 seconds   INR  1.09 0.00 - 1.49  APTT     Status: None   Collection Time: 04/06/16  1:38 PM  Result Value Ref Range   aPTT 32 24 - 37 seconds    Dg Chest Port 1 View  Result Date: 04/06/2016 CLINICAL DATA:  55 year old female with sepsis and cellulitis of the left hand. No chest complaints. EXAM: PORTABLE CHEST 1 VIEW COMPARISON:  Chest CT dated 10/1913 FINDINGS: Single portable view of the chest does not demonstrate a focal consolidation. There is no pleural effusion or pneumothorax. An apparent rounded area of increased density in the left lower lung field likely represents breast implant. The cardiac silhouette is within normal limits. No acute osseous pathology identified. IMPRESSION: No acute cardiopulmonary process. Electronically Signed   By: Anner Crete M.D.   On: 04/06/2016 00:27  Dg Hand Complete Left  Result Date: 04/05/2016 CLINICAL DATA:  Left hand swelling and redness, no known injury, initial encounter EXAM: LEFT HAND - COMPLETE 3+ VIEW COMPARISON:  None. FINDINGS: Generalized soft tissue swelling is noted consistent with the given clinical history of lymphedema. No acute fracture or dislocation is noted. IMPRESSION: Generalized soft tissue swelling without acute bony abnormality. Electronically Signed   By: Inez Catalina M.D.   On: 04/05/2016 20:59   Review of Systems  Constitutional: Positive for chills and fever. Negative for weight loss.  Genitourinary: Negative for dysuria.  Musculoskeletal: Negative for myalgias.  Skin:       History of left breast cancer status post lymph node dissection 2 years ago. After the patient's breast surgery she did have a problem with MSSA infection at the left breast.  Psychiatric/Behavioral: Negative for depression and hallucinations.  All other systems reviewed and are negative.  Blood pressure (P) 130/60, pulse (P) 76, temperature (P) 99.6 F (37.6 C), temperature source (P) Oral, resp. rate (P) 20, height _0  (1.6 m), weight 79.8 kg (176 lb),  SpO2 (P) 99 %. Physical Exam  Constitutional: She is oriented  to person, place, and time. She appears well-developed and well-nourished.  HENT:  Head: Normocephalic.  Eyes: Pupils are equal, round, and reactive to light.  Neck: Normal range of motion. Neck supple.  Cardiovascular: Normal rate.   Respiratory: Effort normal.  GI: Soft.  Musculoskeletal:  Left hand lymphedema extending up to the mid humeral level. She has cellulitis extending from the fingertips worse on the dorsum of the hand than the volar portion of the hand. There is markings on the skin and there is been slight resolution of the cellulitis in the last 24 hours. She has pain with attempted flexion of the fingertips primarily with tightness of the extensors. Small spot over the dorsum of the hand may have been a mosquito bite. No lacerations are seen she has slight numbness of the fingertips subjectively.. There is some axillary transverse incision along the anterior axillary line from previous lymph node resection.  Neurological: She is alert and oriented to person, place, and time.  Psychiatric: She has a normal mood and affect. Her behavior is normal.    Assessment/Plan: Chronic lymphedema left upper extremity from previous lymph node resection due to breast cancer. She has developed cellulitis in the hand. She may have had a mosquito bite or insect bite on the dorsum which seems to be the central area of more swelling. It is extended up to the distal third of the forearm on the dorsal side. MRI scan shows no evidence of abscess. She should respond to IV antibiotics over couple days and could be on by mouth antibiotics. Will order hand OT therapy for range of motion. Consideration of a possible compressive glove in the future. I have ordered a PCR screen test and if positive for staph then would recommend standard mupirocin and CHG Bast sponges. Will follow thank you for the opportunity to see her in consultation. My cell phone  [phone].  Yarithza Mink C 04/06/2016, 8:29 PM

## 2016-04-06 NOTE — H&P (Signed)
History and Physical    Kelsey Mclaughlin GGY:[id number] DOB: 1961-06-12 DOA: 04/05/2016  Referring MD/NP/PA:   PCP: [name], MD   Patient coming from:  The patient is coming from home.  At baseline, pt is independent for most of ADL.       Chief Complaint: left hand swelling, redness and pain  HPI: Kelsey Mclaughlin is a 55 y.o. female with medical history significant of breast cancer (s/p of L mastectomy), lymphedema in left arm,  bronchitis, depression, stroke, who presents with left hand swelling, redness and pain.  Pt reported that she has chronic left arm lymphedema in left arm after L mastectomy and lymph node removal. She had recurrent infection in left hand in the past. She states that she started to have left hand pain, swelling and redness since this AM. The pain was constant, 10 out of 10 in severity. She also has fever and chills. She has nausea and vomited several times. No diarrhea or abdominal pain. Patient does not have chest pain, shortness breath, cough, symptoms of UTI or unilateral weakness.  ED Course: pt was found to have WBC 14.0, lacate 3.11, negative urinalysis, temperature 101, tachycardia, tachypnea, potassium 3.4, creatinine normal. Negative chest x-ray. X-ray of her left hand showed soft tissue swelling, no bony abnormalities.Patient Is placed on telemetry bed for observation.   Review of Systems:   General: has fevers, chills, no changes in body weight, has poor appetite, has fatigue HEENT: no blurry vision, hearing changes or sore throat Pulm: no dyspnea, coughing, wheezing CV: no chest pain, no palpitations Abd: has nausea, vomiting, no abdominal pain, diarrhea, constipation GU: no dysuria, burning on urination, increased urinary frequency, hematuria  Ext: no leg edema Neuro: no unilateral weakness, numbness, or tingling, no vision change or hearing loss Skin: has left hand swelling redness and pain, mainly in dorsal aspect MSK: No  muscle spasm, no deformity, no limitation of range of movement in spin Heme: No easy bruising.  Travel history: No recent long distant travel.  Allergy:  Allergies  Allergen Reactions  . Adhesive [Tape] Rash    Band-aids    Past Medical History:  Diagnosis Date  . Bronchitis    completed antibiotics 10/21/13/ states resolved  . Cancer ([place]) 1998, 2015   breast  . Chronic diastolic (congestive) heart failure ([place])   . Lymph edema    lt arm  . Stroke [facility]) 2004   TIA-  no problems since    Past Surgical History:  Procedure Laterality Date  . ABDOMINOPLASTY/PANNICULECTOMY    . BREAST IMPLANT EXCHANGE Left 09/03/2014   Procedure: LEFT REMOVAL TISSUE EXPANDERS WITH PLACEMENT OF SILICONE IMPLANT ;  Surgeon: Irene Limbo, MD;  Location: [place];  Service: Plastics;  Laterality: Left;  . BREAST RECONSTRUCTION Left 03/07/2015   Procedure: LEFT NIPPLE AEROLA CREATION WITH LOCAL FLAP/FULL THICKNESS SKIN GRAFT FROM GROIN;  Surgeon: Irene Limbo, MD;  Location: [place];  Service: Plastics;  Laterality: Left;  . BREAST SURGERY  1998   lumpectomy on left and removed lymphnodes in [place]  . LATISSIMUS FLAP TO BREAST Left 04/12/2014   Procedure: LEFT LATISSIMUS DORSI FLAP FOR LEFT BREAST RECONSTRUCTION AND PLACEMENT OF TISSUE EXPANDER;  Surgeon: Irene Limbo, MD;  Location: [place];  Service: Plastics;  Laterality: Left;  . LIPOSUCTION WITH LIPOFILLING Left 09/03/2014   Procedure:  LIPOFILLING TO LEFT CHEST  ;  Surgeon: Irene Limbo, MD;  Location: [place];  Service: Plastics;  Laterality:  Left;  . LIPOSUCTION WITH LIPOFILLING Left 03/07/2015   Procedure: LIPOFILLING TO LEFT BREAST;  Surgeon: Irene Limbo, MD;  Location: [place];  Service: Plastics;  Laterality: Left;  Marland Kitchen MASTOPEXY Right 09/03/2014   Procedure: RIGHT BREAST MASTOPEXY ;  Surgeon: Irene Limbo, MD;  Location: [place];   Service: Plastics;  Laterality: Right;  . PORT-A-CATH REMOVAL Right 12/24/2014   Procedure: REMOVAL PORT-A-CATH;  Surgeon: Donnie Mesa, MD;  Location: [place];  Service: General;  Laterality: Right;  . PORTACATH PLACEMENT Right 10/25/2013   Procedure: INSERTION PORT-A-CATH;  Surgeon: Imogene Burn. Georgette Dover, MD;  Location: [facility];  Service: General;  Laterality: Right;  . SIMPLE MASTECTOMY WITH AXILLARY SENTINEL NODE BIOPSY Left 04/12/2014   Procedure: MASTECTOMY;  Surgeon: Imogene Burn. Georgette Dover, MD;  Location: [place];  Service: General;  Laterality: Left;  . TISSUE EXPANDER PLACEMENT Left 04/12/2014   Procedure: TISSUE EXPANDER;  Surgeon: Irene Limbo, MD;  Location: [place];  Service: Plastics;  Laterality: Left;  . TISSUE EXPANDER PLACEMENT Left 05/22/2014   Procedure: PLACEMENT OF TISSUE EXPANDER/I&D WASHOUT SEROMA;  Surgeon: Irene Limbo, MD;  Location: [place];  Service: Plastics;  Laterality: Left;    Social History:  reports that she has never smoked. She has never used smokeless tobacco. She reports that she drinks alcohol. She reports that she does not use drugs.  Family History:  Family History  Problem Relation Age of Onset  . Breast cancer Mother 43  . Heart disease Father   . Heart attack Father   . Breast cancer Maternal Grandmother 80  . Brain cancer Maternal Uncle 74    benign brain tumor  . Heart attack Paternal Aunt      Prior to Admission medications   Medication Sig Start Date End Date Taking? Authorizing Provider  anastrozole (ARIMIDEX) 1 MG tablet TAKE 1 TABLET (1 MG TOTAL) BY MOUTH DAILY. 02/24/16  Yes [name], MD  desvenlafaxine (PRISTIQ) 50 MG 24 hr tablet Take 100 mg by mouth daily.    Yes Historical Provider, MD    Physical Exam: Vitals:   04/06/16 0015 04/06/16 0045 04/06/16 0200 04/06/16 0300  BP:    (!) 152/66  Pulse: 75 67 69 72  Resp: '18 19 21 20  ' Temp:    98.4 F (36.9 C)  TempSrc:    Oral  SpO2: 99% 100% 100% 100%  Weight:     79.8 kg (176 lb)  Height:    '5\' 3"'  (1.6 m)   General: Not in acute distress HEENT:       Eyes: PERRL, EOMI, no scleral icterus.       ENT: No discharge from the ears and nose, no pharynx injection, no tonsillar enlargement.        Neck: No JVD, no bruit, no mass felt. Heme: No neck lymph node enlargement. Cardiac: S1/S2, RRR, No murmurs, No gallops or rubs. Pulm: No rales, wheezing, rhonchi or rubs. Abd: Soft, nondistended, nontender, no rebound pain, no organomegaly, BS present. GU: No hematuria Ext: No pitting leg edema bilaterally. 2+DP/PT pulse bilaterally. Musculoskeletal: No joint deformities, No joint redness or warmth, no limitation of ROM in spin. Skin: has left hand swelling, redness, warmth and tenderness. Neuro: Alert, oriented X3, cranial nerves II-XII grossly intact, moves all extremities normally. Psych: Patient is not psychotic, no suicidal or hemocidal ideation.  Labs on Admission: I have personally reviewed following labs and imaging studies  CBC:  Recent Labs Lab 04/05/16 2054  04/06/16 0229  WBC 14.0* 11.9*  NEUTROABS 12.2*  --   HGB 13.5 12.6  HCT 41.6 39.1  MCV 95.9 96.3  PLT 287 756   Basic Metabolic Panel:  Recent Labs Lab 04/05/16 2054 04/06/16 0229  NA 137 136  K 3.4* 3.5  CL 100* 103  CO2 24 25  GLUCOSE 140* 118*  BUN 7 6  CREATININE 0.84 0.69  CALCIUM 9.3 8.5*   GFR: Estimated Creatinine Clearance: 80.5 mL/min (by C-G formula based on SCr of 0.8 mg/dL). Liver Function Tests:  Recent Labs Lab 04/05/16 2054  AST 61*  ALT 63*  ALKPHOS 83  BILITOT 0.3  PROT 7.0  ALBUMIN 4.0   No results for input(s): LIPASE, AMYLASE in the last 168 hours. No results for input(s): AMMONIA in the last 168 hours. Coagulation Profile:  Recent Labs Lab 04/06/16 0229  INR 1.05   Cardiac Enzymes: No results for input(s): CKTOTAL, CKMB, CKMBINDEX, TROPONINI in the last 168 hours. BNP (last 3 results) No results for input(s): PROBNP in the last  8760 hours. HbA1C: No results for input(s): HGBA1C in the last 72 hours. CBG: No results for input(s): GLUCAP in the last 168 hours. Lipid Profile: No results for input(s): CHOL, HDL, LDLCALC, TRIG, CHOLHDL, LDLDIRECT in the last 72 hours. Thyroid Function Tests: No results for input(s): TSH, T4TOTAL, FREET4, T3FREE, THYROIDAB in the last 72 hours. Anemia Panel: No results for input(s): VITAMINB12, FOLATE, FERRITIN, TIBC, IRON, RETICCTPCT in the last 72 hours. Urine analysis:    Component Value Date/Time   COLORURINE YELLOW 04/05/2016 2040   APPEARANCEUR CLEAR 04/05/2016 2040   LABSPEC 1.027 04/05/2016 2040   LABSPEC 1.005 05/22/2014 1130   PHURINE 7.0 04/05/2016 2040   GLUCOSEU NEGATIVE 04/05/2016 2040   GLUCOSEU Negative 05/22/2014 1130   HGBUR NEGATIVE 04/05/2016 2040   BILIRUBINUR NEGATIVE 04/05/2016 2040   BILIRUBINUR Negative 05/22/2014 1130   KETONESUR NEGATIVE 04/05/2016 2040   PROTEINUR 100 (A) 04/05/2016 2040   UROBILINOGEN 0.2 05/22/2014 1130   NITRITE NEGATIVE 04/05/2016 2040   LEUKOCYTESUR NEGATIVE 04/05/2016 2040   LEUKOCYTESUR Negative 05/22/2014 1130   Sepsis Labs: '@LABRCNTIP' (procalcitonin:4,lacticidven:4) )No results found for this or any previous visit (from the past 240 hour(s)).   Radiological Exams on Admission: Dg Chest Port 1 View  Result Date: 04/06/2016 CLINICAL DATA:  55 year old female with sepsis and cellulitis of the left hand. No chest complaints. EXAM: PORTABLE CHEST 1 VIEW COMPARISON:  Chest CT dated 10/1913 FINDINGS: Single portable view of the chest does not demonstrate a focal consolidation. There is no pleural effusion or pneumothorax. An apparent rounded area of increased density in the left lower lung field likely represents breast implant. The cardiac silhouette is within normal limits. No acute osseous pathology identified. IMPRESSION: No acute cardiopulmonary process. Electronically Signed   By: Anner Crete M.D.   On: 04/06/2016  00:27  Dg Hand Complete Left  Result Date: 04/05/2016 CLINICAL DATA:  Left hand swelling and redness, no known injury, initial encounter EXAM: LEFT HAND - COMPLETE 3+ VIEW COMPARISON:  None. FINDINGS: Generalized soft tissue swelling is noted consistent with the given clinical history of lymphedema. No acute fracture or dislocation is noted. IMPRESSION: Generalized soft tissue swelling without acute bony abnormality. Electronically Signed   By: Inez Catalina M.D.   On: 04/05/2016 20:59    EKG: Independently reviewed. Sinus rhythm, QTC 442, sIQ3T3, mild TWI in V2-V4  Assessment/Plan Principal Problem:   Cellulitis of left hand Active Problems:   Breast cancer of  upper-outer quadrant of left female breast ([place])   Lymph edema   Hypokalemia   Sepsis (HCC)   Chronic diastolic (congestive) heart failure (H[facility])  Cellulitis of left hand and sepsis: pt is septic with elevated lactate, leukocytosis, fever, tachycardia and tachypnea. Hemodynamically stable. Her swelling and redness are mainly in dorsal aspect, less likely to form a compartment syndrome.  - will place on tele bed for obs - Empiric antimicrobial treatment with vancomycin and Zosyn per pharmacy - PRN Zofran for nausea, and Percocet for pain - Blood cultures x 2  - ESR and CRP - will get Procalcitonin and trend lactic acid levels per sepsis protocol. - IVF: 3.0 L of NS bolus in ED, followed by 75 cc/h  Breast cancer of upper-outer quadrant of left female breast [facility] s/p of mastectomy. Has been followed up by Dr. Jana Hakim. Last seen was on 02/24/16. Patient is on anastrozole currently. -continue Anastrozole and f/u with Dr. Jana Hakim  Hypokalemia: K=3.4 -repleted  Chronic diastolic congestive heart failure: 2-D account 10/21/14 showed EF 60-65 percent with grade 2 diastolic dysfunction. Patient is not taking diuretics at home. No leg edema. No JVD. CHF is compensated. -Check BNP -Watch volume status closely while patient is  receiving aggressive IV fluid resuscitation.  DVT ppx: SQ Lovenox Code Status: Full code Family Communication: None at bed side. Disposition Plan:  Anticipate discharge back to previous home environment Consults called:  none Admission status: Obs / tele    Date of Service 04/06/2016    Ivor Costa [facility] Pager [phone]  If 7PM-7AM, please contact night-coverage [email or web address] Password [facility] 04/06/2016, 6:38 AM

## 2016-04-06 NOTE — ED Notes (Signed)
Admitting at bedside 

## 2016-04-06 NOTE — Progress Notes (Signed)
This RN and MD called the MRI unit to know when they will be coming to get the patient for the test, they said in another 20 minutes, patient family very upset, MD made aware, will continue to monitor

## 2016-04-06 NOTE — Progress Notes (Signed)
PROGRESS NOTE    Kelsey Mclaughlin  [id number] DOB: 09-25-1960 DOA: 04/05/2016 PCP: [name], MD   Brief Narrative:  Kelsey Mclaughlin is a pleasant 55 year old female with a history of breast cancer, initially diagnosed at the age of 44 in [place], pathology showing invasive carcinoma grade 2 stage II, status post lumpectomy with lymph node dissection, status post chemotherapy, found to have recurrence of disease in 2014 and has been under the care of Dr. Jana Hakim of medical oncology. She is currently ananastrozole. Her last appointment with Dr. Jana Hakim was on 02/24/2016. 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 She presented to the emergency department overnight with complaints of left had swelling, pain, erythema. In the emergency department initial lab work revealed white count of 14,000 with lactate of 3.11 and had a temperature of 101. She was started on broad-spectrum IV antimicrobial therapy with vancomycin and Zosyn. The following day lab work showing upward trend in lactate to 4.0. Her left hand continues to have significant swelling, stat MRI ordered and Dr. Lorin Mercy of orthopedic surgery was consulted.   Assessment & Plan:   Principal Problem:   Cellulitis of left hand Active Problems:   Breast cancer of upper-outer quadrant of left female breast (HCC)   Lymph edema   Hypokalemia   Sepsis (HCC)   Chronic diastolic (congestive) heart failure (HCC)  1.  Cellulitis/abscess of left hand -Kelsey Kelsey [name] admitted early this morning presenting to the emergency department with complaints of significant left hand pain, swelling, erythema. -She reported doing a lot of work in her garden and thinks it's possible she may have had some local trauma or perhaps bit by an insect.  -On my evaluation this morning she continues to have significant erythema of her left hand for which a stat MRI has been ordered.  Case was discussed with Dr. Lorin Mercy of orthopedic surgery who is on-call today for hand surgery and will evaluate patient this afternoon.  -Continue broad-spectrum IV antimicrobial therapy with vancomycin and Zosyn. She has a history of left breast wound infection that grew MSSA. Will order blood cultures.   2.  Sepsis -Present on admission, evidenced by a lactic acid of 4.0 with presenting white count of 14,000, temperature of 101, heart rate of 112, with source of infection likely coming from left hand cellulitis/abscess -Will place her on the sepsis protocol, have ordered 2 sets of blood cultures, follow lactate levels, continue IV fluids and broad-spectrum IV antibiotic therapy  3.  History of breast cancer -She has a history of left breast cancer diagnosed in the 1990s with recurrence in 2014, currently sees Dr.Magrinat and is on anastrozole therapy  4.  History of chronic diastolic congestive heart failure -Last transthoracic echocardiogram performed on 10/21/2014 that revealed grade 2 diastolic dysfunction with preserved ejection fraction of 60-65% -He is receiving IV fluids currently for management of sepsis, will monitor performed status carefully  DVT prophylaxis: Lovenox Code Status: Full code Family Communication:  Disposition Plan: Dr. Lorin Mercy of orthopedic surgery consulted today, continue IV antibiotics, stat MRI of left hand ordered  Consultants:   Dr. Lorin Mercy  Procedures:     Antimicrobials:   Vancomycin started on 04/06/2016  Zosyn started on 04/06/2016   Subjective: She thinks there may be some improvement to her left hand since presentation, denies nausea, vomiting, fevers, chills  Objective: Vitals:   04/06/16 0045 04/06/16 0200 04/06/16 0300 04/06/16 1033  BP:   (!) 152/66  114/61  Pulse: 67 69 72 98  Resp: 19 21 20 20   Temp:   98.4 F (36.9 C) 99.8 F (37.7 C)  TempSrc:   Oral Oral  SpO2: 100% 100% 100% 98%  Weight:   79.8 kg (176 lb)   Height:   5\' 3"   (1.6 m)     Intake/Output Summary (Last 24 hours) at 04/06/16 1130 Last data filed at 04/06/16 1031  Gross per 24 hour  Intake              740 ml  Output             1200 ml  Net             -460 ml   Filed Weights   04/05/16 1955 04/06/16 0300  Weight: 80 kg (176 lb 4.8 oz) 79.8 kg (176 lb)    Examination:  General exam: Appears calm and comfortable  Respiratory system: Clear to auscultation. Respiratory effort normal. Cardiovascular system: S1 & S2 heard, RRR. No JVD, murmurs, rubs, gallops or clicks. No pedal edema. Gastrointestinal system: Abdomen is nondistended, soft and nontender. No organomegaly or masses felt. Normal bowel sounds heard. Central nervous system: Alert and oriented. No focal neurological deficits. Extremities: Symmetric 5 x 5 power. Skin: There is significant swelling involving her left hand extending up to her wrist. There is significant tenderness with passive and active movement of left hand. I do not appreciate palpable masses, purulence, or any other drainage. I did not find port of entry of infection Psychiatry: Judgement and insight appear normal. Mood & affect appropriate.     Data Reviewed: I have personally reviewed following labs and imaging studies  CBC:  Recent Labs Lab 04/05/16 2054 04/06/16 0229  WBC 14.0* 11.9*  NEUTROABS 12.2*  --   HGB 13.5 12.6  HCT 41.6 39.1  MCV 95.9 96.3  PLT 287 [id number]   Basic Metabolic Panel:  Recent Labs Lab 04/05/16 2054 04/06/16 0229  NA 137 136  K 3.4* 3.5  CL 100* 103  CO2 24 25  GLUCOSE 140* 118*  BUN 7 6  CREATININE 0.84 0.69  CALCIUM 9.3 8.5*   GFR: Estimated Creatinine Clearance: 80.5 mL/min (by C-G formula based on SCr of 0.8 mg/dL). Liver Function Tests:  Recent Labs Lab 04/05/16 2054  AST 61*  ALT 63*  ALKPHOS 83  BILITOT 0.3  PROT 7.0  ALBUMIN 4.0   No results for input(s): LIPASE, AMYLASE in the last 168 hours. No results for input(s): AMMONIA in the last 168  hours. Coagulation Profile:  Recent Labs Lab 04/06/16 0229  INR 1.05   Cardiac Enzymes: No results for input(s): CKTOTAL, CKMB, CKMBINDEX, TROPONINI in the last 168 hours. BNP (last 3 results) No results for input(s): PROBNP in the last 8760 hours. HbA1C: No results for input(s): HGBA1C in the last 72 hours. CBG: No results for input(s): GLUCAP in the last 168 hours. Lipid Profile: No results for input(s): CHOL, HDL, LDLCALC, TRIG, CHOLHDL, LDLDIRECT in the last 72 hours. Thyroid Function Tests: No results for input(s): TSH, T4TOTAL, FREET4, T3FREE, THYROIDAB in the last 72 hours. Anemia Panel: No results for input(s): VITAMINB12, FOLATE, FERRITIN, TIBC, IRON, RETICCTPCT in the last 72 hours. Sepsis Labs:  Recent Labs Lab 04/05/16 2226 04/06/16 0229 04/06/16 0234 04/06/16 0549  PROCALCITON  --  0.12  --   --   LATICACIDVEN 3.11*  --  3.0* 4.0*    No results found for this or any previous visit (  from the past 240 hour(s)).       Radiology Studies: Dg Chest Port 1 View  Result Date: 04/06/2016 CLINICAL DATA:  55 year old female with sepsis and cellulitis of the left hand. No chest complaints. EXAM: PORTABLE CHEST 1 VIEW COMPARISON:  Chest CT dated 10/1913 FINDINGS: Single portable view of the chest does not demonstrate a focal consolidation. There is no pleural effusion or pneumothorax. An apparent rounded area of increased density in the left lower lung field likely represents breast implant. The cardiac silhouette is within normal limits. No acute osseous pathology identified. IMPRESSION: No acute cardiopulmonary process. Electronically Signed   By: Anner Crete M.D.   On: 04/06/2016 00:27  Dg Hand Complete Left  Result Date: 04/05/2016 CLINICAL DATA:  Left hand swelling and redness, no known injury, initial encounter EXAM: LEFT HAND - COMPLETE 3+ VIEW COMPARISON:  None. FINDINGS: Generalized soft tissue swelling is noted consistent with the given clinical history  of lymphedema. No acute fracture or dislocation is noted. IMPRESSION: Generalized soft tissue swelling without acute bony abnormality. Electronically Signed   By: Inez Catalina M.D.   On: 04/05/2016 20:59       Scheduled Meds: . anastrozole  1 mg Oral Daily  . desvenlafaxine  100 mg Oral Daily  . enoxaparin (LOVENOX) injection  40 mg Subcutaneous Daily  . piperacillin-tazobactam (ZOSYN)  IV  3.375 g Intravenous Q8H  . sodium chloride flush  3 mL Intravenous Q12H  . vancomycin  1,000 mg Intravenous Q12H   Continuous Infusions: . sodium chloride 75 mL/hr at 04/06/16 0418     LOS: 0 days    Time spent: No charge    Kelvin Cellar, MD [facility] Pager [phone]  If 7PM-7AM, please contact night-coverage [email or web address] Password [email or web address] 04/06/2016, 11:30 AM

## 2016-04-06 NOTE — Progress Notes (Signed)
MRI unit called to know when patient will be transported for the test. This RN was told that it would take 2-3 hours before the can come get the patient for the test and that she can not be seen earlier than that. Will continue to monitor

## 2016-04-06 NOTE — ED Notes (Signed)
Attempted report 

## 2016-04-06 NOTE — Progress Notes (Signed)
K. KIRBY n.p. TEXT PAGE CRITICAL LAB VALUE FOR LADIC ACID 4.0

## 2016-04-06 NOTE — Progress Notes (Signed)
Zamor E MD notified about then critical  lactic acid value of 2.1. No new order received  Will continue to monitor

## 2016-04-06 NOTE — Progress Notes (Signed)
Orthopedic Tech Progress Note Patient Details:  Kelsey Mclaughlin 10/13/1960 [id number]  Patient ID: [name], female   DOB: 1960/09/22, 55 y.o.   MRN: [id number]   [place] Pink 04/06/2016, 6:20 PMPut patient back in [place] sling.

## 2016-04-06 NOTE — Progress Notes (Signed)
MRI called again to find out when patient will be going for the test, they said another 30 minutes, patient family very upset, unit director made aware will continue to monitor.

## 2016-04-06 NOTE — ED Provider Notes (Signed)
[facility] DEPT Provider Note   CSN: [id number] Arrival date & time: 04/05/16  1925  First Provider Contact:  None       History   Chief Complaint Chief Complaint  Patient presents with  . Hand Pain  . Arm Swelling    HPI Kelsey Mclaughlin is a 55 y.o. female.  Kelsey Mclaughlin is a 55 y.o. female  with a hx of left breast cancer with lymph node resection in 1998 with recurrence in 2015 presents to the Emergency Department complaining of gradual, persistent, progressively worsening pain in the left arm onset this morning.  She reports the redness appeared in her hand around 12:00 today. Associated symptoms include fever (TMAX 102.7), rigors, nausea and vomiting. Pt has taken ibuprofen for the fever with some relief.  She reports minimal pain in the hand.  Pt and partner report previous episodes of this always treated with abx.  She has been hospitalized 1x prior for this - in [place].  She denies know injury to the hand.  She reports other bites to the arms from mosquitos, but reports this is normal for her.  Previous infections have begun in the fingers, but she is unsure where today's infection began.   No previous history of steroid usage, IVDU, HIV or diabetes.    The history is provided by the patient and the spouse. The history is limited by a language barrier. A language interpreter was used.    PCP: Sadie Haber  Past Medical History:  Diagnosis Date  . Bronchitis    completed antibiotics 10/21/13/ states resolved  . Cancer ([place]) 1998, 2015   breast  . Lymph edema    lt arm  . Stroke [facility]) 2004   TIA-  no problems since    Patient Active Problem List   Diagnosis Date Noted  . Lymph edema   . Recurrent breast cancer ([place]) 04/12/2014  . Hepatic steatosis 02/01/2014  . Breast cancer of upper-outer quadrant of left female breast ([place]) 10/12/2013  . Stroke [facility] 09/13/2002    Past Surgical History:  Procedure Laterality Date  .  ABDOMINOPLASTY/PANNICULECTOMY    . BREAST IMPLANT EXCHANGE Left 09/03/2014   Procedure: LEFT REMOVAL TISSUE EXPANDERS WITH PLACEMENT OF SILICONE IMPLANT ;  Surgeon: Irene Limbo, MD;  Location: [place];  Service: Plastics;  Laterality: Left;  . BREAST RECONSTRUCTION Left 03/07/2015   Procedure: LEFT NIPPLE AEROLA CREATION WITH LOCAL FLAP/FULL THICKNESS SKIN GRAFT FROM GROIN;  Surgeon: Irene Limbo, MD;  Location: [place];  Service: Plastics;  Laterality: Left;  . BREAST SURGERY  1998   lumpectomy on left and removed lymphnodes in [place]  . LATISSIMUS FLAP TO BREAST Left 04/12/2014   Procedure: LEFT LATISSIMUS DORSI FLAP FOR LEFT BREAST RECONSTRUCTION AND PLACEMENT OF TISSUE EXPANDER;  Surgeon: Irene Limbo, MD;  Location: [place];  Service: Plastics;  Laterality: Left;  . LIPOSUCTION WITH LIPOFILLING Left 09/03/2014   Procedure:  LIPOFILLING TO LEFT CHEST  ;  Surgeon: Irene Limbo, MD;  Location: [place];  Service: Plastics;  Laterality: Left;  . LIPOSUCTION WITH LIPOFILLING Left 03/07/2015   Procedure: LIPOFILLING TO LEFT BREAST;  Surgeon: Irene Limbo, MD;  Location: [place];  Service: Plastics;  Laterality: Left;  Marland Kitchen MASTOPEXY Right 09/03/2014   Procedure: RIGHT BREAST MASTOPEXY ;  Surgeon: Irene Limbo, MD;  Location: [place];  Service: Plastics;  Laterality: Right;  . PORT-A-CATH REMOVAL Right 12/24/2014   Procedure: REMOVAL PORT-A-CATH;  Surgeon: Donnie Mesa, MD;  Location: [place];  Service: General;  Laterality: Right;  . PORTACATH PLACEMENT Right 10/25/2013   Procedure: INSERTION PORT-A-CATH;  Surgeon: Imogene Burn. Georgette Dover, MD;  Location: [facility];  Service: General;  Laterality: Right;  . SIMPLE MASTECTOMY WITH AXILLARY SENTINEL NODE BIOPSY Left 04/12/2014   Procedure: MASTECTOMY;  Surgeon: Imogene Burn. Georgette Dover, MD;  Location: [place];  Service: General;  Laterality: Left;    . TISSUE EXPANDER PLACEMENT Left 04/12/2014   Procedure: TISSUE EXPANDER;  Surgeon: Irene Limbo, MD;  Location: [place];  Service: Plastics;  Laterality: Left;  . TISSUE EXPANDER PLACEMENT Left 05/22/2014   Procedure: PLACEMENT OF TISSUE EXPANDER/I&D WASHOUT SEROMA;  Surgeon: Irene Limbo, MD;  Location: [place];  Service: Plastics;  Laterality: Left;    OB History    No data available       Home Medications    Prior to Admission medications   Medication Sig Start Date End Date Taking? Authorizing Provider  anastrozole (ARIMIDEX) 1 MG tablet TAKE 1 TABLET (1 MG TOTAL) BY MOUTH DAILY. 02/24/16  Yes [name], MD  desvenlafaxine (PRISTIQ) 50 MG 24 hr tablet Take 100 mg by mouth daily.    Yes Historical Provider, MD    Family History Family History  Problem Relation Age of Onset  . Breast cancer Mother 51  . Heart disease Father   . Heart attack Father   . Breast cancer Maternal Grandmother 16  . Brain cancer Maternal Uncle 74    benign brain tumor  . Heart attack Paternal Aunt     Social History Social History  Substance Use Topics  . Smoking status: Never Smoker  . Smokeless tobacco: Never Used  . Alcohol use Yes     Comment: 2 drinks/day     Allergies   Adhesive [tape]   Review of Systems Review of Systems  Constitutional: Positive for chills, fatigue and fever.  Skin: Positive for color change and wound ( arms).  All other systems reviewed and are negative.    Physical Exam Updated Vital Signs BP 141/63 (BP Location: Right Arm)   Pulse 75   Temp 100.7 F (38.2 C) (Rectal)   Resp 18   Ht 5\' 4"  (1.626 m)   Wt 80 kg   SpO2 99%   BMI 30.26 kg/m   Physical Exam  Constitutional: She appears well-developed and well-nourished. No distress.  Awake, alert, nontoxic appearance  HENT:  Head: Normocephalic and atraumatic.  Mouth/Throat: Oropharynx is clear and moist. No oropharyngeal exudate.  Eyes: Conjunctivae are normal. No scleral icterus.   Neck: Normal range of motion. Neck supple.  Cardiovascular: Normal rate, regular rhythm and intact distal pulses.   Pulmonary/Chest: Effort normal and breath sounds normal. No respiratory distress. She has no wheezes.  Equal chest expansion Right upper chest with scar from previous port insertion - port is no longer present  Abdominal: Soft. Bowel sounds are normal. She exhibits no mass. There is no tenderness. There is no rebound and no guarding.  Musculoskeletal: Normal range of motion. She exhibits no edema.  Left arm with lymphedema Left hand with swelling and erythema to the dorsum extending down into the thumb and along the ring and little fingers; increased warmth to the area; no fluctuance; very small wound at the cuticle of the left thumb without evidence of paronychia  Neurological: She is alert.  Speech is clear and goal oriented Moves extremities without ataxia  Skin: Skin is warm and dry.  She is not diaphoretic. There is erythema ( Dorsum of the right hand).  Psychiatric: She has a normal mood and affect.  Nursing note and vitals reviewed.    ED Treatments / Results  Labs (all labs ordered are listed, but only abnormal results are displayed) Labs Reviewed  COMPREHENSIVE METABOLIC PANEL - Abnormal; Notable for the following:       Result Value   Potassium 3.4 (*)    Chloride 100 (*)    Glucose, Bld 140 (*)    AST 61 (*)    ALT 63 (*)    All other components within normal limits  URINALYSIS, ROUTINE W REFLEX MICROSCOPIC (NOT AT [facility]) - Abnormal; Notable for the following:    Protein, ur 100 (*)    All other components within normal limits  CBC WITH DIFFERENTIAL/PLATELET - Abnormal; Notable for the following:    WBC 14.0 (*)    Neutro Abs 12.2 (*)    All other components within normal limits  URINE MICROSCOPIC-ADD ON - Abnormal; Notable for the following:    Squamous Epithelial / LPF 0-5 (*)    Bacteria, UA RARE (*)    Casts HYALINE CASTS (*)    All other  components within normal limits  I-STAT CG4 LACTIC ACID, ED - Abnormal; Notable for the following:    Lactic Acid, Venous 3.11 (*)    All other components within normal limits  URINE CULTURE  I-STAT CG4 LACTIC ACID, ED    EKG  EKG Interpretation None       Radiology Dg Chest Port 1 View  Result Date: 04/06/2016 CLINICAL DATA:  55 year old female with sepsis and cellulitis of the left hand. No chest complaints. EXAM: PORTABLE CHEST 1 VIEW COMPARISON:  Chest CT dated 10/1913 FINDINGS: Single portable view of the chest does not demonstrate a focal consolidation. There is no pleural effusion or pneumothorax. An apparent rounded area of increased density in the left lower lung field likely represents breast implant. The cardiac silhouette is within normal limits. No acute osseous pathology identified. IMPRESSION: No acute cardiopulmonary process. Electronically Signed   By: Anner Crete M.D.   On: 04/06/2016 00:27  Dg Hand Complete Left  Result Date: 04/05/2016 CLINICAL DATA:  Left hand swelling and redness, no known injury, initial encounter EXAM: LEFT HAND - COMPLETE 3+ VIEW COMPARISON:  None. FINDINGS: Generalized soft tissue swelling is noted consistent with the given clinical history of lymphedema. No acute fracture or dislocation is noted. IMPRESSION: Generalized soft tissue swelling without acute bony abnormality. Electronically Signed   By: Inez Catalina M.D.   On: 04/05/2016 20:59   Procedures Procedures (including critical care time)  Medications Ordered in ED Medications  sodium chloride 0.9 % bolus 1,000 mL (1,000 mLs Intravenous New Bag/Given 04/05/16 1215)    And  sodium chloride 0.9 % bolus 1,000 mL (1,000 mLs Intravenous New Bag/Given 04/06/16 0105)    And  sodium chloride 0.9 % bolus 500 mL (500 mLs Intravenous New Bag/Given 04/06/16 0105)  acetaminophen (TYLENOL) tablet 500 mg (650 mg Oral Given 04/05/16 2017)  piperacillin-tazobactam (ZOSYN) IVPB 3.375 g (0 g  Intravenous Stopped 04/06/16 0105)  vancomycin (VANCOCIN) IVPB 1000 mg/200 mL premix (1,000 mg Intravenous New Bag/Given 04/06/16 0011)     Initial Impression / Assessment and Plan / ED Course  I have reviewed the triage vital signs and the nursing notes.  Pertinent labs & imaging results that were available during my care of the patient were reviewed by me and considered  in my medical decision making (see chart for details).  Clinical Course  Value Comment By Time  Lactic Acid, Venous: (!!) 3.11 High Abigail Butts, PA-C 07/25 0046  Potassium: (!) 3.4 Mild Abigail Butts, PA-C 07/25 0046  WBC: (!) 14.0 high Abigail Butts, PA-C 07/25 0046  NEUT#: (!) 12.2 Left shift Abigail Butts, PA-C [id number]   Shajuana L [name] presents with Left arm erythema and swelling. Elevated lactic acid, febrile with leukocytosis. Patient meets sepsis criteria. Fluid bolus and antibiotics begun. X-ray of the hand is without acute fracture. No free air to suggest necrotizing fasciitis. Patient is without tachycardia.  She will need admission for antibiotics, fluids and monitoring. She has not previously required to our washout for similar problems. No puncture wound or open wound.  No area of fluctuance to suggest abscess.  1:34 AM Discussed with Dr. Blaine Hamper who will admit to tele obs.    Final Clinical Impressions(s) / ED Diagnoses   Final diagnoses:  Cellulitis of left upper extremity  Sepsis, due to unspecified organism [facility])  Lymph edema    New Prescriptions New Prescriptions   No medications on file     Abigail Butts, PA-C 04/06/16 0134    Varney Biles, MD 04/06/16 0700

## 2016-04-06 NOTE — Progress Notes (Signed)
Pharmacy Antibiotic Note  Kelsey Mclaughlin is a 55 y.o. female admitted on 04/05/2016 with L arm swelling and cellulitis, possible sepsis.  Pharmacy has been consulted for Vancomycin and Zosyn  Dosing.  Vancomycin 1 g IV given in ED at  midnight  Plan: Vancomycin 1 g IV q12h Zosyn 3.375 g IV q8h   Height: 5\' 3"  (160 cm) Weight: 176 lb (79.8 kg) IBW/kg (Calculated) : 52.4  Temp (24hrs), Avg:100 F (37.8 C), Min:98.4 F (36.9 C), Max:101 F (38.3 C)   Recent Labs Lab 04/05/16 2054 04/05/16 2226 04/06/16 0229 04/06/16 0234  WBC 14.0*  --  11.9*  --   CREATININE 0.84  --  0.69  --   LATICACIDVEN  --  3.11*  --  3.0*    Estimated Creatinine Clearance: 80.5 mL/min (by C-G formula based on SCr of 0.8 mg/dL).    Allergies  Allergen Reactions  . Adhesive [Tape] Rash    Band-aids    Caryl Pina 04/06/2016 5:59 AM

## 2016-04-06 NOTE — Progress Notes (Signed)
Orthopedic Tech Progress Note Patient Details:  Kelsey Mclaughlin 1960-09-17 [id number]  Ortho Devices Type of Ortho Device: Arm sling Ortho Device/Splint Location: [place] sling Ortho Device/Splint Interventions: Application   [place] Pink 04/06/2016, 5:07 PM

## 2016-04-07 DIAGNOSIS — C50412 Malignant neoplasm of upper-outer quadrant of left female breast: Secondary | ICD-10-CM

## 2016-04-07 DIAGNOSIS — M8580 Other specified disorders of bone density and structure, unspecified site: Secondary | ICD-10-CM

## 2016-04-07 DIAGNOSIS — L03114 Cellulitis of left upper limb: Secondary | ICD-10-CM

## 2016-04-07 DIAGNOSIS — I5032 Chronic diastolic (congestive) heart failure: Secondary | ICD-10-CM

## 2016-04-07 LAB — URINE CULTURE

## 2016-04-07 LAB — CBC
HCT: 37.5 % (ref 36.0–46.0)
Hemoglobin: 12.1 g/dL (ref 12.0–15.0)
MCH: 30.9 pg (ref 26.0–34.0)
MCHC: 32.3 g/dL (ref 30.0–36.0)
MCV: 95.7 fL (ref 78.0–100.0)
PLATELETS: 214 10*3/uL (ref 150–400)
RBC: 3.92 MIL/uL (ref 3.87–5.11)
RDW: 13 % (ref 11.5–15.5)
WBC: 8.4 10*3/uL (ref 4.0–10.5)

## 2016-04-07 LAB — BASIC METABOLIC PANEL
Anion gap: 6 (ref 5–15)
CALCIUM: 8.6 mg/dL — AB (ref 8.9–10.3)
CO2: 26 mmol/L (ref 22–32)
Chloride: 104 mmol/L (ref 101–111)
Creatinine, Ser: 0.58 mg/dL (ref 0.44–1.00)
GFR calc Af Amer: >60 mL/min (ref >60)
GLUCOSE: 108 mg/dL — AB (ref 65–99)
POTASSIUM: 2.8 mmol/L — AB (ref 3.5–5.1)
SODIUM: 136 mmol/L (ref 135–145)

## 2016-04-07 MED ORDER — POTASSIUM CHLORIDE CRYS ER 20 MEQ PO TBCR
40.0000 meq | EXTENDED_RELEASE_TABLET | ORAL | Status: AC
  Administered 2016-04-07 (×3): 40 meq via ORAL
  Filled 2016-04-07 (×3): qty 2

## 2016-04-07 NOTE — Progress Notes (Signed)
Occupational Therapy Evaluation Patient Details Name: Kelsey Mclaughlin MRN: [id number] DOB: Jul 27, 1961 Today's Date: 04/07/2016    History of Present Illness 55 y.o. female admitted for L hand pain, swelling, and erythema. PMH sigificant for breast cancer s/p lumpectomy w/ lymph node dissection and s/p chemotherapy, hypokalemia, chronic diastolic CHF.   Clinical Impression   PTA, pt was independent with all ADLs and mobility. Pt currently presents with L hand and forearm non-pitting edema and erythema impacting her ability to carry out ADLs independently. Educated pt on elevation strategies, AROM exercises, massage and skin protection strategies. Pt plans to d/c home with 24/7 assistance from her husband. Pt will benefit from continued acute OT to increase independence and safety with ADLs and to manage edema symptoms and resulting pain. Will continue to follow acutely.    Follow Up Recommendations  No OT follow up    Equipment Recommendations  None recommended by OT    Recommendations for Other Services       Precautions / Restrictions Precautions Precautions: None Restrictions Weight Bearing Restrictions: No      Mobility Bed Mobility Overal bed mobility: Modified Independent                Transfers Overall transfer level: Needs assistance Equipment used: None Transfers: Sit to/from Stand Sit to Stand: Supervision         General transfer comment: Supervision for safety. Pt holds LUE on top of head when mobilizing due to increased pain.    Balance Overall balance assessment: No apparent balance deficits (not formally assessed)                                          ADL Overall ADL's : Needs assistance/impaired Eating/Feeding: Minimal assistance;Sitting Eating/Feeding Details (indicate cue type and reason): to open packaging/containters Grooming: Wash/dry hands;Wash/dry face;Oral care;Set up;Standing   Upper Body Bathing:  Supervision/ safety;Sitting   Lower Body Bathing: Supervison/ safety;Sit to/from stand   Upper Body Dressing : Supervision/safety;Sitting   Lower Body Dressing: Supervision/safety;Sit to/from stand   Toilet Transfer: Supervision/safety;Ambulation;Regular Toilet   Toileting- Water quality scientist and Hygiene: Supervision/safety;Sit to/from stand         General ADL Comments: Pt tends to hold LUE on top of her head while mobilizing and completing ADLs. Pt is able to use L hand to assist with ADL completion and has been encouraged to continue doing so to facilitate reduction in hand swelling.     Vision Vision Assessment?: No apparent visual deficits   Perception     Praxis      Pertinent Vitals/Pain Pain Assessment: 0-10 Pain Score: 7  Pain Location: when mobilizing and LUE is not elevated Pain Descriptors / Indicators: Throbbing Pain Intervention(s): Limited activity within patient's tolerance;Monitored during session;Repositioned     Hand Dominance Right   Extremity/Trunk Assessment Upper Extremity Assessment Upper Extremity Assessment: LUE deficits/detail LUE Deficits / Details: non-pitting edema; present from fingertip to elbow; worse distally. Limited finger flexion/extension/abduction and wrist flexion/extension LUE: Unable to fully assess due to pain LUE Coordination: decreased fine motor   Lower Extremity Assessment Lower Extremity Assessment: Overall WFL for tasks assessed   Cervical / Trunk Assessment Cervical / Trunk Assessment: Normal   Communication Communication Communication: Prefers language other than English [place])   Cognition Arousal/Alertness: Awake/alert Behavior During Therapy: WFL for tasks assessed/performed  General Comments       Exercises Exercises: Hand exercises;Other exercises Other Exercises Other Exercises: tendon glides, x10, L hand, seated/supine Other Exercises: L shoulder flexion, x10, with  arms crossed across chest to clear supraclavicular region vessels Other Exercises: Massage L upper arm to clear axillary and elbow region vessels; with L hand touching behind head (shoulder and elbow flexed) Other Exercises: Retrograde massage (after all supraclavicular, axillary, elbow regions cleared); beginning at fingertips-hand, hand-wrist, wrist-forearm, forearm-elbow Other Exercises: Reiterated importance of elevation, massage and AROM with exercises and daily tasks   Shoulder Instructions      Home Living Family/patient expects to be discharged to:: Private residence Living Arrangements: Spouse/significant other Available Help at Discharge: Family;Available 24 hours/day (husband works from home) Type of Home: House Home Access: Level entry     [place]: One level     Bathroom Shower/Tub: Occupational psychologist: [place]: None          Prior Functioning/Environment Level of Independence: Independent             OT Diagnosis: Acute pain   OT Problem List: Decreased strength;Decreased range of motion;Decreased coordination;Decreased safety awareness;Increased edema;Impaired UE functional use;Pain   OT Treatment/Interventions: Self-care/ADL training;Therapeutic exercise;Therapeutic activities;Patient/family education    OT Goals(Current goals can be found in the care plan section) Acute Rehab OT Goals Patient Stated Goal: to get better and go home OT Goal Formulation: With patient Time For Goal Achievement: 04/21/16 Potential to Achieve Goals: Good ADL Goals Pt Will Perform Upper Body Bathing: with modified independence;sitting Pt Will Perform Upper Body Dressing: with modified independence;sitting Pt/caregiver will Perform Home Exercise Program: Increased ROM;Left upper extremity;Independently;With written HEP provided Additional ADL Goal #1: Pt will demonstrate understanding of edema management strategies including elevation,  AROM exercises, skin care, and compression garment.  OT Frequency: Min 3X/week   Barriers to D/C:            Co-evaluation              End of Session Nurse Communication: Mobility status  Activity Tolerance: Patient tolerated treatment well Patient left: in bed;with call bell/phone within reach   Time: [id number] OT Time Calculation (min): 25 min Charges:  OT General Charges $OT Visit: 1 Procedure OT Evaluation $OT Eval Moderate Complexity: 1 Procedure OT Treatments $Therapeutic Exercise: 8-22 mins G-Codes:    Redmond Baseman, OTR/L Pager: [phone] 04/07/2016, 9:25 AM

## 2016-04-07 NOTE — Progress Notes (Signed)
Kelsey Mclaughlin   DOB:Sep 14, 1960   WE#:[id number]   YPP#:[id number]  Subjective: she tells me she had a scratch on a finger while gardening, then developed left arm pain, no redness, then redness and swelling. She did not call our office but presented directly to ED 07/24 where she met sepsis criteria and was admitted; started on zosyn/vanco early AM 04/06/2016 as best as I can tell. -- Currently tells me the pain in the arm and swelling is better. She has no other complaints   Objective: middle aged [place] speaker examined in bed Vitals:   04/06/16 2037 04/07/16 0459  BP: (!) 141/80 (!) 145/85  Pulse: 67 76  Resp: 18 16  Temp: 98.3 F (36.8 C) 99.1 F (37.3 C)    Body mass index is 31.18 kg/m.  Intake/Output Summary (Last 24 hours) at 04/07/16 0734 Last data filed at 04/06/16 2151  Gross per 24 hour  Intake              840 ml  Output             1200 ml  Net             -360 ml     Sclerae unicteric  No cervical or supraclavicular adenopathy  Lungs no rales or wheezes--auscultated anterolaterally  Heart regular rate and rhythm  Abdomen soft, +BS  Left UE elevated  in sleeve  Neuro nonfocal  Breast exam: deferred  CBG (last 3)  No results for input(s): GLUCAP in the last 72 hours.   Labs:  Lab Results  Component Value Date   WBC 8.4 04/07/2016   HGB 12.1 04/07/2016   HCT 37.5 04/07/2016   MCV 95.7 04/07/2016   PLT 214 04/07/2016   NEUTROABS 7.6 04/06/2016    _0 @  Urine Studies No results for input(s): UHGB, CRYS in the last 72 hours.  Invalid input(s): UACOL, UAPR, USPG, UPH, UTP, UGL, UKET, UBIL, UNIT, UROB, ULEU, UEPI, UWBC, URBC, UBAC, CAST, [place], [place]  Basic Metabolic Panel:  Recent Labs Lab 04/05/16 2054 04/06/16 0229 04/07/16 0301  NA 137 136 136  K 3.4* 3.5 2.8*  CL 100* 103 104  CO2 _1 GLUCOSE 140* 118* 108*  BUN 7 6 <5*  CREATININE 0.84 0.69 0.58  CALCIUM 9.3 8.5* 8.6*   GFR Estimated Creatinine  Clearance: 80.5 mL/min (by C-G formula based on SCr of 0.8 mg/dL). Liver Function Tests:  Recent Labs Lab 04/05/16 2054  AST 61*  ALT 63*  ALKPHOS 83  BILITOT 0.3  PROT 7.0  ALBUMIN 4.0   No results for input(s): LIPASE, AMYLASE in the last 168 hours. No results for input(s): AMMONIA in the last 168 hours. Coagulation profile  Recent Labs Lab 04/06/16 0229 04/06/16 1338  INR 1.05 1.09    CBC:  Recent Labs Lab 04/05/16 2054 04/06/16 0229 04/06/16 1338 04/07/16 0301  WBC 14.0* 11.9* 9.3 8.4  NEUTROABS 12.2*  --  7.6  --   HGB 13.5 12.6 11.3* 12.1  HCT 41.6 39.1 34.8* 37.5  MCV 95.9 96.3 94.8 95.7  PLT 287 238 231 214   Cardiac Enzymes: No results for input(s): CKTOTAL, CKMB, CKMBINDEX, TROPONINI in the last 168 hours. BNP: Invalid input(s): POCBNP CBG: No results for input(s): GLUCAP in the last 168 hours. D-Dimer No results for input(s): DDIMER in the last 72 hours. Hgb A1c No results for input(s): HGBA1C in the last 72 hours. Lipid Profile No results for input(s): CHOL, HDL, LDLCALC,  TRIG, CHOLHDL, LDLDIRECT in the last 72 hours. Thyroid function studies No results for input(s): TSH, T4TOTAL, T3FREE, THYROIDAB in the last 72 hours.  Invalid input(s): FREET3 Anemia work up No results for input(s): VITAMINB12, FOLATE, FERRITIN, TIBC, IRON, RETICCTPCT in the last 72 hours. Microbiology Recent Results (from the past 240 hour(s))  MRSA PCR Screening     Status: None   Collection Time: 04/06/16  8:32 PM  Result Value Ref Range Status   MRSA by PCR NEGATIVE NEGATIVE Final    Comment:        The GeneXpert MRSA Assay (FDA approved for NASAL specimens only), is one component of a comprehensive MRSA colonization surveillance program. It is not intended to diagnose MRSA infection nor to guide or monitor treatment for MRSA infections.       Studies:  Dg Chest Port 1 View  Result Date: 04/06/2016 CLINICAL DATA:  55 year old female with sepsis and  cellulitis of the left hand. No chest complaints. EXAM: PORTABLE CHEST 1 VIEW COMPARISON:  Chest CT dated 10/1913 FINDINGS: Single portable view of the chest does not demonstrate a focal consolidation. There is no pleural effusion or pneumothorax. An apparent rounded area of increased density in the left lower lung field likely represents breast implant. The cardiac silhouette is within normal limits. No acute osseous pathology identified. IMPRESSION: No acute cardiopulmonary process. Electronically Signed   By: Anner Crete M.D.   On: 04/06/2016 00:27  Dg Hand Complete Left  Result Date: 04/05/2016 CLINICAL DATA:  Left hand swelling and redness, no known injury, initial encounter EXAM: LEFT HAND - COMPLETE 3+ VIEW COMPARISON:  None. FINDINGS: Generalized soft tissue swelling is noted consistent with the given clinical history of lymphedema. No acute fracture or dislocation is noted. IMPRESSION: Generalized soft tissue swelling without acute bony abnormality. Electronically Signed   By: Inez Catalina M.D.   On: 04/05/2016 20:59   Assessment: 55 y.o.  BRCA negative [place], [place] speaker   (1) status post left lumpectomy and axillary lymph node dissection in 1998 in [place] for a stage II invasive ductal carcinoma, grade 2, treated with adjuvant chemotherapy (specific drugs not clear) and adjuvant radiation.  (2) status post left breast upper outer quadrant biopsy 10/04/2013 for a clinically mT2 N0, stage IIA invasive ductal carcinoma, grade 2, estrogen receptor 100% positive, progesterone receptor 33% positive, with an MIB-1 of 15%, and HER-2 amplified; staging studies showed no evidence of metastatic disease  (3) started on neoadjuvant carboplatin/ docetaxel/ trastuzumab/ pertuzumab 11/12/2013                       (a) completed 4 cycles as of 05/06/201, after which docetaxel was discontinued because of neuropathy symptoms                                     (b) gemcitabine was  given day 1 and day 8 with carboplatin day 1 in the final 2 cycles, completed 03/04/2014                        (c)  completed a year of trastuzumab March 2016--  Final echo 10/21/2014 shows a normal EF  (4) left mastectomy  04/12/2014 showed residual mpT1c pNX invasive ductal carcinoma, grade 2, estrogen and progesterone receptor positive, HER-2 negative, with negative margins  (5) anastrozole started 07/14/2014                       (  a) bone density 07/29/2014 shows T score - 1.2 (mild osteopenia)  (6) 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  (7) L UE cellulitis: day 2 vancomycin/ Zosyn  (a) blood cultures negative day 2   Plan: she reports symptomatic improvement. 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 . The infection comes from a gardening scratch. She is not neutropenic or systemically immunocompromised. Likely can be changed to orals after another 48 hours or so if cultures remain negative and cellulitis clinically improves; would treat a total 10 days.  She does not have an appt with me until June 2018 but I can see her in 2 weeks or so to confirm resolution of the infection if that would be helpful  Appreciate your help to this patient!   [name], MD 04/07/2016  7:34 AM Medical Oncology and Hematology [facility] [address] [place], [place] [address] Tel. [phone]    Fax. [phone]

## 2016-04-07 NOTE — Progress Notes (Signed)
PROGRESS NOTE    Kelsey Mclaughlin  [id number] DOB: 12/03/1960 DOA: 04/05/2016 PCP: [name], MD   Brief Narrative:  Mrs Kelsey Mclaughlin is a pleasant 55 year old female with a history of breast cancer, initially diagnosed at the age of 8 in [place], pathology showing invasive carcinoma grade 2 stage II, status post lumpectomy with lymph node dissection, status post chemotherapy, found to have recurrence of disease in 2014 and has been under the care of Dr. Jana Hakim of medical oncology. She is currently ananastrozole. Her last appointment with Dr. Jana Hakim was on 02/24/2016. 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 She presented to the emergency department overnight with complaints of left had swelling, pain, erythema. In the emergency department initial lab work revealed white count of 14,000 with lactate of 3.11 and had a temperature of 101. She was started on broad-spectrum IV antimicrobial therapy with vancomycin and Zosyn. The following day lab work showing upward trend in lactate to 4.0.    Assessment & Plan:   Principal Problem:   Cellulitis of left hand Active Problems:   Breast cancer of upper-outer quadrant of left female breast (HCC)   Lymph edema   Hypokalemia   Sepsis (HCC)   Chronic diastolic (congestive) heart failure (HCC)  1.  Cellulitis of left hand -Mrs Kelsey Mclaughlin admitted secondary to significant left hand pain, swelling and erythema. -She reported doing a lot of work in her garden and thinks it's possible she may have had some local trauma or perhaps bitten by an insect.  -MRI has demonstrated no abscess and Just findings suggesting cellulitis -Case discussed with orthopedic surgeon (Dr. Lorin Mercy) who recommended continued treatment with IV antibiotics and hand elevation.  -Patient is clinically improving and her sepsis features present on admission essentially resolved -After another  24/48 hours of IV antibiotics. Discharge on oral regimen with anticipation of a total of 10 days treatment.  2.  Sepsis: Due to left hand cellulitis -Present on admission, evidenced by a lactic acid of 4.0 with presenting white count of 14,000, temperature of 101, heart rate of 112, with source of infection likely coming from left hand cellulitis -Patient started on sepsis protocol and is currently on IV antibiotics -Lactic acid is now Essentially back to normal; with normal WBCs and no febrile. -IV fluid rate has been reduce -Will continue IV antibiotics for another 24-48 hours as per recommendation by orthopedic service. -Sepsis features essentially resolved by now.  3.  History of breast cancer -She has a history of left breast cancer diagnosed in the 1990s with recurrence in 2014, currently sees Dr.Magrinat and is on anastrozole therapy -Continue outpatient follow-up with her oncologist  4.  History of chronic diastolic congestive heart failure -Last transthoracic echocardiogram performed on 10/21/2014 that revealed grade 2 diastolic dysfunction with preserved ejection fraction of 60-65% -Will follow daily weights and is treated intake and output -At this moment since the patient is no longer toxic, will reduce IV fluids resuscitation -Continue low sodium diet.  DVT prophylaxis: Lovenox Code Status: Full code Family Communication:  Disposition Plan: Dr. Lorin Mercy of orthopedic surgery consulted, recommended to continue IV antibiotics for another 24/48 hours,  no abscess appreciated on MRI of left hand. Patient is clinically improving.  Consultants:   Dr. Lorin Mercy  Procedures:     Antimicrobials:   Vancomycin started on 04/06/2016  Zosyn started on 04/06/2016   Subjective: Patient reports improvement in the swelling of her hand, is a slightly  less tender. No fever, no chest pain, no shortness of breath, no nausea, vomiting or diarrhea.  Objective: Vitals:   04/06/16 1824  04/06/16 2037 04/07/16 0459 04/07/16 0951  BP: 130/60 (!) 141/80 (!) 145/85 (!) 146/81  Pulse: 76 67 76 71  Resp: 20 18 16 18   Temp: 99.6 F (37.6 C) 98.3 F (36.8 C) 99.1 F (37.3 C) 98.7 F (37.1 C)  TempSrc: Oral Oral Oral Oral  SpO2: 99% 96% 99% 97%  Weight:      Height:        Intake/Output Summary (Last 24 hours) at 04/07/16 2009 Last data filed at 04/07/16 1700  Gross per 24 hour  Intake              840 ml  Output                0 ml  Net              840 ml   Filed Weights   04/05/16 1955 04/06/16 0300  Weight: 80 kg (176 lb 4.8 oz) 79.8 kg (176 lb)    Examination:  General exam: Appears calm and comfortable; no CP, no SOB and afebrile. Respiratory system: Clear to auscultation. Respiratory effort normal. Cardiovascular system: S1 & S2 heard, RRR. No JVD, murmurs, rubs, gallops or clicks. No pedal edema. Gastrointestinal system: Abdomen is nondistended, soft and nontender. No organomegaly or masses felt. Normal bowel sounds heard. Central nervous system: Alert and oriented. No focal neurological deficits. Extremities: Symmetric 5 x 5 power. Skin: There is improvement in swelling of her left hand. Still tender and with erythematous changes. I do not appreciate palpable masses, purulence, or any other drainage. Psychiatry: Judgement and insight appear normal. Mood & affect appropriate.     Data Reviewed: I have personally reviewed following labs and imaging studies  CBC:  Recent Labs Lab 04/05/16 2054 04/06/16 0229 04/06/16 1338 04/07/16 0301  WBC 14.0* 11.9* 9.3 8.4  NEUTROABS 12.2*  --  7.6  --   HGB 13.5 12.6 11.3* 12.1  HCT 41.6 39.1 34.8* 37.5  MCV 95.9 96.3 94.8 95.7  PLT 287 238 231 [id number]   Basic Metabolic Panel:  Recent Labs Lab 04/05/16 2054 04/06/16 0229 04/07/16 0301  NA 137 136 136  K 3.4* 3.5 2.8*  CL 100* 103 104  CO2 24 25 26   GLUCOSE 140* 118* 108*  BUN 7 6 <5*  CREATININE 0.84 0.69 0.58  CALCIUM 9.3 8.5* 8.6*    GFR: Estimated Creatinine Clearance: 80.5 mL/min (by C-G formula based on SCr of 0.8 mg/dL).   Liver Function Tests:  Recent Labs Lab 04/05/16 2054  AST 61*  ALT 63*  ALKPHOS 83  BILITOT 0.3  PROT 7.0  ALBUMIN 4.0   Coagulation Profile:  Recent Labs Lab 04/06/16 0229 04/06/16 1338  INR 1.05 1.09   Sepsis Labs:  Recent Labs Lab 04/05/16 2226 04/06/16 0229 04/06/16 0234 04/06/16 0549 04/06/16 1338  PROCALCITON  --  0.12  --   --  0.19  LATICACIDVEN 3.11*  --  3.0* 4.0* 2.1*    Recent Results (from the past 240 hour(s))  Urine culture     Status: Abnormal   Collection Time: 04/05/16  8:40 PM  Result Value Ref Range Status   Specimen Description URINE, CLEAN CATCH  Final   Special Requests NONE  Final   Culture MULTIPLE SPECIES PRESENT, SUGGEST RECOLLECTION (A)  Final   Report Status 04/07/2016 FINAL  Final  Culture, blood (  Routine X 2) w Reflex to ID Panel     Status: None (Preliminary result)   Collection Time: 04/06/16  1:49 PM  Result Value Ref Range Status   Specimen Description BLOOD RIGHT HAND  Final   Special Requests BOTTLES DRAWN AEROBIC AND ANAEROBIC 5CC   Final   Culture NO GROWTH 1 DAY  Final   Report Status PENDING  Incomplete  Culture, blood (Routine X 2) w Reflex to ID Panel     Status: None (Preliminary result)   Collection Time: 04/06/16  2:11 PM  Result Value Ref Range Status   Specimen Description BLOOD RIGHT HAND  Final   Special Requests BOTTLES DRAWN AEROBIC AND ANAEROBIC 5CC   Final   Culture NO GROWTH 1 DAY  Final   Report Status PENDING  Incomplete  MRSA PCR Screening     Status: None   Collection Time: 04/06/16  8:32 PM  Result Value Ref Range Status   MRSA by PCR NEGATIVE NEGATIVE Final    Comment:        The GeneXpert MRSA Assay (FDA approved for NASAL specimens only), is one component of a comprehensive MRSA colonization surveillance program. It is not intended to diagnose MRSA infection nor to guide or monitor  treatment for MRSA infections.      Radiology Studies: Mr Hand Left W Wo Contrast  Result Date: 04/07/2016 CLINICAL DATA:  Left hand pain hand, swelling with redness worsening today. Fever. EXAM: MRI OF THE LEFT HAND WITHOUT AND WITH CONTRAST TECHNIQUE: Multiplanar, multisequence MR imaging was performed both before and after administration of intravenous contrast. CONTRAST:  51mL MULTIHANCE GADOBENATE DIMEGLUMINE 529 MG/ML IV SOLN COMPARISON:  Radiographs 04/05/2016 FINDINGS: Examination is quite limited due to patient motion. Diffuse and fairly marked subcutaneous soft tissue swelling/ edema/ fluid suggesting cellulitis. No discrete drainable soft tissue abscess. No findings for myofasciitis or pyomyositis. Extensive fluid surrounding the extensor tendon suggesting sympathetic versus septic tenosynovitis. No findings for septic arthritis or osteomyelitis. IMPRESSION: Diffuse cellulitis and sympathetic or septic extensor tenosynovitis but no findings for pyomyositis, septic arthritis or osteomyelitis. Electronically Signed   By: Marijo Sanes M.D.   On: 04/07/2016 08:15  Dg Chest Port 1 View  Result Date: 04/06/2016 CLINICAL DATA:  55 year old female with sepsis and cellulitis of the left hand. No chest complaints. EXAM: PORTABLE CHEST 1 VIEW COMPARISON:  Chest CT dated 10/1913 FINDINGS: Single portable view of the chest does not demonstrate a focal consolidation. There is no pleural effusion or pneumothorax. An apparent rounded area of increased density in the left lower lung field likely represents breast implant. The cardiac silhouette is within normal limits. No acute osseous pathology identified. IMPRESSION: No acute cardiopulmonary process. Electronically Signed   By: Anner Crete M.D.   On: 04/06/2016 00:27  Dg Hand Complete Left  Result Date: 04/05/2016 CLINICAL DATA:  Left hand swelling and redness, no known injury, initial encounter EXAM: LEFT HAND - COMPLETE 3+ VIEW COMPARISON:  None.  FINDINGS: Generalized soft tissue swelling is noted consistent with the given clinical history of lymphedema. No acute fracture or dislocation is noted. IMPRESSION: Generalized soft tissue swelling without acute bony abnormality. Electronically Signed   By: Inez Catalina M.D.   On: 04/05/2016 20:59   Scheduled Meds: . anastrozole  1 mg Oral Daily  . desvenlafaxine  100 mg Oral Daily  . enoxaparin (LOVENOX) injection  40 mg Subcutaneous Daily  . piperacillin-tazobactam (ZOSYN)  IV  3.375 g Intravenous Q8H  . sodium chloride flush  3 mL Intravenous Q12H  . vancomycin  1,000 mg Intravenous Q12H   Continuous Infusions: . sodium chloride 50 mL/hr at 04/07/16 1026     LOS: 1 day    Time spent: 25 minutes    Barton Dubois, MD [facility] Pager [phone]  If 7PM-7AM, please contact night-coverage [email or web address] Password [facility] 04/07/2016, 8:09 PM

## 2016-04-08 DIAGNOSIS — F329 Major depressive disorder, single episode, unspecified: Secondary | ICD-10-CM

## 2016-04-08 DIAGNOSIS — F32A Depression, unspecified: Secondary | ICD-10-CM

## 2016-04-08 LAB — BASIC METABOLIC PANEL
ANION GAP: 7 (ref 5–15)
BUN: <5 mg/dL — ABNORMAL LOW (ref 6–20)
CALCIUM: 9 mg/dL (ref 8.9–10.3)
CO2: 26 mmol/L (ref 22–32)
Chloride: 107 mmol/L (ref 101–111)
Creatinine, Ser: 0.53 mg/dL (ref 0.44–1.00)
Glucose, Bld: 109 mg/dL — ABNORMAL HIGH (ref 65–99)
Potassium: 3.8 mmol/L (ref 3.5–5.1)
Sodium: 140 mmol/L (ref 135–145)

## 2016-04-08 LAB — CBC
HEMATOCRIT: 35.8 % — AB (ref 36.0–46.0)
HEMOGLOBIN: 11.4 g/dL — AB (ref 12.0–15.0)
MCH: 30.2 pg (ref 26.0–34.0)
MCHC: 31.8 g/dL (ref 30.0–36.0)
MCV: 95 fL (ref 78.0–100.0)
Platelets: 228 10*3/uL (ref 150–400)
RBC: 3.77 MIL/uL — AB (ref 3.87–5.11)
RDW: 13.1 % (ref 11.5–15.5)
WBC: 5.9 10*3/uL (ref 4.0–10.5)

## 2016-04-08 LAB — LACTIC ACID, PLASMA: LACTIC ACID, VENOUS: 1.3 mmol/L (ref 0.5–1.9)

## 2016-04-08 MED ORDER — ACETAMINOPHEN 325 MG PO TABS: 650.0000 mg | ORAL_TABLET | Freq: Four times a day (QID) | ORAL | 0 refills | Status: DC | PRN

## 2016-04-08 MED ORDER — AMOXICILLIN-POT CLAVULANATE 500-125 MG PO TABS: 1.0000 | ORAL_TABLET | Freq: Two times a day (BID) | ORAL | 0 refills | Status: DC

## 2016-04-08 MED ORDER — DOXYCYCLINE HYCLATE 100 MG PO TABS
100.0000 mg | ORAL_TABLET | Freq: Two times a day (BID) | ORAL | 0 refills | Status: DC
Start: 2016-04-08 — End: 2017-03-29

## 2016-04-08 MED ORDER — SACCHAROMYCES BOULARDII 250 MG PO CAPS: 250.0000 mg | ORAL_CAPSULE | Freq: Two times a day (BID) | ORAL | 0 refills | Status: DC

## 2016-04-08 NOTE — Discharge Summary (Signed)
Physician Discharge Summary  Kelsey Mclaughlin [id number] DOB: 07-12-1961 DOA: 04/05/2016  PCP: Kelsey Cruel, MD  Admit date: 04/05/2016 Discharge date: 04/08/2016  Time spent: 30 minutes  Recommendations for Outpatient Follow-up:  Repeat CBC to follow WBC's trend/satbility  Repeat BMET to follow electrolytes and renal function  Reassess Left hand, to ensure resolution of cellulitis   Discharge Diagnoses:  Principal Problem:   Cellulitis of left hand Active Problems:   Breast cancer of upper-outer quadrant of left female breast ([place])   Lymph edema   Hypokalemia   Sepsis ([place])   Chronic diastolic (congestive) heart failure ([place])   Depression   Discharge Condition: stable and improved. No fever, normal HR and blood pressure; patient with normal RR and normal WBC's. Discharge home on oral antibiotics and with plans to follow with PP in 10 days.  Diet recommendation: heart healthy diet   Filed Weights   04/05/16 1955 04/06/16 0300  Weight: 80 kg (176 lb 4.8 oz) 79.8 kg (176 lb)    History of present illness:  As per Dr. Blaine Mclaughlin H&P written on 04/05/16 55 y.o. female with medical history significant of breast cancer (s/p of L mastectomy), lymphedema in left arm,  bronchitis, depression, stroke, who presents with left hand swelling, redness and pain.  Pt reported that she has chronic left arm lymphedema in left arm after L mastectomy and lymph node removal. She had recurrent infection in left hand in the past. She states that she started to have left hand pain, swelling and redness since this AM. The pain was constant, 10 out of 10 in severity. She also has fever and chills. She has nausea and vomited several times. No diarrhea or abdominal pain. Patient does not have chest pain, shortness breath, cough, symptoms of UTI or unilateral weakness.  Hospital Course:  1.  Cellulitis of left hand -Kelsey Mclaughlin admitted secondary to significant left hand pain, swelling and  erythema. -She reported doing a lot of work in her garden and thinks it's possible she may have had some local trauma or perhaps bitten by an insect.  -MRI has demonstrated no abscess and Just findings suggesting cellulitis -Case discussed with orthopedic surgeon (Dr. Lorin Mclaughlin) who recommended continued treatment with IV antibiotics, hand elevation and subsequent transition to oral regimen.  -Patient has clinically improved and her sepsis features resolved at discharge -stable and improved. Discharge home on oral antibiotic using doxycycline and augmentin   2.  Sepsis: Due to left hand cellulitis -Present on admission, evidenced by a lactic acid of 4.0 with presenting white count of 14,000, temperature of 101, heart rate of 112, with source of infection likely coming from left hand cellulitis -Patient started on sepsis protocol and after 3 days of IV antibiotics (using vancomycin and zosyn); patient was discharge on doxycycline and augmentin; plan is to treat for another 8 days to complete therapy. -Sepsis features resolved at discharge (normal lactic acid, hemodynamically stable, no fever and normal WBC's).  3.  History of breast cancer -She has a history of left breast cancer diagnosed in the 1990s with recurrence in 2014, currently sees Kelsey Mclaughlin and is on anastrozole therapy -Continue outpatient follow-up with her oncologist  4.  History of chronic diastolic congestive heart failure -Last transthoracic echocardiogram performed on 10/21/2014 that revealed grade 2 diastolic dysfunction with preserved ejection fraction of 60-65% -Will follow daily weights and is treated intake and output -Advise to Continue low sodium diet and to check weight on daily basis  5.  Depression -continue  home medication -no SI or hallucinations -mood remains stable  6.  Hypokalemia -repleted and WNL at discharge  Procedures: See below for x-ray reports   Consultations:  Orthopedic  service  Oncologist notified of admission (Dr. Jana Mclaughlin)   Discharge Exam: Vitals:   04/07/16 2111 04/08/16 0511  BP: (!) 152/84 136/73  Pulse: 77 64  Resp: 18 18  Temp: 98.6 F (37 C) 98.3 F (36.8 C)   General exam: Appears calm and comfortable; no CP, no SOB and afebrile. Respiratory system: Clear to auscultation. Respiratory effort normal. Cardiovascular system: S1 & S2 heard, RRR. No JVD, murmurs, rubs, gallops or clicks. No pedal edema. Gastrointestinal system: Abdomen is nondistended, soft and nontender. No organomegaly or masses felt. Normal bowel sounds heard. Central nervous system: Alert and oriented. No focal neurological deficits. Extremities: Symmetric 5 x 5 power. Skin: There is significant improvement in swelling of her left hand. Minimal tenderness and with just mild erythematous changes. I do not appreciate palpable masses, purulence, or any other drainage. Improvement in range of motion also appreciated. Psychiatry: Judgement and insight appear normal. Mood & affect appropriate.     Discharge Instructions   Discharge Instructions    Call MD for:  persistant nausea and vomiting    Complete by:  As directed   Call MD for:  redness, tenderness, or signs of infection (pain, swelling, redness, odor or green/yellow discharge around incision site)    Complete by:  As directed   Call MD for:  severe uncontrolled pain    Complete by:  As directed   Diet - low sodium heart healthy    Complete by:  As directed   Discharge instructions    Complete by:  As directed   Arrange follow up with PCP in 10 days Keep left hand elevated as much as possible Apply cold compresses 2-3 times a day, to help with swelling/inflammation   Use ibuprofen as discussed to help with swelling and pain Keep yourself well hydrated   Increase activity slowly    Complete by:  As directed     Current Discharge Medication List    START taking these medications   Details  acetaminophen  (TYLENOL) 325 MG tablet Take 2 tablets (650 mg total) by mouth every 6 (six) hours as needed for mild pain (or Fever >/= 101). Qty: 40 tablet, Refills: 0    amoxicillin-clavulanate (AUGMENTIN) 500-125 MG tablet Take 1 tablet (500 mg total) by mouth 2 (two) times daily. Qty: 16 tablet, Refills: 0    doxycycline (VIBRA-TABS) 100 MG tablet Take 1 tablet (100 mg total) by mouth 2 (two) times daily. Qty: 20 tablet, Refills: 0    saccharomyces boulardii (FLORASTOR) 250 MG capsule Take 1 capsule (250 mg total) by mouth 2 (two) times daily. Qty: 40 capsule, Refills: 0      CONTINUE these medications which have NOT CHANGED   Details  anastrozole (ARIMIDEX) 1 MG tablet TAKE 1 TABLET (1 MG TOTAL) BY MOUTH DAILY. Qty: 90 tablet, Refills: 3    desvenlafaxine (PRISTIQ) 50 MG 24 hr tablet Take 100 mg by mouth daily.        Allergies  Allergen Reactions  . Adhesive [Tape] Rash    Band-aids   Follow-up Information    MAGRINAT,GUSTAV C, MD Follow up in 10 day(s).   Specialty:  Oncology Contact information: [place] [address] [phone]           The results of significant diagnostics from this hospitalization (  including imaging, microbiology, ancillary and laboratory) are listed below for reference.    Significant Diagnostic Studies: Mr Hand Left W Wo Contrast  Result Date: 04/07/2016 CLINICAL DATA:  Left hand pain hand, swelling with redness worsening today. Fever. EXAM: MRI OF THE LEFT HAND WITHOUT AND WITH CONTRAST TECHNIQUE: Multiplanar, multisequence MR imaging was performed both before and after administration of intravenous contrast. CONTRAST:  55mL MULTIHANCE GADOBENATE DIMEGLUMINE 529 MG/ML IV SOLN COMPARISON:  Radiographs 04/05/2016 FINDINGS: Examination is quite limited due to patient motion. Diffuse and fairly marked subcutaneous soft tissue swelling/ edema/ fluid suggesting cellulitis. No discrete drainable soft tissue abscess. No findings for  myofasciitis or pyomyositis. Extensive fluid surrounding the extensor tendon suggesting sympathetic versus septic tenosynovitis. No findings for septic arthritis or osteomyelitis. IMPRESSION: Diffuse cellulitis and sympathetic or septic extensor tenosynovitis but no findings for pyomyositis, septic arthritis or osteomyelitis. Electronically Signed   By: Marijo Sanes M.D.   On: 04/07/2016 08:15  Dg Chest Port 1 View  Result Date: 04/06/2016 CLINICAL DATA:  55 year old female with sepsis and cellulitis of the left hand. No chest complaints. EXAM: PORTABLE CHEST 1 VIEW COMPARISON:  Chest CT dated 10/1913 FINDINGS: Single portable view of the chest does not demonstrate a focal consolidation. There is no pleural effusion or pneumothorax. An apparent rounded area of increased density in the left lower lung field likely represents breast implant. The cardiac silhouette is within normal limits. No acute osseous pathology identified. IMPRESSION: No acute cardiopulmonary process. Electronically Signed   By: Anner Crete M.D.   On: 04/06/2016 00:27  Dg Hand Complete Left  Result Date: 04/05/2016 CLINICAL DATA:  Left hand swelling and redness, no known injury, initial encounter EXAM: LEFT HAND - COMPLETE 3+ VIEW COMPARISON:  None. FINDINGS: Generalized soft tissue swelling is noted consistent with the given clinical history of lymphedema. No acute fracture or dislocation is noted. IMPRESSION: Generalized soft tissue swelling without acute bony abnormality. Electronically Signed   By: Inez Catalina M.D.   On: 04/05/2016 20:59   Microbiology: Recent Results (from the past 240 hour(s))  Urine culture     Status: Abnormal   Collection Time: 04/05/16  8:40 PM  Result Value Ref Range Status   Specimen Description URINE, CLEAN CATCH  Final   Special Requests NONE  Final   Culture MULTIPLE SPECIES PRESENT, SUGGEST RECOLLECTION (A)  Final   Report Status 04/07/2016 FINAL  Final  Culture, blood (Routine X 2) w  Reflex to ID Panel     Status: None (Preliminary result)   Collection Time: 04/06/16  1:49 PM  Result Value Ref Range Status   Specimen Description BLOOD RIGHT HAND  Final   Special Requests BOTTLES DRAWN AEROBIC AND ANAEROBIC 5CC   Final   Culture NO GROWTH 1 DAY  Final   Report Status PENDING  Incomplete  Culture, blood (Routine X 2) w Reflex to ID Panel     Status: None (Preliminary result)   Collection Time: 04/06/16  2:11 PM  Result Value Ref Range Status   Specimen Description BLOOD RIGHT HAND  Final   Special Requests BOTTLES DRAWN AEROBIC AND ANAEROBIC 5CC   Final   Culture NO GROWTH 1 DAY  Final   Report Status PENDING  Incomplete  MRSA PCR Screening     Status: None   Collection Time: 04/06/16  8:32 PM  Result Value Ref Range Status   MRSA by PCR NEGATIVE NEGATIVE Final    Comment:        The  GeneXpert MRSA Assay (FDA approved for NASAL specimens only), is one component of a comprehensive MRSA colonization surveillance program. It is not intended to diagnose MRSA infection nor to guide or monitor treatment for MRSA infections.      Labs: Basic Metabolic Panel:  Recent Labs Lab 04/05/16 2054 04/06/16 0229 04/07/16 0301 04/08/16 0213  NA 137 136 136 140  K 3.4* 3.5 2.8* 3.8  CL 100* 103 104 107  CO2 24 25 26 26   GLUCOSE 140* 118* 108* 109*  BUN 7 6 <5* <5*  CREATININE 0.84 0.69 0.58 0.53  CALCIUM 9.3 8.5* 8.6* 9.0   Liver Function Tests:  Recent Labs Lab 04/05/16 2054  AST 61*  ALT 63*  ALKPHOS 83  BILITOT 0.3  PROT 7.0  ALBUMIN 4.0   CBC:  Recent Labs Lab 04/05/16 2054 04/06/16 0229 04/06/16 1338 04/07/16 0301 04/08/16 0213  WBC 14.0* 11.9* 9.3 8.4 5.9  NEUTROABS 12.2*  --  7.6  --   --   HGB 13.5 12.6 11.3* 12.1 11.4*  HCT 41.6 39.1 34.8* 37.5 35.8*  MCV 95.9 96.3 94.8 95.7 95.0  PLT 287 238 231 214 228   BNP (last 3 results)  Recent Labs  04/06/16 0229  BNP 61.6    Signed:  Barton Dubois MD.  [facility] 04/08/2016, 2:01 PM

## 2016-04-08 NOTE — Plan of Care (Signed)
Problem: Pain Managment: Goal: General experience of comfort will improve Outcome: Progressing Pt. Not requesting pain med often; states that (L) hand/arm is better with decrease swelling.

## 2016-04-11 LAB — CULTURE, BLOOD (ROUTINE X 2)
CULTURE: NO GROWTH
Culture: NO GROWTH

## 2016-09-02 DIAGNOSIS — Z01419 Encounter for gynecological examination (general) (routine) without abnormal findings: Secondary | ICD-10-CM | POA: Diagnosis not present

## 2016-09-20 ENCOUNTER — Other Ambulatory Visit: Payer: Self-pay | Admitting: Oncology

## 2016-09-24 ENCOUNTER — Other Ambulatory Visit: Payer: Self-pay | Admitting: Nurse Practitioner

## 2016-11-19 ENCOUNTER — Other Ambulatory Visit: Payer: Self-pay | Admitting: *Deleted

## 2016-11-23 ENCOUNTER — Other Ambulatory Visit: Payer: Self-pay | Admitting: Oncology

## 2016-11-23 DIAGNOSIS — Z1231 Encounter for screening mammogram for malignant neoplasm of breast: Secondary | ICD-10-CM

## 2016-11-29 ENCOUNTER — Other Ambulatory Visit: Payer: Self-pay | Admitting: Emergency Medicine

## 2016-11-29 MED ORDER — ANASTROZOLE 1 MG PO TABS: ORAL_TABLET | ORAL | 3 refills | Status: DC

## 2016-12-10 ENCOUNTER — Ambulatory Visit
Admission: RE | Admit: 2016-12-10 | Discharge: 2016-12-10 | Disposition: A | Discharge status: Home / Self Care | Payer: Federal, State, Local not specified - PPO | Source: Ambulatory Visit | Attending: Oncology | Admitting: Oncology

## 2016-12-10 DIAGNOSIS — Z1231 Encounter for screening mammogram for malignant neoplasm of breast: Secondary | ICD-10-CM | POA: Diagnosis not present

## 2016-12-15 DIAGNOSIS — C50919 Malignant neoplasm of unspecified site of unspecified female breast: Secondary | ICD-10-CM | POA: Diagnosis not present

## 2016-12-15 DIAGNOSIS — Z1211 Encounter for screening for malignant neoplasm of colon: Secondary | ICD-10-CM | POA: Diagnosis not present

## 2016-12-15 DIAGNOSIS — R220 Localized swelling, mass and lump, head: Secondary | ICD-10-CM | POA: Diagnosis not present

## 2016-12-15 DIAGNOSIS — I10 Essential (primary) hypertension: Secondary | ICD-10-CM | POA: Diagnosis not present

## 2016-12-20 DIAGNOSIS — Z923 Personal history of irradiation: Secondary | ICD-10-CM | POA: Diagnosis not present

## 2016-12-20 DIAGNOSIS — Z853 Personal history of malignant neoplasm of breast: Secondary | ICD-10-CM | POA: Diagnosis not present

## 2016-12-20 DIAGNOSIS — Z9012 Acquired absence of left breast and nipple: Secondary | ICD-10-CM | POA: Diagnosis not present

## 2016-12-30 DIAGNOSIS — R748 Abnormal levels of other serum enzymes: Secondary | ICD-10-CM | POA: Diagnosis not present

## 2016-12-30 DIAGNOSIS — Z01818 Encounter for other preprocedural examination: Secondary | ICD-10-CM | POA: Diagnosis not present

## 2016-12-30 DIAGNOSIS — Z1211 Encounter for screening for malignant neoplasm of colon: Secondary | ICD-10-CM | POA: Diagnosis not present

## 2017-01-12 DIAGNOSIS — Z1211 Encounter for screening for malignant neoplasm of colon: Secondary | ICD-10-CM | POA: Diagnosis not present

## 2017-01-12 DIAGNOSIS — D126 Benign neoplasm of colon, unspecified: Secondary | ICD-10-CM | POA: Diagnosis not present

## 2017-01-12 DIAGNOSIS — K6289 Other specified diseases of anus and rectum: Secondary | ICD-10-CM | POA: Diagnosis not present

## 2017-01-17 DIAGNOSIS — F3342 Major depressive disorder, recurrent, in full remission: Secondary | ICD-10-CM | POA: Diagnosis not present

## 2017-01-18 DIAGNOSIS — D126 Benign neoplasm of colon, unspecified: Secondary | ICD-10-CM | POA: Diagnosis not present

## 2017-01-18 DIAGNOSIS — Z1211 Encounter for screening for malignant neoplasm of colon: Secondary | ICD-10-CM | POA: Diagnosis not present

## 2017-01-21 DIAGNOSIS — Z8349 Family history of other endocrine, nutritional and metabolic diseases: Secondary | ICD-10-CM | POA: Diagnosis not present

## 2017-01-21 DIAGNOSIS — R5383 Other fatigue: Secondary | ICD-10-CM | POA: Diagnosis not present

## 2017-02-16 ENCOUNTER — Other Ambulatory Visit (HOSPITAL_BASED_OUTPATIENT_CLINIC_OR_DEPARTMENT_OTHER): Payer: Federal, State, Local not specified - PPO

## 2017-02-16 DIAGNOSIS — C50912 Malignant neoplasm of unspecified site of left female breast: Secondary | ICD-10-CM

## 2017-02-16 DIAGNOSIS — C50412 Malignant neoplasm of upper-outer quadrant of left female breast: Secondary | ICD-10-CM | POA: Diagnosis not present

## 2017-02-16 LAB — COMPREHENSIVE METABOLIC PANEL
ALBUMIN: 4.1 g/dL (ref 3.5–5.0)
ALK PHOS: 107 U/L (ref 40–150)
ALT: 58 U/L — AB (ref 0–55)
ANION GAP: 9 meq/L (ref 3–11)
AST: 34 U/L (ref 5–34)
BUN: 12.8 mg/dL (ref 7.0–26.0)
CALCIUM: 9.7 mg/dL (ref 8.4–10.4)
CHLORIDE: 105 meq/L (ref 98–109)
CO2: 26 mEq/L (ref 22–29)
Creatinine: 0.8 mg/dL (ref 0.6–1.1)
EGFR: 87 mL/min/{1.73_m2} — ABNORMAL LOW (ref >90)
Glucose: 113 mg/dl (ref 70–140)
Potassium: 3.7 mEq/L (ref 3.5–5.1)
Sodium: 140 mEq/L (ref 136–145)
Total Bilirubin: 0.28 mg/dL (ref 0.20–1.20)
Total Protein: 6.9 g/dL (ref 6.4–8.3)

## 2017-02-16 LAB — CBC WITH DIFFERENTIAL/PLATELET
BASO%: 0.7 % (ref 0.0–2.0)
BASOS ABS: 0 10*3/uL (ref 0.0–0.1)
EOS%: 4.2 % (ref 0.0–7.0)
Eosinophils Absolute: 0.3 10*3/uL (ref 0.0–0.5)
HEMATOCRIT: 40 % (ref 34.8–46.6)
HEMOGLOBIN: 13.1 g/dL (ref 11.6–15.9)
LYMPH#: 1.6 10*3/uL (ref 0.9–3.3)
LYMPH%: 26.1 % (ref 14.0–49.7)
MCH: 30.7 pg (ref 25.1–34.0)
MCHC: 32.8 g/dL (ref 31.5–36.0)
MCV: 93.7 fL (ref 79.5–101.0)
MONO#: 0.5 10*3/uL (ref 0.1–0.9)
MONO%: 7.9 % (ref 0.0–14.0)
NEUT#: 3.6 10*3/uL (ref 1.5–6.5)
NEUT%: 61.1 % (ref 38.4–76.8)
PLATELETS: 262 10*3/uL (ref 145–400)
RBC: 4.27 10*6/uL (ref 3.70–5.45)
RDW: 12.3 % (ref 11.2–14.5)
WBC: 6 10*3/uL (ref 3.9–10.3)

## 2017-02-21 ENCOUNTER — Telehealth: Payer: Self-pay | Admitting: Oncology

## 2017-02-21 NOTE — Telephone Encounter (Signed)
Spoke with patient and spouse re new appointment for 6/25 - f/u 6/13 cxd due to [facility].

## 2017-02-23 ENCOUNTER — Ambulatory Visit: Payer: Federal, State, Local not specified - PPO | Admitting: Oncology

## 2017-03-07 ENCOUNTER — Ambulatory Visit: Payer: Federal, State, Local not specified - PPO | Admitting: Oncology

## 2017-03-07 ENCOUNTER — Telehealth: Payer: Self-pay | Admitting: Oncology

## 2017-03-07 NOTE — Telephone Encounter (Signed)
Pt called to r/s 6/27 appt to 7/17 at 0945 per sch msg

## 2017-03-29 ENCOUNTER — Ambulatory Visit (HOSPITAL_BASED_OUTPATIENT_CLINIC_OR_DEPARTMENT_OTHER): Payer: Federal, State, Local not specified - PPO | Admitting: Oncology

## 2017-03-29 VITALS — BP 183/83 | HR 57 | Temp 98.6°F | Resp 20 | Ht 63.0 in | Wt 164.4 lb

## 2017-03-29 DIAGNOSIS — Z170 Estrogen receptor positive status [ER+]: Secondary | ICD-10-CM | POA: Diagnosis not present

## 2017-03-29 DIAGNOSIS — G620 Drug-induced polyneuropathy: Secondary | ICD-10-CM

## 2017-03-29 DIAGNOSIS — C50412 Malignant neoplasm of upper-outer quadrant of left female breast: Secondary | ICD-10-CM | POA: Diagnosis not present

## 2017-03-29 DIAGNOSIS — I10 Essential (primary) hypertension: Secondary | ICD-10-CM | POA: Diagnosis not present

## 2017-03-29 DIAGNOSIS — C50912 Malignant neoplasm of unspecified site of left female breast: Secondary | ICD-10-CM

## 2017-03-29 DIAGNOSIS — I890 Lymphedema, not elsewhere classified: Secondary | ICD-10-CM

## 2017-03-29 DIAGNOSIS — M8580 Other specified disorders of bone density and structure, unspecified site: Secondary | ICD-10-CM | POA: Diagnosis not present

## 2017-03-29 NOTE — Progress Notes (Signed)
[place]  [phone] [phone]     ID: Kelsey Mclaughlin OB: [birth year]  MR#: [id number]  KPT#:[id number]  PCP: [name], MD GYN:  Jac Canavan MD SU: Donnie Mesa MD OTHER MD: Irene Limbo MD, Gwendolyn Grant MD, Chucky May MD  CHIEF COMPLAINT: Breast cancer, recurrent, estrogen receptor positive  CURRENT TREATMENT:  anastrozole  BREAST CANCER HISTORY: From doctor [name]'s intake note 17/00/1749:  "Kelsey Mclaughlin is a 56 y.o. female. In 1998 at the age of 71 was diagnosed with left breast cancer in [place]. This was an invasive carcinoma grade 2 stage II. At that time she underwent a lumpectomy with excellent lymph node dissection. She received 6 months of chemotherapy. [name] She also had radiation therapy to the left breast. 2011 patient noticed a lump in the outside of her left breast. She had ultrasound workup performed and was told it was negative no biopsies were performed. General 2014 after patient moved to the [place] she had a mammogram performed this revealed a possible mass in the outer quadrant of the left breast. Ultrasound showed it to be 1.7 cm. MRI of the breasts performed on January 30 revealed the mass to be 1.5 x 2.2 x 1.5 cm. Because of this she also had a biopsy performed on 10/04/2013. The biopsy revealed [[id number]) an invasive ductal carcinoma with ductal carcinoma in situ grade 2. Prognostic panel was positive for estrogen receptor 100% megestrol receptor +33% proliferation marker Ki-67 15% and the tumor was HER-2/neu positive" [with a signals ratio of 3.1 and number per cell 6.3]  The patient's subsequent history is as detailed below  INTERVAL HISTORY: Kelsey Mclaughlin returns today for follow-up and treatment of her estrogen receptor positive breast cancer accompanied by her spouse [name]. [name] "feels good". She is tolerating the  anastrozole well, with no significant hot flashes or vaginal dryness problems. The obtain it at a good price.  REVIEW OF SYSTEMS: [name] "feels good" in general. She still has peripheral neuropathy in her fingers and toes, but interestingly this is more like a very brief symptom and does not linger. It doesn't seem to be related to whether or not she is active or other time of day. She also has pain in her knees and hips at times. She is not exercising regularly although she does a great deal of gardening. She is not using her compression sleeve on the left side but she does have a pump at home which she uses one half hours daily. Aside from these issues a detailed review of systems today was stable  PAST MEDICAL HISTORY: Past Medical History:  Diagnosis Date  . Bronchitis    completed antibiotics 10/21/13/ states resolved  . Cancer ([place]) 1998, 2015   breast  . Chronic diastolic (congestive) heart failure ([place])   . Lymph edema    lt arm  . Stroke [facility]) 2004   TIA-  no problems since    PAST SURGICAL HISTORY: Past Surgical History:  Procedure Laterality Date  . ABDOMINOPLASTY/PANNICULECTOMY    . BREAST IMPLANT EXCHANGE Left 09/03/2014   Procedure: LEFT REMOVAL TISSUE EXPANDERS WITH PLACEMENT OF SILICONE IMPLANT ;  Surgeon: Irene Limbo, MD;  Location: [place];  Service: Plastics;  Laterality: Left;  . BREAST RECONSTRUCTION Left 03/07/2015   Procedure: LEFT NIPPLE AEROLA CREATION WITH LOCAL FLAP/FULL THICKNESS SKIN GRAFT FROM GROIN;  Surgeon: Irene Limbo, MD;  Location:  [place];  Service: Plastics;  Laterality: Left;  . BREAST SURGERY  1998   lumpectomy on left and removed lymphnodes in [place]  . LATISSIMUS FLAP TO BREAST Left 04/12/2014   Procedure: LEFT LATISSIMUS DORSI FLAP FOR LEFT BREAST RECONSTRUCTION AND PLACEMENT OF TISSUE EXPANDER;  Surgeon: Irene Limbo, MD;  Location: [place];  Service: Plastics;  Laterality: Left;  . LIPOSUCTION WITH  LIPOFILLING Left 09/03/2014   Procedure:  LIPOFILLING TO LEFT CHEST  ;  Surgeon: Irene Limbo, MD;  Location: [place];  Service: Plastics;  Laterality: Left;  . LIPOSUCTION WITH LIPOFILLING Left 03/07/2015   Procedure: LIPOFILLING TO LEFT BREAST;  Surgeon: Irene Limbo, MD;  Location: [place];  Service: Plastics;  Laterality: Left;  Marland Kitchen MASTOPEXY Right 09/03/2014   Procedure: RIGHT BREAST MASTOPEXY ;  Surgeon: Irene Limbo, MD;  Location: [place];  Service: Plastics;  Laterality: Right;  . PORT-A-CATH REMOVAL Right 12/24/2014   Procedure: REMOVAL PORT-A-CATH;  Surgeon: Donnie Mesa, MD;  Location: [place];  Service: General;  Laterality: Right;  . PORTACATH PLACEMENT Right 10/25/2013   Procedure: INSERTION PORT-A-CATH;  Surgeon: Imogene Burn. Georgette Dover, MD;  Location: [facility];  Service: General;  Laterality: Right;  . SIMPLE MASTECTOMY WITH AXILLARY SENTINEL NODE BIOPSY Left 04/12/2014   Procedure: MASTECTOMY;  Surgeon: Imogene Burn. Georgette Dover, MD;  Location: [facility] [place];  Service: General;  Laterality: Left;  . TISSUE EXPANDER PLACEMENT Left 04/12/2014   Procedure: TISSUE EXPANDER;  Surgeon: Irene Limbo, MD;  Location: [place];  Service: Plastics;  Laterality: Left;  . TISSUE EXPANDER PLACEMENT Left 05/22/2014   Procedure: PLACEMENT OF TISSUE EXPANDER/I&D WASHOUT SEROMA;  Surgeon: Irene Limbo, MD;  Location: [place];  Service: Plastics;  Laterality: Left;    FAMILY HISTORY Family History  Problem Relation Age of Onset  . Breast cancer Mother 17  . Heart disease Father   . Heart attack Father   . Breast cancer Maternal Grandmother 47  . Brain cancer Maternal Uncle 74       benign brain tumor  . Heart attack Paternal Aunt     GYNECOLOGIC HISTORY:   menarche age 84, first live birth age 21. The patient is GX P2. She went through the change of life approximately 2011.   SOCIAL HISTORY:  The patient worked previously as a  Marine scientist. She has 2 children both of whom live in [place], works for the [employer], and [employer], a Librarian, academic. They are in their early 3s. There are no grandchildren. The patient married [name] (both women are originally from [place]) in [place]. [place] with 5 dogs, 40+ chickens and 2 horses. They're not church attenders    ADVANCED DIRECTIVES: in place   HEALTH MAINTENANCE: Social History  Substance Use Topics  . Smoking status: Never Smoker  . Smokeless tobacco: Never Used  . Alcohol use Yes     Comment: 2 drinks/day     Colonoscopy:  PAP: May 2017  Bone density:  Lipid panel:  Allergies  Allergen Reactions  . Adhesive [Tape] Rash    Band-aids    Current Outpatient Prescriptions  Medication Sig Dispense Refill  . triamterene-hydrochlorothiazide (DYAZIDE) 37.5-25 MG capsule Take 1 capsule by mouth daily.    Marland Kitchen anastrozole (ARIMIDEX) 1 MG tablet TAKE 1 TABLET (1 MG TOTAL) BY MOUTH DAILY. 90 tablet 3   No current facility-administered medications for this visit.     OBJECTIVE:  middle-aged white woman In no acute distress  Vitals:   03/29/17 0950  BP: (!) 183/83  Pulse: (!) 57  Resp: 20  Temp: 98.6 F (37 C)     Body mass index is 29.12 kg/m.    Filed Weights   03/29/17 0950  Weight: 164 lb 6.4 oz (74.6 kg)       ECOG FS:1 - Symptomatic but completely ambulatory   Sclerae unicteric, EOMs intact Oropharynx clear and moist No cervical or supraclavicular adenopathy Lungs no rales or rhonchi Heart regular rate and rhythm Abd soft, nontender, positive bowel sounds MSK no focal spinal tenderness, no upper extremity lymphedema Neuro: nonfocal, well oriented, appropriate affect Breasts: The right breast is status post reduction mammoplasty. The left breast is status post mastectomy with implant and latissimus reconstruction. There is no evidence of local recurrence. Both axillae are benign.  LAB  RESULTS:  CMP     Component Value Date/Time   NA 140 02/16/2017 1323   K 3.7 02/16/2017 1323   CL 107 04/08/2016 0213   CO2 26 02/16/2017 1323   GLUCOSE 113 02/16/2017 1323   BUN 12.8 02/16/2017 1323   CREATININE 0.8 02/16/2017 1323   CALCIUM 9.7 02/16/2017 1323   PROT 6.9 02/16/2017 1323   ALBUMIN 4.1 02/16/2017 1323   AST 34 02/16/2017 1323   ALT 58 (H) 02/16/2017 1323   ALKPHOS 107 02/16/2017 1323   BILITOT 0.28 02/16/2017 1323   GFRNONAA >60 04/08/2016 0213   GFRAA >60 04/08/2016 0213    I No results found for: SPEP  Lab Results  Component Value Date   WBC 6.0 02/16/2017   NEUTROABS 3.6 02/16/2017   HGB 13.1 02/16/2017   HCT 40.0 02/16/2017   MCV 93.7 02/16/2017   PLT 262 02/16/2017      Chemistry      Component Value Date/Time   NA 140 02/16/2017 1323   K 3.7 02/16/2017 1323   CL 107 04/08/2016 0213   CO2 26 02/16/2017 1323   BUN 12.8 02/16/2017 1323   CREATININE 0.8 02/16/2017 1323      Component Value Date/Time   CALCIUM 9.7 02/16/2017 1323   ALKPHOS 107 02/16/2017 1323   AST 34 02/16/2017 1323   ALT 58 (H) 02/16/2017 1323   BILITOT 0.28 02/16/2017 1323       No results found for: LABCA2  No components found for: LABCA125  No results for input(s): INR in the last 168 hours.  Urinalysis    Component Value Date/Time   COLORURINE YELLOW 04/05/2016 2040   APPEARANCEUR CLEAR 04/05/2016 2040   LABSPEC 1.027 04/05/2016 2040   LABSPEC 1.005 05/22/2014 1130   PHURINE 7.0 04/05/2016 2040   GLUCOSEU NEGATIVE 04/05/2016 2040   GLUCOSEU Negative 05/22/2014 1130   HGBUR NEGATIVE 04/05/2016 2040   BILIRUBINUR NEGATIVE 04/05/2016 2040   BILIRUBINUR Negative 05/22/2014 1130   KETONESUR NEGATIVE 04/05/2016 2040   PROTEINUR 100 (A) 04/05/2016 2040   UROBILINOGEN 0.2 05/22/2014 1130   NITRITE NEGATIVE 04/05/2016 2040   LEUKOCYTESUR NEGATIVE 04/05/2016 2040   LEUKOCYTESUR Negative 05/22/2014 1130    STUDIES: [facility] showed [facility] breast density to be category C. There was no evidence of malignancy.  Most recent bone density scan on 07/29/14 showed a t-score of -1.2 (osteopenia).  ASSESSMENT: 56 y.o. BRCA negative [place] woman, [place] speaker   (1) status post left lumpectomy and axillary lymph node dissection in 1998 in [place] for a stage II invasive ductal  carcinoma, grade 2, treated with adjuvant chemotherapy (specific drugs not clear) and adjuvant radiation.  (2) status post left breast upper outer quadrant biopsy 10/04/2013 for a clinically mT2 N0, stage IIA invasive ductal carcinoma, grade 2, estrogen receptor 100% positive, progesterone receptor 33% positive, with an MIB-1 of 15%, and HER-2 amplified; staging studies showed no evidence of metastatic disease  (3) started on neoadjuvant carboplatin/ docetaxel/ trastuzumab/ pertuzumab 11/12/2013  (a) completed 4 cycles as of 05/06/201, after which docetaxel was discontinued because of neuropathy symptoms   (b) gemcitabine was given day 1 and day 8 with carboplatin day 1 in the final 2 cycles, completed 03/04/2014   (c)  completed a year of trastuzumab March 2016--  Final echo 10/21/2014 shows a normal EF  (4) left mastectomy  04/12/2014 showed residual mpT1c pNX invasive ductal carcinoma, grade 2, estrogen and progesterone receptor positive, HER-2 negative, with negative margins  (5) anastrozole started 07/14/2014  (a) bone density 07/29/2014 shows T score - 1.2 (mild osteopenia)  (6) 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   PLAN: Teyana is now 3 years out from definitive surgery for her breast cancer with no evidence of disease recurrence. This is very favorable.  The plan is to continue anastrozole to a total of 5 years.  She will need a repeat bone density and we will obtain that March of next year when she has her next mammogram.  I think she would benefit from wearing  a sleeve on the left arm pretty much all the time. This sleeve she has is uncomfortable and unsightly. I wrote her a prescription for some of the more modern designer compression sleeves and she will check that out at [facility] today.  Her blood pressure was high and apparently it has been high at home also. She is only taking a half a Dyazide a day. I suggested she go ahead and take the whole pill and check her blood pressure when she is relaxed. If it remains high she will let her primary care physician now.  Otherwise she will return to see me in one year. She knows to call for any problems that may develop before that visit.  She will need a repeat bone density. We will do that at [facility] with her next mammogram, March 2019.    is now 2 years out from definitive surgery for her breast cancer with no evidence of disease recurrence. This is very favorable.  She is tolerating the anastrozole without any unusual side effects other than her hair thinning a little. We discussed stopping the drug for a couple of months to see that metanephrines but she feels that drug is more important than the hair so we are continuing a right superior.  I urged her to always wear her compression sleeve and particularly a glove since she does a lot of yard work and she is at risk of putting her skin in getting [place] left upper extremity cellulitis. If that were to happen she will call us immediately of course.  In my experience after some hair loss things stabilize and sometimes they are recovers completely., Even without stopping that drug.   [name], MD   03/29/2017 10:35 AM

## 2017-04-22 ENCOUNTER — Inpatient Hospital Stay: Admission: RE | Admit: 2017-04-22 | Payer: Federal, State, Local not specified - PPO | Source: Ambulatory Visit

## 2017-06-17 DIAGNOSIS — I10 Essential (primary) hypertension: Secondary | ICD-10-CM | POA: Diagnosis not present

## 2017-06-17 DIAGNOSIS — Z23 Encounter for immunization: Secondary | ICD-10-CM | POA: Diagnosis not present

## 2017-06-17 DIAGNOSIS — C50912 Malignant neoplasm of unspecified site of left female breast: Secondary | ICD-10-CM | POA: Diagnosis not present

## 2017-07-12 DIAGNOSIS — L989 Disorder of the skin and subcutaneous tissue, unspecified: Secondary | ICD-10-CM | POA: Diagnosis not present

## 2017-07-12 DIAGNOSIS — D398 Neoplasm of uncertain behavior of other specified female genital organs: Secondary | ICD-10-CM | POA: Diagnosis not present

## 2017-07-12 DIAGNOSIS — N9089 Other specified noninflammatory disorders of vulva and perineum: Secondary | ICD-10-CM | POA: Diagnosis not present

## 2017-07-12 DIAGNOSIS — L900 Lichen sclerosus et atrophicus: Secondary | ICD-10-CM | POA: Diagnosis not present

## 2017-07-12 DIAGNOSIS — I10 Essential (primary) hypertension: Secondary | ICD-10-CM | POA: Diagnosis not present

## 2017-08-24 IMAGING — MR MR [PERSON_NAME]*[PERSON_NAME]* WO/W CM
5 of 10 series · 19 of 40 positions shown · IV contrast (15    MH)
Comparison: Radiographs 04/05/2016

CLINICAL DATA: Left hand pain hand, swelling with redness worsening
today. Fever.

EXAM:
MRI OF THE LEFT HAND WITHOUT AND WITH CONTRAST
TECHNIQUE: Multiplanar, multisequence MR imaging was performed both before and
after administration of intravenous contrast.
CONTRAST:  15mL MULTIHANCE GADOBENATE DIMEGLUMINE 529 MG/ML IV SOLN

[Series 6: T2 fat-sat · axial · 3.0mm · 0.27mm/px · z∈[-118,-14]mm · 4 of 37 slices shown (1 of 2)]
[im 1/37]
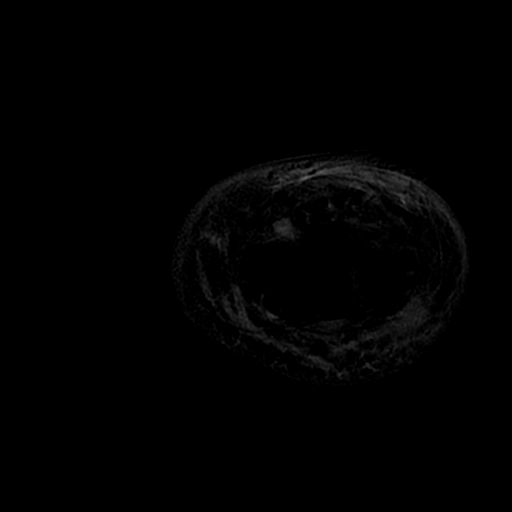
[im 13/37]
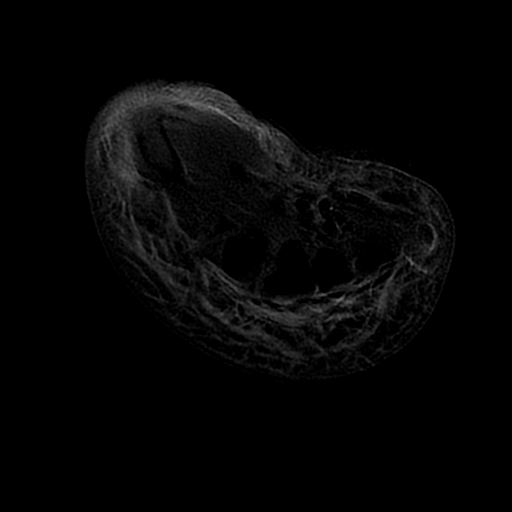
[im 25/37]
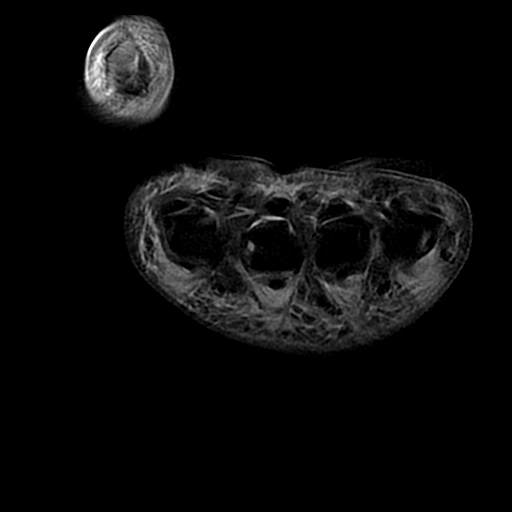
[im 37/37]
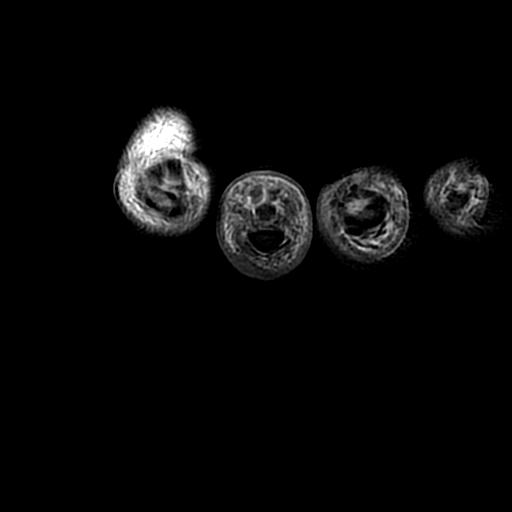

[Series 9: T1 fat-sat · axial · non-contrast · 3.0mm · 0.27mm/px · z∈[-115,-14]mm · 5 of 36 slices shown]
[im 1/36]
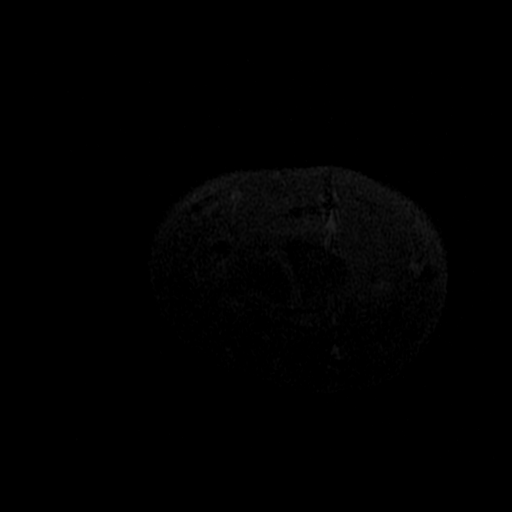
[im 9/36]
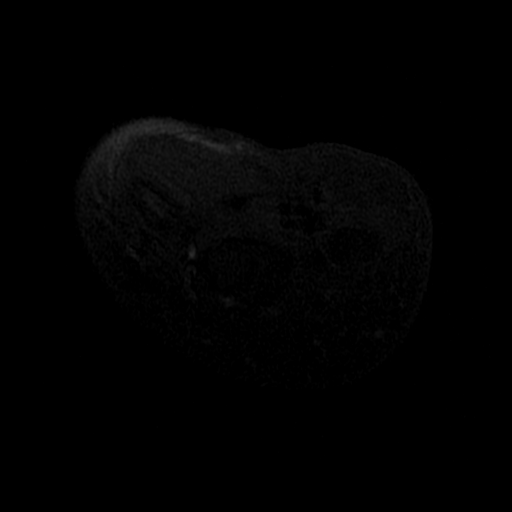
[im 18/36]
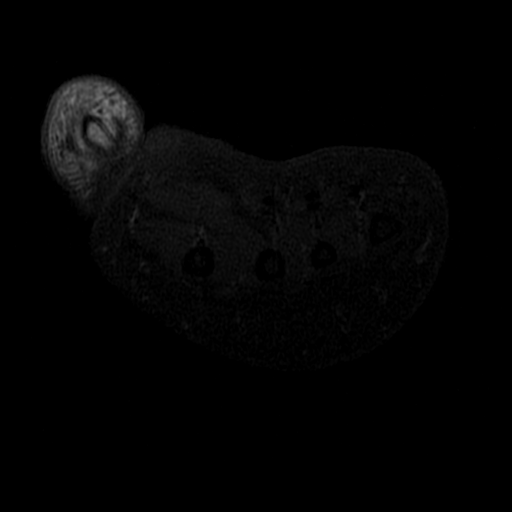
[im 27/36]
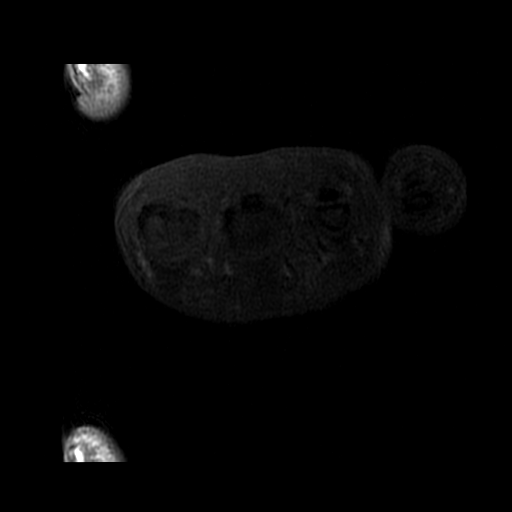
[im 36/36]
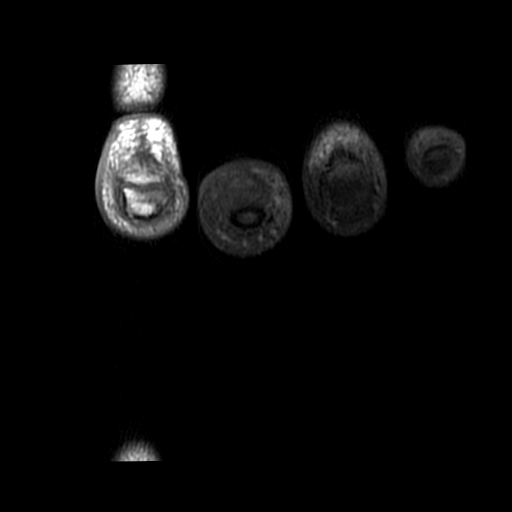

[Series 10: T2 fat-sat · coronal · 4.0mm · 0.33mm/px · 3 of 19 slices shown (2 of 2)]
[im 1/19]
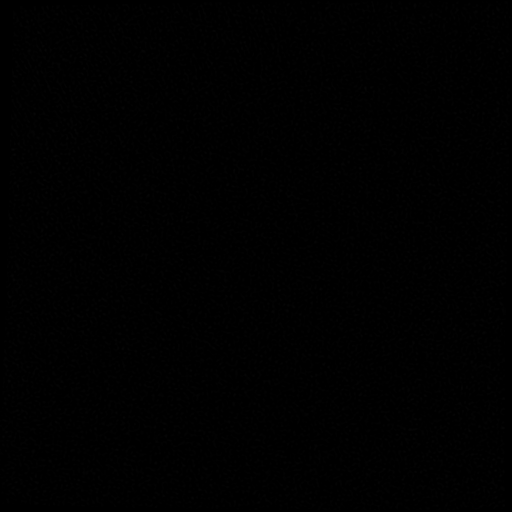
[im 10/19]
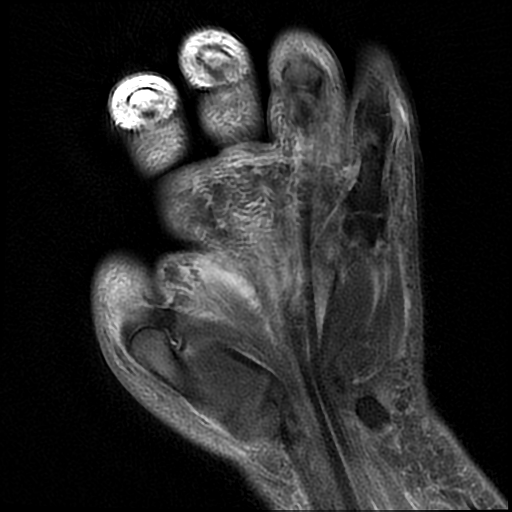
[im 19/19]
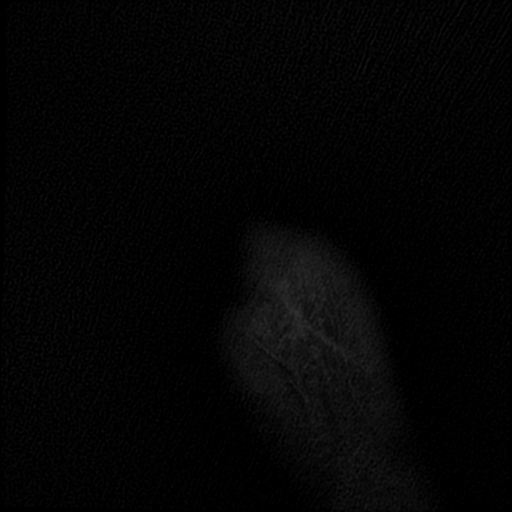

[Series 12: PD · coronal · 4.0mm · 0.33mm/px · 3 of 19 slices shown]
[im 1/19]
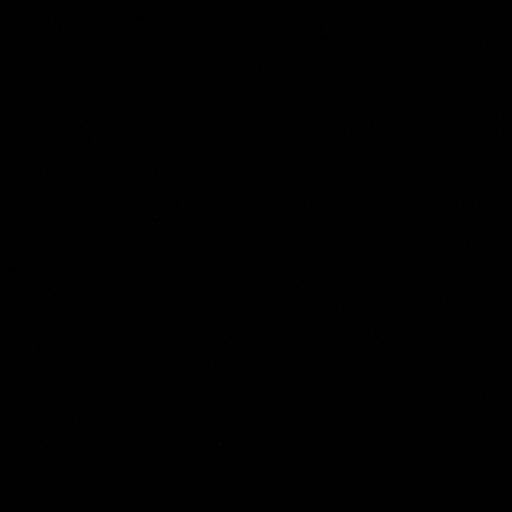
[im 10/19]
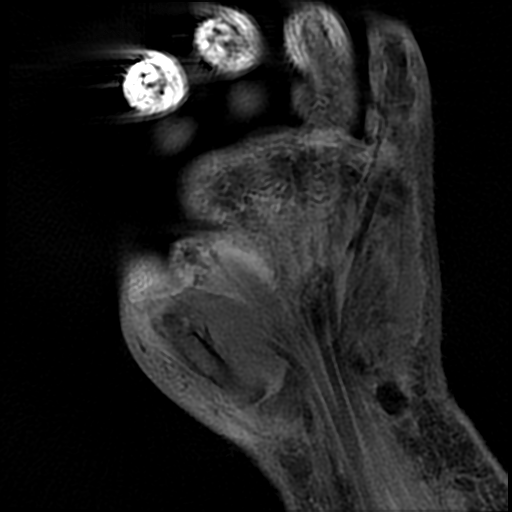
[im 19/19]
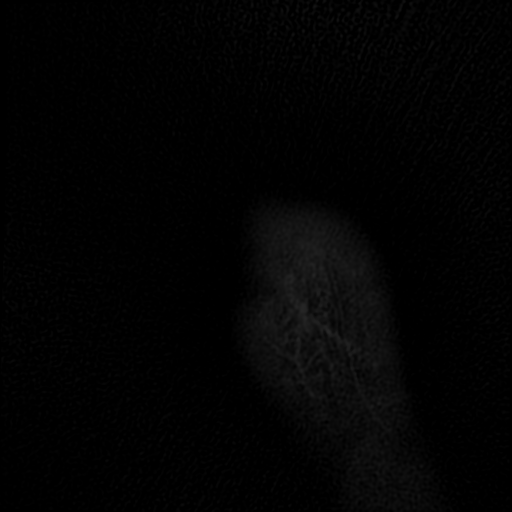

[Series 14: T1 fat-sat post-contrast · sagittal · 4.0mm · 0.27mm/px · 4 of 26 slices shown]
[im 1/26]
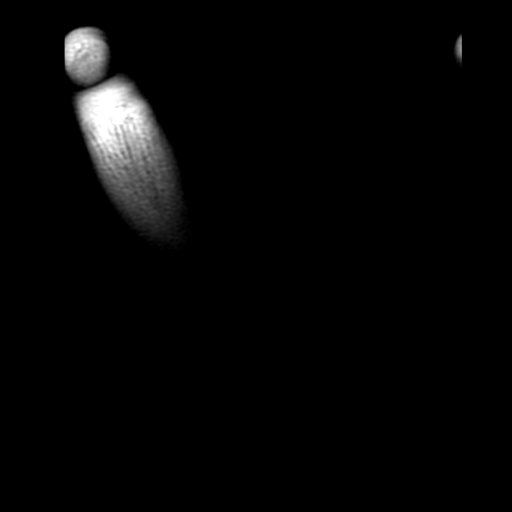
[im 9/26]
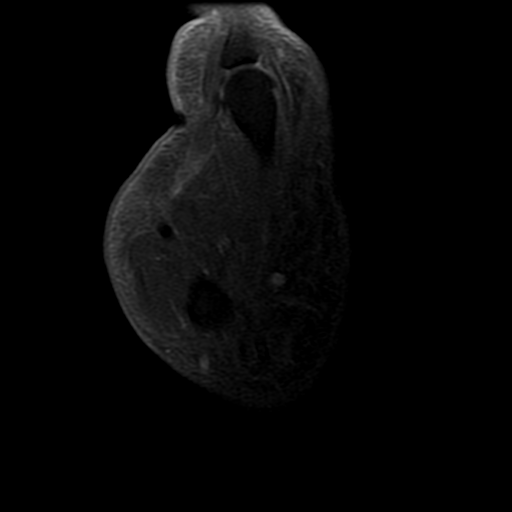
[im 17/26]
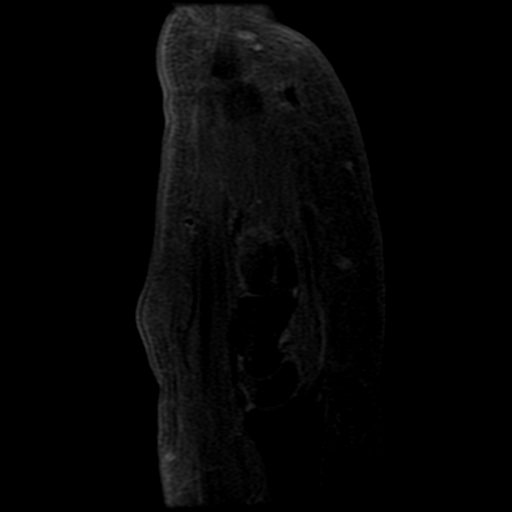
[im 26/26]
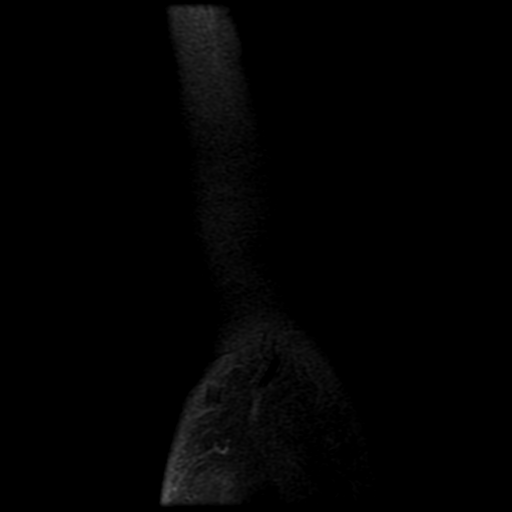

[19 of 40 positions shown; findings below may reference images not displayed]

FINDINGS: Examination is quite limited due to patient motion.

Diffuse and fairly marked subcutaneous soft tissue swelling/ edema/
fluid suggesting cellulitis. No discrete drainable soft tissue
abscess.

No findings for myofasciitis or pyomyositis. Extensive fluid
surrounding the extensor tendon suggesting sympathetic versus septic
tenosynovitis.

No findings for septic arthritis or osteomyelitis.
IMPRESSION: Diffuse cellulitis and sympathetic or septic extensor tenosynovitis
but no findings for pyomyositis, septic arthritis or osteomyelitis.

## 2017-09-27 DIAGNOSIS — L900 Lichen sclerosus et atrophicus: Secondary | ICD-10-CM | POA: Diagnosis not present

## 2017-11-04 ENCOUNTER — Other Ambulatory Visit: Payer: Self-pay | Admitting: Oncology

## 2017-11-04 DIAGNOSIS — Z139 Encounter for screening, unspecified: Secondary | ICD-10-CM

## 2017-11-04 DIAGNOSIS — M8580 Other specified disorders of bone density and structure, unspecified site: Secondary | ICD-10-CM

## 2017-11-24 ENCOUNTER — Ambulatory Visit
Admission: RE | Admit: 2017-11-24 | Discharge: 2017-11-24 | Disposition: A | Discharge status: Home / Self Care | Payer: Federal, State, Local not specified - PPO | Source: Ambulatory Visit | Attending: Oncology | Admitting: Oncology

## 2017-11-24 DIAGNOSIS — M8580 Other specified disorders of bone density and structure, unspecified site: Secondary | ICD-10-CM

## 2017-11-24 DIAGNOSIS — Z139 Encounter for screening, unspecified: Secondary | ICD-10-CM

## 2017-11-24 DIAGNOSIS — M8589 Other specified disorders of bone density and structure, multiple sites: Secondary | ICD-10-CM | POA: Diagnosis not present

## 2017-12-13 ENCOUNTER — Ambulatory Visit
Admission: RE | Admit: 2017-12-13 | Discharge: 2017-12-13 | Disposition: A | Discharge status: Home / Self Care | Payer: Federal, State, Local not specified - PPO | Source: Ambulatory Visit | Attending: Oncology | Admitting: Oncology

## 2017-12-13 DIAGNOSIS — Z1231 Encounter for screening mammogram for malignant neoplasm of breast: Secondary | ICD-10-CM | POA: Diagnosis not present

## 2017-12-16 DIAGNOSIS — I10 Essential (primary) hypertension: Secondary | ICD-10-CM | POA: Diagnosis not present

## 2017-12-16 DIAGNOSIS — R5383 Other fatigue: Secondary | ICD-10-CM | POA: Diagnosis not present

## 2017-12-21 DIAGNOSIS — Z923 Personal history of irradiation: Secondary | ICD-10-CM | POA: Diagnosis not present

## 2017-12-21 DIAGNOSIS — Z853 Personal history of malignant neoplasm of breast: Secondary | ICD-10-CM | POA: Diagnosis not present

## 2017-12-21 DIAGNOSIS — Z9012 Acquired absence of left breast and nipple: Secondary | ICD-10-CM | POA: Diagnosis not present

## 2017-12-26 DIAGNOSIS — L900 Lichen sclerosus et atrophicus: Secondary | ICD-10-CM | POA: Diagnosis not present

## 2017-12-28 DIAGNOSIS — K080 Exfoliation of teeth due to systemic causes: Secondary | ICD-10-CM | POA: Diagnosis not present

## 2018-01-02 ENCOUNTER — Encounter: Payer: Self-pay | Admitting: Physical Therapy

## 2018-01-02 ENCOUNTER — Other Ambulatory Visit: Payer: Self-pay

## 2018-01-02 ENCOUNTER — Ambulatory Visit: Payer: Federal, State, Local not specified - PPO | Attending: Plastic Surgery | Admitting: Physical Therapy

## 2018-01-02 DIAGNOSIS — M25612 Stiffness of left shoulder, not elsewhere classified: Secondary | ICD-10-CM

## 2018-01-02 DIAGNOSIS — I890 Lymphedema, not elsewhere classified: Secondary | ICD-10-CM

## 2018-01-02 DIAGNOSIS — L599 Disorder of the skin and subcutaneous tissue related to radiation, unspecified: Secondary | ICD-10-CM | POA: Diagnosis not present

## 2018-01-02 DIAGNOSIS — M6281 Muscle weakness (generalized): Secondary | ICD-10-CM | POA: Diagnosis not present

## 2018-01-02 NOTE — Therapy (Signed)
[place], [place], [address] Phone: [phone]   Fax:  [phone]  Physical Therapy Evaluation  Patient Details  Name: Kelsey Mclaughlin MRN: [id number] Date of Birth: March 08, 1961 Referring Provider: Thimmappa   Encounter Date: 01/02/2018  PT End of Session - 01/02/18 1607    Visit Number  1    Number of Visits  9    Date for PT Re-Evaluation  02/06/18    PT Start Time  1401 pt arrived late    PT Stop Time  1433    PT Time Calculation (min)  32 min    Activity Tolerance  Patient tolerated treatment well    Behavior During Therapy  WFL for tasks assessed/performed       Past Medical History:  Diagnosis Date  . Bronchitis    completed antibiotics 10/21/13/ states resolved  . Cancer ([place]) 1998, 2015   Left Breast  . Chronic diastolic (congestive) heart failure ([place])   . Lymph edema    lt arm  . Stroke [facility]) 2004   TIA-  no problems since    Past Surgical History:  Procedure Laterality Date  . ABDOMINOPLASTY/PANNICULECTOMY    . BREAST IMPLANT EXCHANGE Left 09/03/2014   Procedure: LEFT REMOVAL TISSUE EXPANDERS WITH PLACEMENT OF SILICONE IMPLANT ;  Surgeon: Irene Limbo, MD;  Location: [place];  Service: Plastics;  Laterality: Left;  . BREAST RECONSTRUCTION Left 03/07/2015   Procedure: LEFT NIPPLE AEROLA CREATION WITH LOCAL FLAP/FULL THICKNESS SKIN GRAFT FROM GROIN;  Surgeon: Irene Limbo, MD;  Location: [place];  Service: Plastics;  Laterality: Left;  . BREAST SURGERY  1998   lumpectomy on left and removed lymphnodes in [place]  . LATISSIMUS FLAP TO BREAST Left 04/12/2014   Procedure: LEFT LATISSIMUS DORSI FLAP FOR LEFT BREAST RECONSTRUCTION AND PLACEMENT OF TISSUE EXPANDER;  Surgeon: Irene Limbo, MD;  Location: [place];  Service: Plastics;  Laterality: Left;  . LIPOSUCTION WITH LIPOFILLING Left 09/03/2014   Procedure:  LIPOFILLING TO LEFT CHEST   ;  Surgeon: Irene Limbo, MD;  Location: [place];  Service: Plastics;  Laterality: Left;  . LIPOSUCTION WITH LIPOFILLING Left 03/07/2015   Procedure: LIPOFILLING TO LEFT BREAST;  Surgeon: Irene Limbo, MD;  Location: [place];  Service: Plastics;  Laterality: Left;  Marland Kitchen MASTECTOMY Left 2015  . MASTOPEXY Right 09/03/2014   Procedure: RIGHT BREAST MASTOPEXY ;  Surgeon: Irene Limbo, MD;  Location: [place];  Service: Plastics;  Laterality: Right;  . PORT-A-CATH REMOVAL Right 12/24/2014   Procedure: REMOVAL PORT-A-CATH;  Surgeon: Donnie Mesa, MD;  Location: [place];  Service: General;  Laterality: Right;  . PORTACATH PLACEMENT Right 10/25/2013   Procedure: INSERTION PORT-A-CATH;  Surgeon: Imogene Burn. Georgette Dover, MD;  Location[facility];  Service: General;  Laterality: Right;  . SIMPLE MASTECTOMY WITH AXILLARY SENTINEL NODE BIOPSY Left 04/12/2014   Procedure: MASTECTOMY;  Surgeon: Imogene Burn. Georgette Dover, MD;  Location: [place];  Service: General;  Laterality: Left;  . TISSUE EXPANDER PLACEMENT Left 04/12/2014   Procedure: TISSUE EXPANDER;  Surgeon: Irene Limbo, MD;  Location: [place];  Service: Plastics;  Laterality: Left;  . TISSUE EXPANDER PLACEMENT Left 05/22/2014   Procedure: PLACEMENT OF TISSUE EXPANDER/I&D WASHOUT SEROMA;  Surgeon: Irene Limbo, MD;  Location: [place];  Service: Plastics;  Laterality: Left;    There were no vitals filed for this visit.   Subjective Assessment - 01/02/18 1405  Subjective  I had swelling since 1998 in my LUE. I had a lumpectomy in 1998 and an axillary lymph node dissection. In 2015 I had a mastectomy due to recurrence. It was the same breast. Every since I had the new implant placed my arm pit is not healing because it keeps breaking open. She has a compression pump and compression garments but the sleeve is uncomfortable.     Pertinent History  mini stroke 2004, hx of L breast cancer with  lumpectomy in 1998 and ALND- pt completed chemo and radiation in 1998, then she had a new cancer in her left breast in 2015 and underwent a mastectomy with lat flap reconstruction and has completed chemotherapy    Patient Stated Goals  to be able to move LUE more    Currently in Pain?  No/denies         [facility] PT Assessment - 01/02/18 0001      Assessment   Medical Diagnosis  left breast cancer    Referring Provider  Thimmappa    Onset Date/Surgical Date  09/13/13    Hand Dominance  Right    Prior Therapy  previous lymphedema services at this clinic 2 years ago      Precautions   Precautions  Other (comment)    Precaution Comments  at risk for lymphedema      Restrictions   Weight Bearing Restrictions  No      Balance Screen   Has the patient fallen in the past 6 months  No    Has the patient had a decrease in activity level because of a fear of falling?   No    Is the patient reluctant to leave their home because of a fear of falling?   No      Home Film/video editor residence    Living Arrangements  Spouse/significant other    Available Help at Discharge  Family    Type of [place] to enter    Entrance Stairs-Number of Steps  2    [place]  One level    [place]  None      Prior Function   Level of [place]  Unemployed    Leisure  pt works outside often on a small farm      Cognition   Overall Cognitive Status  Within Functional Limits for tasks assessed      Observation/Other Assessments   Observations  1 large cord in left axilla with small scab in place, pt reports that it was open and bleeding 2-3 months ago, this area has opened several times since 1998 due to tightness at cord      ROM / Strength   AROM / PROM / Strength  AROM      AROM   AROM Assessment Site  Shoulder    Right/Left Shoulder  Right;Left    Right Shoulder Flexion  166  Degrees    Right Shoulder ABduction  179 Degrees    Right Shoulder Internal Rotation  71 Degrees    Right Shoulder External Rotation  90 Degrees    Left Shoulder Flexion  134 Degrees    Left Shoulder ABduction  95 Degrees    Left Shoulder Internal Rotation  65 Degrees    Left Shoulder External Rotation  72 Degrees  LYMPHEDEMA/ONCOLOGY QUESTIONNAIRE - 01/02/18 1420      Type   Cancer Type  left breast cancer      Surgeries   Mastectomy Date  09/13/13    Lumpectomy Date  02/11/97    Axillary Lymph Node Dissection Date  02/11/97    Number Lymph Nodes Removed  21      Date Lymphedema/Swelling Started   Date  02/11/97      Treatment   Active Chemotherapy Treatment  No    Past Chemotherapy Treatment  Yes    Date  -- in 1998 and 2015    Active Radiation Treatment  No    Past Radiation Treatment  Yes    Date  -- completed in 2998    Current Hormone Treatment  Yes    Drug Name  Anastrozole    Past Hormone Therapy  No      What other symptoms do you have   Are you Having Heaviness or Tightness  Yes    Are you having Pain  Yes    Are you having pitting edema  No    Is it Hard or Difficult finding clothes that fit  No    Is there Decreased scar mobility  Yes      Lymphedema Assessments   Lymphedema Assessments  Upper extremities      Right Upper Extremity Lymphedema   15 cm Proximal to Olecranon Process  31 cm    Olecranon Process  29.4 cm    15 cm Proximal to Ulnar Styloid Process  26.5 cm    Just Proximal to Ulnar Styloid Process  18.2 cm    Across Hand at [employer]  21 cm    At [place] of 2nd Digit  7 cm      Left Upper Extremity Lymphedema   15 cm Proximal to Olecranon Process  32.5 cm    Olecranon Process  30 cm    15 cm Proximal to Ulnar Styloid Process  27 cm    Just Proximal to Ulnar Styloid Process  20 cm    Across Hand at [employer]  22.5 cm    At [place] of 2nd Digit  7.5 cm             Objective measurements completed on examination:  See above findings.                   PT Long Term Goals - 01/02/18 1620      PT LONG TERM GOAL #1   Title  Pt will report a 75% improvement in cording in left axilla to allow improved skin integrity and ROM.    Time  4    Period  Weeks    Status  New    Target Date  02/06/18      PT LONG TERM GOAL #2   Title  Pt will demonstrate 150 degrees of left shoulder abduction to allow pt to reach out to sides.    Baseline  95    Time  4    Period  Weeks    Status  New    Target Date  02/06/18      PT LONG TERM GOAL #3   Title  Pt will demonstrate 150 degrees of left shoulder flexion to allow her to reach for items overhead    Baseline  134    Time  4    Period  Weeks    Status  New  Target Date  02/06/18      PT LONG TERM GOAL #4   Title  Pt will receive appropriate compression garments for management of LUE lymphedema     Baseline  current sleeve causes numbness in hand    Time  4    Period  Weeks    Status  New    Target Date  02/06/18      PT LONG TERM GOAL #5   Title  Pt will report a 50% improvement in feelings of pain and tightness in LUE to allow improved comfort.    Time  4    Period  Weeks    Status  New    Target Date  02/06/18             Plan - 01/02/18 1607    Clinical Impression Statement  Pt presents to PT with increased fascial tightness due to cording in left axilla that has caused her axilla to open and bleed on numerous occasions when she stretches too far. She underwent a lumpectomy and ALND in 1998 on the left and then had a recurrence of left breast cancer in 2015 and underwent a left mastectomy and lat flap reconstruction. She completed radiation and chemo in 1998 and chemo in 2015. She has had increased tightness in left axilla since her most recent reconstruction. There is one large palpable and visible cord in left axilla with small scab in place. Pt reports it has been closed for 2-3 months. She also has a history of lymphedema  in her LUE since 1998 and has been seen at this clinic previously. She would benefit from skilled PT services to help decrease cording to help preserve skin integrity and improve L shoulder ROM which is severely limited by cording and for lymphedema services to decrease LUE swelling. She reports she has a compression pump that she uses regularly but she is unable to wear her sleeve because it causes numbness in her hand.     History and Personal Factors relevant to plan of care:  long standing hx of lymphedema, long standing history of cording    Clinical Presentation  Evolving    Clinical Presentation due to:  recent opening of skin in left axilla due to cording    Rehab Potential  Good    Clinical Impairments Affecting Rehab Potential  hx of radiation, long standing hx of cording and lymphedema    PT Frequency  2x / week    PT Duration  4 weeks will begin when pt returns from vacation the week of May 5    PT Treatment/Interventions  ADLs/Self Care Home Management;Therapeutic exercise;Therapeutic activities;Patient/family education;Manual techniques;Manual lymph drainage;Compression bandaging;Taping;Vasopneumatic Device;Passive range of motion;Scar mobilization    PT Next Visit Plan  begin myofascial to cording - gently since her axilla has opened due to cording, MLD to LUE and assess current compression garments    Consulted and Agree with Plan of Care  Patient;Family member/caregiver    Family Member Consulted  spouse       Patient will benefit from skilled therapeutic intervention in order to improve the following deficits and impairments:  Decreased skin integrity, Increased fascial restricitons, Pain, Decreased scar mobility, Decreased range of motion, Decreased strength, Impaired UE functional use, Increased edema  Visit Diagnosis: Stiffness of left shoulder, not elsewhere classified - Plan: PT plan of care cert/re-cert  Lymphedema, not elsewhere classified - Plan: PT plan of care  cert/re-cert  Muscle weakness (generalized) - Plan: PT plan of  care cert/re-cert  Disorder of the skin and subcutaneous tissue related to radiation, unspecified - Plan: PT plan of care cert/re-cert     Problem List Patient Active Problem List   Diagnosis Date Noted  . Depression   . Hypokalemia 04/06/2016  . Sepsis ([place]) 04/06/2016  . Cellulitis of left hand 04/06/2016  . Lymph edema   . Chronic diastolic (congestive) heart failure ([place])   . Recurrent breast cancer ([place]) 04/12/2014  . Hepatic steatosis 02/01/2014  . Malignant neoplasm of upper-outer quadrant of left breast in female, estrogen receptor positive ([place]) 10/12/2013  . Stroke [facility]) 09/13/2002    [facility] 01/02/2018, 4:25 PM  [place], [place], [address] Phone: [phone]   Fax:  [phone]  Name: Kelsey Mclaughlin MRN: [id number] Date of Birth: 1961-02-11  Manus Gunning, PT 01/02/18 4:25 PM

## 2018-01-12 DIAGNOSIS — F3342 Major depressive disorder, recurrent, in full remission: Secondary | ICD-10-CM | POA: Diagnosis not present

## 2018-01-16 ENCOUNTER — Encounter: Payer: Federal, State, Local not specified - PPO | Admitting: Physical Therapy

## 2018-01-18 ENCOUNTER — Emergency Department (HOSPITAL_COMMUNITY)
Admission: EM | Admit: 2018-01-18 | Discharge: 2018-01-18 | Disposition: A | Discharge status: Home / Self Care | Payer: Federal, State, Local not specified - PPO | Attending: Emergency Medicine | Admitting: Emergency Medicine

## 2018-01-18 ENCOUNTER — Encounter (HOSPITAL_COMMUNITY): Payer: Self-pay

## 2018-01-18 DIAGNOSIS — M7989 Other specified soft tissue disorders: Secondary | ICD-10-CM | POA: Diagnosis not present

## 2018-01-18 DIAGNOSIS — Z79899 Other long term (current) drug therapy: Secondary | ICD-10-CM | POA: Insufficient documentation

## 2018-01-18 DIAGNOSIS — B029 Zoster without complications: Secondary | ICD-10-CM | POA: Diagnosis not present

## 2018-01-18 DIAGNOSIS — L299 Pruritus, unspecified: Secondary | ICD-10-CM | POA: Diagnosis not present

## 2018-01-18 LAB — CBC WITH DIFFERENTIAL/PLATELET
BASOS PCT: 0 %
Band Neutrophils: 0 %
Basophils Absolute: 0 10*3/uL (ref 0.0–0.1)
Blasts: 0 %
EOS PCT: 1 %
Eosinophils Absolute: 0.1 10*3/uL (ref 0.0–0.7)
HCT: 39.2 % (ref 36.0–46.0)
Hemoglobin: 13 g/dL (ref 12.0–15.0)
LYMPHS ABS: 2.3 10*3/uL (ref 0.7–4.0)
Lymphocytes Relative: 27 %
MCH: 30.6 pg (ref 26.0–34.0)
MCHC: 33.2 g/dL (ref 30.0–36.0)
MCV: 92.2 fL (ref 78.0–100.0)
METAMYELOCYTES PCT: 0 %
MONO ABS: 0.7 10*3/uL (ref 0.1–1.0)
MONOS PCT: 8 %
MYELOCYTES: 0 %
NEUTROS ABS: 5.5 10*3/uL (ref 1.7–7.7)
NEUTROS PCT: 64 %
NRBC: 0 /100{WBCs}
Other: 0 %
PLATELETS: 274 10*3/uL (ref 150–400)
Promyelocytes Relative: 0 %
RBC: 4.25 MIL/uL (ref 3.87–5.11)
RDW: 12.9 % (ref 11.5–15.5)
WBC: 8.6 10*3/uL (ref 4.0–10.5)

## 2018-01-18 LAB — COMPREHENSIVE METABOLIC PANEL
ALBUMIN: 4 g/dL (ref 3.5–5.0)
ALT: 31 U/L (ref 14–54)
AST: 32 U/L (ref 15–41)
Alkaline Phosphatase: 72 U/L (ref 38–126)
Anion gap: 12 (ref 5–15)
BUN: 5 mg/dL — AB (ref 6–20)
CHLORIDE: 102 mmol/L (ref 101–111)
CO2: 27 mmol/L (ref 22–32)
CREATININE: 0.61 mg/dL (ref 0.44–1.00)
Calcium: 9.4 mg/dL (ref 8.9–10.3)
GFR calc Af Amer: >60 mL/min (ref >60)
Glucose, Bld: 97 mg/dL (ref 65–99)
POTASSIUM: 4.5 mmol/L (ref 3.5–5.1)
SODIUM: 141 mmol/L (ref 135–145)
Total Bilirubin: 0.8 mg/dL (ref 0.3–1.2)
Total Protein: 6.7 g/dL (ref 6.5–8.1)

## 2018-01-18 LAB — I-STAT CG4 LACTIC ACID, ED: LACTIC ACID, VENOUS: 1.86 mmol/L (ref 0.5–1.9)

## 2018-01-18 MED ORDER — DOXYCYCLINE HYCLATE 100 MG PO CAPS: 100.0000 mg | ORAL_CAPSULE | Freq: Two times a day (BID) | ORAL | 0 refills | Status: DC

## 2018-01-18 MED ORDER — PREDNISONE 20 MG PO TABS: ORAL_TABLET | ORAL | 0 refills | Status: DC

## 2018-01-18 MED ORDER — VALACYCLOVIR HCL 1 G PO TABS: 1000.0000 mg | ORAL_TABLET | Freq: Three times a day (TID) | ORAL | 0 refills | Status: DC

## 2018-01-18 MED ORDER — DOXYCYCLINE HYCLATE 100 MG PO TABS
100.0000 mg | ORAL_TABLET | Freq: Once | ORAL | Status: AC
  Administered 2018-01-18: 100 mg via ORAL
  Filled 2018-01-18: qty 1

## 2018-01-18 MED ORDER — VALACYCLOVIR HCL 500 MG PO TABS
1000.0000 mg | ORAL_TABLET | Freq: Once | ORAL | Status: AC
  Administered 2018-01-18: 1000 mg via ORAL
  Filled 2018-01-18: qty 2

## 2018-01-18 MED ORDER — PREDNISONE 20 MG PO TABS
60.0000 mg | ORAL_TABLET | Freq: Once | ORAL | Status: AC
  Administered 2018-01-18: 60 mg via ORAL
  Filled 2018-01-18: qty 3

## 2018-01-18 NOTE — ED Provider Notes (Signed)
[place] EMERGENCY DEPARTMENT Provider Note   CSN: [id number] Arrival date & time: 01/18/18  1252     History   Chief Complaint Chief Complaint  Patient presents with  . Arm infection    HPI Kelsey Mclaughlin is a 57 y.o. female.  57 yo F with a chief complaint of left arm redness.  This is described as itchy and burning.  Going on for the past day or so.  She thinks that she was bit by some small black flying insects.  She has had some low-grade temperatures.  That was 101 at home.  Denies nausea or vomiting.  Denies any other break in the skin.  She has a history of lymphedema from a prior left mastectomy.  She has had cellulitis to this area before and required admission at that time.  They do not feel that this is as bad as last time and requesting antibiotic therapy.  The history is provided by the patient.  Illness  This is a new problem. The current episode started yesterday. The problem occurs constantly. The problem has not changed since onset.Pertinent negatives include no chest pain, no headaches and no shortness of breath. Nothing aggravates the symptoms. Nothing relieves the symptoms. She has tried nothing for the symptoms. The treatment provided no relief.    Past Medical History:  Diagnosis Date  . Bronchitis    completed antibiotics 10/21/13/ states resolved  . Cancer ([place]) 1998, 2015   Left Breast  . Chronic diastolic (congestive) heart failure ([place])   . Lymph edema    lt arm  . Stroke [facility]) 2004   TIA-  no problems since    Patient Active Problem List   Diagnosis Date Noted  . Depression   . Hypokalemia 04/06/2016  . Sepsis ([place]) 04/06/2016  . Cellulitis of left hand 04/06/2016  . Lymph edema   . Chronic diastolic (congestive) heart failure ([place])   . Recurrent breast cancer ([place]) 04/12/2014  . Hepatic steatosis 02/01/2014  . Malignant neoplasm of upper-outer quadrant of left breast in female, estrogen receptor positive ([place])  10/12/2013  . Stroke [facility]) 09/13/2002    Past Surgical History:  Procedure Laterality Date  . ABDOMINOPLASTY/PANNICULECTOMY    . BREAST IMPLANT EXCHANGE Left 09/03/2014   Procedure: LEFT REMOVAL TISSUE EXPANDERS WITH PLACEMENT OF SILICONE IMPLANT ;  Surgeon: Irene Limbo, MD;  Location: [place];  Service: Plastics;  Laterality: Left;  . BREAST RECONSTRUCTION Left 03/07/2015   Procedure: LEFT NIPPLE AEROLA CREATION WITH LOCAL FLAP/FULL THICKNESS SKIN GRAFT FROM GROIN;  Surgeon: Irene Limbo, MD;  Location: [place];  Service: Plastics;  Laterality: Left;  . BREAST SURGERY  1998   lumpectomy on left and removed lymphnodes in [place]  . LATISSIMUS FLAP TO BREAST Left 04/12/2014   Procedure: LEFT LATISSIMUS DORSI FLAP FOR LEFT BREAST RECONSTRUCTION AND PLACEMENT OF TISSUE EXPANDER;  Surgeon: Irene Limbo, MD;  Location: [place];  Service: Plastics;  Laterality: Left;  . LIPOSUCTION WITH LIPOFILLING Left 09/03/2014   Procedure:  LIPOFILLING TO LEFT CHEST  ;  Surgeon: Irene Limbo, MD;  Location: [place];  Service: Plastics;  Laterality: Left;  . LIPOSUCTION WITH LIPOFILLING Left 03/07/2015   Procedure: LIPOFILLING TO LEFT BREAST;  Surgeon: Irene Limbo, MD;  Location: [place];  Service: Plastics;  Laterality: Left;  Marland Kitchen MASTECTOMY Left 2015  . MASTOPEXY Right 09/03/2014   Procedure: RIGHT BREAST MASTOPEXY ;  Surgeon: Irene Limbo, MD;  Location: [place];  Service: Plastics;  Laterality: Right;  . PORT-A-CATH REMOVAL Right 12/24/2014   Procedure: REMOVAL PORT-A-CATH;  Surgeon: Donnie Mesa, MD;  Location: [place];  Service: General;  Laterality: Right;  . PORTACATH PLACEMENT Right 10/25/2013   Procedure: INSERTION PORT-A-CATH;  Surgeon: Imogene Burn. Georgette Dover, MD;  [facility];  Service: General;  Laterality: Right;  . SIMPLE MASTECTOMY WITH AXILLARY SENTINEL NODE BIOPSY Left  04/12/2014   Procedure: MASTECTOMY;  Surgeon: Imogene Burn. Georgette Dover, MD;  Location: [place];  Service: General;  Laterality: Left;  . TISSUE EXPANDER PLACEMENT Left 04/12/2014   Procedure: TISSUE EXPANDER;  Surgeon: Irene Limbo, MD;  Location: [place];  Service: Plastics;  Laterality: Left;  . TISSUE EXPANDER PLACEMENT Left 05/22/2014   Procedure: PLACEMENT OF TISSUE EXPANDER/I&D WASHOUT SEROMA;  Surgeon: Irene Limbo, MD;  Location: [place];  Service: Plastics;  Laterality: Left;     OB History   None      Home Medications    Prior to Admission medications   Medication Sig Start Date End Date Taking? Authorizing Provider  anastrozole (ARIMIDEX) 1 MG tablet TAKE 1 TABLET (1 MG TOTAL) BY MOUTH DAILY. 11/29/16   Magrinat, Virgie Dad, MD  doxycycline (VIBRAMYCIN) 100 MG capsule Take 1 capsule (100 mg total) by mouth 2 (two) times daily. 01/18/18   Deno Etienne, DO  predniSONE (DELTASONE) 20 MG tablet 2 tabs po daily x 4 days 01/18/18   Deno Etienne, DO  triamterene-hydrochlorothiazide (DYAZIDE) 37.5-25 MG capsule Take 1 capsule by mouth daily.    [provider]  valACYclovir (VALTREX) 1000 MG tablet Take 1 tablet (1,000 mg total) by mouth 3 (three) times daily. 01/18/18   Deno Etienne, DO    Family History Family History  Problem Relation Age of Onset  . Breast cancer Mother 68  . Heart disease Father   . Heart attack Father   . Breast cancer Maternal Grandmother 73  . Brain cancer Maternal Uncle 74       benign brain tumor  . Heart attack Paternal Aunt     Social History Social History   Tobacco Use  . Smoking status: Never Smoker  . Smokeless tobacco: Never Used  Substance Use Topics  . Alcohol use: Yes    Comment: 2 drinks/day  . Drug use: No     Allergies   Adhesive [tape]   Review of Systems Review of Systems  Constitutional: Negative for chills and fever.  HENT: Negative for congestion and rhinorrhea.   Eyes: Negative for redness and visual disturbance.    Respiratory: Negative for shortness of breath and wheezing.   Cardiovascular: Negative for chest pain and palpitations.  Gastrointestinal: Negative for nausea and vomiting.  Genitourinary: Negative for dysuria and urgency.  Musculoskeletal: Negative for arthralgias and myalgias.  Skin: Negative for pallor and wound.  Neurological: Negative for dizziness and headaches.     Physical Exam Updated Vital Signs BP 118/69   Pulse 60   Temp 99.1 F (37.3 C) (Oral)   Resp 16   SpO2 100%   Physical Exam  Constitutional: She is oriented to person, place, and time. She appears well-developed and well-nourished. No distress.  HENT:  Head: Normocephalic and atraumatic.  Eyes: Pupils are equal, round, and reactive to light. EOM are normal.  Neck: Normal range of motion. Neck supple.  Cardiovascular: Normal rate and regular rhythm. Exam reveals no gallop and no friction rub.  No murmur heard. Pulmonary/Chest: Effort normal. She has  no wheezes. She has no rales.  Abdominal: Soft. She exhibits no distension. There is no tenderness.  Musculoskeletal: She exhibits no edema or tenderness.  Neurological: She is alert and oriented to person, place, and time.  Skin: Skin is warm and dry. She is not diaphoretic.  Psychiatric: She has a normal mood and affect. Her behavior is normal.  Nursing note and vitals reviewed.    ED Treatments / Results  Labs (all labs ordered are listed, but only abnormal results are displayed) Labs Reviewed  COMPREHENSIVE METABOLIC PANEL - Abnormal; Notable for the following components:      Result Value   BUN 5 (*)    All other components within normal limits  CBC WITH DIFFERENTIAL/PLATELET  I-STAT CG4 LACTIC ACID, ED  I-STAT CG4 LACTIC ACID, ED    EKG None  Radiology No results found.  Procedures Procedures (including critical care time)  Medications Ordered in ED Medications  predniSONE (DELTASONE) tablet 60 mg (has no administration in time range)   valACYclovir (VALTREX) tablet 1,000 mg (has no administration in time range)  doxycycline (VIBRA-TABS) tablet 100 mg (has no administration in time range)     Initial Impression / Assessment and Plan / ED Course  I have reviewed the triage vital signs and the nursing notes.  Pertinent labs & imaging results that were available during my care of the patient were reviewed by me and considered in my medical decision making (see chart for details).     57 yo F with a chief complaint of left arm pain and swelling.  This been going on for the past day or so.  She thinks she was bit by an insect.  On exam patient has some vesicular lesions in linear distribution along the left lateral arm.  I discussed the possibility of this being poison ivy with the family and they were a bit hesitant to feel that that was the cause.  She has been outside in the woods but they feel that she knows exactly what it looks like.  Shingles is another possibility and so I will treat her for that.  The family is concerned that she has recurrent cellulitis and so I will give her antibiotics though I think more likely that this is shingles, or contact dermatitis.   8:27 PM:  I have discussed the diagnosis/risks/treatment options with the patient and family and believe the pt to be eligible for discharge home to follow-up with PCP. We also discussed returning to the ED immediately if new or worsening sx occur. We discussed the sx which are most concerning (e.g., sudden worsening pain, fever, inability to tolerate by mouth) that necessitate immediate return. Medications administered to the patient during their visit and any new prescriptions provided to the patient are listed below.  Medications given during this visit Medications  predniSONE (DELTASONE) tablet 60 mg (has no administration in time range)  valACYclovir (VALTREX) tablet 1,000 mg (has no administration in time range)  doxycycline (VIBRA-TABS) tablet 100 mg (has  no administration in time range)    The patient appears reasonably screen and/or stabilized for discharge and I doubt any other medical condition or other [facility] requiring further screening, evaluation, or treatment in the ED at this time prior to discharge.   Final Clinical Impressions(s) / ED Diagnoses   Final diagnoses:  Herpes zoster without complication    ED Discharge Orders        Ordered    valACYclovir (VALTREX) 1000 MG tablet  3 times  daily     01/18/18 2015    doxycycline (VIBRAMYCIN) 100 MG capsule  2 times daily     01/18/18 2015    predniSONE (DELTASONE) 20 MG tablet     01/18/18 2015       Deno Etienne, DO 01/18/18 2027

## 2018-01-18 NOTE — ED Notes (Addendum)
While walking up the hallway pts family stopped me and ask "where is the nurse?" I advised her that her nurse was with a critical pt that was not breathing. Female party turned and walked away without speaking.

## 2018-01-18 NOTE — ED Notes (Signed)
Pt's family member came to Trauma [facility] as I was walking out of the room wanting to know who the nurse was.  I explained to her that I was the nurse and would be there in just one min.  She advised she didn't have a min. That they had been here since 1pm.  I explained to her that I was sorry, that I had been with a pt that wasn't breathing.  She replied "I don't give a shit about a pt not breathing I want another nurse.  Charge nurse made aware and this nurse removed her name from pt's chart

## 2018-01-18 NOTE — Discharge Instructions (Signed)
Follow up with your PCP.  Return for worsening symptoms.  °

## 2018-01-18 NOTE — ED Triage Notes (Signed)
Patient complains of recurrent left arm redness and swelling that started yesterday. Has hx of same and reports no lymph nodes in arm, pain better today than yesterday, no known trauma

## 2018-01-20 ENCOUNTER — Encounter: Payer: Federal, State, Local not specified - PPO | Admitting: Physical Therapy

## 2018-01-23 ENCOUNTER — Encounter: Payer: Federal, State, Local not specified - PPO | Admitting: Physical Therapy

## 2018-01-23 ENCOUNTER — Telehealth: Payer: Self-pay

## 2018-01-23 NOTE — Telephone Encounter (Signed)
Per Dr Jana Hakim, pt had a recent ER visit for skin issue with her arm, call and see how she is doing.    Outgoing call to pt, spouse [name] answered and we have permission to speak with her and due to lang barrier, [name] answered in regards to how pt is doing.  She reported pt is doing so much better, that she was treated for Shingles and verbalized medication has helped the area looks better. She said pt has upcoming appt and I confirmed in July.  She voiced understanding to call our office for any changes ie skin changes on her arm, medication not working, or fever. No other needs per pt or spouse at this time.  I notified Dr Jana Hakim of above call.

## 2018-01-25 ENCOUNTER — Encounter: Payer: Federal, State, Local not specified - PPO | Admitting: Physical Therapy

## 2018-01-30 ENCOUNTER — Other Ambulatory Visit: Payer: Self-pay | Admitting: Oncology

## 2018-01-30 ENCOUNTER — Encounter: Payer: Federal, State, Local not specified - PPO | Admitting: Physical Therapy

## 2018-02-01 ENCOUNTER — Encounter: Payer: Federal, State, Local not specified - PPO | Admitting: Physical Therapy

## 2018-02-08 ENCOUNTER — Encounter: Payer: Federal, State, Local not specified - PPO | Admitting: Physical Therapy

## 2018-02-10 ENCOUNTER — Encounter: Payer: Federal, State, Local not specified - PPO | Admitting: Physical Therapy

## 2018-03-29 NOTE — Progress Notes (Addendum)
Kelsey Mclaughlin  [phone] [phone]     ID: Kelsey Mclaughlin OB: [birth year]  MR#: [id number]  DIY#:[id number]  PCP: [name], MD GYN:  Jac Canavan MD SU: Donnie Mesa MD OTHER MD: Irene Limbo MD, Gwendolyn Grant MD, Chucky May MD  CHIEF COMPLAINT: Breast cancer, recurrent, estrogen receptor positive  CURRENT TREATMENT:  anastrozole  BREAST CANCER HISTORY: From doctor [name]'s intake note 07/68/0881:  "Kelsey Mclaughlin is a 57 y.o. female. In 1998 at the age of 46 was diagnosed with left breast cancer in [place]. This was an invasive carcinoma grade 2 stage II. At that time she underwent a lumpectomy with excellent lymph node dissection. She received 6 months of chemotherapy. [name] She also had radiation therapy to the left breast. 2011 patient noticed a lump in the outside of her left breast. She had ultrasound workup performed and was told it was negative no biopsies were performed. General 2014 after patient moved to the [place] she had a mammogram performed this revealed a possible mass in the outer quadrant of the left breast. Ultrasound showed it to be 1.7 cm. MRI of the breasts performed on January 30 revealed the mass to be 1.5 x 2.2 x 1.5 cm. Because of this she also had a biopsy performed on 10/04/2013. The biopsy revealed [[id number]) an invasive ductal carcinoma with ductal carcinoma in situ grade 2. Prognostic panel was positive for estrogen receptor 100% megestrol receptor +33% proliferation marker Ki-67 15% and the tumor was HER-2/neu positive" [with a signals ratio of 3.1 and number per cell 6.3]  The patient's subsequent history is as detailed below  INTERVAL HISTORY: Kelsey Mclaughlin returns today for follow-up and treatment of her estrogen receptor positive breast cancer accompanied by her spouse. She continues on anastrozole, with good tolerance.  She denies having hot flashes. She denies having issues with vaginal dryness.   Since her last visit, she underwent routine screening unilateral right mammography with CAD and tomography on 12/13/2017 at [facility] showing: breast density category B. There was no evidence of malignancy.   She completed a bone density on 11/24/2017 which showed a T score of  -1.6 osteopenia   REVIEW OF SYSTEMS: Kelsey Mclaughlin reports that she is feeling good. She has 2 horses that are pregnant. They are planning on selling one of the horses. She also spends a lot of time out in the farm and working in her garden.  1 of her sons who lives in [place] is coming to visit in August. He works with heart pacemakers. She denies unusual headaches, visual changes, nausea, vomiting, or dizziness. There has been no unusual cough, phlegm production, or pleurisy. This been no change in bowel or bladder habits. She denies unexplained fatigue or unexplained weight loss, bleeding, rash, or fever. A detailed review of systems was otherwise stable.    PAST MEDICAL HISTORY: Past Medical History:  Diagnosis Date  . Bronchitis    completed antibiotics 10/21/13/ states resolved  . Cancer ([place]) 1998, 2015   Left Breast  . Chronic diastolic (congestive) heart failure ([place])   . Lymph edema    lt arm  . Stroke [facility]) 2004   TIA-  no problems since    PAST SURGICAL HISTORY: Past Surgical History:  Procedure Laterality Date  . ABDOMINOPLASTY/PANNICULECTOMY    . BREAST IMPLANT EXCHANGE Left 09/03/2014   Procedure: LEFT REMOVAL TISSUE EXPANDERS WITH PLACEMENT OF  SILICONE IMPLANT ;  Surgeon: Irene Limbo, MD;  Location: [place];  Service: Plastics;  Laterality: Left;  . BREAST RECONSTRUCTION Left 03/07/2015   Procedure: LEFT NIPPLE AEROLA CREATION WITH LOCAL FLAP/FULL THICKNESS SKIN GRAFT FROM GROIN;  Surgeon: Irene Limbo, MD;  Location: [place];  Service: Plastics;  Laterality: Left;  . BREAST  SURGERY  1998   lumpectomy on left and removed lymphnodes in [place]  . LATISSIMUS FLAP TO BREAST Left 04/12/2014   Procedure: LEFT LATISSIMUS DORSI FLAP FOR LEFT BREAST RECONSTRUCTION AND PLACEMENT OF TISSUE EXPANDER;  Surgeon: Irene Limbo, MD;  Location: [place];  Service: Plastics;  Laterality: Left;  . LIPOSUCTION WITH LIPOFILLING Left 09/03/2014   Procedure:  LIPOFILLING TO LEFT CHEST  ;  Surgeon: Irene Limbo, MD;  Location: [place];  Service: Plastics;  Laterality: Left;  . LIPOSUCTION WITH LIPOFILLING Left 03/07/2015   Procedure: LIPOFILLING TO LEFT BREAST;  Surgeon: Irene Limbo, MD;  Location: [place];  Service: Plastics;  Laterality: Left;  Marland Kitchen MASTECTOMY Left 2015  . MASTOPEXY Right 09/03/2014   Procedure: RIGHT BREAST MASTOPEXY ;  Surgeon: Irene Limbo, MD;  Location: [place];  Service: Plastics;  Laterality: Right;  . PORT-A-CATH REMOVAL Right 12/24/2014   Procedure: REMOVAL PORT-A-CATH;  Surgeon: Donnie Mesa, MD;  Location: [facility];  Service: General;  Laterality: Right;  . PORTACATH PLACEMENT Right 10/25/2013   Procedure: INSERTION PORT-A-CATH;  Surgeon: Imogene Burn. Georgette Dover, MD;  Location: [facility];  Service: General;  Laterality: Right;  . SIMPLE MASTECTOMY WITH AXILLARY SENTINEL NODE BIOPSY Left 04/12/2014   Procedure: MASTECTOMY;  Surgeon: Imogene Burn. Georgette Dover, MD;  Location: [place];  Service: General;  Laterality: Left;  . TISSUE EXPANDER PLACEMENT Left 04/12/2014   Procedure: TISSUE EXPANDER;  Surgeon: Irene Limbo, MD;  Location: [place];  Service: Plastics;  Laterality: Left;  . TISSUE EXPANDER PLACEMENT Left 05/22/2014   Procedure: PLACEMENT OF TISSUE EXPANDER/I&D WASHOUT SEROMA;  Surgeon: Irene Limbo, MD;  Location: [place];  Service: Plastics;  Laterality: Left;    FAMILY HISTORY Family History  Problem Relation Age of Onset  . Breast cancer Mother 51  . Heart disease Father   . Heart  attack Father   . Breast cancer Maternal Grandmother 34  . Brain cancer Maternal Uncle 74       benign brain tumor  . Heart attack Paternal Aunt     GYNECOLOGIC HISTORY:   menarche age 54, first live birth age 89. The patient is GX P2. She went through the change of life approximately 2011.   SOCIAL HISTORY:  The patient worked previously as a Marine scientist. She has 2 children both of whom live in [place], works for the [employer], and [employer], a Librarian, academic. They are in their early 34s. There are no grandchildren. The patient married [name] (both women are originally from [place]) in [place]. [place] with 5 dogs, 40+ chickens and 2 horses. They're not church attenders    ADVANCED DIRECTIVES: in place   HEALTH MAINTENANCE: Social History   Tobacco Use  . Smoking status: Never Smoker  . Smokeless tobacco: Never Used  Substance Use Topics  . Alcohol use: Yes    Comment: 2 drinks/day  . Drug use: No     Colonoscopy:  PAP: May 2017    Bone density:  11/24/2017 showed a T score of  -1.6 osteopenia  Lipid panel:  Allergies  Allergen Reactions  . Adhesive [Tape] Rash    Band-aids    Current Outpatient Medications  Medication Sig Dispense Refill  . anastrozole (ARIMIDEX) 1 MG tablet TAKE 1 TABLET (1 MG TOTAL) BY MOUTH DAILY. 90 tablet 3  . doxycycline (VIBRAMYCIN) 100 MG capsule Take 1 capsule (100 mg total) by mouth 2 (two) times daily. 20 capsule 0  . predniSONE (DELTASONE) 20 MG tablet 2 tabs po daily x 4 days 8 tablet 0  . triamterene-hydrochlorothiazide (DYAZIDE) 37.5-25 MG capsule Take 1 capsule by mouth daily.    . valACYclovir (VALTREX) 1000 MG tablet Take 1 tablet (1,000 mg total) by mouth 3 (three) times daily. 21 tablet 0   No current facility-administered medications for this visit.     OBJECTIVE: middle-aged white Mclaughlin who appears well  Vitals:   03/30/18 1303  BP: 140/76  Pulse: (!) 57  Resp: 18  Temp: 97.8  F (36.6 C)  SpO2: 100%     Body mass index is 32.31 kg/m.    Filed Weights   03/30/18 1303  Weight: 182 lb 6.4 oz (82.7 kg)       ECOG FS:1 - Symptomatic but completely ambulatory   Sclerae unicteric, pupils round and equal Oropharynx clear and moist No cervical or supraclavicular adenopathy Lungs no rales or rhonchi Heart regular rate and rhythm Abd soft, nontender, positive bowel sounds MSK no focal spinal tenderness, grade 1 left upper extremity lymphedema; sleeve not in place Neuro: nonfocal, well oriented, appropriate affect Breasts: The right breast is status post reduction mammoplasty.  There are no findings of concern.  The left breast is status post mastectomy with implant and latissimus reconstruction.  The left lateral most aspect is tight and it compromises the range of motion of the left upper extremity; both axillae are benign  LAB RESULTS:  CMP     Component Value Date/Time   NA 138 03/30/2018 1255   NA 140 02/16/2017 1323   K 3.9 03/30/2018 1255   K 3.7 02/16/2017 1323   CL 105 03/30/2018 1255   CO2 25 03/30/2018 1255   CO2 26 02/16/2017 1323   GLUCOSE 99 03/30/2018 1255   GLUCOSE 113 02/16/2017 1323   BUN 13 03/30/2018 1255   BUN 12.8 02/16/2017 1323   CREATININE 0.68 03/30/2018 1255   CREATININE 0.8 02/16/2017 1323   CALCIUM 9.3 03/30/2018 1255   CALCIUM 9.7 02/16/2017 1323   PROT 6.8 03/30/2018 1255   PROT 6.9 02/16/2017 1323   ALBUMIN 4.0 03/30/2018 1255   ALBUMIN 4.1 02/16/2017 1323   AST 42 (H) 03/30/2018 1255   AST 34 02/16/2017 1323   ALT 78 (H) 03/30/2018 1255   ALT 58 (H) 02/16/2017 1323   ALKPHOS 82 03/30/2018 1255   ALKPHOS 107 02/16/2017 1323   BILITOT 0.3 03/30/2018 1255   BILITOT 0.28 02/16/2017 1323   GFRNONAA >60 03/30/2018 1255   GFRAA >60 03/30/2018 1255    I No results found for: SPEP  Lab Results  Component Value Date   WBC 5.8 03/30/2018   NEUTROABS 3.8 03/30/2018   HGB 12.6 03/30/2018   HCT 37.4 03/30/2018    MCV 92.4 03/30/2018   PLT 263 03/30/2018      Chemistry      Component Value Date/Time   NA 138 03/30/2018 1255   NA 140 02/16/2017 1323   K 3.9 03/30/2018 1255   K 3.7 02/16/2017 1323   CL 105 03/30/2018 1255   CO2 25 03/30/2018 1255  CO2 26 02/16/2017 1323   BUN 13 03/30/2018 1255   BUN 12.8 02/16/2017 1323   CREATININE 0.68 03/30/2018 1255   CREATININE 0.8 02/16/2017 1323      Component Value Date/Time   CALCIUM 9.3 03/30/2018 1255   CALCIUM 9.7 02/16/2017 1323   ALKPHOS 82 03/30/2018 1255   ALKPHOS 107 02/16/2017 1323   AST 42 (H) 03/30/2018 1255   AST 34 02/16/2017 1323   ALT 78 (H) 03/30/2018 1255   ALT 58 (H) 02/16/2017 1323   BILITOT 0.3 03/30/2018 1255   BILITOT 0.28 02/16/2017 1323       No results found for: LABCA2  No components found for: LABCA125  No results for input(s): INR in the last 168 hours.  Urinalysis    Component Value Date/Time   COLORURINE YELLOW 04/05/2016 2040   APPEARANCEUR CLEAR 04/05/2016 2040   LABSPEC 1.027 04/05/2016 2040   LABSPEC 1.005 05/22/2014 1130   PHURINE 7.0 04/05/2016 2040   GLUCOSEU NEGATIVE 04/05/2016 2040   GLUCOSEU Negative 05/22/2014 1130   HGBUR NEGATIVE 04/05/2016 2040   BILIRUBINUR NEGATIVE 04/05/2016 2040   BILIRUBINUR Negative 05/22/2014 1130   KETONESUR NEGATIVE 04/05/2016 2040   PROTEINUR 100 (A) 04/05/2016 2040   UROBILINOGEN 0.2 05/22/2014 1130   NITRITE NEGATIVE 04/05/2016 2040   LEUKOCYTESUR NEGATIVE 04/05/2016 2040   LEUKOCYTESUR Negative 05/22/2014 1130    STUDIES: Since her last visit, she underwent routine screening unilateral right mammography with CAD and tomography on 12/13/2017 at [facility] showing: breast density category B. There was no evidence of malignancy.   She completed a bone density on 11/24/2017 which showed a T score of  -1.6 osteopenia  ASSESSMENT: 57 y.o. BRCA negative Kelsey Mclaughlin, Kelsey Mclaughlin   (1) status post left lumpectomy and axillary lymph  node dissection in 1998 in [place] for a stage II invasive ductal carcinoma, grade 2, treated with adjuvant chemotherapy (specific drugs not clear) and adjuvant radiation.  (2) status post left breast upper outer quadrant biopsy 10/04/2013 for a clinically mT2 N0, stage IIA invasive ductal carcinoma, grade 2, estrogen receptor 100% positive, progesterone receptor 33% positive, with an MIB-1 of 15%, and HER-2 amplified; staging studies showed no evidence of metastatic disease  (3) started on neoadjuvant carboplatin/ docetaxel/ trastuzumab/ pertuzumab 11/12/2013  (a) completed 4 cycles as of 05/06/201, after which docetaxel was discontinued because of neuropathy symptoms   (b) gemcitabine was given day 1 and day 8 with carboplatin day 1 in the final 2 cycles, completed 03/04/2014   (c)  completed a year of trastuzumab March 2016--  Final echo 10/21/2014 shows a normal EF  (4) left mastectomy  04/12/2014 showed residual mpT1c pNX invasive ductal carcinoma, grade 2, estrogen and progesterone receptor positive, HER-2 negative, with negative margins  (5) anastrozole started 07/14/2014  (a) bone density 07/29/2014 shows T score - 1.2 (mild osteopenia)  (b) bone density 11/24/2017 showed a T score of  -1.6 osteopenia  (6) 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   PLAN: Kynsley is now 6-4/3 years out from definitive surgery for her breast cancer with no evidence of disease recurrence.  This is very favorable.  She is tolerating anastrozole well and the plan will be to continue that a minimum of 5 years, at which point we will decide whether she wants an additional 2 years of treatment.  Her bone density is only minimally changed.  She has a good vitamin D level.  Nevertheless I think it  would be prudent for her to take vitamin D 1000 units daily.  I agree that the scar from the left mastectomy is too tight and hopefully Dr. Iran Planas will be able to release  it to some extent.  I encouraged her to wear her left upper extremity sleeve as much as possible  I wished him luck on her upcoming foal.  She will see me again in 1 year.  She knows to call for any other issues that may develop before the next visit.    Jermarcus Mcfadyen, Virgie Dad, MD  03/30/18 1:40 PM Medical Oncology and Hematology [facility] [address] [place], [place] [address] Tel. [phone]    Fax. [phone]  Xianna Rieger, am acting as scribe for [name] MD.  I, Lurline Del MD, have reviewed the above documentation for accuracy and completeness, and I agree with the above.   ADDENDUM: She has multiple well-defined lesions particularly in the back which look benign to me.  In general though I do think she warrants dermatologic follow-up and this is being arranged

## 2018-03-30 ENCOUNTER — Telehealth: Payer: Self-pay | Admitting: Oncology

## 2018-03-30 ENCOUNTER — Inpatient Hospital Stay: Payer: Federal, State, Local not specified - PPO

## 2018-03-30 ENCOUNTER — Inpatient Hospital Stay: Payer: Federal, State, Local not specified - PPO | Attending: Oncology | Admitting: Oncology

## 2018-03-30 ENCOUNTER — Encounter: Payer: Self-pay | Admitting: Oncology

## 2018-03-30 VITALS — BP 140/76 | HR 57 | Temp 97.8°F | Resp 18 | Ht 63.0 in | Wt 182.4 lb

## 2018-03-30 DIAGNOSIS — L905 Scar conditions and fibrosis of skin: Secondary | ICD-10-CM | POA: Diagnosis not present

## 2018-03-30 DIAGNOSIS — C50912 Malignant neoplasm of unspecified site of left female breast: Secondary | ICD-10-CM

## 2018-03-30 DIAGNOSIS — Z853 Personal history of malignant neoplasm of breast: Secondary | ICD-10-CM | POA: Diagnosis not present

## 2018-03-30 DIAGNOSIS — Z170 Estrogen receptor positive status [ER+]: Secondary | ICD-10-CM | POA: Diagnosis not present

## 2018-03-30 DIAGNOSIS — M8580 Other specified disorders of bone density and structure, unspecified site: Secondary | ICD-10-CM | POA: Diagnosis not present

## 2018-03-30 DIAGNOSIS — Z9012 Acquired absence of left breast and nipple: Secondary | ICD-10-CM | POA: Diagnosis not present

## 2018-03-30 DIAGNOSIS — C50919 Malignant neoplasm of unspecified site of unspecified female breast: Secondary | ICD-10-CM

## 2018-03-30 DIAGNOSIS — C50412 Malignant neoplasm of upper-outer quadrant of left female breast: Secondary | ICD-10-CM

## 2018-03-30 DIAGNOSIS — Z923 Personal history of irradiation: Secondary | ICD-10-CM | POA: Diagnosis not present

## 2018-03-30 LAB — CBC WITH DIFFERENTIAL/PLATELET
BASOS ABS: 0 10*3/uL (ref 0.0–0.1)
BASOS PCT: 1 %
EOS PCT: 3 %
Eosinophils Absolute: 0.2 10*3/uL (ref 0.0–0.5)
HCT: 37.4 % (ref 34.8–46.6)
Hemoglobin: 12.6 g/dL (ref 11.6–15.9)
Lymphocytes Relative: 21 %
Lymphs Abs: 1.2 10*3/uL (ref 0.9–3.3)
MCH: 31.1 pg (ref 25.1–34.0)
MCHC: 33.6 g/dL (ref 31.5–36.0)
MCV: 92.4 fL (ref 79.5–101.0)
MONO ABS: 0.6 10*3/uL (ref 0.1–0.9)
Monocytes Relative: 11 %
Neutro Abs: 3.8 10*3/uL (ref 1.5–6.5)
Neutrophils Relative %: 64 %
PLATELETS: 263 10*3/uL (ref 145–400)
RBC: 4.05 MIL/uL (ref 3.70–5.45)
RDW: 13.4 % (ref 11.2–14.5)
WBC: 5.8 10*3/uL (ref 3.9–10.3)

## 2018-03-30 LAB — COMPREHENSIVE METABOLIC PANEL
ALBUMIN: 4 g/dL (ref 3.5–5.0)
ALT: 78 U/L — ABNORMAL HIGH (ref 0–44)
AST: 42 U/L — AB (ref 15–41)
Alkaline Phosphatase: 82 U/L (ref 38–126)
Anion gap: 8 (ref 5–15)
BUN: 13 mg/dL (ref 6–20)
CHLORIDE: 105 mmol/L (ref 98–111)
CO2: 25 mmol/L (ref 22–32)
Calcium: 9.3 mg/dL (ref 8.9–10.3)
Creatinine, Ser: 0.68 mg/dL (ref 0.44–1.00)
GFR calc Af Amer: >60 mL/min (ref >60)
GFR calc non Af Amer: >60 mL/min (ref >60)
GLUCOSE: 99 mg/dL (ref 70–99)
Potassium: 3.9 mmol/L (ref 3.5–5.1)
SODIUM: 138 mmol/L (ref 135–145)
Total Bilirubin: 0.3 mg/dL (ref 0.3–1.2)
Total Protein: 6.8 g/dL (ref 6.5–8.1)

## 2018-03-30 MED ORDER — VITAMIN D 1000 UNITS PO TABS: 1000.0000 [IU] | ORAL_TABLET | Freq: Every day | ORAL | 4 refills | Status: DC

## 2018-03-30 NOTE — Telephone Encounter (Signed)
Gave patient avs and calendar.   °

## 2018-03-30 NOTE — Addendum Note (Signed)
Addended by: [name] on: 03/30/2018 02:17 PM   Modules accepted: Orders

## 2018-04-13 ENCOUNTER — Telehealth: Payer: Self-pay | Admitting: Oncology

## 2018-04-13 NOTE — Telephone Encounter (Signed)
Dermatology Referral added to proficient per 7/18 sch msg

## 2018-05-22 ENCOUNTER — Other Ambulatory Visit: Payer: Self-pay

## 2018-05-22 ENCOUNTER — Encounter (HOSPITAL_BASED_OUTPATIENT_CLINIC_OR_DEPARTMENT_OTHER): Payer: Self-pay

## 2018-05-29 ENCOUNTER — Encounter (HOSPITAL_BASED_OUTPATIENT_CLINIC_OR_DEPARTMENT_OTHER)
Admission: RE | Admit: 2018-05-29 | Discharge: 2018-05-29 | Disposition: A | Discharge status: Home / Self Care | Payer: Federal, State, Local not specified - PPO | Source: Ambulatory Visit | Attending: Plastic Surgery | Admitting: Plastic Surgery

## 2018-05-29 DIAGNOSIS — I10 Essential (primary) hypertension: Secondary | ICD-10-CM | POA: Insufficient documentation

## 2018-05-29 DIAGNOSIS — L905 Scar conditions and fibrosis of skin: Secondary | ICD-10-CM | POA: Diagnosis not present

## 2018-05-29 DIAGNOSIS — Z8673 Personal history of transient ischemic attack (TIA), and cerebral infarction without residual deficits: Secondary | ICD-10-CM | POA: Diagnosis not present

## 2018-05-29 DIAGNOSIS — R9431 Abnormal electrocardiogram [ECG] [EKG]: Secondary | ICD-10-CM | POA: Diagnosis not present

## 2018-05-29 DIAGNOSIS — I509 Heart failure, unspecified: Secondary | ICD-10-CM | POA: Diagnosis not present

## 2018-05-29 DIAGNOSIS — Z9221 Personal history of antineoplastic chemotherapy: Secondary | ICD-10-CM | POA: Diagnosis not present

## 2018-05-29 DIAGNOSIS — Z01810 Encounter for preprocedural cardiovascular examination: Secondary | ICD-10-CM | POA: Diagnosis not present

## 2018-05-29 DIAGNOSIS — I110 Hypertensive heart disease with heart failure: Secondary | ICD-10-CM | POA: Diagnosis not present

## 2018-05-29 DIAGNOSIS — Z923 Personal history of irradiation: Secondary | ICD-10-CM | POA: Diagnosis not present

## 2018-05-29 DIAGNOSIS — Z853 Personal history of malignant neoplasm of breast: Secondary | ICD-10-CM | POA: Diagnosis not present

## 2018-05-29 LAB — BASIC METABOLIC PANEL
Anion gap: 11 (ref 5–15)
BUN: 16 mg/dL (ref 6–20)
CO2: 25 mmol/L (ref 22–32)
Calcium: 9.7 mg/dL (ref 8.9–10.3)
Chloride: 106 mmol/L (ref 98–111)
Creatinine, Ser: 0.65 mg/dL (ref 0.44–1.00)
GFR calc Af Amer: >60 mL/min (ref >60)
GFR calc non Af Amer: >60 mL/min (ref >60)
Glucose, Bld: 111 mg/dL — ABNORMAL HIGH (ref 70–99)
POTASSIUM: 4 mmol/L (ref 3.5–5.1)
SODIUM: 142 mmol/L (ref 135–145)

## 2018-05-29 NOTE — Progress Notes (Signed)
Verified EKG okay with Dr. Marcell Barlow.  Okay to proceed with surgery as scheduled.

## 2018-05-30 ENCOUNTER — Ambulatory Visit (HOSPITAL_BASED_OUTPATIENT_CLINIC_OR_DEPARTMENT_OTHER): Payer: Federal, State, Local not specified - PPO | Admitting: Anesthesiology

## 2018-05-30 ENCOUNTER — Encounter (HOSPITAL_BASED_OUTPATIENT_CLINIC_OR_DEPARTMENT_OTHER): Payer: Self-pay | Admitting: Anesthesiology

## 2018-05-30 ENCOUNTER — Ambulatory Visit (HOSPITAL_BASED_OUTPATIENT_CLINIC_OR_DEPARTMENT_OTHER)
Admission: RE | Admit: 2018-05-30 | Discharge: 2018-05-30 | Disposition: A | Discharge status: Home / Self Care | Payer: Federal, State, Local not specified - PPO | Source: Ambulatory Visit | Attending: Plastic Surgery | Admitting: Plastic Surgery

## 2018-05-30 ENCOUNTER — Encounter (HOSPITAL_BASED_OUTPATIENT_CLINIC_OR_DEPARTMENT_OTHER)
Admission: RE | Disposition: A | Discharge status: Home / Self Care | Payer: Self-pay | Source: Ambulatory Visit | Attending: Plastic Surgery

## 2018-05-30 ENCOUNTER — Other Ambulatory Visit: Payer: Self-pay

## 2018-05-30 DIAGNOSIS — Z923 Personal history of irradiation: Secondary | ICD-10-CM | POA: Diagnosis not present

## 2018-05-30 DIAGNOSIS — Z9221 Personal history of antineoplastic chemotherapy: Secondary | ICD-10-CM | POA: Diagnosis not present

## 2018-05-30 DIAGNOSIS — I509 Heart failure, unspecified: Secondary | ICD-10-CM | POA: Diagnosis not present

## 2018-05-30 DIAGNOSIS — I110 Hypertensive heart disease with heart failure: Secondary | ICD-10-CM | POA: Insufficient documentation

## 2018-05-30 DIAGNOSIS — Z853 Personal history of malignant neoplasm of breast: Secondary | ICD-10-CM | POA: Diagnosis not present

## 2018-05-30 DIAGNOSIS — L905 Scar conditions and fibrosis of skin: Secondary | ICD-10-CM | POA: Insufficient documentation

## 2018-05-30 DIAGNOSIS — Z8673 Personal history of transient ischemic attack (TIA), and cerebral infarction without residual deficits: Secondary | ICD-10-CM | POA: Diagnosis not present

## 2018-05-30 HISTORY — PX: HYDRADENITIS EXCISION: SHX5243

## 2018-05-30 SURGERY — EXCISION, HIDRADENITIS, AXILLA
Anesthesia: General | Site: Axilla | Laterality: Left

## 2018-05-30 MED ORDER — LIDOCAINE 2% (20 MG/ML) 5 ML SYRINGE
INTRAMUSCULAR | Status: AC
  Filled 2018-05-30: qty 5

## 2018-05-30 MED ORDER — BACITRACIN-NEOMYCIN-POLYMYXIN 400-5-5000 EX OINT
TOPICAL_OINTMENT | CUTANEOUS | Status: AC
  Filled 2018-05-30: qty 1

## 2018-05-30 MED ORDER — SCOPOLAMINE 1 MG/3DAYS TD PT72
MEDICATED_PATCH | TRANSDERMAL | Status: AC
  Filled 2018-05-30: qty 1

## 2018-05-30 MED ORDER — MEPERIDINE HCL 25 MG/ML IJ SOLN: 6.2500 mg | INTRAMUSCULAR | Status: DC | PRN

## 2018-05-30 MED ORDER — CEFAZOLIN SODIUM-DEXTROSE 2-4 GM/100ML-% IV SOLN
INTRAVENOUS | Status: AC
  Filled 2018-05-30: qty 100

## 2018-05-30 MED ORDER — MIDAZOLAM HCL 2 MG/2ML IJ SOLN
1.0000 mg | INTRAMUSCULAR | Status: DC | PRN
  Administered 2018-05-30: 2 mg via INTRAVENOUS

## 2018-05-30 MED ORDER — BUPIVACAINE-EPINEPHRINE (PF) 0.25% -1:200000 IJ SOLN
INTRAMUSCULAR | Status: AC
  Filled 2018-05-30: qty 30

## 2018-05-30 MED ORDER — ONDANSETRON HCL 4 MG/2ML IJ SOLN
INTRAMUSCULAR | Status: DC | PRN
  Administered 2018-05-30: 4 mg via INTRAVENOUS

## 2018-05-30 MED ORDER — BACITRACIN-NEOMYCIN-POLYMYXIN OINTMENT TUBE
TOPICAL_OINTMENT | CUTANEOUS | Status: DC | PRN
  Administered 2018-05-30: 1 via TOPICAL

## 2018-05-30 MED ORDER — 0.9 % SODIUM CHLORIDE (POUR BTL) OPTIME
TOPICAL | Status: DC | PRN
  Administered 2018-05-30: 100 mL

## 2018-05-30 MED ORDER — CEFAZOLIN SODIUM-DEXTROSE 2-4 GM/100ML-% IV SOLN
2.0000 g | INTRAVENOUS | Status: AC
  Administered 2018-05-30: 2 g via INTRAVENOUS

## 2018-05-30 MED ORDER — LIDOCAINE HCL (CARDIAC) PF 100 MG/5ML IV SOSY
PREFILLED_SYRINGE | INTRAVENOUS | Status: DC | PRN
  Administered 2018-05-30: 80 mg via INTRAVENOUS

## 2018-05-30 MED ORDER — DEXAMETHASONE SODIUM PHOSPHATE 10 MG/ML IJ SOLN
INTRAMUSCULAR | Status: AC
  Filled 2018-05-30: qty 1

## 2018-05-30 MED ORDER — DEXAMETHASONE SODIUM PHOSPHATE 4 MG/ML IJ SOLN
INTRAMUSCULAR | Status: DC | PRN
  Administered 2018-05-30: 10 mg via INTRAVENOUS

## 2018-05-30 MED ORDER — LIDOCAINE-EPINEPHRINE 1 %-1:100000 IJ SOLN
INTRAMUSCULAR | Status: AC
  Filled 2018-05-30: qty 1

## 2018-05-30 MED ORDER — MIDAZOLAM HCL 2 MG/2ML IJ SOLN
INTRAMUSCULAR | Status: AC
  Filled 2018-05-30: qty 2

## 2018-05-30 MED ORDER — HYDROCODONE-ACETAMINOPHEN 5-325 MG PO TABS: 1.0000 | ORAL_TABLET | ORAL | 0 refills | Status: DC | PRN

## 2018-05-30 MED ORDER — FENTANYL CITRATE (PF) 100 MCG/2ML IJ SOLN
INTRAMUSCULAR | Status: AC
  Filled 2018-05-30: qty 2

## 2018-05-30 MED ORDER — PROPOFOL 10 MG/ML IV BOLUS
INTRAVENOUS | Status: DC | PRN
  Administered 2018-05-30: 150 mg via INTRAVENOUS

## 2018-05-30 MED ORDER — PROPOFOL 500 MG/50ML IV EMUL
INTRAVENOUS | Status: AC
  Filled 2018-05-30: qty 50

## 2018-05-30 MED ORDER — FENTANYL CITRATE (PF) 100 MCG/2ML IJ SOLN: 25.0000 ug | INTRAMUSCULAR | Status: DC | PRN

## 2018-05-30 MED ORDER — BACITRACIN ZINC 500 UNIT/GM EX OINT
TOPICAL_OINTMENT | CUTANEOUS | Status: AC
  Filled 2018-05-30: qty 0.9

## 2018-05-30 MED ORDER — METOCLOPRAMIDE HCL 5 MG/ML IJ SOLN: 10.0000 mg | Freq: Once | INTRAMUSCULAR | Status: DC | PRN

## 2018-05-30 MED ORDER — HYDROCODONE-ACETAMINOPHEN 7.5-325 MG PO TABS: 1.0000 | ORAL_TABLET | Freq: Once | ORAL | Status: DC | PRN

## 2018-05-30 MED ORDER — ONDANSETRON HCL 4 MG/2ML IJ SOLN
INTRAMUSCULAR | Status: AC
  Filled 2018-05-30: qty 2

## 2018-05-30 MED ORDER — SCOPOLAMINE 1 MG/3DAYS TD PT72
1.0000 | MEDICATED_PATCH | Freq: Once | TRANSDERMAL | Status: AC | PRN
  Administered 2018-05-30: 1 via TRANSDERMAL

## 2018-05-30 MED ORDER — LACTATED RINGERS IV SOLN
INTRAVENOUS | Status: DC
  Administered 2018-05-30 (×2): via INTRAVENOUS

## 2018-05-30 MED ORDER — BUPIVACAINE-EPINEPHRINE 0.25% -1:200000 IJ SOLN
INTRAMUSCULAR | Status: DC | PRN
  Administered 2018-05-30: 15 mL

## 2018-05-30 MED ORDER — FENTANYL CITRATE (PF) 100 MCG/2ML IJ SOLN
50.0000 ug | INTRAMUSCULAR | Status: DC | PRN
  Administered 2018-05-30 (×2): 50 ug via INTRAVENOUS

## 2018-05-30 SURGICAL SUPPLY — 45 items
BENZOIN TINCTURE PRP APPL 2/3 (GAUZE/BANDAGES/DRESSINGS) IMPLANT
BLADE CLIPPER SURG (BLADE) IMPLANT
BLADE SURG 10 STRL SS (BLADE) ×2 IMPLANT
BLADE SURG 15 STRL LF DISP TIS (BLADE) IMPLANT
BLADE SURG 15 STRL SS (BLADE)
CANISTER SUCT 1200ML W/VALVE (MISCELLANEOUS) IMPLANT
CHLORAPREP W/TINT 26ML (MISCELLANEOUS) ×2 IMPLANT
COVER BACK TABLE 60X90IN (DRAPES) ×2 IMPLANT
COVER MAYO STAND STRL (DRAPES) ×2 IMPLANT
DERMABOND ADVANCED (GAUZE/BANDAGES/DRESSINGS)
DERMABOND ADVANCED .7 DNX12 (GAUZE/BANDAGES/DRESSINGS) IMPLANT
DRAIN JP 10F RND SILICONE (MISCELLANEOUS) IMPLANT
DRAPE LAPAROSCOPIC ABDOMINAL (DRAPES) IMPLANT
DRAPE U-SHAPE 76X120 STRL (DRAPES) ×2 IMPLANT
ELECT COATED BLADE 2.86 ST (ELECTRODE) ×2 IMPLANT
ELECT REM PT RETURN 9FT ADLT (ELECTROSURGICAL) ×2
ELECTRODE REM PT RTRN 9FT ADLT (ELECTROSURGICAL) ×1 IMPLANT
EVACUATOR SILICONE 100CC (DRAIN) IMPLANT
GAUZE SPONGE 4X4 12PLY STRL LF (GAUZE/BANDAGES/DRESSINGS) IMPLANT
GAUZE XEROFORM 1X8 LF (GAUZE/BANDAGES/DRESSINGS) IMPLANT
GLOVE BIO SURGEON STRL SZ 6 (GLOVE) ×2 IMPLANT
GLOVE BIOGEL PI IND STRL 7.0 (GLOVE) ×2 IMPLANT
GLOVE BIOGEL PI INDICATOR 7.0 (GLOVE) ×2
GLOVE ECLIPSE 6.5 STRL STRAW (GLOVE) ×2 IMPLANT
GOWN STRL REUS W/ TWL LRG LVL3 (GOWN DISPOSABLE) ×2 IMPLANT
GOWN STRL REUS W/TWL LRG LVL3 (GOWN DISPOSABLE) ×2
NEEDLE PRECISIONGLIDE 27X1.5 (NEEDLE) ×2 IMPLANT
NS IRRIG 1000ML POUR BTL (IV SOLUTION) ×2 IMPLANT
PACK BASIN DAY SURGERY FS (CUSTOM PROCEDURE TRAY) ×2 IMPLANT
PENCIL BUTTON HOLSTER BLD 10FT (ELECTRODE) ×2 IMPLANT
SLEEVE SCD COMPRESS KNEE MED (MISCELLANEOUS) ×2 IMPLANT
SPONGE LAP 18X18 RF (DISPOSABLE) ×2 IMPLANT
STRIP CLOSURE SKIN 1/2X4 (GAUZE/BANDAGES/DRESSINGS) IMPLANT
SUT ETHILON 4 0 PS 2 18 (SUTURE) ×2 IMPLANT
SUT MNCRL AB 3-0 PS2 18 (SUTURE) ×2 IMPLANT
SUT MNCRL AB 4-0 PS2 18 (SUTURE) ×2 IMPLANT
SUT VICRYL 4-0 PS2 18IN ABS (SUTURE) IMPLANT
SWAB COLLECTION DEVICE MRSA (MISCELLANEOUS) IMPLANT
SWAB CULTURE ESWAB REG 1ML (MISCELLANEOUS) IMPLANT
SYR BULB 3OZ (MISCELLANEOUS) IMPLANT
SYR CONTROL 10ML LL (SYRINGE) ×2 IMPLANT
TOWEL GREEN STERILE FF (TOWEL DISPOSABLE) ×4 IMPLANT
TRAY DSU PREP LF (CUSTOM PROCEDURE TRAY) IMPLANT
TUBE CONNECTING 20X1/4 (TUBING) IMPLANT
YANKAUER SUCT BULB TIP NO VENT (SUCTIONS) IMPLANT

## 2018-05-30 NOTE — H&P (Signed)
Subjective:     Patient ID: Kelsey Mclaughlin is a 57 y.o. female.   4 years post op from latissimus/TE reconstruction at time of mastectomy for recurrent cancer. Course complicated by seroma and return to OR for drainage and implant exchange. Here for follow up left axillary scar. Referred to PT- report they went to one visit, felt it was lymphedema management and they have pump at home and did not continue with this. Would like to proceed with surgery.  Notes frequent bleeding from left axillary scar over last few months, reports will break open then heal. Had episode throbbing left breast last week, then improved on own.   Final pathology with two foci IDC 1.3 and 1.0 cm, margins negative. +lymphovascular invasion. Plan for AI as adjuvant treatment. The patient was originally diagnosed at age 84 with IDC in the left UOQ. The patient underwent lumpectomy and full axillary lymph node dissection with ALND, XRT, chemotherapy in [place]. 2/26 lymph nodes positive  Screening MMG in December 2014 demonstrated breast asymmetry and follow up biospy demonstrated mass measuring 1 x 0.4 x 1.7 cm 2 cm from the nipple. , IDC, ER/PR +, Her 2 +. She completed neoadjuvant chemotherapy. 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 She has lymphedema of LUE and uses sleeve and compression pump.   Prior bra C cup on larger R breast side. MMG 12/2017 normal.      Objective:   Physical Exam  Cardiovascular: Normal rate, regular rhythm and normal heart sounds.   Pulmonary/Chest: Effort normal and breath sounds normal.    Right pseudoptosis Left reconstructed breast : baker 2, unchanged left upper pole depression and LD skin paddle fuller contour/projecting No masses bilateral No axillary adenopathy Left axillary scar from prior lumpectomy- tight with full abduction Assessment:      Post op implant exchange, lipofilling and right mastopexy Post mastectomy with latissimus/TE reconstruction Recurrent left breast  cancer Scar contracture left axilla   Plan:    Plan Z plasties to break up left axillary scar. Discussed risks infection, wound healing issues, flap necrosis, recurrent contracture. Discussed changed alternating Z appearance scar. Plan OP surgery, no drains anticipated.  140 ml fat infiltrated left reconstructed breast  Mentor Smooth Ultra High Profile 106 ml silicone implant placed in left chest.   Irene Limbo, MD [facility] Plastic & Reconstructive Surgery [phone], pin [phone]

## 2018-05-30 NOTE — Anesthesia Procedure Notes (Signed)
Procedure Name: LMA Insertion Date/Time: 05/30/2018 7:25 AM Performed by: Maryella Shivers, CRNA Pre-anesthesia Checklist: Patient identified, Emergency Drugs available, Suction available and Patient being monitored Patient Re-evaluated:Patient Re-evaluated prior to induction Oxygen Delivery Method: Circle system utilized Preoxygenation: Pre-oxygenation with 100% oxygen Induction Type: IV induction Ventilation: Mask ventilation without difficulty LMA: LMA inserted LMA Size: 4.0 Number of attempts: 1 Airway Equipment and Method: Bite block Placement Confirmation: positive ETCO2 Tube secured with: Tape Dental Injury: Teeth and Oropharynx as per pre-operative assessment

## 2018-05-30 NOTE — Anesthesia Postprocedure Evaluation (Signed)
Anesthesia Post Note  Patient: Kelsey Mclaughlin  Procedure(s) Performed: Adjacent tissue transfer left axilla (Left Axilla)     Patient location during evaluation: PACU Anesthesia Type: General Level of consciousness: awake and alert and oriented Pain management: pain level controlled Vital Signs Assessment: post-procedure vital signs reviewed and stable Respiratory status: spontaneous breathing, nonlabored ventilation and respiratory function stable Cardiovascular status: blood pressure returned to baseline and stable Postop Assessment: no apparent nausea or vomiting Anesthetic complications: no    Last Vitals:  Vitals:   05/30/18 0830 05/30/18 1023  BP: 122/62 115/75  Pulse: 74 66  Resp: 19 16  Temp:  36.6 C  SpO2: 96% 100%    Last Pain:  Vitals:   05/30/18 1023  TempSrc:   PainSc: 0-No pain                 [name]

## 2018-05-30 NOTE — Op Note (Signed)
Operative Note   DATE OF OPERATION: 9.17.19  LOCATION: [facility]-outpatient  SURGICAL DIVISION: Plastic Surgery  PREOPERATIVE DIAGNOSES:  1. Scar contracture left axilla 2. History breast cancer 3. History therapeutic radiation  POSTOPERATIVE DIAGNOSES:  same  PROCEDURE:  Adjacent tissue transfer left axilla 21 cm2  SURGEON: Irene Limbo MD MBA  ASSISTANT: none  ANESTHESIA:  General.   EBL: 10 ml  COMPLICATIONS: None immediate.   INDICATIONS FOR PROCEDURE:  The patient, Kelsey Mclaughlin, is a 57 y.o. female born on 1961/01/06, is here for treatment scar contractuer left axilla from prior lumpectomy, ALND.   FINDINGS: Alternating Z plasties of left axillary scar completed.  DESCRIPTION OF PROCEDURE:  The patient's operative site was marked with the patient in the preoperative area. The patient was taken to the operating room. SCDs were placed and IV antibiotics were given. The patient's operative site was prepped and draped in a sterile fashion. A time out was performed and all information was confirmed to be correct.  Local anesthetic infiltrated surrounding scar and planned flaps. Alternating Z plasties with approximately 60 degree angle designed adjacent to scar. Scar excised full thickness. Skin flaps incised and elevated in subfascial plane. Flaps inset with interrupted 3-0 and 4-0 monocryl in dermis. Skin closure completed with short running 4-0 nylon suture. Total area 21 cm2. Antibiotic ointment applied followed by dry dressing.   The patient was allowed to wake from anesthesia, extubated and taken to the recovery room in satisfactory condition.   SPECIMENS: none  DRAINS: none  Irene Limbo, MD [facility] Plastic & Reconstructive Surgery [phone], pin [phone]

## 2018-05-30 NOTE — Discharge Instructions (Signed)

## 2018-05-30 NOTE — Anesthesia Postprocedure Evaluation (Signed)
Anesthesia Post Note  Patient: Kelsey Mclaughlin  Procedure(s) Performed: Adjacent tissue transfer left axilla (Left Axilla)     Patient location during evaluation: PACU Anesthesia Type: General Level of consciousness: awake and alert and oriented Pain management: pain level controlled Vital Signs Assessment: post-procedure vital signs reviewed and stable Respiratory status: spontaneous breathing, nonlabored ventilation and respiratory function stable Cardiovascular status: blood pressure returned to baseline and stable Postop Assessment: no apparent nausea or vomiting Anesthetic complications: no    Last Vitals:  Vitals:   05/30/18 0815 05/30/18 0830  BP: 123/81 122/62  Pulse: 89 74  Resp: 16 19  Temp: 36.6 C   SpO2: 100% 96%    Last Pain:  Vitals:   05/30/18 0830  TempSrc:   PainSc: 0-No pain                 Tytan Sandate A.

## 2018-05-30 NOTE — Anesthesia Preprocedure Evaluation (Signed)
Anesthesia Evaluation  Patient identified by MRN, date of birth, ID band Patient awake    Reviewed: Allergy & Precautions, NPO status , Patient's Chart, lab work & pertinent test results  Airway Mallampati: II  TM Distance: >3 FB Neck ROM: Full    Dental  (+) Teeth Intact   Pulmonary neg pulmonary ROS,    Pulmonary exam normal breath sounds clear to auscultation       Cardiovascular hypertension, Pt. on medications +CHF  Normal cardiovascular exam Rhythm:Regular Rate:Normal     Neuro/Psych PSYCHIATRIC DISORDERS Depression TIANo Residual Symptoms    GI/Hepatic negative GI ROS, Neg liver ROS,   Endo/Other  Hx/o Left Breast Ca  Renal/GU negative Renal ROS  negative genitourinary   Musculoskeletal Scar contracture left axilla   Abdominal   Peds  Hematology negative hematology ROS (+)   Anesthesia Other Findings   Reproductive/Obstetrics                             Anesthesia Physical Anesthesia Plan  ASA: III  Anesthesia Plan: General   Post-op Pain Management:    Induction: Intravenous  PONV Risk Score and Plan: 4 or greater and Ondansetron, Dexamethasone, Treatment may vary due to age or medical condition, Scopolamine patch - Pre-op and Midazolam  Airway Management Planned: LMA  Additional Equipment:   Intra-op Plan:   Post-operative Plan: Extubation in OR  Informed Consent: I have reviewed the patients History and Physical, chart, labs and discussed the procedure including the risks, benefits and alternatives for the proposed anesthesia with the patient or authorized representative who has indicated his/her understanding and acceptance.   Dental advisory given  Plan Discussed with: CRNA and Surgeon  Anesthesia Plan Comments:         Anesthesia Quick Evaluation

## 2018-05-30 NOTE — Transfer of Care (Signed)
Immediate Anesthesia Transfer of Care Note  Patient: Kelsey Mclaughlin  Procedure(s) Performed: Adjacent tissue transfer left axilla (Left Axilla)  Patient Location: PACU  Anesthesia Type:General  Level of Consciousness: sedated  Airway & Oxygen Therapy: Patient Spontanous Breathing and Patient connected to face mask oxygen  Post-op Assessment: Report given to RN and Post -op Vital signs reviewed and stable  Post vital signs: Reviewed and stable  Last Vitals:  Vitals Value Taken Time  BP 123/81 05/30/2018  8:15 AM  Temp    Pulse 95 05/30/2018  8:17 AM  Resp 19 05/30/2018  8:17 AM  SpO2 100 % 05/30/2018  8:17 AM  Vitals shown include unvalidated device data.  Last Pain:  Vitals:   05/30/18 0643  TempSrc: Oral      Patients Stated Pain Goal: 0 (76/14/70 9295)  Complications: No apparent anesthesia complications

## 2018-05-31 ENCOUNTER — Encounter (HOSPITAL_BASED_OUTPATIENT_CLINIC_OR_DEPARTMENT_OTHER): Payer: Self-pay | Admitting: Plastic Surgery

## 2018-06-16 DIAGNOSIS — I10 Essential (primary) hypertension: Secondary | ICD-10-CM | POA: Diagnosis not present

## 2018-07-05 DIAGNOSIS — K080 Exfoliation of teeth due to systemic causes: Secondary | ICD-10-CM | POA: Diagnosis not present

## 2018-09-12 DIAGNOSIS — J029 Acute pharyngitis, unspecified: Secondary | ICD-10-CM | POA: Diagnosis not present

## 2018-10-05 ENCOUNTER — Other Ambulatory Visit: Payer: Self-pay | Admitting: Oncology

## 2018-10-05 ENCOUNTER — Other Ambulatory Visit: Payer: Self-pay | Admitting: *Deleted

## 2018-10-05 ENCOUNTER — Telehealth: Payer: Self-pay | Admitting: *Deleted

## 2018-10-05 DIAGNOSIS — Z170 Estrogen receptor positive status [ER+]: Principal | ICD-10-CM

## 2018-10-05 DIAGNOSIS — C50412 Malignant neoplasm of upper-outer quadrant of left female breast: Secondary | ICD-10-CM

## 2018-10-05 DIAGNOSIS — Z1231 Encounter for screening mammogram for malignant neoplasm of breast: Secondary | ICD-10-CM

## 2018-10-05 NOTE — Telephone Encounter (Signed)
This RN returned call to pt per VM - spoke with pt's spouse, Kelsey Mclaughlin - who states they were calling the [facility] to schedule pt's Right yearly annual mammogram - " and when the person at the [facility] started asking questions - she asked if Kelsey Mclaughlin is having pain in her breast and Kelsey Mclaughlin said yes - so the [facility] said she couldn't schedule a screening mammogram but would need to see the doctor and will need an order from the MD for a diagnostic mammogram"  Above discussed with this RN informing Kelsey Mclaughlin that an order for a diagnostic mammogram be placed and then if needed can be seen.  Pt and Kelsey Mclaughlin is in agreement to above plan.  This RN entered order.

## 2018-10-09 ENCOUNTER — Encounter: Payer: Self-pay | Admitting: *Deleted

## 2018-10-09 ENCOUNTER — Ambulatory Visit: Payer: Federal, State, Local not specified - PPO | Admitting: Podiatry

## 2018-10-09 ENCOUNTER — Ambulatory Visit (INDEPENDENT_AMBULATORY_CARE_PROVIDER_SITE_OTHER): Payer: Federal, State, Local not specified - PPO

## 2018-10-09 DIAGNOSIS — M21612 Bunion of left foot: Secondary | ICD-10-CM

## 2018-10-09 DIAGNOSIS — M21611 Bunion of right foot: Secondary | ICD-10-CM

## 2018-10-09 DIAGNOSIS — M21619 Bunion of unspecified foot: Secondary | ICD-10-CM

## 2018-10-09 DIAGNOSIS — I10 Essential (primary) hypertension: Secondary | ICD-10-CM | POA: Insufficient documentation

## 2018-10-09 NOTE — Patient Instructions (Signed)
Pre-Operative Instructions  Congratulations, you have decided to take an important step towards improving your quality of life.  You can be assured that the doctors and staff at [facility] will be with you every step of the way.  Here are some important things you should know:  1. Plan to be at the surgery center/hospital at least 1 (one) hour prior to your scheduled time, unless otherwise directed by the surgical center/hospital staff.  You must have a responsible adult accompany you, remain during the surgery and drive you home.  Make sure you have directions to the surgical center/hospital to ensure you arrive on time. 2. If you are having surgery at [facility] or [facility], you will need a copy of your medical history and physical form from your family physician within one month prior to the date of surgery. We will give you a form for your primary physician to complete.  3. We make every effort to accommodate the date you request for surgery.  However, there are times where surgery dates or times have to be moved.  We will contact you as soon as possible if a change in schedule is required.   4. No aspirin/ibuprofen for one week before surgery.  If you are on aspirin, any non-steroidal anti-inflammatory medications (Mobic, Aleve, Ibuprofen) should not be taken seven (7) days prior to your surgery.  You make take Tylenol for pain prior to surgery.  5. Medications - If you are taking daily heart and blood pressure medications, seizure, reflux, allergy, asthma, anxiety, pain or diabetes medications, make sure you notify the surgery center/hospital before the day of surgery so they can tell you which medications you should take or avoid the day of surgery. 6. No food or drink after midnight the night before surgery unless directed otherwise by surgical center/hospital staff. 7. No alcoholic beverages 24-hours prior to surgery.  No smoking 24-hours prior or 24-hours after  surgery. 8. Wear loose pants or shorts. They should be loose enough to fit over bandages, boots, and casts. 9. Don't wear slip-on shoes. Sneakers are preferred. 10. Bring your boot with you to the surgery center/hospital.  Also bring crutches or a walker if your physician has prescribed it for you.  If you do not have this equipment, it will be provided for you after surgery. 11. If you have not been contacted by the surgery center/hospital by the day before your surgery, call to confirm the date and time of your surgery. 12. Leave-time from work may vary depending on the type of surgery you have.  Appropriate arrangements should be made prior to surgery with your employer. 13. Prescriptions will be provided immediately following surgery by your doctor.  Fill these as soon as possible after surgery and take the medication as directed. Pain medications will not be refilled on weekends and must be approved by the doctor. 14. Remove nail polish on the operative foot and avoid getting pedicures prior to surgery. 15. Wash the night before surgery.  The night before surgery wash the foot and leg well with water and the antibacterial soap provided. Be sure to pay special attention to beneath the toenails and in between the toes.  Wash for at least three (3) minutes. Rinse thoroughly with water and dry well with a towel.  Perform this wash unless told not to do so by your physician.  Enclosed: 1 Ice pack (please put in freezer the night before surgery)   1 Hibiclens skin cleaner     Pre-op instructions  If you have any questions regarding the instructions, please do not hesitate to call our office.  [place]: [address], [place], [place] [address] -- [phone]  [place]: [address] [place], [place] [address] -- [phone]  [place]: [address]  [place], [place] [address] -- [phone]  [place]: [address], [address], [place], [place] [address] -- [phone]  Website:  [email or web address] 

## 2018-10-09 NOTE — Progress Notes (Signed)
Per Dr.  Jacqualyn Posey, I sent a surgical medical clearance letter to Dr. Jana Hakim and Dr. Nira Retort.

## 2018-10-11 DIAGNOSIS — M21619 Bunion of unspecified foot: Secondary | ICD-10-CM | POA: Insufficient documentation

## 2018-10-11 NOTE — Progress Notes (Signed)
Subjective:   Patient ID: Kelsey Mclaughlin, female   DOB: 58 y.o.   MRN: [id number]   HPI 58 year old female presents the office today requesting surgery for painful bunions on both of her feet with the left side worse than the right.  This is been ongoing issue for several years and she actually had seen in 2015 Dr. Blenda Mounts.  She was scheduled for surgery at that time however given her medical history she had to postpone the surgery.  Now that her other issues have resolved she was to proceed with surgery.  This is been a chronic issue for her and she is attempted numerous conservative treatments including, but not limited to, shoe modifications, offloading padding without any significant improvement.  At this point she wants to proceed with surgery.  She is in today with her wife who is translating for her.    Review of Systems  All other systems reviewed and are negative.  Past Medical History:  Diagnosis Date  . Bronchitis    completed antibiotics 10/21/13/ states resolved  . Cancer ([place]) 1998, 2015   Left Breast  . Chronic diastolic (congestive) heart failure ([place])   . Hypertension   . Lymph edema    lt arm  . Stroke [facility]) 2004   TIA-  no problems since    Past Surgical History:  Procedure Laterality Date  . ABDOMINOPLASTY/PANNICULECTOMY    . BREAST IMPLANT EXCHANGE Left 09/03/2014   Procedure: LEFT REMOVAL TISSUE EXPANDERS WITH PLACEMENT OF SILICONE IMPLANT ;  Surgeon: Irene Limbo, MD;  Location: [facility];  Service: Plastics;  Laterality: Left;  . BREAST RECONSTRUCTION Left 03/07/2015   Procedure: LEFT NIPPLE AEROLA CREATION WITH LOCAL FLAP/FULL THICKNESS SKIN GRAFT FROM GROIN;  Surgeon: Irene Limbo, MD;  Location: [facility];  Service: Plastics;  Laterality: Left;  . BREAST SURGERY  1998   lumpectomy on left and removed lymphnodes in [place]  . HYDRADENITIS EXCISION Left 05/30/2018   Procedure: Adjacent tissue transfer left  axilla;  Surgeon: Irene Limbo, MD;  Location: [facility];  Service: Plastics;  Laterality: Left;  . LATISSIMUS FLAP TO BREAST Left 04/12/2014   Procedure: LEFT LATISSIMUS DORSI FLAP FOR LEFT BREAST RECONSTRUCTION AND PLACEMENT OF TISSUE EXPANDER;  Surgeon: Irene Limbo, MD;  Location: [facility];  Service: Plastics;  Laterality: Left;  . LIPOSUCTION WITH LIPOFILLING Left 09/03/2014   Procedure:  LIPOFILLING TO LEFT CHEST  ;  Surgeon: Irene Limbo, MD;  Location: [facility];  Service: Plastics;  Laterality: Left;  . LIPOSUCTION WITH LIPOFILLING Left 03/07/2015   Procedure: LIPOFILLING TO LEFT BREAST;  Surgeon: Irene Limbo, MD;  Location: [facility];  Service: Plastics;  Laterality: Left;  Marland Kitchen MASTECTOMY Left 2015  . MASTOPEXY Right 09/03/2014   Procedure: RIGHT BREAST MASTOPEXY ;  Surgeon: Irene Limbo, MD;  Location: [facility];  Service: Plastics;  Laterality: Right;  . PORT-A-CATH REMOVAL Right 12/24/2014   Procedure: REMOVAL PORT-A-CATH;  Surgeon: Donnie Mesa, MD;  Location: [facility];  Service: General;  Laterality: Right;  . PORTACATH PLACEMENT Right 10/25/2013   Procedure: INSERTION PORT-A-CATH;  Surgeon: Imogene Burn. Georgette Dover, MD;  Location: [facility];  Service: General;  Laterality: Right;  . SIMPLE MASTECTOMY WITH AXILLARY SENTINEL NODE BIOPSY Left 04/12/2014   Procedure: MASTECTOMY;  Surgeon: Imogene Burn. Georgette Dover, MD;  Location: [facility];  Service: General;  Laterality: Left;  . TISSUE EXPANDER PLACEMENT Left 04/12/2014   Procedure: TISSUE EXPANDER;  Surgeon: Irene Limbo, MD;  Location: [facility];  Service: Plastics;  Laterality: Left;  . TISSUE EXPANDER PLACEMENT Left 05/22/2014   Procedure: PLACEMENT OF TISSUE EXPANDER/I&D WASHOUT SEROMA;  Surgeon: Irene Limbo, MD;  Location: [facility];  Service: Plastics;  Laterality: Left;     Current Outpatient Medications:  .  acyclovir (ZOVIRAX) 400 MG tablet, , Disp: ,  Rfl:  .  anastrozole (ARIMIDEX) 1 MG tablet, TAKE 1 TABLET (1 MG TOTAL) BY MOUTH DAILY., Disp: 90 tablet, Rfl: 3 .  B Complex Vitamins (VITAMIN B COMPLEX PO), Take 1 tablet by mouth., Disp: , Rfl:  .  cholecalciferol (VITAMIN D) 1000 units tablet, Take 1 tablet (1,000 Units total) by mouth daily., Disp: 100 tablet, Rfl: 4 .  clobetasol cream (TEMOVATE) 0.05 %, clobetasol 0.05 % topical cream, Disp: , Rfl:  .  desvenlafaxine (PRISTIQ) 100 MG 24 hr tablet, desvenlafaxine succinate ER 100 mg tablet,extended release 24 hr, Disp: , Rfl:  .  HYDROcodone-acetaminophen (NORCO) 5-325 MG tablet, Take 1 tablet by mouth every 4 (four) hours as needed for moderate pain., Disp: 10 tablet, Rfl: 0 .  losartan-hydrochlorothiazide (HYZAAR) 100-12.5 MG tablet, , Disp: , Rfl:  .  Multiple Vitamins-Minerals (MULTIVITAMIN WITH MINERALS) tablet, Take 1 tablet by mouth daily., Disp: , Rfl:   Allergies  Allergen Reactions  . Adhesive [Tape] Rash    Band-aids         Objective:  Physical Exam  General: AAO x3, NAD  Dermatological: Skin is warm, dry and supple bilateral. Nails x 10 are well manicured; remaining integument appears unremarkable at this time. There are no open sores, no preulcerative lesions, no rash or signs of infection present.  Vascular: Dorsalis Pedis artery and Posterior Tibial artery pedal pulses are 2/4 bilateral with immedate capillary fill time. Pedal hair growth present. No varicosities and no lower extremity edema present bilateral. There is no pain with calf compression, swelling, warmth, erythema.   Neruologic: Grossly intact via light touch bilateral.  Protective threshold with Semmes Wienstein monofilament intact to all pedal sites bilateral.  Musculoskeletal: Moderate bunion deformities present bilaterally and there is erythema on the first metatarsal heads and irritation set of shoes.  No increase in warmth or any skin breakdown.  No significant hypermobility present the first ray.   Tenderness refill on the bunion site.  Left side is worse than the right.  No other areas of tenderness are identified at this time.  Muscular strength 5/5 in all groups tested bilateral.  Gait: Unassisted, Nonantalgic.      Assessment:   Symptomatic bunion deformity left side worse than right    Plan:  -Treatment options discussed including all alternatives, risks, and complications -Etiology of symptoms were discussed -X-rays were obtained and reviewed with the patient.  Bunion deformities present bilaterally.  Mild arthritic changes present the first MPJs. -At this time we discussed both conservative as well as surgical treatment options.  After discussion she like to proceed with surgical intervention.  We discussed Austin bunionectomy with screw fixation.  We discussed the surgery as well as the postoperative course. -The incision placement as well as the postoperative course was discussed with the patient. I discussed risks of the surgery which include, but not limited to, infection, bleeding, pain, swelling, need for further surgery, delayed or nonhealing, painful or ugly scar, numbness or sensation changes, over/under correction, recurrence, transfer lesions, further deformity, hardware failure, DVT/PE, loss of toe/foot. Patient understands these risks and wishes to proceed with surgery. The surgical consent  was reviewed with the patient all 3 pages were signed. No promises or guarantees were given to the outcome of the procedure. All questions were answered to the best of my ability. Before the surgery the patient was encouraged to call the office if there is any further questions. The surgery will be performed at the [facility] on an outpatient basis. -CAM boot dispensed for postop use.  Trula Slade DPM

## 2018-10-12 ENCOUNTER — Other Ambulatory Visit: Payer: Self-pay | Admitting: Oncology

## 2018-10-12 DIAGNOSIS — C50412 Malignant neoplasm of upper-outer quadrant of left female breast: Secondary | ICD-10-CM

## 2018-10-12 DIAGNOSIS — Z170 Estrogen receptor positive status [ER+]: Principal | ICD-10-CM

## 2018-10-20 ENCOUNTER — Other Ambulatory Visit: Payer: Self-pay | Admitting: Adult Health

## 2018-10-20 DIAGNOSIS — C50412 Malignant neoplasm of upper-outer quadrant of left female breast: Secondary | ICD-10-CM

## 2018-10-20 DIAGNOSIS — Z170 Estrogen receptor positive status [ER+]: Principal | ICD-10-CM

## 2018-10-23 ENCOUNTER — Encounter: Payer: Self-pay | Admitting: *Deleted

## 2018-10-23 ENCOUNTER — Telehealth: Payer: Self-pay | Admitting: *Deleted

## 2018-10-23 ENCOUNTER — Ambulatory Visit
Admission: RE | Admit: 2018-10-23 | Discharge: 2018-10-23 | Disposition: A | Discharge status: Home / Self Care | Payer: Federal, State, Local not specified - PPO | Source: Ambulatory Visit | Attending: Oncology | Admitting: Oncology

## 2018-10-23 DIAGNOSIS — Z853 Personal history of malignant neoplasm of breast: Secondary | ICD-10-CM | POA: Diagnosis not present

## 2018-10-23 DIAGNOSIS — C50412 Malignant neoplasm of upper-outer quadrant of left female breast: Secondary | ICD-10-CM

## 2018-10-23 DIAGNOSIS — Z803 Family history of malignant neoplasm of breast: Secondary | ICD-10-CM | POA: Diagnosis not present

## 2018-10-23 DIAGNOSIS — Z170 Estrogen receptor positive status [ER+]: Principal | ICD-10-CM

## 2018-10-23 DIAGNOSIS — R928 Other abnormal and inconclusive findings on diagnostic imaging of breast: Secondary | ICD-10-CM | POA: Diagnosis not present

## 2018-10-23 NOTE — Telephone Encounter (Signed)
"  I am calling to see what time my wife's surgery is going to be on Wednesday with Dr. Jacqualyn Posey."  Someone from the surgical center usually calls patients a day or two prior to the surgery date.  They will give you the arrival time.  If you would like, you can call them.  Their phone number is [phone].  "Okay, thank you so much."  I am calling you back in regards to your wife's surgery.  I sent a medical clearance letter to Drs. Higher education careers adviser.  I have not heard back anything from either one of them.  I am calling to see if you can contact them as well and see if you can inquire about the clearance.  Dr. Jacqualyn Posey wants medical clearance before he performs the surgery.  "I don't know who Dr. Nira Retort is, he is not her primary care doctor nor is Dr. Jana Hakim, he's her Oncologist.  Her primary care doctor is Dr. Donald Prose at [facility].  Why does he need it from Dr. Jana Hakim?  What does he have to do with her having surgery?  I don't have time to be calling doctors.  This should have been done before now.  Just cancel the surgery until you get all this straightened out.  I'll just cancel my request to be off work.  I'm at another Dr appointment right now, I don't have time for this."   I updated the patient's primary care physician in [place].  I sent a medical clearance letter to Dr. Donald Prose.

## 2018-10-23 NOTE — Progress Notes (Signed)
I sent a medical clearance request letter to Dr. Donald Prose per Dr. Jacqualyn Posey.  Kelsey Mclaughlin is scheduled for surgery on 10/25/2018.

## 2018-10-24 NOTE — Telephone Encounter (Signed)
I called Caren Griffins at the surgical center and canceled the surgery.  I also canceled it in [facility] [employer].

## 2018-10-25 ENCOUNTER — Encounter: Payer: Self-pay | Admitting: *Deleted

## 2018-10-26 NOTE — Progress Notes (Addendum)
Mrs. Kelsey Mclaughlin came by the office to complain about her spouses' surgery being canceled for today.  She was irate.  She was raising her voice.  She demanded to see me.  The clinic manager, Renaldo Harrison, was in the meeting.  She told [name] that we could have a conversation but yelling was not acceptable.  I allowed her to voice her concerns.  She stated she did not cancel the surgery.  She stated it was not her fault that we had the wrong doctor listed as the primary care physician.  She stated if she had not called yesterday, no one would have called and informed her that medical clearance had not been received.  She stated she didn't know why medical clearance was needed in the first place.  I explained to her that Dr. Jacqualyn Posey wanted medical clearance due to her wife's medical history, she has heart trouble, a history of breast cancer, and a history of infection , sepsis.  I informed her that in [place], Dr. Abagail Kitchens was listed as the patient's primary care physician.  She stated her wife saw him about five years ago.   She stated she had updated the information when they came in for their first visit with our office and she asked why did it not get changed.  I informed her I could not answer that question.  I apologized to her for that mistake.  I explained to her that when we had the conversation on Monday. She told me to just cancel the surgery and to call them back whenever everything was worked out.  I told her she informed me that she was at another doctor's appointment and she told me that she did not have time for this.  She stated yes, she was at another appointment at the time.  She said she didn't feel like it was her responsibility to do my job.  She requested her wife's records and asked when she could get them.  [name] informed her she could get them immediately.  She asked if we could recommend another doctor in the [place] area that her wife could see.  I informed her they could contact  the Podiatrist in the [facility].  She received her medical records and a copy of her xrays at no charge to her.  We had received medical clearance from Dr. Donald Prose.  I gave her a copy of the medical clearance letter.

## 2018-10-31 ENCOUNTER — Other Ambulatory Visit: Payer: Federal, State, Local not specified - PPO

## 2018-11-10 DIAGNOSIS — R600 Localized edema: Secondary | ICD-10-CM | POA: Diagnosis not present

## 2018-11-10 DIAGNOSIS — L03114 Cellulitis of left upper limb: Secondary | ICD-10-CM | POA: Diagnosis not present

## 2019-01-11 DIAGNOSIS — F3342 Major depressive disorder, recurrent, in full remission: Secondary | ICD-10-CM | POA: Diagnosis not present

## 2019-01-26 DIAGNOSIS — L03114 Cellulitis of left upper limb: Secondary | ICD-10-CM | POA: Diagnosis not present

## 2019-01-26 DIAGNOSIS — L259 Unspecified contact dermatitis, unspecified cause: Secondary | ICD-10-CM | POA: Diagnosis not present

## 2019-01-31 DIAGNOSIS — L03114 Cellulitis of left upper limb: Secondary | ICD-10-CM | POA: Diagnosis not present

## 2019-02-02 DIAGNOSIS — Z0000 Encounter for general adult medical examination without abnormal findings: Secondary | ICD-10-CM | POA: Diagnosis not present

## 2019-02-02 DIAGNOSIS — L03114 Cellulitis of left upper limb: Secondary | ICD-10-CM | POA: Diagnosis not present

## 2019-02-02 DIAGNOSIS — I10 Essential (primary) hypertension: Secondary | ICD-10-CM | POA: Diagnosis not present

## 2019-02-18 ENCOUNTER — Other Ambulatory Visit: Payer: Self-pay | Admitting: Oncology

## 2019-04-25 DIAGNOSIS — L03114 Cellulitis of left upper limb: Secondary | ICD-10-CM | POA: Diagnosis not present

## 2019-05-01 ENCOUNTER — Other Ambulatory Visit: Payer: Self-pay

## 2019-05-01 DIAGNOSIS — C50412 Malignant neoplasm of upper-outer quadrant of left female breast: Secondary | ICD-10-CM

## 2019-05-02 ENCOUNTER — Other Ambulatory Visit: Payer: Self-pay

## 2019-05-02 ENCOUNTER — Inpatient Hospital Stay (HOSPITAL_BASED_OUTPATIENT_CLINIC_OR_DEPARTMENT_OTHER): Payer: Federal, State, Local not specified - PPO | Admitting: Oncology

## 2019-05-02 ENCOUNTER — Inpatient Hospital Stay: Payer: Federal, State, Local not specified - PPO | Attending: Oncology

## 2019-05-02 VITALS — BP 148/78 | HR 103 | Temp 98.5°F | Resp 20 | Wt 181.0 lb

## 2019-05-02 DIAGNOSIS — Z79811 Long term (current) use of aromatase inhibitors: Secondary | ICD-10-CM | POA: Diagnosis not present

## 2019-05-02 DIAGNOSIS — L539 Erythematous condition, unspecified: Secondary | ICD-10-CM | POA: Diagnosis not present

## 2019-05-02 DIAGNOSIS — C50412 Malignant neoplasm of upper-outer quadrant of left female breast: Secondary | ICD-10-CM

## 2019-05-02 DIAGNOSIS — M8580 Other specified disorders of bone density and structure, unspecified site: Secondary | ICD-10-CM | POA: Insufficient documentation

## 2019-05-02 DIAGNOSIS — R609 Edema, unspecified: Secondary | ICD-10-CM | POA: Diagnosis not present

## 2019-05-02 DIAGNOSIS — Z170 Estrogen receptor positive status [ER+]: Secondary | ICD-10-CM | POA: Insufficient documentation

## 2019-05-02 DIAGNOSIS — C50912 Malignant neoplasm of unspecified site of left female breast: Secondary | ICD-10-CM | POA: Diagnosis not present

## 2019-05-02 DIAGNOSIS — N951 Menopausal and female climacteric states: Secondary | ICD-10-CM | POA: Insufficient documentation

## 2019-05-02 LAB — CBC WITH DIFFERENTIAL (CANCER CENTER ONLY)
Abs Immature Granulocytes: 0.02 10*3/uL (ref 0.00–0.07)
Basophils Absolute: 0.1 10*3/uL (ref 0.0–0.1)
Basophils Relative: 1 %
Eosinophils Absolute: 0.3 10*3/uL (ref 0.0–0.5)
Eosinophils Relative: 4 %
HCT: 39.4 % (ref 36.0–46.0)
Hemoglobin: 13 g/dL (ref 12.0–15.0)
Immature Granulocytes: 0 %
Lymphocytes Relative: 22 %
Lymphs Abs: 1.6 10*3/uL (ref 0.7–4.0)
MCH: 30.2 pg (ref 26.0–34.0)
MCHC: 33 g/dL (ref 30.0–36.0)
MCV: 91.4 fL (ref 80.0–100.0)
Monocytes Absolute: 0.8 10*3/uL (ref 0.1–1.0)
Monocytes Relative: 10 %
Neutro Abs: 4.7 10*3/uL (ref 1.7–7.7)
Neutrophils Relative %: 63 %
Platelet Count: 336 10*3/uL (ref 150–400)
RBC: 4.31 MIL/uL (ref 3.87–5.11)
RDW: 12.1 % (ref 11.5–15.5)
WBC Count: 7.5 10*3/uL (ref 4.0–10.5)
nRBC: 0 % (ref 0.0–0.2)

## 2019-05-02 LAB — CMP (CANCER CENTER ONLY)
ALT: 90 U/L — ABNORMAL HIGH (ref 0–44)
AST: 48 U/L — ABNORMAL HIGH (ref 15–41)
Albumin: 4 g/dL (ref 3.5–5.0)
Alkaline Phosphatase: 89 U/L (ref 38–126)
Anion gap: 12 (ref 5–15)
BUN: 11 mg/dL (ref 6–20)
CO2: 24 mmol/L (ref 22–32)
Calcium: 9.7 mg/dL (ref 8.9–10.3)
Chloride: 105 mmol/L (ref 98–111)
Creatinine: 0.73 mg/dL (ref 0.44–1.00)
GFR, Est AFR Am: >60 mL/min (ref >60)
GFR, Estimated: >60 mL/min (ref >60)
Glucose, Bld: 104 mg/dL — ABNORMAL HIGH (ref 70–99)
Potassium: 4.2 mmol/L (ref 3.5–5.1)
Sodium: 141 mmol/L (ref 135–145)
Total Bilirubin: 0.2 mg/dL — ABNORMAL LOW (ref 0.3–1.2)
Total Protein: 7.3 g/dL (ref 6.5–8.1)

## 2019-05-02 MED ORDER — CEPHALEXIN 500 MG PO CAPS: 500.0000 mg | ORAL_CAPSULE | Freq: Four times a day (QID) | ORAL | 0 refills | Status: DC

## 2019-05-02 MED ORDER — ANASTROZOLE 1 MG PO TABS: ORAL_TABLET | ORAL | 3 refills | Status: DC

## 2019-05-02 NOTE — Progress Notes (Signed)
Kelsey Mclaughlin  [phone] [phone]     ID: BONI MACLELLAN Mastriaco OB: [birth year]  MR#: [id number]  IOM#:[id number]  Patient Care Team: Donald Prose, MD as PCP - General (Family Medicine) [name], MD as Referring Physician (Family Medicine) Irene Limbo, MD as Consulting Physician (Plastic Surgery) Gabbrielle Mcnicholas, Virgie Dad, MD as Consulting Physician (Oncology) Glory Buff, MD as Referring Physician (Obstetrics and Gynecology) OTHER MD: Gwendolyn Grant MD, Chucky May MD   CHIEF COMPLAINT: Breast cancer, recurrent, estrogen receptor positive  CURRENT TREATMENT: Completing 5 years of anastrozole November   INTERVAL HISTORY: [name] returns today for follow-up and treatment of her estrogen receptor positive breast cancer. She was last seen on 03/30/2018.   She continues on anastrozole.  She tolerates this well with minimal hot flashes  Vondra's last bone density screening on 11/24/2017, showed a T-score of -1.6, which is considered osteopenic.    Since her last visit here, she underwent a digital diagnostic right mammogram with tomography and an ultrasound of the right breast on 10/23/2018 showing: Breast Density Category B. There is no mammographic or sonographic evidence for malignancy in either breast.   Recently she developed some erythema and swelling of the left upper extremity.  Her primary care physician started her on doxycycline.  She has taken approximately 6 days and has been minimal improvement although certainly no progression.   REVIEW OF SYSTEMS: Nishika feels terrific.  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  She is exercising constantly and has an excellent diet.  She is feeling extremely healthy.  She has occasional discomfort particularly in the reconstructed left breast which she understands is secondary to the surgery and treatment and not to  recurrent cancer. She also has a new dog.  Aside from these issues a detailed review of systems today was stable  BREAST CANCER HISTORY: From doctor [name]'s intake note [id number]:  "Kelsey Mclaughlin is a 58 y.o. female. In 1998 at the age of 80 was diagnosed with left breast cancer in [place]. This was an invasive carcinoma grade 2 stage II. At that time she underwent a lumpectomy with excellent lymph node dissection. She received 6 months of chemotherapy. [name] She also had radiation therapy to the left breast. 2011 patient noticed a lump in the outside of her left breast. She had ultrasound workup performed and was told it was negative no biopsies were performed. General 2014 after patient moved to the [place] she had a mammogram performed this revealed a possible mass in the outer quadrant of the left breast. Ultrasound showed it to be 1.7 cm. MRI of the breasts performed on January 30 revealed the mass to be 1.5 x 2.2 x 1.5 cm. Because of this she also had a biopsy performed on 10/04/2013. The biopsy revealed [[id number]) an invasive ductal carcinoma with ductal carcinoma in situ grade 2. Prognostic panel was positive for estrogen receptor 100% megestrol receptor +33% proliferation marker Ki-67 15% and the tumor was HER-2/neu positive" [with a signals ratio of 3.1 and number per cell 6.3]  The patient's subsequent history is as detailed below   PAST MEDICAL HISTORY: Past Medical History:  Diagnosis Date  . Bronchitis    completed antibiotics 10/21/13/ states resolved  . Cancer ([place]) 1998, 2015   Left Breast  . Chronic diastolic (congestive) heart  failure ([place])   . Hypertension   . Lymph edema    lt arm  . Stroke [facility]) 2004   TIA-  no problems since    PAST SURGICAL HISTORY: Past Surgical History:  Procedure Laterality Date  . ABDOMINOPLASTY/PANNICULECTOMY    . BREAST IMPLANT EXCHANGE Left 09/03/2014    Procedure: LEFT REMOVAL TISSUE EXPANDERS WITH PLACEMENT OF SILICONE IMPLANT ;  Surgeon: Irene Limbo, MD;  Location: [place];  Service: Plastics;  Laterality: Left;  . BREAST RECONSTRUCTION Left 03/07/2015   Procedure: LEFT NIPPLE AEROLA CREATION WITH LOCAL FLAP/FULL THICKNESS SKIN GRAFT FROM GROIN;  Surgeon: Irene Limbo, MD;  Location: [place];  Service: Plastics;  Laterality: Left;  . BREAST SURGERY  1998   lumpectomy on left and removed lymphnodes in [place]  . HYDRADENITIS EXCISION Left 05/30/2018   Procedure: Adjacent tissue transfer left axilla;  Surgeon: Irene Limbo, MD;  Location: [place];  Service: Plastics;  Laterality: Left;  . LATISSIMUS FLAP TO BREAST Left 04/12/2014   Procedure: LEFT LATISSIMUS DORSI FLAP FOR LEFT BREAST RECONSTRUCTION AND PLACEMENT OF TISSUE EXPANDER;  Surgeon: Irene Limbo, MD;  Location: [place];  Service: Plastics;  Laterality: Left;  . LIPOSUCTION WITH LIPOFILLING Left 09/03/2014   Procedure:  LIPOFILLING TO LEFT CHEST  ;  Surgeon: Irene Limbo, MD;  Location: [place] [address] [place];  Service: Plastics;  Laterality: Left;  . LIPOSUCTION WITH LIPOFILLING Left 03/07/2015   Procedure: LIPOFILLING TO LEFT BREAST;  Surgeon: Irene Limbo, MD;  Location: [place];  Service: Plastics;  Laterality: Left;  Marland Kitchen MASTECTOMY Left 2015  . MASTOPEXY Right 09/03/2014   Procedure: RIGHT BREAST MASTOPEXY ;  Surgeon: Irene Limbo, MD;  Location: [place];  Service: Plastics;  Laterality: Right;  . PORT-A-CATH REMOVAL Right 12/24/2014   Procedure: REMOVAL PORT-A-CATH;  Surgeon: Donnie Mesa, MD;  Location: [place];  Service: General;  Laterality: Right;  . PORTACATH PLACEMENT Right 10/25/2013   Procedure: INSERTION PORT-A-CATH;  Surgeon: Imogene Burn. Georgette Dover, MD;  Location: [facility];  Service: General;  Laterality: Right;  . REDUCTION MAMMAPLASTY Right   .  SIMPLE MASTECTOMY WITH AXILLARY SENTINEL NODE BIOPSY Left 04/12/2014   Procedure: MASTECTOMY;  Surgeon: Imogene Burn. Georgette Dover, MD;  Location: [place];  Service: General;  Laterality: Left;  . TISSUE EXPANDER PLACEMENT Left 04/12/2014   Procedure: TISSUE EXPANDER;  Surgeon: Irene Limbo, MD;  Location: [place];  Service: Plastics;  Laterality: Left;  . TISSUE EXPANDER PLACEMENT Left 05/22/2014   Procedure: PLACEMENT OF TISSUE EXPANDER/I&D WASHOUT SEROMA;  Surgeon: Irene Limbo, MD;  Location: [place];  Service: Plastics;  Laterality: Left;    FAMILY HISTORY Family History  Problem Relation Age of Onset  . Breast cancer Mother 63  . Heart disease Father   . Heart attack Father   . Breast cancer Maternal Grandmother 3  . Brain cancer Maternal Uncle 74       benign brain tumor  . Heart attack Paternal Aunt     GYNECOLOGIC HISTORY:   menarche age 61, first live birth age 9. The patient is GX P2. She went through the change of life approximately 2011.    SOCIAL HISTORY:  The patient worked previously as a Marine scientist. She has 2 children both of whom live in [place], works for the [employer], and [employer], a Librarian, academic. They are in their early 75s. There are no grandchildren. The patient married [name] (both women are  originally from [place]) in [place]. [place] 5 dogs, 40+ chickens and 2 horses. They're not church attenders    ADVANCED DIRECTIVES: in place   HEALTH MAINTENANCE: Social History   Tobacco Use  . Smoking status: Never Smoker  . Smokeless tobacco: Never Used  Substance Use Topics  . Alcohol use: Yes    Comment: 2 drinks/day  . Drug use: No     Colonoscopy:  PAP: May 2017    Bone density:  11/24/2017 showed a T score of  -1.6 osteopenia  Lipid panel:  Allergies  Allergen Reactions  . Adhesive [Tape] Rash    Band-aids    Current Outpatient Medications  Medication Sig Dispense Refill  . anastrozole (ARIMIDEX) 1  MG tablet TAKE 1 TABLET BY MOUTH EVERY DAY 90 tablet 3  . B Complex Vitamins (VITAMIN B COMPLEX PO) Take 1 tablet by mouth.    . cholecalciferol (VITAMIN D) 1000 units tablet Take 1 tablet (1,000 Units total) by mouth daily. 100 tablet 4  . clobetasol cream (TEMOVATE) 0.05 % clobetasol 0.05 % topical cream    . desvenlafaxine (PRISTIQ) 100 MG 24 hr tablet desvenlafaxine succinate ER 100 mg tablet,extended release 24 hr    . losartan-hydrochlorothiazide (HYZAAR) 100-12.5 MG tablet     . Multiple Vitamins-Minerals (MULTIVITAMIN WITH MINERALS) tablet Take 1 tablet by mouth daily.     No current facility-administered medications for this visit.     OBJECTIVE: middle-aged white woman in no acute distress  Vitals:   05/02/19 1345  BP: (!) 148/78  Pulse: (!) 103  Resp: 20  Temp: 98.5 F (36.9 C)  SpO2: 100%     Body mass index is 27.52 kg/m.    Filed Weights   05/02/19 1345  Weight: 181 lb (82.1 kg)    ECOG FS:0 - Asymptomatic   Sclerae unicteric, EOMs intact Wearing a mask No cervical or supraclavicular adenopathy Lungs no rales or rhonchi Heart regular rate and rhythm Abd soft, nontender, positive bowel sounds MSK no focal spinal tenderness, minimal right upper extremity lymphedema and very minimal blush in the lower portion of the arm and wrist Neuro: nonfocal, well oriented, appropriate affect Breasts: The right breast is status post reduction mammoplasty.  It is otherwise unremarkable.  The left breast is status post mastectomy and reconstruction.  There is no evidence of local recurrence.  Both axillae are benign.   LAB RESULTS:  CMP     Component Value Date/Time   NA 141 05/02/2019 1327   NA 140 02/16/2017 1323   K 4.2 05/02/2019 1327   K 3.7 02/16/2017 1323   CL 105 05/02/2019 1327   CO2 24 05/02/2019 1327   CO2 26 02/16/2017 1323   GLUCOSE 104 (H) 05/02/2019 1327   GLUCOSE 113 02/16/2017 1323   BUN 11 05/02/2019 1327   BUN 12.8 02/16/2017 1323   CREATININE  0.73 05/02/2019 1327   CREATININE 0.8 02/16/2017 1323   CALCIUM 9.7 05/02/2019 1327   CALCIUM 9.7 02/16/2017 1323   PROT 7.3 05/02/2019 1327   PROT 6.9 02/16/2017 1323   ALBUMIN 4.0 05/02/2019 1327   ALBUMIN 4.1 02/16/2017 1323   AST 48 (H) 05/02/2019 1327   AST 34 02/16/2017 1323   ALT 90 (H) 05/02/2019 1327   ALT 58 (H) 02/16/2017 1323   ALKPHOS 89 05/02/2019 1327   ALKPHOS 107 02/16/2017 1323   BILITOT 0.2 (L) 05/02/2019 1327   BILITOT 0.28 02/16/2017 1323   GFRNONAA >60  05/02/2019 1327   GFRAA >60 05/02/2019 1327    I No results found for: SPEP  Lab Results  Component Value Date   WBC 7.5 05/02/2019   NEUTROABS 4.7 05/02/2019   HGB 13.0 05/02/2019   HCT 39.4 05/02/2019   MCV 91.4 05/02/2019   PLT 336 05/02/2019      Chemistry      Component Value Date/Time   NA 141 05/02/2019 1327   NA 140 02/16/2017 1323   K 4.2 05/02/2019 1327   K 3.7 02/16/2017 1323   CL 105 05/02/2019 1327   CO2 24 05/02/2019 1327   CO2 26 02/16/2017 1323   BUN 11 05/02/2019 1327   BUN 12.8 02/16/2017 1323   CREATININE 0.73 05/02/2019 1327   CREATININE 0.8 02/16/2017 1323      Component Value Date/Time   CALCIUM 9.7 05/02/2019 1327   CALCIUM 9.7 02/16/2017 1323   ALKPHOS 89 05/02/2019 1327   ALKPHOS 107 02/16/2017 1323   AST 48 (H) 05/02/2019 1327   AST 34 02/16/2017 1323   ALT 90 (H) 05/02/2019 1327   ALT 58 (H) 02/16/2017 1323   BILITOT 0.2 (L) 05/02/2019 1327   BILITOT 0.28 02/16/2017 1323       No results found for: LABCA2  No components found for: LABCA125  No results for input(s): INR in the last 168 hours.  Urinalysis    Component Value Date/Time   COLORURINE YELLOW 04/05/2016 2040   APPEARANCEUR CLEAR 04/05/2016 2040   LABSPEC 1.027 04/05/2016 2040   LABSPEC 1.005 05/22/2014 1130   PHURINE 7.0 04/05/2016 2040   GLUCOSEU NEGATIVE 04/05/2016 2040   GLUCOSEU Negative 05/22/2014 1130   HGBUR NEGATIVE 04/05/2016 2040   BILIRUBINUR NEGATIVE 04/05/2016 2040    BILIRUBINUR Negative 05/22/2014 1130   KETONESUR NEGATIVE 04/05/2016 2040   PROTEINUR 100 (A) 04/05/2016 2040   UROBILINOGEN 0.2 05/22/2014 1130   NITRITE NEGATIVE 04/05/2016 2040   LEUKOCYTESUR NEGATIVE 04/05/2016 2040   LEUKOCYTESUR Negative 05/22/2014 1130    STUDIES: No results found.   ASSESSMENT: 58 y.o. BRCA negative [place] woman, [place] speaker   (1) status post left lumpectomy and axillary lymph node dissection in 1998 in [place] for a stage II invasive ductal carcinoma, grade 2, treated with adjuvant chemotherapy (specific drugs not clear) and adjuvant radiation.  (2) status post left breast upper outer quadrant biopsy 10/04/2013 for a clinically mT2 N0, stage IIA invasive ductal carcinoma, grade 2, estrogen receptor 100% positive, progesterone receptor 33% positive, with an MIB-1 of 15%, and HER-2 amplified; staging studies showed no evidence of metastatic disease  (3) started on neoadjuvant carboplatin/ docetaxel/ trastuzumab/ pertuzumab 11/12/2013  (a) completed 4 cycles as of 05/06/201, after which docetaxel was discontinued because of neuropathy symptoms   (b) gemcitabine was given day 1 and day 8 with carboplatin day 1 in the final 2 cycles, completed 03/04/2014   (c)  completed a year of trastuzumab March 2016--  Final echo 10/21/2014 shows a normal EF  (4) left mastectomy  04/12/2014 showed residual mpT1c pNX invasive ductal carcinoma, grade 2, estrogen and progesterone receptor positive, HER-2 negative, with negative margins  (5) anastrozole started 07/14/2014, discontinued November 2020  (a) bone density 07/29/2014 shows T score - 1.2 (mild osteopenia)  (b) bone density 11/24/2017 showed a T score of  -1.6 osteopenia  (6) 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   PLAN: [place] is now a little over 5 years out from definitive surgery for her recurrent  breast cancer.  There is no evidence of disease.  This is very  favorable.  She will complete 5 years of anastrozole in November.  She understands that if she continue that for an additional 2 years she might have a 1 or 2% extra risk reduction.  This does not motivate her.  She expects to feel considerably better off the drug and that probably is the case.  Accordingly the plan is for her to stop anastrozole in November.  We considered "graduating" and also moving to our survivorship program but she really would prefer to continue yearly visits with me as we have been doing and that is being operational lysed.  I am concerned that the left upper extremity edema and erythema has not entirely normalized or gone back to baseline despite 6 days of doxycycline.  I am going to add cephalexin just in case.  If things have not improved within 5 or 6 days she will let me know.  She does have a left compression sleeve and also a nighttime machine which she uses at least an hour on the evenings.  Am delighted that she is doing so well overall.  She will see me again in a year.   Derek Huneycutt, Virgie Dad, MD  05/02/19 2:19 PM Medical Oncology and Hematology [facility] [address] [place], [place] [address] Tel. [phone]    Fax. [phone]   I, Wilburn Mylar, am acting as scribe for Dr. Virgie Dad. Noeh Sparacino.  I, Lurline Del MD, have reviewed the above documentation for accuracy and completeness, and I agree with the above.

## 2019-05-04 ENCOUNTER — Telehealth: Payer: Self-pay | Admitting: Oncology

## 2019-05-04 NOTE — Telephone Encounter (Signed)
I talk with patient regarding schedule  

## 2019-05-28 ENCOUNTER — Other Ambulatory Visit: Payer: Self-pay | Admitting: Oncology

## 2019-05-28 MED ORDER — CEPHALEXIN 500 MG PO CAPS: 500.0000 mg | ORAL_CAPSULE | Freq: Four times a day (QID) | ORAL | 0 refills | Status: DC

## 2019-05-28 NOTE — Progress Notes (Signed)
Kelsey Mclaughlin called to report cellulitis in her left hand.  She gave me a very good description.  I am starting her on Keflex.  She will call tomorrow and let us know if it is not better

## 2019-05-29 ENCOUNTER — Telehealth: Payer: Self-pay | Admitting: *Deleted

## 2019-05-29 NOTE — Telephone Encounter (Signed)
This RN received VM stating " Kelsey Mclaughlin's hand is doing better ".  Name of person calling- not given.  This note will be given to MD for review per noted call from yesterday.

## 2019-07-19 ENCOUNTER — Other Ambulatory Visit: Payer: Self-pay | Admitting: Oncology

## 2019-07-19 MED ORDER — CEPHALEXIN 500 MG PO CAPS: 500.0000 mg | ORAL_CAPSULE | Freq: Four times a day (QID) | ORAL | 0 refills | Status: DC

## 2019-07-19 NOTE — Progress Notes (Signed)
I was called today by Kelsey Mclaughlin his significant other telling me that [facility] again has swelling and erythema and her left arm.  I went ahead and called cephalexin for her and asked her to call us back on Monday and let us know how things are going.

## 2019-07-24 ENCOUNTER — Other Ambulatory Visit: Payer: Self-pay | Admitting: Oncology

## 2019-07-24 MED ORDER — CEPHALEXIN 500 MG PO CAPS: 500.0000 mg | ORAL_CAPSULE | Freq: Two times a day (BID) | ORAL | 0 refills | Status: DC

## 2019-07-24 NOTE — Progress Notes (Signed)
Evely his spouse called me to tell me that Kelsey Mclaughlin was much better but the problem was not yet cleared.  She still has a little bit of swelling a little bit of erythema now mostly in the hand and some itching.  We will go to give her an additional week of Keflex and they will call me again a week from now.

## 2019-09-24 ENCOUNTER — Other Ambulatory Visit: Payer: Self-pay | Admitting: Oncology

## 2019-09-24 DIAGNOSIS — Z1231 Encounter for screening mammogram for malignant neoplasm of breast: Secondary | ICD-10-CM

## 2019-10-26 ENCOUNTER — Ambulatory Visit
Admission: RE | Admit: 2019-10-26 | Discharge: 2019-10-26 | Disposition: A | Discharge status: Home / Self Care | Payer: Federal, State, Local not specified - PPO | Source: Ambulatory Visit | Attending: Oncology | Admitting: Oncology

## 2019-10-26 ENCOUNTER — Other Ambulatory Visit: Payer: Self-pay

## 2019-10-26 DIAGNOSIS — Z1231 Encounter for screening mammogram for malignant neoplasm of breast: Secondary | ICD-10-CM

## 2019-10-29 ENCOUNTER — Encounter: Payer: Self-pay | Admitting: Oncology

## 2019-12-13 ENCOUNTER — Other Ambulatory Visit: Payer: Self-pay | Admitting: Oncology

## 2019-12-13 MED ORDER — CEPHALEXIN 500 MG PO CAPS: 500.0000 mg | ORAL_CAPSULE | Freq: Two times a day (BID) | ORAL | 0 refills | Status: DC

## 2019-12-13 NOTE — Progress Notes (Signed)
Kelsey Mclaughlin again has developed cellulitis in the left hand.  This is something that is happening every few months.  I think she should wear her compression sleeve all the time but they tell me the compression sleeve no longer fits.  I am mailing her a prescription for a new compression sleeve and giving her another prescription for cephalexin.  In case resistance has developed she will call and I will give her a different antibiotic

## 2019-12-19 ENCOUNTER — Other Ambulatory Visit: Payer: Self-pay | Admitting: Oncology

## 2019-12-19 MED ORDER — SULFAMETHOXAZOLE-TRIMETHOPRIM 800-160 MG PO TABS: 1.0000 | ORAL_TABLET | Freq: Two times a day (BID) | ORAL | 1 refills | Status: DC

## 2019-12-19 NOTE — Progress Notes (Signed)
Kelsey Mclaughlin called today to tell me Kelsey Mclaughlin hand is really no better after taking cephalexin for several days.  It is not worse thankfully.  We are going to go with Septra.  I have asked her to give me a call on 12/21/2019 and let me know how its going.

## 2020-02-24 ENCOUNTER — Encounter (HOSPITAL_COMMUNITY): Payer: Self-pay | Admitting: *Deleted

## 2020-02-24 ENCOUNTER — Emergency Department (HOSPITAL_COMMUNITY)
Admission: EM | Admit: 2020-02-24 | Discharge: 2020-02-25 | Disposition: A | Discharge status: Home / Self Care | Payer: Federal, State, Local not specified - PPO | Attending: Emergency Medicine | Admitting: Emergency Medicine

## 2020-02-24 ENCOUNTER — Other Ambulatory Visit: Payer: Self-pay

## 2020-02-24 DIAGNOSIS — Z853 Personal history of malignant neoplasm of breast: Secondary | ICD-10-CM | POA: Insufficient documentation

## 2020-02-24 DIAGNOSIS — I5032 Chronic diastolic (congestive) heart failure: Secondary | ICD-10-CM | POA: Diagnosis not present

## 2020-02-24 DIAGNOSIS — I110 Hypertensive heart disease with heart failure: Secondary | ICD-10-CM | POA: Insufficient documentation

## 2020-02-24 DIAGNOSIS — L03114 Cellulitis of left upper limb: Secondary | ICD-10-CM | POA: Diagnosis not present

## 2020-02-24 DIAGNOSIS — Z8673 Personal history of transient ischemic attack (TIA), and cerebral infarction without residual deficits: Secondary | ICD-10-CM | POA: Insufficient documentation

## 2020-02-24 DIAGNOSIS — Z79899 Other long term (current) drug therapy: Secondary | ICD-10-CM | POA: Diagnosis not present

## 2020-02-24 DIAGNOSIS — L539 Erythematous condition, unspecified: Secondary | ICD-10-CM | POA: Diagnosis not present

## 2020-02-24 DIAGNOSIS — R600 Localized edema: Secondary | ICD-10-CM | POA: Diagnosis not present

## 2020-02-24 NOTE — ED Triage Notes (Signed)
The pt had a lt mastectomy years ago for cancer she had lymph nodes removed  Since 1000 yesterday am her lt arm has been red and extremely swollen  This has occurred in the past but it is moving fast this time

## 2020-02-24 NOTE — ED Triage Notes (Signed)
The pt has redness and swelling of the lt arm from her fingers on the lt up until her lt elbow  Her fingers are extremely swollen

## 2020-02-25 ENCOUNTER — Emergency Department (HOSPITAL_BASED_OUTPATIENT_CLINIC_OR_DEPARTMENT_OTHER): Payer: Federal, State, Local not specified - PPO

## 2020-02-25 DIAGNOSIS — M79609 Pain in unspecified limb: Secondary | ICD-10-CM | POA: Diagnosis not present

## 2020-02-25 DIAGNOSIS — M79672 Pain in left foot: Secondary | ICD-10-CM | POA: Diagnosis not present

## 2020-02-25 DIAGNOSIS — M7989 Other specified soft tissue disorders: Secondary | ICD-10-CM

## 2020-02-25 DIAGNOSIS — L03114 Cellulitis of left upper limb: Secondary | ICD-10-CM | POA: Diagnosis not present

## 2020-02-25 DIAGNOSIS — L538 Other specified erythematous conditions: Secondary | ICD-10-CM | POA: Diagnosis not present

## 2020-02-25 DIAGNOSIS — I10 Essential (primary) hypertension: Secondary | ICD-10-CM | POA: Diagnosis not present

## 2020-02-25 LAB — CBC WITH DIFFERENTIAL/PLATELET
Abs Immature Granulocytes: 0.05 10*3/uL (ref 0.00–0.07)
Basophils Absolute: 0.1 10*3/uL (ref 0.0–0.1)
Basophils Relative: 0 %
Eosinophils Absolute: 0.2 10*3/uL (ref 0.0–0.5)
Eosinophils Relative: 2 %
HCT: 45.3 % (ref 36.0–46.0)
Hemoglobin: 13.5 g/dL (ref 12.0–15.0)
Immature Granulocytes: 0 %
Lymphocytes Relative: 15 %
Lymphs Abs: 1.7 10*3/uL (ref 0.7–4.0)
MCH: 30.4 pg (ref 26.0–34.0)
MCHC: 29.8 g/dL — ABNORMAL LOW (ref 30.0–36.0)
MCV: 102 fL — ABNORMAL HIGH (ref 80.0–100.0)
Monocytes Absolute: 0.7 10*3/uL (ref 0.1–1.0)
Monocytes Relative: 6 %
Neutro Abs: 8.5 10*3/uL — ABNORMAL HIGH (ref 1.7–7.7)
Neutrophils Relative %: 77 %
Platelets: 265 10*3/uL (ref 150–400)
RBC: 4.44 MIL/uL (ref 3.87–5.11)
RDW: 12.4 % (ref 11.5–15.5)
WBC: 11.2 10*3/uL — ABNORMAL HIGH (ref 4.0–10.5)
nRBC: 0 % (ref 0.0–0.2)

## 2020-02-25 LAB — COMPREHENSIVE METABOLIC PANEL
ALT: 74 U/L — ABNORMAL HIGH (ref 0–44)
AST: 42 U/L — ABNORMAL HIGH (ref 15–41)
Albumin: 4.2 g/dL (ref 3.5–5.0)
Alkaline Phosphatase: 79 U/L (ref 38–126)
Anion gap: 14 (ref 5–15)
BUN: 11 mg/dL (ref 6–20)
CO2: 22 mmol/L (ref 22–32)
Calcium: 9.4 mg/dL (ref 8.9–10.3)
Chloride: 102 mmol/L (ref 98–111)
Creatinine, Ser: 0.65 mg/dL (ref 0.44–1.00)
GFR calc Af Amer: >60 mL/min (ref >60)
GFR calc non Af Amer: >60 mL/min (ref >60)
Glucose, Bld: 120 mg/dL — ABNORMAL HIGH (ref 70–99)
Potassium: 3.9 mmol/L (ref 3.5–5.1)
Sodium: 138 mmol/L (ref 135–145)
Total Bilirubin: 0.5 mg/dL (ref 0.3–1.2)
Total Protein: 7.1 g/dL (ref 6.5–8.1)

## 2020-02-25 LAB — PROTIME-INR
INR: 0.9 (ref 0.8–1.2)
Prothrombin Time: 12.1 seconds (ref 11.4–15.2)

## 2020-02-25 LAB — LACTIC ACID, PLASMA
Lactic Acid, Venous: 2.5 mmol/L (ref 0.5–1.9)
Lactic Acid, Venous: 2.3 mmol/L (ref 0.5–1.9)

## 2020-02-25 MED ORDER — CEPHALEXIN 500 MG PO CAPS
500.0000 mg | ORAL_CAPSULE | Freq: Three times a day (TID) | ORAL | 0 refills | Status: AC
Start: 2020-02-25 — End: 2020-03-03

## 2020-02-25 MED ORDER — SODIUM CHLORIDE 0.9 % IV SOLN
1.0000 g | Freq: Once | INTRAVENOUS | Status: AC
  Administered 2020-02-25: 1 g via INTRAVENOUS
  Filled 2020-02-25: qty 10

## 2020-02-25 MED ORDER — SODIUM CHLORIDE 0.9 % IV BOLUS
500.0000 mL | Freq: Once | INTRAVENOUS | Status: AC
  Administered 2020-02-25: 500 mL via INTRAVENOUS

## 2020-02-25 NOTE — Discharge Instructions (Addendum)
Please be sure to follow-up with your physician today.  Return here for concerning changes in your condition.

## 2020-02-25 NOTE — ED Notes (Signed)
Took patient saline lock out patient is resting with call bell in reach 

## 2020-02-25 NOTE — ED Notes (Signed)
Pts wife became very angry yelling at staff that they were leaving because they have been here all night. Tried to explain to pt and family we were waiting on [place] of arm to rule out DVT. Charge RN called security due to pt's wife not letting us speak to the pt and only yelling at staff. Wife was asked to exit room and a [place] interpretor used to speak with pt and pt verbalizes understanding of waiting on [place] of left arm and agreeable to stay to wait for this test.    Pts wife then left angry because she wanted pt to leave.

## 2020-02-25 NOTE — ED Notes (Signed)
RN only able to draw blue top for second culture set.

## 2020-02-25 NOTE — ED Notes (Addendum)
[place] interpreter Mariane Baumgarten [phone]   Called to inform pt the importance of staying for further evaluation.

## 2020-02-25 NOTE — ED Provider Notes (Signed)
[facility] EMERGENCY DEPARTMENT Provider Note  CSN: [id number] Arrival date & time: 02/24/20 2301  Chief Complaint(s) Arm Pain  HPI Kelsey Mclaughlin is a 59 y.o. female with h/o left breast cancer s/p resection and radiation, left arm lymphedema.  HPI CC: left arm pain   Onset/Duration: gradual, 1 day Timing: constant Location: left hand and forearm Quality: "like it's swollen", itching Severity: moderate Modifying Factors:  Improved by: nothing  Worsened by: nothing Associated Signs/Symptoms:  Pertinent (+): increased swelling, redness  Pertinent (-): fevers, chills, no chest pain, SOB, no trauma   Past Medical History Past Medical History:  Diagnosis Date  . Bronchitis    completed antibiotics 10/21/13/ states resolved  . Cancer ([place]) 1998, 2015   Left Breast  . Chronic diastolic (congestive) heart failure ([place])   . Hypertension   . Lymph edema    lt arm  . Stroke [facility]) 2004   TIA-  no problems since   Patient Active Problem List   Diagnosis Date Noted  . Bunion 10/11/2018  . Essential hypertension 10/09/2018  . Depression   . Hypokalemia 04/06/2016  . Sepsis ([place]) 04/06/2016  . Cellulitis of left hand 04/06/2016  . Lymph edema   . Chronic diastolic (congestive) heart failure ([place])   . History of therapeutic radiation 01/14/2015  . Recurrent breast cancer ([place]) 04/12/2014  . Hepatic steatosis 02/01/2014  . Malignant neoplasm of upper-outer quadrant of left breast in female, estrogen receptor positive ([place]) 10/12/2013  . Stroke [facility]) 09/13/2002   Home Medication(s) Prior to Admission medications   Medication Sig Start Date End Date Taking? Authorizing Provider  desvenlafaxine (PRISTIQ) 50 MG 24 hr tablet Take 50 mg by mouth daily. 10/19/19  Yes [provider]  losartan-hydrochlorothiazide Konrad Penta) 100-12.5 MG tablet  09/26/18  Yes [provider]  anastrozole (ARIMIDEX) 1 MG tablet TAKE 1 TABLET BY MOUTH EVERY  DAY Patient not taking: Reported on 02/25/2020 05/02/19   Magrinat, Virgie Dad, MD  cephALEXin (KEFLEX) 500 MG capsule Take 1 capsule (500 mg total) by mouth 3 (three) times daily for 7 days. 02/25/20 03/03/20  Fatima Blank, MD  cholecalciferol (VITAMIN D) 25 MCG (1000 UT) tablet TAKE 1 TABLET BY MOUTH EVERY DAY Patient not taking: Reported on 02/25/2020 05/28/19   Magrinat, Virgie Dad, MD  sulfamethoxazole-trimethoprim (BACTRIM DS) 800-160 MG tablet Take 1 tablet by mouth 2 (two) times daily. Patient not taking: Reported on 02/25/2020 12/19/19   Magrinat, Virgie Dad, MD                                                                                                                                    Past Surgical History Past Surgical History:  Procedure Laterality Date  . ABDOMINOPLASTY/PANNICULECTOMY    . BREAST IMPLANT EXCHANGE Left 09/03/2014   Procedure: LEFT REMOVAL TISSUE EXPANDERS WITH PLACEMENT OF SILICONE IMPLANT ;  Surgeon: Irene Limbo, MD;  Location: [facility];  Service: Plastics;  Laterality: Left;  . BREAST RECONSTRUCTION Left 03/07/2015   Procedure: LEFT NIPPLE AEROLA CREATION WITH LOCAL FLAP/FULL THICKNESS SKIN GRAFT FROM GROIN;  Surgeon: Irene Limbo, MD;  Location: [facility];  Service: Plastics;  Laterality: Left;  . BREAST SURGERY  1998   lumpectomy on left and removed lymphnodes in [place]  . HYDRADENITIS EXCISION Left 05/30/2018   Procedure: Adjacent tissue transfer left axilla;  Surgeon: Irene Limbo, MD;  Location: [facility];  Service: Plastics;  Laterality: Left;  . LATISSIMUS FLAP TO BREAST Left 04/12/2014   Procedure: LEFT LATISSIMUS DORSI FLAP FOR LEFT BREAST RECONSTRUCTION AND PLACEMENT OF TISSUE EXPANDER;  Surgeon: Irene Limbo, MD;  Location: [facility];  Service: Plastics;  Laterality: Left;  . LIPOSUCTION WITH LIPOFILLING Left 09/03/2014   Procedure:  LIPOFILLING TO LEFT CHEST  ;  Surgeon: Irene Limbo,  MD;  Location: [place] [facility];  Service: Plastics;  Laterality: Left;  . LIPOSUCTION WITH LIPOFILLING Left 03/07/2015   Procedure: LIPOFILLING TO LEFT BREAST;  Surgeon: Irene Limbo, MD;  Location: [facility];  Service: Plastics;  Laterality: Left;  Marland Kitchen MASTECTOMY Left 2015  . MASTOPEXY Right 09/03/2014   Procedure: RIGHT BREAST MASTOPEXY ;  Surgeon: Irene Limbo, MD;  Location: [place];  Service: Plastics;  Laterality: Right;  . PORT-A-CATH REMOVAL Right 12/24/2014   Procedure: REMOVAL PORT-A-CATH;  Surgeon: Donnie Mesa, MD;  Location: [facility];  Service: General;  Laterality: Right;  . PORTACATH PLACEMENT Right 10/25/2013   Procedure: INSERTION PORT-A-CATH;  Surgeon: Imogene Burn. Georgette Dover, MD;  Location: [facility];  Service: General;  Laterality: Right;  . REDUCTION MAMMAPLASTY Right   . SIMPLE MASTECTOMY WITH AXILLARY SENTINEL NODE BIOPSY Left 04/12/2014   Procedure: MASTECTOMY;  Surgeon: Imogene Burn. Georgette Dover, MD;  Location: [facility];  Service: General;  Laterality: Left;  . TISSUE EXPANDER PLACEMENT Left 04/12/2014   Procedure: TISSUE EXPANDER;  Surgeon: Irene Limbo, MD;  Location: [facility];  Service: Plastics;  Laterality: Left;  . TISSUE EXPANDER PLACEMENT Left 05/22/2014   Procedure: PLACEMENT OF TISSUE EXPANDER/I&D WASHOUT SEROMA;  Surgeon: Irene Limbo, MD;  Location: [place];  Service: Plastics;  Laterality: Left;   Family History Family History  Problem Relation Age of Onset  . Breast cancer Mother 57  . Heart disease Father   . Heart attack Father   . Breast cancer Maternal Grandmother 67  . Brain cancer Maternal Uncle 74       benign brain tumor  . Heart attack Paternal Aunt     Social History Social History   Tobacco Use  . Smoking status: Never Smoker  . Smokeless tobacco: Never Used  Substance Use Topics  . Alcohol use: Yes    Comment: 2 drinks/day  . Drug use: No   Allergies Adhesive [tape]  Review of  Systems Review of Systems All other systems are reviewed and are negative for acute change except as noted in the HPI  Physical Exam Vital Signs  I have reviewed the triage vital signs BP (!) 111/53 (BP Location: Right Arm)   Pulse 87   Temp 98.6 F (37 C) (Oral)   Resp 18   Ht 5\' 7"  (1.702 m)   Wt 81.6 kg   SpO2 99%   BMI 28.19 kg/m   Physical Exam Vitals reviewed.  Constitutional:      General: She is not in acute distress.    Appearance: She is well-developed.  She is not diaphoretic.  HENT:     Head: Normocephalic and atraumatic.     Right Ear: External ear normal.     Left Ear: External ear normal.     Nose: Nose normal.  Eyes:     General: No scleral icterus.    Conjunctiva/sclera: Conjunctivae normal.  Neck:     Trachea: Phonation normal.  Cardiovascular:     Rate and Rhythm: Normal rate and regular rhythm.  Pulmonary:     Effort: Pulmonary effort is normal. No respiratory distress.     Breath sounds: No stridor.  Abdominal:     General: There is no distension.  Musculoskeletal:        General: Normal range of motion.     Left forearm: Swelling, edema and tenderness present.     Cervical back: Normal range of motion.     Comments: Significant swelling to left arm from hand to distal upper arm. Warm to touch. Blotchy erythema. No break in skin  Neurological:     Mental Status: She is alert and oriented to person, place, and time.  Psychiatric:        Behavior: Behavior normal.     ED Results and Treatments Labs (all labs ordered are listed, but only abnormal results are displayed) Labs Reviewed  COMPREHENSIVE METABOLIC PANEL - Abnormal; Notable for the following components:      Result Value   Glucose, Bld 120 (*)    AST 42 (*)    ALT 74 (*)    All other components within normal limits  LACTIC ACID, PLASMA - Abnormal; Notable for the following components:   Lactic Acid, Venous 2.5 (*)    All other components within normal limits  LACTIC ACID,  PLASMA - Abnormal; Notable for the following components:   Lactic Acid, Venous 2.3 (*)    All other components within normal limits  CBC WITH DIFFERENTIAL/PLATELET - Abnormal; Notable for the following components:   WBC 11.2 (*)    MCV 102.0 (*)    MCHC 29.8 (*)    Neutro Abs 8.5 (*)    All other components within normal limits  CULTURE, BLOOD (ROUTINE X 2)  CULTURE, BLOOD (ROUTINE X 2)  PROTIME-INR  URINALYSIS, ROUTINE W REFLEX MICROSCOPIC                                                                                                                         EKG  EKG Interpretation  Date/Time:    Ventricular Rate:    PR Interval:    QRS Duration:   QT Interval:    QTC Calculation:   R Axis:     Text Interpretation:        Radiology No results found.  Pertinent labs & imaging results that were available during my care of the patient were reviewed by me and considered in my medical decision making (see chart for details).  Medications Ordered in ED Medications  sodium chloride 0.9 % bolus  500 mL (0 mLs Intravenous Stopped 02/25/20 0409)  cefTRIAXone (ROCEPHIN) 1 g in sodium chloride 0.9 % 100 mL IVPB (0 g Intravenous Stopped 02/25/20 0401)  sodium chloride 0.9 % bolus 500 mL (500 mLs Intravenous New Bag/Given 02/25/20 0558)                                                                                                                                    Procedures Procedures  (including critical care time)  Medical Decision Making / ED Course I have reviewed the nursing notes for this encounter and the patient's prior records (if available in EHR or on provided paperwork).   Kelsey Mclaughlin was evaluated in Emergency Department on 02/25/2020 for the symptoms described in the history of present illness. She was evaluated in the context of the global COVID-19 pandemic, which necessitated consideration that the patient might be at risk for infection with the SARS-CoV-2  virus that causes COVID-19. Institutional protocols and algorithms that pertain to the evaluation of patients at risk for COVID-19 are in a state of rapid change based on information released by regulatory bodies including the CDC and federal and state organizations. These policies and algorithms were followed during the patient's care in the ED.  Left arm pain and swelling with hyperemia. Lymphedema vs cellulitis vs DVT vs CRPS  Labs with leukocytosis and elevated lactic. She is AFVSS. IVF and Rocephin given for possible cellulitis. Hyperemia improved. Lactic down trending.  Will get DVT US. If negative, Rx keflex. Patient already has PCP appt for this afternoon.  Patient care turned over to Dr Vanita Panda. Patient case and results discussed in detail; please see their note for further ED managment.          Final Clinical Impression(s) / ED Diagnoses Final diagnoses:  None      This chart was dictated using voice recognition software.  Despite best efforts to proofread,  errors can occur which can change the documentation meaning.   Fatima Blank, MD 02/25/20 0700

## 2020-02-25 NOTE — Progress Notes (Signed)
VASCULAR LAB PRELIMINARY  PRELIMINARY  PRELIMINARY  PRELIMINARY  Left upper extremity venous duplex completed.    Preliminary report:  See CV proc for preliminary results.  Gave Dr. Vanita Panda results.  Kelsey Mclaughlin, RVT 02/25/2020, 9:58 AM

## 2020-02-25 NOTE — ED Provider Notes (Signed)
9:11 AM Patient awake, alert, sitting upright.  Ultrasound results reviewed, negative for DVT.  Patient has completed antibiotics.  Patient appropriate for discharge with same-day follow-up, likely diagnosis of cellulitis.   Carmin Muskrat, MD 02/25/20 [id number]

## 2020-03-01 LAB — CULTURE, BLOOD (ROUTINE X 2)
Culture: NO GROWTH
Culture: NO GROWTH
Special Requests: ADEQUATE

## 2020-03-03 ENCOUNTER — Other Ambulatory Visit: Payer: Self-pay | Admitting: Family Medicine

## 2020-03-03 ENCOUNTER — Ambulatory Visit
Admission: RE | Admit: 2020-03-03 | Discharge: 2020-03-03 | Disposition: A | Discharge status: Home / Self Care | Payer: Federal, State, Local not specified - PPO | Source: Ambulatory Visit | Attending: Family Medicine | Admitting: Family Medicine

## 2020-03-03 DIAGNOSIS — G8929 Other chronic pain: Secondary | ICD-10-CM

## 2020-03-03 DIAGNOSIS — M7989 Other specified soft tissue disorders: Secondary | ICD-10-CM | POA: Diagnosis not present

## 2020-03-12 DIAGNOSIS — L900 Lichen sclerosus et atrophicus: Secondary | ICD-10-CM | POA: Diagnosis not present

## 2020-03-12 DIAGNOSIS — Z01411 Encounter for gynecological examination (general) (routine) with abnormal findings: Secondary | ICD-10-CM | POA: Diagnosis not present

## 2020-03-19 ENCOUNTER — Other Ambulatory Visit: Payer: Self-pay | Admitting: Family Medicine

## 2020-05-01 ENCOUNTER — Inpatient Hospital Stay: Payer: Federal, State, Local not specified - PPO | Attending: Oncology | Admitting: Oncology

## 2020-05-01 ENCOUNTER — Inpatient Hospital Stay: Payer: Federal, State, Local not specified - PPO

## 2020-05-01 ENCOUNTER — Other Ambulatory Visit: Payer: Self-pay

## 2020-05-01 ENCOUNTER — Other Ambulatory Visit: Payer: Self-pay | Admitting: *Deleted

## 2020-05-01 VITALS — BP 132/69 | HR 61 | Temp 97.3°F | Resp 20 | Ht 67.0 in | Wt 192.0 lb

## 2020-05-01 DIAGNOSIS — Z923 Personal history of irradiation: Secondary | ICD-10-CM | POA: Insufficient documentation

## 2020-05-01 DIAGNOSIS — Z170 Estrogen receptor positive status [ER+]: Secondary | ICD-10-CM | POA: Diagnosis not present

## 2020-05-01 DIAGNOSIS — Z853 Personal history of malignant neoplasm of breast: Secondary | ICD-10-CM | POA: Insufficient documentation

## 2020-05-01 DIAGNOSIS — M8580 Other specified disorders of bone density and structure, unspecified site: Secondary | ICD-10-CM | POA: Diagnosis not present

## 2020-05-01 DIAGNOSIS — C50412 Malignant neoplasm of upper-outer quadrant of left female breast: Secondary | ICD-10-CM

## 2020-05-01 DIAGNOSIS — I890 Lymphedema, not elsewhere classified: Secondary | ICD-10-CM | POA: Insufficient documentation

## 2020-05-01 DIAGNOSIS — R102 Pelvic and perineal pain: Secondary | ICD-10-CM | POA: Diagnosis not present

## 2020-05-01 DIAGNOSIS — C50919 Malignant neoplasm of unspecified site of unspecified female breast: Secondary | ICD-10-CM

## 2020-05-01 DIAGNOSIS — Z9221 Personal history of antineoplastic chemotherapy: Secondary | ICD-10-CM | POA: Diagnosis not present

## 2020-05-01 LAB — CBC WITH DIFFERENTIAL (CANCER CENTER ONLY)
Abs Immature Granulocytes: 0.03 10*3/uL (ref 0.00–0.07)
Basophils Absolute: 0.1 10*3/uL (ref 0.0–0.1)
Basophils Relative: 1 %
Eosinophils Absolute: 0.3 10*3/uL (ref 0.0–0.5)
Eosinophils Relative: 4 %
HCT: 39.3 % (ref 36.0–46.0)
Hemoglobin: 13 g/dL (ref 12.0–15.0)
Immature Granulocytes: 1 %
Lymphocytes Relative: 24 %
Lymphs Abs: 1.6 10*3/uL (ref 0.7–4.0)
MCH: 29.9 pg (ref 26.0–34.0)
MCHC: 33.1 g/dL (ref 30.0–36.0)
MCV: 90.3 fL (ref 80.0–100.0)
Monocytes Absolute: 0.7 10*3/uL (ref 0.1–1.0)
Monocytes Relative: 11 %
Neutro Abs: 3.9 10*3/uL (ref 1.7–7.7)
Neutrophils Relative %: 59 %
Platelet Count: 339 10*3/uL (ref 150–400)
RBC: 4.35 MIL/uL (ref 3.87–5.11)
RDW: 12.5 % (ref 11.5–15.5)
WBC Count: 6.6 10*3/uL (ref 4.0–10.5)
nRBC: 0 % (ref 0.0–0.2)

## 2020-05-01 LAB — CMP (CANCER CENTER ONLY)
ALT: 72 U/L — ABNORMAL HIGH (ref 0–44)
AST: 47 U/L — ABNORMAL HIGH (ref 15–41)
Albumin: 3.6 g/dL (ref 3.5–5.0)
Alkaline Phosphatase: 70 U/L (ref 38–126)
Anion gap: 9 (ref 5–15)
BUN: 12 mg/dL (ref 6–20)
CO2: 25 mmol/L (ref 22–32)
Calcium: 10 mg/dL (ref 8.9–10.3)
Chloride: 105 mmol/L (ref 98–111)
Creatinine: 0.71 mg/dL (ref 0.44–1.00)
GFR, Est AFR Am: >60 mL/min (ref >60)
GFR, Estimated: >60 mL/min (ref >60)
Glucose, Bld: 98 mg/dL (ref 70–99)
Potassium: 4.1 mmol/L (ref 3.5–5.1)
Sodium: 139 mmol/L (ref 135–145)
Total Bilirubin: 0.3 mg/dL (ref 0.3–1.2)
Total Protein: 7 g/dL (ref 6.5–8.1)

## 2020-05-01 MED ORDER — CEPHALEXIN 500 MG PO CAPS
500.0000 mg | ORAL_CAPSULE | Freq: Two times a day (BID) | ORAL | 0 refills | Status: DC
Start: 2020-05-01 — End: 2020-07-25

## 2020-05-01 NOTE — Progress Notes (Signed)
[facility]  [phone] [phone]     ID: Kelsey Mclaughlin Kelsey Mclaughlin OB: Sep 01, 1961  MR#: [id number]  KCM#:[id number]  Patient Care Team: Kelsey Prose, MD as PCP - General (Family Medicine) Kelsey Mink, MD as Referring Physician (Family Medicine) Kelsey Limbo, MD as Consulting Physician (Plastic Surgery) Kelsey Mclaughlin, Virgie Dad, MD as Consulting Physician (Oncology) Kelsey Buff, MD as Referring Physician (Obstetrics and Gynecology) OTHER MD: Kelsey Grant MD, Kelsey May MD   CHIEF COMPLAINT: Breast cancer, recurrent, estrogen receptor positive  CURRENT TREATMENT: Completing 5 years of anastrozole November   INTERVAL HISTORY: Kelsey Mclaughlin returns today for follow-up of her estrogen receptor positive breast cancer.   She continues on anastrozole.  She tolerates this well with minimal hot flashes  Kelsey Mclaughlin's last bone density screening on 11/24/2017, showed a T-score of -1.6, which is considered osteopenic.    Her most recent mammogram on February 2021 showed breast density category B, no evidence of active disease    REVIEW OF SYSTEMS: Kelsey Mclaughlin has worsening left upper extremity lymphedema.  They do have a lymphedema pump which they use but the arm has nevertheless gotten larger.  It has also been erythematous.  She has run through courses of doxycycline and cephalexin.  Her compression sleeve no longer fits.  Aside from that she is having significant pelvic pain.  This is fairly constant.  It is not superficial but deep.  She and I will need to have both had the Covid vaccine and Kelsey Mclaughlin continues to be very active in gardening and of course she has about 50 chickens and 20 ducks and now 4 horses.  A detailed review of systems today was otherwise stable   BREAST CANCER HISTORY: From doctor Kelsey Mclaughlin's intake note 05/69/7948:  "Kelsey Mclaughlin is a 59 y.o. female. In 1998 at the age of 68 was diagnosed with left breast cancer in [place].  This was an invasive carcinoma grade 2 stage II. At that time she underwent a lumpectomy with excellent lymph node dissection. She received 6 months of chemotherapy. [name] She also had radiation therapy to the left breast. 2011 patient noticed a lump in the outside of her left breast. She had ultrasound workup performed and was told it was negative no biopsies were performed. General 2014 after patient moved to the [place] she had a mammogram performed this revealed a possible mass in the outer quadrant of the left breast. Ultrasound showed it to be 1.7 cm. MRI of the breasts performed on January 30 revealed the mass to be 1.5 x 2.2 x 1.5 cm. Because of this she also had a biopsy performed on 10/04/2013. The biopsy revealed [[id number]) an invasive ductal carcinoma with ductal carcinoma in situ grade 2. Prognostic panel was positive for estrogen receptor 100% megestrol receptor +33% proliferation marker Ki-67 15% and the tumor was HER-2/neu positive" [with a signals ratio of 3.1 and number per cell 6.3]  The patient's subsequent history is as detailed below   PAST MEDICAL HISTORY: Past Medical History:  Diagnosis Date  . Bronchitis    completed antibiotics 10/21/13/ states resolved  . Cancer ([place]) 1998, 2015   Left Breast  . Chronic diastolic (congestive) heart failure ([place])   . Hypertension   . Lymph edema    lt arm  . Stroke [facility]) 2004   TIA-  no problems since    PAST SURGICAL HISTORY: Past Surgical History:  Procedure Laterality Date  . ABDOMINOPLASTY/PANNICULECTOMY    . BREAST IMPLANT EXCHANGE Left 09/03/2014   Procedure: LEFT REMOVAL TISSUE EXPANDERS WITH PLACEMENT OF SILICONE IMPLANT ;  Surgeon: Kelsey Limbo, MD;  Location: [place];  Service: Plastics;  Laterality: Left;  . BREAST RECONSTRUCTION Left 03/07/2015   Procedure: LEFT NIPPLE AEROLA CREATION WITH LOCAL FLAP/FULL THICKNESS SKIN GRAFT  FROM GROIN;  Surgeon: Kelsey Limbo, MD;  Location: [place];  Service: Plastics;  Laterality: Left;  . BREAST SURGERY  1998   lumpectomy on left and removed lymphnodes in [place]  . HYDRADENITIS EXCISION Left 05/30/2018   Procedure: Adjacent tissue transfer left axilla;  Surgeon: Kelsey Limbo, MD;  Location: [place];  Service: Plastics;  Laterality: Left;  . LATISSIMUS FLAP TO BREAST Left 04/12/2014   Procedure: LEFT LATISSIMUS DORSI FLAP FOR LEFT BREAST RECONSTRUCTION AND PLACEMENT OF TISSUE EXPANDER;  Surgeon: Kelsey Limbo, MD;  Location: [place];  Service: Plastics;  Laterality: Left;  . LIPOSUCTION WITH LIPOFILLING Left 09/03/2014   Procedure:  LIPOFILLING TO LEFT CHEST  ;  Surgeon: Kelsey Limbo, MD;  Location: [place];  Service: Plastics;  Laterality: Left;  . LIPOSUCTION WITH LIPOFILLING Left 03/07/2015   Procedure: LIPOFILLING TO LEFT BREAST;  Surgeon: Kelsey Limbo, MD;  Location: [place];  Service: Plastics;  Laterality: Left;  Marland Kitchen MASTECTOMY Left 2015  . MASTOPEXY Right 09/03/2014   Procedure: RIGHT BREAST MASTOPEXY ;  Surgeon: Kelsey Limbo, MD;  Location: [place];  Service: Plastics;  Laterality: Right;  . PORT-A-CATH REMOVAL Right 12/24/2014   Procedure: REMOVAL PORT-A-CATH;  Surgeon: Donnie Mesa, MD;  Location: [place];  Service: General;  Laterality: Right;  . PORTACATH PLACEMENT Right 10/25/2013   Procedure: INSERTION PORT-A-CATH;  Surgeon: Imogene Burn. Georgette Dover, MD;  Location: [facility];  Service: General;  Laterality: Right;  . REDUCTION MAMMAPLASTY Right   . SIMPLE MASTECTOMY WITH AXILLARY SENTINEL NODE BIOPSY Left 04/12/2014   Procedure: MASTECTOMY;  Surgeon: Imogene Burn. Georgette Dover, MD;  Location: [place];  Service: General;  Laterality: Left;  . TISSUE EXPANDER PLACEMENT Left 04/12/2014   Procedure: TISSUE EXPANDER;  Surgeon: Kelsey Limbo, MD;  Location: [place];   Service: Plastics;  Laterality: Left;  . TISSUE EXPANDER PLACEMENT Left 05/22/2014   Procedure: PLACEMENT OF TISSUE EXPANDER/I&D WASHOUT SEROMA;  Surgeon: Kelsey Limbo, MD;  Location: [place];  Service: Plastics;  Laterality: Left;    FAMILY HISTORY Family History  Problem Relation Age of Onset  . Breast cancer Mother 70  . Heart disease Father   . Heart attack Father   . Breast cancer Maternal Grandmother 72  . Brain cancer Maternal Uncle 74       benign brain tumor  . Heart attack Paternal Aunt     GYNECOLOGIC HISTORY:   menarche age 49, first live birth age 11. The patient is GX P2. She went through the change of life approximately 2011.    SOCIAL HISTORY:  The patient worked previously as a Marine scientist. She has 2 children both of whom live in [place], works for the [employer], and [employer], a Librarian, academic. They are in their early 64s. There are no grandchildren. The patient married [name] (both women are originally from [place]) in [place]. [place], 40+ chickens and 2 horses. They're not church attenders    ADVANCED DIRECTIVES: in place   HEALTH MAINTENANCE: Social History  Tobacco Use  . Smoking status: Never Smoker  . Smokeless tobacco: Never Used  Substance Use Topics  . Alcohol use: Yes    Comment: 2 drinks/day  . Drug use: No     Colonoscopy:  PAP: Mclaughlin 2017    Bone density:  11/24/2017 showed a T score of  -1.6 osteopenia  Lipid panel:  Allergies  Allergen Reactions  . Adhesive [Tape] Rash    Band-aids    Current Outpatient Medications  Medication Sig Dispense Refill  . anastrozole (ARIMIDEX) 1 MG tablet TAKE 1 TABLET BY MOUTH EVERY DAY (Patient not taking: Reported on 02/25/2020) 90 tablet 3  . cholecalciferol (VITAMIN D) 25 MCG (1000 UT) tablet TAKE 1 TABLET BY MOUTH EVERY DAY (Patient not taking: Reported on 02/25/2020) 100 tablet 4  . desvenlafaxine (PRISTIQ) 50 MG 24 hr tablet Take 50 mg by  mouth daily.    Marland Kitchen losartan-hydrochlorothiazide (HYZAAR) 100-12.5 MG tablet     . sulfamethoxazole-trimethoprim (BACTRIM DS) 800-160 MG tablet Take 1 tablet by mouth 2 (two) times daily. (Patient not taking: Reported on 02/25/2020) 14 tablet 1   No current facility-administered medications for this visit.    OBJECTIVE: White woman who appears younger than stated age  25:   05/01/20 1423  BP: 132/69  Pulse: 61  Resp: 20  Temp: (!) 97.3 F (36.3 C)  SpO2: 99%     Body mass index is 30.07 kg/m.    Filed Weights   05/01/20 1423  Weight: 192 lb (87.1 kg)    ECOG FS:1 - Symptomatic but completely ambulatory   Sclerae unicteric, EOMs intact Wearing a mask No cervical or supraclavicular adenopathy Lungs no rales or rhonchi Heart regular rate and rhythm Abd soft, nontender, positive bowel sounds MSK no focal spinal tenderness, no upper extremity lymphedema Neuro: nonfocal, well oriented, appropriate affect Breasts: The right breast is status post reduction mammoplasty.  The left breast is status post mastectomy with reconstruction.  There is no evidence of local recurrence.  Both axillae are benign.   LAB RESULTS:  CMP     Component Value Date/Time   NA 139 05/01/2020 1353   NA 140 02/16/2017 1323   K 4.1 05/01/2020 1353   K 3.7 02/16/2017 1323   CL 105 05/01/2020 1353   CO2 25 05/01/2020 1353   CO2 26 02/16/2017 1323   GLUCOSE 98 05/01/2020 1353   GLUCOSE 113 02/16/2017 1323   BUN 12 05/01/2020 1353   BUN 12.8 02/16/2017 1323   CREATININE 0.71 05/01/2020 1353   CREATININE 0.8 02/16/2017 1323   CALCIUM 10.0 05/01/2020 1353   CALCIUM 9.7 02/16/2017 1323   PROT 7.0 05/01/2020 1353   PROT 6.9 02/16/2017 1323   ALBUMIN 3.6 05/01/2020 1353   ALBUMIN 4.1 02/16/2017 1323   AST 47 (H) 05/01/2020 1353   AST 34 02/16/2017 1323   ALT 72 (H) 05/01/2020 1353   ALT 58 (H) 02/16/2017 1323   ALKPHOS 70 05/01/2020 1353   ALKPHOS 107 02/16/2017 1323   BILITOT 0.3 05/01/2020  1353   BILITOT 0.28 02/16/2017 1323   GFRNONAA >60 05/01/2020 1353   GFRAA >60 05/01/2020 1353    I No results found for: SPEP  Lab Results  Component Value Date   WBC 6.6 05/01/2020   NEUTROABS 3.9 05/01/2020   HGB 13.0 05/01/2020   HCT 39.3 05/01/2020   MCV 90.3 05/01/2020   PLT 339 05/01/2020      Chemistry      Component Value Date/Time  NA 139 05/01/2020 1353   NA 140 02/16/2017 1323   K 4.1 05/01/2020 1353   K 3.7 02/16/2017 1323   CL 105 05/01/2020 1353   CO2 25 05/01/2020 1353   CO2 26 02/16/2017 1323   BUN 12 05/01/2020 1353   BUN 12.8 02/16/2017 1323   CREATININE 0.71 05/01/2020 1353   CREATININE 0.8 02/16/2017 1323      Component Value Date/Time   CALCIUM 10.0 05/01/2020 1353   CALCIUM 9.7 02/16/2017 1323   ALKPHOS 70 05/01/2020 1353   ALKPHOS 107 02/16/2017 1323   AST 47 (H) 05/01/2020 1353   AST 34 02/16/2017 1323   ALT 72 (H) 05/01/2020 1353   ALT 58 (H) 02/16/2017 1323   BILITOT 0.3 05/01/2020 1353   BILITOT 0.28 02/16/2017 1323       No results found for: LABCA2  No components found for: LABCA125  No results for input(s): INR in the last 168 hours.  Urinalysis    Component Value Date/Time   COLORURINE YELLOW 04/05/2016 2040   APPEARANCEUR CLEAR 04/05/2016 2040   LABSPEC 1.027 04/05/2016 2040   LABSPEC 1.005 05/22/2014 1130   PHURINE 7.0 04/05/2016 2040   GLUCOSEU NEGATIVE 04/05/2016 2040   GLUCOSEU Negative 05/22/2014 1130   HGBUR NEGATIVE 04/05/2016 2040   BILIRUBINUR NEGATIVE 04/05/2016 2040   BILIRUBINUR Negative 05/22/2014 1130   KETONESUR NEGATIVE 04/05/2016 2040   PROTEINUR 100 (A) 04/05/2016 2040   UROBILINOGEN 0.2 05/22/2014 1130   NITRITE NEGATIVE 04/05/2016 2040   LEUKOCYTESUR NEGATIVE 04/05/2016 2040   LEUKOCYTESUR Negative 05/22/2014 1130    STUDIES: No results found.   ASSESSMENT: 59 y.o. BRCA negative [place] woman, [place] speaker   (1) status post left lumpectomy and axillary lymph node  dissection in 1998 in [place] for a stage II invasive ductal carcinoma, grade 2, treated with adjuvant chemotherapy (specific drugs not clear) and adjuvant radiation.  (2) status post left breast upper outer quadrant biopsy 10/04/2013 for a clinically mT2 N0, stage IIA invasive ductal carcinoma, grade 2, estrogen receptor 100% positive, progesterone receptor 33% positive, with an MIB-1 of 15%, and HER-2 amplified; staging studies showed no evidence of metastatic disease  (3) started on neoadjuvant carboplatin/ docetaxel/ trastuzumab/ pertuzumab 11/12/2013  (a) completed 4 cycles as of 05/06/201, after which docetaxel was discontinued because of neuropathy symptoms   (b) gemcitabine was given day 1 and day 8 with carboplatin day 1 in the final 2 cycles, completed 03/04/2014   (c)  completed a year of trastuzumab March 2016--  Final echo 10/21/2014 shows a normal EF  (4) left mastectomy  04/12/2014 showed residual mypT1c ypNX invasive ductal carcinoma, grade 2, estrogen and progesterone receptor positive, HER-2 negative, with negative margins  (5) anastrozole started 07/14/2014, discontinued November 2020  (a) bone density 07/29/2014 shows T score - 1.2 (mild osteopenia)  (b) bone density 11/24/2017 showed a T score of  -1.6 osteopenia  (6) 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   PLAN: Fatina is now 6 years out from definitive surgery for her breast cancer with no evidence of disease recurrence.  This is favorable.  I am concerned about the worsening left upper extremity lymphedema.  Certainly this could be due to her surgery (she had axillary lymph node dissection in 1998 in [place]) but why it would be worse now is not so clear.  I think we have to obtain a CT of the chest to evaluate that further and make sure there is no  recurrent cancer there.  She is also having persistent pelvic pain.  She is not having any discharge or fever.  This is of  concern as well and I am obtaining a CT of the abdomen and pelvis to evaluate this further.  Assuming there is no obvious pathology she will be referred to physical therapy for wrapping of the left arm, developing of better lymphedema control, and new compression sleeve and teaching them how to use their lymphedema machine.  I did refill her cephalexin today which she will take twice a day for the next month.  We also discussed her fatty liver.  I recommend that she avoid carbohydrates.  They have a good understanding of this diet.  We will see what her labs show when she returns to see me a year from now  She knows to call for any other issue that Mclaughlin develop before then.  Total encounter time 30 minutes.   Magrinat, Virgie Dad, MD  05/01/20 2:28 PM Medical Oncology and Hematology [facility] [address], [place] [address] Tel. [phone]    Fax. [phone]   I, Wilburn Mylar, am acting as scribe for Dr. Virgie Dad. Magrinat.  I, Lurline Del MD, have reviewed the above documentation for accuracy and completeness, and I agree with the above.   *Total Encounter Time as defined by the Centers for Medicare and Medicaid Services includes, in addition to the face-to-face time of a patient visit (documented in the note above) non-face-to-face time: obtaining and reviewing outside history, ordering and reviewing medications, tests or procedures, care coordination (communications with other health care professionals or caregivers) and documentation in the medical record.

## 2020-05-02 ENCOUNTER — Telehealth: Payer: Self-pay | Admitting: Oncology

## 2020-05-02 NOTE — Telephone Encounter (Signed)
Scheduled appts per 8/19 los. Left voicemail with appt date and time.

## 2020-05-05 ENCOUNTER — Ambulatory Visit: Payer: Federal, State, Local not specified - PPO | Attending: Oncology | Admitting: Physical Therapy

## 2020-05-05 ENCOUNTER — Other Ambulatory Visit: Payer: Self-pay

## 2020-05-05 ENCOUNTER — Encounter: Payer: Self-pay | Admitting: Physical Therapy

## 2020-05-05 DIAGNOSIS — I972 Postmastectomy lymphedema syndrome: Secondary | ICD-10-CM | POA: Diagnosis not present

## 2020-05-05 DIAGNOSIS — R293 Abnormal posture: Secondary | ICD-10-CM | POA: Diagnosis not present

## 2020-05-05 NOTE — Therapy (Signed)
[place], [place], [address] Phone: [phone]   Fax:  [phone]  Physical Therapy Evaluation  Patient Details  Name: Kelsey Mclaughlin MRN: [id number] Date of Birth: November 27, 1960 Referring Provider (PT): Magrinat   Encounter Date: 05/05/2020   PT End of Session - 05/05/20 0842    Visit Number 1    Number of Visits 19    Date for PT Re-Evaluation 07/07/20   will start on 9/13   PT Start Time 0805    PT Stop Time 0840    PT Time Calculation (min) 35 min    Activity Tolerance Patient tolerated treatment well    Behavior During Therapy [facility] for tasks assessed/performed           Past Medical History:  Diagnosis Date  . Bronchitis    completed antibiotics 10/21/13/ states resolved  . Cancer ([place]) 1998, 2015   Left Breast  . Chronic diastolic (congestive) heart failure ([place])   . Hypertension   . Lymph edema    lt arm  . Stroke [facility]) 2004   TIA-  no problems since    Past Surgical History:  Procedure Laterality Date  . ABDOMINOPLASTY/PANNICULECTOMY    . BREAST IMPLANT EXCHANGE Left 09/03/2014   Procedure: LEFT REMOVAL TISSUE EXPANDERS WITH PLACEMENT OF SILICONE IMPLANT ;  Surgeon: Irene Limbo, MD;  Location: [place];  Service: Plastics;  Laterality: Left;  . BREAST RECONSTRUCTION Left 03/07/2015   Procedure: LEFT NIPPLE AEROLA CREATION WITH LOCAL FLAP/FULL THICKNESS SKIN GRAFT FROM GROIN;  Surgeon: Irene Limbo, MD;  Location: [place];  Service: Plastics;  Laterality: Left;  . BREAST SURGERY  1998   lumpectomy on left and removed lymphnodes in [place]  . HYDRADENITIS EXCISION Left 05/30/2018   Procedure: Adjacent tissue transfer left axilla;  Surgeon: Irene Limbo, MD;  Location: [place];  Service: Plastics;  Laterality: Left;  . LATISSIMUS FLAP TO BREAST Left 04/12/2014   Procedure: LEFT LATISSIMUS DORSI FLAP FOR LEFT  BREAST RECONSTRUCTION AND PLACEMENT OF TISSUE EXPANDER;  Surgeon: Irene Limbo, MD;  Location: [place];  Service: Plastics;  Laterality: Left;  . LIPOSUCTION WITH LIPOFILLING Left 09/03/2014   Procedure:  LIPOFILLING TO LEFT CHEST  ;  Surgeon: Irene Limbo, MD;  Location: [place];  Service: Plastics;  Laterality: Left;  . LIPOSUCTION WITH LIPOFILLING Left 03/07/2015   Procedure: LIPOFILLING TO LEFT BREAST;  Surgeon: Irene Limbo, MD;  Location: [place];  Service: Plastics;  Laterality: Left;  Marland Kitchen MASTECTOMY Left 2015  . MASTOPEXY Right 09/03/2014   Procedure: RIGHT BREAST MASTOPEXY ;  Surgeon: Irene Limbo, MD;  Location: [place];  Service: Plastics;  Laterality: Right;  . PORT-A-CATH REMOVAL Right 12/24/2014   Procedure: REMOVAL PORT-A-CATH;  Surgeon: Donnie Mesa, MD;  Location: [place];  Service: General;  Laterality: Right;  . PORTACATH PLACEMENT Right 10/25/2013   Procedure: INSERTION PORT-A-CATH;  Surgeon: Imogene Burn. Georgette Dover, MD;  [facility];  Service: General;  Laterality: Right;  . REDUCTION MAMMAPLASTY Right   . SIMPLE MASTECTOMY WITH AXILLARY SENTINEL NODE BIOPSY Left 04/12/2014   Procedure: MASTECTOMY;  Surgeon: Imogene Burn. Georgette Dover, MD;  Location: [place];  Service: General;  Laterality: Left;  . TISSUE EXPANDER PLACEMENT Left 04/12/2014   Procedure: TISSUE EXPANDER;  Surgeon: Irene Limbo, MD;  Location: [place];  Service: Plastics;  Laterality: Left;  . TISSUE EXPANDER PLACEMENT Left 05/22/2014  Procedure: PLACEMENT OF TISSUE EXPANDER/I&D WASHOUT SEROMA;  Surgeon: Irene Limbo, MD;  Location: [place];  Service: Plastics;  Laterality: Left;    There were no vitals filed for this visit.    Subjective Assessment - 05/05/20 0811    Subjective I still have the pump. She tries to use it regularly but sometimes it is hard. Unable to wear sleeve at this time because it is too tight. Pt has had an infection  in the LUE almost every 3 months. Pt reports she had scar tissue removed from left axilla in late 2019 and since that time her LUE swelling has been worse.    Patient is accompained by: Interpreter    Pertinent History mini stroke 2004, hx of L breast cancer with lumpectomy in 1998 and ALND- pt completed chemo and radiation in 1998, then she had a new cancer in her left breast in 2015 and underwent a mastectomy with lat flap reconstruction and has completed chemotherapy    Patient Stated Goals to get the swelling down    Currently in Pain? Yes    Pain Score 2     Pain Location Arm    Pain Orientation Left    Pain Descriptors / Indicators Aching    Pain Type Chronic pain    Pain Onset More than a month ago    Pain Frequency Constant    Aggravating Factors  when she gets cellulitis infection, when hand is down    Pain Relieving Factors ice    Effect of Pain on Daily Activities hard to use arm              [facility] PT Assessment - 05/05/20 0001      Assessment   Medical Diagnosis left breast cancer    Referring Provider (PT) Magrinat    Onset Date/Surgical Date 09/13/13    Hand Dominance Right    Prior Therapy previous lymphedema services at this clinic 2 years ago      Precautions   Precautions Other (comment)    Precaution Comments lymphedema LUE      Restrictions   Weight Bearing Restrictions No      Balance Screen   Has the patient fallen in the past 6 months Yes    How many times? 1- slipped on ground    Has the patient had a decrease in activity level because of a fear of falling?  No    Is the patient reluctant to leave their home because of a fear of falling?  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      Cognition   Overall Cognitive Status Within Functional Limits for tasks assessed      Observation/Other Assessments   Observations no cording palpable and pt has full ROM, L UE much larger than RUE      Posture/Postural Control   Posture/Postural Control Postural limitations    Postural Limitations Rounded Shoulders;Forward head  ROM / Strength   AROM / PROM / Strength AROM      AROM   Right Shoulder Flexion 161 Degrees    Right Shoulder ABduction 178 Degrees    Right Shoulder Internal Rotation 64 Degrees    Right Shoulder External Rotation 69 Degrees    Left Shoulder Flexion 171 Degrees    Left Shoulder ABduction 166 Degrees    Left Shoulder Internal Rotation 32 Degrees    Left Shoulder External Rotation 67 Degrees             LYMPHEDEMA/ONCOLOGY QUESTIONNAIRE - 05/05/20 0001      Type   Cancer Type left breast cancer      Surgeries   Mastectomy Date 09/13/13    Lumpectomy Date 02/11/97    Axillary Lymph Node Dissection Date 02/11/97    Other Surgery Date 05/30/18   sugery to remove scar tissue from L axilla   Number Lymph Nodes Removed 21      Date Lymphedema/Swelling Started   Date 02/11/97      Treatment   Active Chemotherapy Treatment No    Past Chemotherapy Treatment Yes    Date --   in 1998 and 2015   Active Radiation Treatment No    Past Radiation Treatment Yes    Date --   completed in 2998   Current Hormone Treatment No    Drug Name Anastrozole    Past Hormone Therapy Yes      What other symptoms do you have   Are you Having Heaviness or Tightness Yes    Are you having Pain Yes    Are you having pitting edema Yes    Body Site L hand    Is it Hard or Difficult finding clothes that fit Yes    Do you have infections Yes    Comments cellultis approx every 3 months since 2019    Is there  Decreased scar mobility No      Lymphedema Assessments   Lymphedema Assessments Upper extremities      Right Upper Extremity Lymphedema   15 cm Proximal to Olecranon Process 33.5 cm    Olecranon Process 30 cm    15 cm Proximal to Ulnar Styloid Process 27.7 cm    Just Proximal to Ulnar Styloid Process 19.2 cm    Across Hand at [employer] 21.5 cm    At [place] of 2nd Digit 7 cm      Left Upper Extremity Lymphedema   15 cm Proximal to Olecranon Process 35.5 cm    Olecranon Process 35.4 cm    15 cm Proximal to Ulnar Styloid Process 37.8 cm    Just Proximal to Ulnar Styloid Process 29 cm    Across Hand at [employer] 27 cm    At [place] of 2nd Digit 8.8 cm                   Objective measurements completed on examination: See above findings.               PT Education - 05/05/20 0853    Education Details lymphedema risk reduction practices, how cellulitis further damages lymphatic system, importance of 24/7 compression    Person(s) Educated Patient;Spouse    Methods Explanation;Handout    Comprehension Verbalized understanding               PT Long Term Goals - 05/05/20 0849      PT LONG TERM GOAL #1  Title Pt will demonstrate a 6 cm reduction in swelling at wrist to decrease risk of cellulitis.    Baseline 27    Time 6    Period Weeks    Status New    Target Date 07/07/20      PT LONG TERM GOAL #2   Title Pt will demonstrate a 7 cm reduction in edema 15 cm proximal to ulnar styloid process to decresae risk of lymphedema.    Baseline 37.8    Time 6    Period Weeks    Status New    Target Date 07/07/20      PT LONG TERM GOAL #3   Title Pt will obtain appropriate compression garments for long term management of lymphedema.    Time 6    Period Weeks    Status New    Target Date 07/07/20      PT LONG TERM GOAL #4   Title Pt and/or spouse will be independent in long term managment of lymphedema to decrease risk of cellulitis.    Time 6      Period Weeks    Status New    Target Date 07/07/20                  Plan - 05/05/20 0843    Clinical Impression Statement Pt presents to PT with exacerbation of LUE lymphedema. Pt underwent a lumectomy and ALND in 1998 on the left and then had a recurrence of left breast cancer in 2015 and underwent a left mastectomy and lat flap reconstruction. She completed radiation and chemo in 1998 and chemo in 2015. She had trouble with ROM of L axilla after the last reconstruction and the area was not healing. She underwent another surgery in 05/2018 to remove scar tissue in left axilla. Now pt presents with full ROM of L shoulder. She has marked swelling in LUE. Pt reports this worsened after the last surgery and she reports getting cellulitis nearly every 3 months since that time. Educated pt about how cellulitis futher damages the lymphatic system. Pt's forearm is 10 cm larger than it was in April 2019 and the wrist is 9 cm larger. Pt would benefit from skilled PT services to decrease LUE lymphedema through compression bandaging and assist pt with obtaining compression garments for long term management.    Personal Factors and Comorbidities Time since onset of injury/illness/exacerbation    Stability/Clinical Decision Making Stable/Uncomplicated    Clinical Decision Making Low    Rehab Potential Good    Clinical Impairments Affecting Rehab Potential hx of radiation, long standing hx of lymphedema    PT Frequency 3x / week    PT Duration 6 weeks   beginning Sept 13   PT Treatment/Interventions ADLs/Self Care Home Management;Therapeutic exercise;Therapeutic activities;Patient/family education;Manual techniques;Manual lymph drainage;Compression bandaging;Taping;Vasopneumatic Device;Passive range of motion;Scar mobilization    PT Next Visit Plan begin MLD and bandaging to LUE, eventually talk about compression garments, see if pt has been using pump daily    Consulted and Agree with Plan of Care  Patient;Family member/caregiver    Family Member Consulted spouse           Patient will benefit from skilled therapeutic intervention in order to improve the following deficits and impairments:  Pain, Postural dysfunction, Increased edema, Decreased knowledge of precautions  Visit Diagnosis: Postmastectomy lymphedema  Abnormal posture     Problem List Patient Active Problem List   Diagnosis Date Noted  . Bunion 10/11/2018  . Essential  hypertension 10/09/2018  . Depression   . Hypokalemia 04/06/2016  . Sepsis ([place]) 04/06/2016  . Cellulitis of left hand 04/06/2016  . Lymph edema   . Chronic diastolic (congestive) heart failure ([place])   . History of therapeutic radiation 01/14/2015  . Recurrent breast cancer ([place]) 04/12/2014  . Hepatic steatosis 02/01/2014  . Malignant neoplasm of upper-outer quadrant of left breast in female, estrogen receptor positive ([place]) 10/12/2013  . Stroke [facility]) 09/13/2002    Allyson Sabal [facility] 05/05/2020, 8:54 AM  [place], [place], [address] Phone: [phone]   Fax:  [phone]  Name: Kelsey Mclaughlin MRN: [id number] Date of Birth: [birth year]  Manus Gunning, PT 05/05/20 8:55 AM

## 2020-05-13 ENCOUNTER — Ambulatory Visit (HOSPITAL_COMMUNITY)
Admission: RE | Admit: 2020-05-13 | Discharge: 2020-05-13 | Disposition: A | Discharge status: Home / Self Care | Payer: Federal, State, Local not specified - PPO | Source: Ambulatory Visit | Attending: Oncology | Admitting: Oncology

## 2020-05-13 ENCOUNTER — Other Ambulatory Visit: Payer: Self-pay

## 2020-05-13 ENCOUNTER — Encounter (HOSPITAL_COMMUNITY): Payer: Self-pay

## 2020-05-13 DIAGNOSIS — C50412 Malignant neoplasm of upper-outer quadrant of left female breast: Secondary | ICD-10-CM | POA: Insufficient documentation

## 2020-05-13 DIAGNOSIS — Z170 Estrogen receptor positive status [ER+]: Secondary | ICD-10-CM | POA: Insufficient documentation

## 2020-05-13 DIAGNOSIS — C50919 Malignant neoplasm of unspecified site of unspecified female breast: Secondary | ICD-10-CM | POA: Diagnosis not present

## 2020-05-13 DIAGNOSIS — C50912 Malignant neoplasm of unspecified site of left female breast: Secondary | ICD-10-CM | POA: Diagnosis not present

## 2020-05-13 MED ORDER — SODIUM CHLORIDE (PF) 0.9 % IJ SOLN
INTRAMUSCULAR | Status: AC
  Filled 2020-05-13: qty 50

## 2020-05-13 MED ORDER — IOHEXOL 300 MG/ML  SOLN
100.0000 mL | Freq: Once | INTRAMUSCULAR | Status: AC | PRN
  Administered 2020-05-13: 100 mL via INTRAVENOUS

## 2020-05-14 ENCOUNTER — Other Ambulatory Visit: Payer: Self-pay | Admitting: Oncology

## 2020-05-14 DIAGNOSIS — C50912 Malignant neoplasm of unspecified site of left female breast: Secondary | ICD-10-CM

## 2020-05-14 NOTE — Progress Notes (Signed)
I called Kelsey Mclaughlin and speaking through [employer] let them know the good results of the CT scans.  There is no evidence of cancer.  We did a Doppler ultrasound of the left upper extremity in June which showed no clot.  I suggested we repeat that just to make 100% sure and they are agreeable.

## 2020-05-16 ENCOUNTER — Ambulatory Visit (HOSPITAL_COMMUNITY)
Admission: RE | Admit: 2020-05-16 | Discharge: 2020-05-16 | Disposition: A | Discharge status: Home / Self Care | Payer: Federal, State, Local not specified - PPO | Source: Ambulatory Visit | Attending: Oncology | Admitting: Oncology

## 2020-05-16 ENCOUNTER — Other Ambulatory Visit: Payer: Self-pay

## 2020-05-16 DIAGNOSIS — C50912 Malignant neoplasm of unspecified site of left female breast: Secondary | ICD-10-CM

## 2020-05-16 NOTE — Progress Notes (Signed)
Upper extremity venous study completed.   Please see CV Proc for preliminary results.   Kelsey Mclaughlin

## 2020-05-26 ENCOUNTER — Other Ambulatory Visit: Payer: Self-pay

## 2020-05-26 ENCOUNTER — Ambulatory Visit: Payer: Federal, State, Local not specified - PPO | Attending: Oncology

## 2020-05-26 DIAGNOSIS — I972 Postmastectomy lymphedema syndrome: Secondary | ICD-10-CM

## 2020-05-26 DIAGNOSIS — R293 Abnormal posture: Secondary | ICD-10-CM | POA: Insufficient documentation

## 2020-05-26 NOTE — Therapy (Signed)
[place], [place], [address] Phone: [phone]   Fax:  [phone]  Physical Therapy Treatment  Patient Details  Name: Kelsey Mclaughlin MRN: [id number] Date of Birth: 13-May-1961 Referring Provider (PT): Magrinat   Encounter Date: 05/26/2020   PT End of Session - 05/26/20 1531    Visit Number 2    Number of Visits 19    Date for PT Re-Evaluation 07/07/20    PT Start Time 6967    PT Stop Time 1502    PT Time Calculation (min) 54 min    Activity Tolerance Patient tolerated treatment well    Behavior During Therapy [facility] for tasks assessed/performed           Past Medical History:  Diagnosis Date  . Bronchitis    completed antibiotics 10/21/13/ states resolved  . Cancer ([place]) 1998, 2015   Left Breast  . Chronic diastolic (congestive) heart failure ([place])   . Hypertension   . Lymph edema    lt arm  . Stroke [facility]) 2004   TIA-  no problems since    Past Surgical History:  Procedure Laterality Date  . ABDOMINOPLASTY/PANNICULECTOMY    . BREAST IMPLANT EXCHANGE Left 09/03/2014   Procedure: LEFT REMOVAL TISSUE EXPANDERS WITH PLACEMENT OF SILICONE IMPLANT ;  Surgeon: Irene Limbo, MD;  Location: [place];  Service: Plastics;  Laterality: Left;  . BREAST RECONSTRUCTION Left 03/07/2015   Procedure: LEFT NIPPLE AEROLA CREATION WITH LOCAL FLAP/FULL THICKNESS SKIN GRAFT FROM GROIN;  Surgeon: Irene Limbo, MD;  Location: [place];  Service: Plastics;  Laterality: Left;  . BREAST SURGERY  1998   lumpectomy on left and removed lymphnodes in [place]  . HYDRADENITIS EXCISION Left 05/30/2018   Procedure: Adjacent tissue transfer left axilla;  Surgeon: Irene Limbo, MD;  Location: [place];  Service: Plastics;  Laterality: Left;  . LATISSIMUS FLAP TO BREAST Left 04/12/2014   Procedure: LEFT LATISSIMUS DORSI FLAP FOR LEFT BREAST RECONSTRUCTION AND  PLACEMENT OF TISSUE EXPANDER;  Surgeon: Irene Limbo, MD;  Location: [place];  Service: Plastics;  Laterality: Left;  . LIPOSUCTION WITH LIPOFILLING Left 09/03/2014   Procedure:  LIPOFILLING TO LEFT CHEST  ;  Surgeon: Irene Limbo, MD;  Location: [place];  Service: Plastics;  Laterality: Left;  . LIPOSUCTION WITH LIPOFILLING Left 03/07/2015   Procedure: LIPOFILLING TO LEFT BREAST;  Surgeon: Irene Limbo, MD;  Location: [place];  Service: Plastics;  Laterality: Left;  Marland Kitchen MASTECTOMY Left 2015  . MASTOPEXY Right 09/03/2014   Procedure: RIGHT BREAST MASTOPEXY ;  Surgeon: Irene Limbo, MD;  Location: [place];  Service: Plastics;  Laterality: Right;  . PORT-A-CATH REMOVAL Right 12/24/2014   Procedure: REMOVAL PORT-A-CATH;  Surgeon: Donnie Mesa, MD;  Location: [place];  Service: General;  Laterality: Right;  . PORTACATH PLACEMENT Right 10/25/2013   Procedure: INSERTION PORT-A-CATH;  Surgeon: Imogene Burn. Georgette Dover, MD;  [facility];  Service: General;  Laterality: Right;  . REDUCTION MAMMAPLASTY Right   . SIMPLE MASTECTOMY WITH AXILLARY SENTINEL NODE BIOPSY Left 04/12/2014   Procedure: MASTECTOMY;  Surgeon: Imogene Burn. Georgette Dover, MD;  Location: [place];  Service: General;  Laterality: Left;  . TISSUE EXPANDER PLACEMENT Left 04/12/2014   Procedure: TISSUE EXPANDER;  Surgeon: Irene Limbo, MD;  Location: [place];  Service: Plastics;  Laterality: Left;  . TISSUE EXPANDER PLACEMENT Left 05/22/2014   Procedure: PLACEMENT OF TISSUE EXPANDER/I&D  WASHOUT SEROMA;  Surgeon: Irene Limbo, MD;  Location: [place];  Service: Plastics;  Laterality: Left;    There were no vitals filed for this visit.   Subjective Assessment - 05/26/20 1410    Subjective Nothing new to report since I was here for evaluation.    Pertinent History mini stroke 2004, hx of L breast cancer with lumpectomy in 1998 and ALND- pt completed chemo and radiation in  1998, then she had a new cancer in her left breast in 2015 and underwent a mastectomy with lat flap reconstruction and has completed chemotherapy    Patient Stated Goals to get the swelling down    Currently in Pain? No/denies                       Outpatient Re[facility]05/[facility]  Lymphedema Life Impact Scale Total Score 61.76 %            [facility] Adult PT Treatment/Exercise - 05/26/20 0001      Manual Therapy   Manual Therapy Manual Lymphatic Drainage (MLD);Compression Bandaging    Manual Lymphatic Drainage (MLD) In Supine: Short neck, superficial and deep abdominals, Lt inguinal and Rt axillary nodes, Lt axillo-inguinal and anterior inter-axillary anastomsosis, then Lt UE: lateral upper arm, medial to lateral, and lateral upper arm again, ant/post foreram , and dorsal hand and fingers retracing all steps redirecting towards anastomosis reviewing with pt and wife throughout and having wife return demo of most steps of sequence. Hand over hand technique used for correct direction of stretch and VCs for lighter pressure.    Compression Bandaging Coconut oil applied to Lt UE then thin stockinette, Elastomull to all 5 fingers using 1.5 rolls, Artiflex x1, then 1-6 cm, 1-10 cm and 2-12 cm (first done in herring bone fashion) from hand to axilla also beginning to review with wife throughout.                   PT Education - 05/26/20 1501    Education Details Self MLD    Person(s) Educated Patient;Spouse    Methods Explanation;Demonstration;Handout    Comprehension Verbalized understanding;Returned demonstration;Tactile cues required;Need further instruction;Verbal cues required               PT Long Term Goals - 05/05/20 0849      PT LONG TERM GOAL #1   Title Pt will demonstrate a 6 cm reduction in swelling at wrist to decrease risk of cellulitis.    Baseline 27    Time 6    Period Weeks    Status New     Target Date 07/07/20      PT LONG TERM GOAL #2   Title Pt will demonstrate a 7 cm reduction in edema 15 cm proximal to ulnar styloid process to decresae risk of lymphedema.    Baseline 37.8    Time 6    Period Weeks    Status New    Target Date 07/07/20      PT LONG TERM GOAL #3   Title Pt will obtain appropriate compression garments for long term management of lymphedema.    Time 6    Period Weeks    Status New    Target Date 07/07/20      PT LONG TERM GOAL #4   Title Pt and/or spouse will be independent in long term managment of lymphedema to decrease risk of cellulitis.    Time 6  Period Weeks    Status New    Target Date 07/07/20                 Plan - 05/26/20 1531    Clinical Impression Statement First session of complete decongestive thearpy though pt has been through this before so reviewed all with pt and wife while performing today. Had wife return demo of manual lymph drainage and began review while performing compression bandaging. Also reviewed importance of remedial exercises while wearing bandages and if they fall off before next appt to bring htem back for reuse. They both vrbalized understanding and wife translated for pt.    Personal Factors and Comorbidities Time since onset of injury/illness/exacerbation    Stability/Clinical Decision Making Stable/Uncomplicated    Rehab Potential Good    Clinical Impairments Affecting Rehab Potential hx of radiation, long standing hx of lymphedema    PT Frequency 3x / week    PT Duration 6 weeks    PT Treatment/Interventions ADLs/Self Care Home Management;Therapeutic exercise;Therapeutic activities;Patient/family education;Manual techniques;Manual lymph drainage;Compression bandaging;Taping;Vasopneumatic Device;Passive range of motion;Scar mobilization    PT Next Visit Plan Cont MLD and review with pt and wife prn to assess technique, and cont compression bandaging to Lt UE, eventually talk about compression garments,  see if pt has been using pump daily. Instruct wife further in bandaging if she wants to learn and issue handout for this.    PT Home Exercise Plan Perform remedial exercises while wearing bandages    Consulted and Agree with Plan of Care Patient;Family member/caregiver    Family Member Consulted spouse           Patient will benefit from skilled therapeutic intervention in order to improve the following deficits and impairments:  Pain, Postural dysfunction, Increased edema, Decreased knowledge of precautions  Visit Diagnosis: Postmastectomy lymphedema  Abnormal posture     Problem List Patient Active Problem List   Diagnosis Date Noted  . Bunion 10/11/2018  . Essential hypertension 10/09/2018  . Depression   . Hypokalemia 04/06/2016  . Sepsis ([place]) 04/06/2016  . Cellulitis of left hand 04/06/2016  . Lymph edema   . Chronic diastolic (congestive) heart failure ([place])   . History of therapeutic radiation 01/14/2015  . Recurrent breast cancer ([place]) 04/12/2014  . Hepatic steatosis 02/01/2014  . Malignant neoplasm of upper-outer quadrant of left breast in female, estrogen receptor positive ([place]) 10/12/2013  . Stroke [facility]) 09/13/2002    Otelia Limes, PTA 05/26/2020, 3:35 PM  [place], [place], [address] Phone: [phone]   Fax:  [phone]  Name: Kelsey Mclaughlin MRN: [id number] Date of Birth: 1961-05-12

## 2020-05-26 NOTE — Patient Instructions (Signed)
Deep Effective Breath ° ° °Standing, sitting, or laying down, place both hands on the belly. Take a deep breath IN, expanding the belly; then breath OUT, contracting the belly. °Repeat __5__ times. Do __2-3__ sessions per day and before your self massage. ° °http://gt2.exer.us/866  ° °Copyright © VHI. All rights reserved.  °Axilla to Axilla - Sweep ° ° °On uninvolved side make 5 circles in the armpit, then pump _5__ times from involved armpit across chest to uninvolved armpit, making a pathway. °Do _1__ time per day. ° °Copyright © VHI. All rights reserved.  °Axilla to Inguinal Nodes - Sweep ° ° °On involved side, make 5 circles at groin at panty line, then pump _5__ times from armpit along side of trunk to outer hip, making your other pathway. °Do __1_ time per day. ° °Copyright © VHI. All rights reserved.  °Arm Posterior: Elbow to Shoulder - Sweep ° ° °Pump _5__ times from back of elbow to top of shoulder. Then inner to outer upper arm _5_ times, then outer arm again _5_ times. Then back to the pathways _2-3_ times. °Do _1__ time per day. ° °Copyright © VHI. All rights reserved.  °ARM: [place] Wrist to Elbow - Sweep ° ° °Pump or stationary circles _5__ times from wrist to elbow making sure to do both sides of the forearm. Then retrace your steps to the outer arm, and the pathways _2-3_ times each. °Do _1__ time per day. ° °Copyright © VHI. All rights reserved.  °ARM: Dorsum of Hand to Shoulder - Sweep ° ° °Pump or stationary circles _5__ times on back of hand including knuckle spaces and individual fingers if needed working up towards the wrist, then retrace all your steps working back up the forearm, doing both sides; upper outer arm and back to your pathways _2-3_ times each. Then do 5 circles again at uninvolved armpit and involved groin where you started! Good job!! °Do __1_ time per day. °

## 2020-05-28 ENCOUNTER — Encounter: Payer: Self-pay | Admitting: Physical Therapy

## 2020-05-28 ENCOUNTER — Ambulatory Visit: Payer: Federal, State, Local not specified - PPO | Admitting: Physical Therapy

## 2020-05-28 ENCOUNTER — Other Ambulatory Visit: Payer: Self-pay

## 2020-05-28 DIAGNOSIS — I972 Postmastectomy lymphedema syndrome: Secondary | ICD-10-CM | POA: Diagnosis not present

## 2020-05-28 DIAGNOSIS — R293 Abnormal posture: Secondary | ICD-10-CM | POA: Diagnosis not present

## 2020-05-28 NOTE — Therapy (Signed)
[place], [place], [address] Phone: [phone]   Fax:  [phone]  Physical Therapy Evaluation  Patient Details  Name: Kelsey Mclaughlin MRN: [id number] Date of Birth: Jul 29, 1961 Referring Provider (PT): Magrinat   Encounter Date: 05/28/2020   PT End of Session - 05/28/20 1453    Visit Number 3    Number of Visits 19    Date for PT Re-Evaluation 07/07/20    PT Start Time 4166    PT Stop Time 1451    PT Time Calculation (min) 48 min    Activity Tolerance Patient tolerated treatment well    Behavior During Therapy [facility] for tasks assessed/performed           Past Medical History:  Diagnosis Date  . Bronchitis    completed antibiotics 10/21/13/ states resolved  . Cancer ([place]) 1998, 2015   Left Breast  . Chronic diastolic (congestive) heart failure ([place])   . Hypertension   . Lymph edema    lt arm  . Stroke [facility]) 2004   TIA-  no problems since    Past Surgical History:  Procedure Laterality Date  . ABDOMINOPLASTY/PANNICULECTOMY    . BREAST IMPLANT EXCHANGE Left 09/03/2014   Procedure: LEFT REMOVAL TISSUE EXPANDERS WITH PLACEMENT OF SILICONE IMPLANT ;  Surgeon: Irene Limbo, MD;  Location: [place];  Service: Plastics;  Laterality: Left;  . BREAST RECONSTRUCTION Left 03/07/2015   Procedure: LEFT NIPPLE AEROLA CREATION WITH LOCAL FLAP/FULL THICKNESS SKIN GRAFT FROM GROIN;  Surgeon: Irene Limbo, MD;  Location: [place];  Service: Plastics;  Laterality: Left;  . BREAST SURGERY  1998   lumpectomy on left and removed lymphnodes in [place]  . HYDRADENITIS EXCISION Left 05/30/2018   Procedure: Adjacent tissue transfer left axilla;  Surgeon: Irene Limbo, MD;  Location: [place];  Service: Plastics;  Laterality: Left;  . LATISSIMUS FLAP TO BREAST Left 04/12/2014   Procedure: LEFT LATISSIMUS DORSI FLAP FOR LEFT BREAST RECONSTRUCTION AND  PLACEMENT OF TISSUE EXPANDER;  Surgeon: Irene Limbo, MD;  Location: [place];  Service: Plastics;  Laterality: Left;  . LIPOSUCTION WITH LIPOFILLING Left 09/03/2014   Procedure:  LIPOFILLING TO LEFT CHEST  ;  Surgeon: Irene Limbo, MD;  Location: [place];  Service: Plastics;  Laterality: Left;  . LIPOSUCTION WITH LIPOFILLING Left 03/07/2015   Procedure: LIPOFILLING TO LEFT BREAST;  Surgeon: Irene Limbo, MD;  Location: [place];  Service: Plastics;  Laterality: Left;  Marland Kitchen MASTECTOMY Left 2015  . MASTOPEXY Right 09/03/2014   Procedure: RIGHT BREAST MASTOPEXY ;  Surgeon: Irene Limbo, MD;  Location: [place];  Service: Plastics;  Laterality: Right;  . PORT-A-CATH REMOVAL Right 12/24/2014   Procedure: REMOVAL PORT-A-CATH;  Surgeon: Donnie Mesa, MD;  Location: [place];  Service: General;  Laterality: Right;  . PORTACATH PLACEMENT Right 10/25/2013   Procedure: INSERTION PORT-A-CATH;  Surgeon: Imogene Burn. Georgette Dover, MD;  Location: [facility];  Service: General;  Laterality: Right;  . REDUCTION MAMMAPLASTY Right   . SIMPLE MASTECTOMY WITH AXILLARY SENTINEL NODE BIOPSY Left 04/12/2014   Procedure: MASTECTOMY;  Surgeon: Imogene Burn. Georgette Dover, MD;  Location: [place];  Service: General;  Laterality: Left;  . TISSUE EXPANDER PLACEMENT Left 04/12/2014   Procedure: TISSUE EXPANDER;  Surgeon: Irene Limbo, MD;  Location: [place];  Service: Plastics;  Laterality: Left;  . TISSUE EXPANDER PLACEMENT Left 05/22/2014   Procedure: PLACEMENT OF TISSUE EXPANDER/I&D  WASHOUT SEROMA;  Surgeon: Irene Limbo, MD;  Location: [place];  Service: Plastics;  Laterality: Left;    There were no vitals filed for this visit.    Subjective Assessment - 05/28/20 1405    Subjective The bandages stayed on well except the fingers came off this morning.    Pertinent History mini stroke 2004, hx of L breast cancer with lumpectomy in 1998 and ALND- pt completed chemo and  radiation in 1998, then she had a new cancer in her left breast in 2015 and underwent a mastectomy with lat flap reconstruction and has completed chemotherapy    Patient Stated Goals to get the swelling down    Currently in Pain? No/denies    Pain Score 0-No pain                 LYMPHEDEMA/ONCOLOGY QUESTIONNAIRE - 05/28/20 0001      Left Upper Extremity Lymphedema   15 cm Proximal to Olecranon Process 33.5 cm    Olecranon Process 33 cm    15 cm Proximal to Ulnar Styloid Process 33 cm    Just Proximal to Ulnar Styloid Process 26 cm    Across Hand at [employer] 23.5 cm    At [place] of 2nd Digit 7.5 cm                   [facility]patient [facility]  Lymphedema Life Impact Scale Total Score 61.76 %      Objective measurements completed on examination: See above findings.       [place] Adult PT Treatment/Exercise - 05/28/20 0001      Manual Therapy   Manual Lymphatic Drainage (MLD) In Supine: Short neck, superficial and deep abdominals, Lt inguinal and Rt axillary nodes, Lt axillo-inguinal and anterior inter-axillary anastomsosis, then Lt UE: lateral upper arm, medial to lateral, and lateral upper arm again, ant/post foreram , and dorsal hand and fingers retracing all steps directing fluid towards pathways    Compression Bandaging Coconut oil applied to Lt UE then thin stockinette, Elastomull to all 5 fingers using 1.5 rolls, Artiflex x1, then 1-6 cm, 1-10 cm and 2-12 cm (first done in herring bone fashion) from hand to axilla                       PT Long Term Goals - 05/05/20 0849      PT LONG TERM GOAL #1   Title Pt will demonstrate a 6 cm reduction in swelling at wrist to decrease risk of cellulitis.    Baseline 27    Time 6    Period Weeks    Status New    Target Date 07/07/20      PT LONG TERM GOAL #2   Title Pt will demonstrate a 7 cm reduction in edema 15 cm proximal to ulnar styloid  process to decresae risk of lymphedema.    Baseline 37.8    Time 6    Period Weeks    Status New    Target Date 07/07/20      PT LONG TERM GOAL #3   Title Pt will obtain appropriate compression garments for long term management of lymphedema.    Time 6    Period Weeks    Status New    Target Date 07/07/20      PT LONG TERM GOAL #4   Title Pt and/or spouse will be independent in long term managment of  lymphedema to decrease risk of cellulitis.    Time 6    Period Weeks    Status New    Target Date 07/07/20                  Plan - 05/28/20 1453    Clinical Impression Statement Pt and spouse reported they did not have any questions about bandaging or self MLD. Circumference measurements taken today demonstrate a decrease of between 2 and 5 cm throughout LUE. Continued with MLD with focus on hand and forearm and then continued with compression bandaging.    PT Frequency 3x / week    PT Duration 6 weeks    PT Treatment/Interventions ADLs/Self Care Home Management;Therapeutic exercise;Therapeutic activities;Patient/family education;Manual techniques;Manual lymph drainage;Compression bandaging;Taping;Vasopneumatic Device;Passive range of motion;Scar mobilization    PT Next Visit Plan Cont MLD and review with pt and wife prn to assess technique, and cont compression bandaging to Lt UE, eventually talk about compression garments, see if pt has been using pump daily. Instruct wife further in bandaging if she wants to learn and issue handout for this.    PT Home Exercise Plan Perform remedial exercises while wearing bandages    Consulted and Agree with Plan of Care Patient;Family member/caregiver           Patient will benefit from skilled therapeutic intervention in order to improve the following deficits and impairments:  Pain, Postural dysfunction, Increased edema, Decreased knowledge of precautions  Visit Diagnosis: Postmastectomy lymphedema     Problem List Patient  Active Problem List   Diagnosis Date Noted  . Bunion 10/11/2018  . Essential hypertension 10/09/2018  . Depression   . Hypokalemia 04/06/2016  . Sepsis ([place]) 04/06/2016  . Cellulitis of left hand 04/06/2016  . Lymph edema   . Chronic diastolic (congestive) heart failure ([place])   . History of therapeutic radiation 01/14/2015  . Recurrent breast cancer ([place]) 04/12/2014  . Hepatic steatosis 02/01/2014  . Malignant neoplasm of upper-outer quadrant of left breast in female, estrogen receptor positive ([place]) 10/12/2013  . Stroke [facility]) 09/13/2002    Allyson Sabal [facility] 05/28/2020, 2:55 PM  [place], [place], [address] Phone: [phone]   Fax:  [phone]  Name: Kelsey Mclaughlin MRN: [id number] Date of Birth: 01/23/1961  Manus Gunning, PT 05/28/20 2:55 PM

## 2020-05-30 ENCOUNTER — Ambulatory Visit: Payer: Federal, State, Local not specified - PPO

## 2020-05-30 ENCOUNTER — Other Ambulatory Visit: Payer: Self-pay

## 2020-05-30 DIAGNOSIS — I972 Postmastectomy lymphedema syndrome: Secondary | ICD-10-CM | POA: Diagnosis not present

## 2020-05-30 DIAGNOSIS — R293 Abnormal posture: Secondary | ICD-10-CM | POA: Diagnosis not present

## 2020-05-30 NOTE — Therapy (Signed)
[place], [place], [address] Phone: [phone]   Fax:  [phone]  Physical Therapy Treatment  Patient Details  Name: Kelsey Mclaughlin MRN: [id number] Date of Birth: Feb 16, 1961 Referring Provider (PT): Magrinat   Encounter Date: 05/30/2020   PT End of Session - 05/30/20 1211    Visit Number 4    Number of Visits 19    Date for PT Re-Evaluation 07/07/20    PT Start Time 1106    PT Stop Time 1205    PT Time Calculation (min) 59 min    Activity Tolerance Patient tolerated treatment well    Behavior During Therapy [facility] for tasks assessed/performed           Past Medical History:  Diagnosis Date  . Bronchitis    completed antibiotics 10/21/13/ states resolved  . Cancer ([place]) 1998, 2015   Left Breast  . Chronic diastolic (congestive) heart failure ([place])   . Hypertension   . Lymph edema    lt arm  . Stroke [facility]) 2004   TIA-  no problems since    Past Surgical History:  Procedure Laterality Date  . ABDOMINOPLASTY/PANNICULECTOMY    . BREAST IMPLANT EXCHANGE Left 09/03/2014   Procedure: LEFT REMOVAL TISSUE EXPANDERS WITH PLACEMENT OF SILICONE IMPLANT ;  Surgeon: Irene Limbo, MD;  Location: [place];  Service: Plastics;  Laterality: Left;  . BREAST RECONSTRUCTION Left 03/07/2015   Procedure: LEFT NIPPLE AEROLA CREATION WITH LOCAL FLAP/FULL THICKNESS SKIN GRAFT FROM GROIN;  Surgeon: Irene Limbo, MD;  Location: [place];  Service: Plastics;  Laterality: Left;  . BREAST SURGERY  1998   lumpectomy on left and removed lymphnodes in [place]  . HYDRADENITIS EXCISION Left 05/30/2018   Procedure: Adjacent tissue transfer left axilla;  Surgeon: Irene Limbo, MD;  Location: [place];  Service: Plastics;  Laterality: Left;  . LATISSIMUS FLAP TO BREAST Left 04/12/2014   Procedure: LEFT LATISSIMUS DORSI FLAP FOR LEFT BREAST RECONSTRUCTION AND  PLACEMENT OF TISSUE EXPANDER;  Surgeon: Irene Limbo, MD;  Location: [place];  Service: Plastics;  Laterality: Left;  . LIPOSUCTION WITH LIPOFILLING Left 09/03/2014   Procedure:  LIPOFILLING TO LEFT CHEST  ;  Surgeon: Irene Limbo, MD;  Location: [place];  Service: Plastics;  Laterality: Left;  . LIPOSUCTION WITH LIPOFILLING Left 03/07/2015   Procedure: LIPOFILLING TO LEFT BREAST;  Surgeon: Irene Limbo, MD;  Location: [place];  Service: Plastics;  Laterality: Left;  Marland Kitchen MASTECTOMY Left 2015  . MASTOPEXY Right 09/03/2014   Procedure: RIGHT BREAST MASTOPEXY ;  Surgeon: Irene Limbo, MD;  Location: [place];  Service: Plastics;  Laterality: Right;  . PORT-A-CATH REMOVAL Right 12/24/2014   Procedure: REMOVAL PORT-A-CATH;  Surgeon: Donnie Mesa, MD;  Location: [place];  Service: General;  Laterality: Right;  . PORTACATH PLACEMENT Right 10/25/2013   Procedure: INSERTION PORT-A-CATH;  Surgeon: Imogene Burn. Georgette Dover, MD;  [facility];  Service: General;  Laterality: Right;  . REDUCTION MAMMAPLASTY Right   . SIMPLE MASTECTOMY WITH AXILLARY SENTINEL NODE BIOPSY Left 04/12/2014   Procedure: MASTECTOMY;  Surgeon: Imogene Burn. Georgette Dover, MD;  Location: [place];  Service: General;  Laterality: Left;  . TISSUE EXPANDER PLACEMENT Left 04/12/2014   Procedure: TISSUE EXPANDER;  Surgeon: Irene Limbo, MD;  Location: [place];  Service: Plastics;  Laterality: Left;  . TISSUE EXPANDER PLACEMENT Left 05/22/2014   Procedure: PLACEMENT OF TISSUE EXPANDER/I&D  WASHOUT SEROMA;  Surgeon: Irene Limbo, MD;  Location: [place];  Service: Plastics;  Laterality: Left;    There were no vitals filed for this visit.   Subjective Assessment - 05/30/20 1111    Subjective She measured me after removing the first bandages you put on and my arm had already reduced alot. I am happy with progress so far.    Pertinent History mini stroke 2004, hx of L breast  cancer with lumpectomy in 1998 and ALND- pt completed chemo and radiation in 1998, then she had a new cancer in her left breast in 2015 and underwent a mastectomy with lat flap reconstruction and has completed chemotherapy    Patient Stated Goals to get the swelling down    Currently in Pain? No/denies                       Outpatient [facility]05/[facility]  Lymphedema Life Impact Scale Total Score 61.76 %            [facility] Adult PT Treatment/Exercise - 05/30/20 0001      Manual Therapy   Manual Lymphatic Drainage (MLD) In Supine: Short neck, superficial and deep abdominals, Lt inguinal and Rt axillary nodes, Lt axillo-inguinal and anterior inter-axillary anastomsosis, then Lt UE: lateral upper arm, medial to lateral, and lateral upper arm again, ant/post foreram , and dorsal hand and fingers retracing all steps directing fluid towards pathways    Compression Bandaging Coconut oil applied to Lt UE then thin stockinette, Elastomull to all 5 fingers using 1.5 rolls and adding 1/2" gray compression foam to dorsum of hand, Artiflex x1 adding 1/4" gray compression foam to ant/post forearm, then 1-6 cm, 1-10 cm and 2-12 cm (first done in herring bone fashion) from hand to axilla                       PT Long Term Goals - 05/05/20 0849      PT LONG TERM GOAL #1   Title Pt will demonstrate a 6 cm reduction in swelling at wrist to decrease risk of cellulitis.    Baseline 27    Time 6    Period Weeks    Status New    Target Date 07/07/20      PT LONG TERM GOAL #2   Title Pt will demonstrate a 7 cm reduction in edema 15 cm proximal to ulnar styloid process to decresae risk of lymphedema.    Baseline 37.8    Time 6    Period Weeks    Status New    Target Date 07/07/20      PT LONG TERM GOAL #3   Title Pt will obtain appropriate compression garments for long term management of lymphedema.    Time 6    Period Weeks     Status New    Target Date 07/07/20      PT LONG TERM GOAL #4   Title Pt and/or spouse will be independent in long term managment of lymphedema to decrease risk of cellulitis.    Time 6    Period Weeks    Status New    Target Date 07/07/20                 Plan - 05/30/20 1212    Clinical Impression Statement Pt is doing well with being able to keep bandages on between appointments. She continues to report noticing her  arm reducing and is happy with progress. Also tissue palpably softer througout UE. Continued with complete decongestive therapy. Added gray compression foam to dorsum of hand and ant/post forearm to promote further reduction of fluid and to help fixate bandages in place better going into weekend. Pt reports bandages comfortable upon leaving.    Personal Factors and Comorbidities Time since onset of injury/illness/exacerbation    Stability/Clinical Decision Making Stable/Uncomplicated    Rehab Potential Good    Clinical Impairments Affecting Rehab Potential hx of radiation, long standing hx of lymphedema    PT Frequency 3x / week    PT Duration 6 weeks    PT Treatment/Interventions ADLs/Self Care Home Management;Therapeutic exercise;Therapeutic activities;Patient/family education;Manual techniques;Manual lymph drainage;Compression bandaging;Taping;Vasopneumatic Device;Passive range of motion;Scar mobilization    PT Next Visit Plan Cont complete decongestive therapy, how was compression foam? eventually talk about compression garments, see if pt has been using pump daily. Instruct wife further in bandaging if she wants to learn and issue handout for this.    PT Home Exercise Plan Perform remedial exercises while wearing bandages    Consulted and Agree with Plan of Care Patient;Family member/caregiver    Family Member Consulted spouse           Patient will benefit from skilled therapeutic intervention in order to improve the following deficits and impairments:   Pain, Postural dysfunction, Increased edema, Decreased knowledge of precautions  Visit Diagnosis: Postmastectomy lymphedema  Abnormal posture     Problem List Patient Active Problem List   Diagnosis Date Noted  . Bunion 10/11/2018  . Essential hypertension 10/09/2018  . Depression   . Hypokalemia 04/06/2016  . Sepsis ([place]) 04/06/2016  . Cellulitis of left hand 04/06/2016  . Lymph edema   . Chronic diastolic (congestive) heart failure ([place])   . History of therapeutic radiation 01/14/2015  . Recurrent breast cancer ([place]) 04/12/2014  . Hepatic steatosis 02/01/2014  . Malignant neoplasm of upper-outer quadrant of left breast in female, estrogen receptor positive ([place]) 10/12/2013  . Stroke [facility]) 09/13/2002    Otelia Limes, PTA 05/30/2020, 12:22 PM  [place], [place], [address] Phone: [phone]   Fax:  [phone]  Name: Kelsey Mclaughlin MRN: [id number] Date of Birth: October 17, 1960

## 2020-06-02 ENCOUNTER — Other Ambulatory Visit: Payer: Self-pay

## 2020-06-02 ENCOUNTER — Encounter: Payer: Self-pay | Admitting: Physical Therapy

## 2020-06-02 ENCOUNTER — Ambulatory Visit: Payer: Federal, State, Local not specified - PPO | Admitting: Physical Therapy

## 2020-06-02 DIAGNOSIS — R293 Abnormal posture: Secondary | ICD-10-CM | POA: Diagnosis not present

## 2020-06-02 DIAGNOSIS — I972 Postmastectomy lymphedema syndrome: Secondary | ICD-10-CM | POA: Diagnosis not present

## 2020-06-02 NOTE — Therapy (Signed)
[place], [place], [address] Phone: [phone]   Fax:  [phone]  Physical Therapy Treatment  Patient Details  Name: Kelsey Mclaughlin MRN: [id number] Date of Birth: [birth year] Referring Provider (PT): Magrinat   Encounter Date: 06/02/2020   PT End of Session - 06/02/20 1309    Visit Number 5    Number of Visits 19    Date for PT Re-Evaluation 07/07/20    PT Start Time 1101    PT Stop Time 1159    PT Time Calculation (min) 58 min    Activity Tolerance Patient tolerated treatment well    Behavior During Therapy [facility] for tasks assessed/performed           Past Medical History:  Diagnosis Date  . Bronchitis    completed antibiotics 10/21/13/ states resolved  . Cancer ([place]) 1998, 2015   Left Breast  . Chronic diastolic (congestive) heart failure ([place])   . Hypertension   . Lymph edema    lt arm  . Stroke [facility]) 2004   TIA-  no problems since    Past Surgical History:  Procedure Laterality Date  . ABDOMINOPLASTY/PANNICULECTOMY    . BREAST IMPLANT EXCHANGE Left 09/03/2014   Procedure: LEFT REMOVAL TISSUE EXPANDERS WITH PLACEMENT OF SILICONE IMPLANT ;  Surgeon: Irene Limbo, MD;  Location: [place];  Service: Plastics;  Laterality: Left;  . BREAST RECONSTRUCTION Left 03/07/2015   Procedure: LEFT NIPPLE AEROLA CREATION WITH LOCAL FLAP/FULL THICKNESS SKIN GRAFT FROM GROIN;  Surgeon: Irene Limbo, MD;  Location: [place];  Service: Plastics;  Laterality: Left;  . BREAST SURGERY  1998   lumpectomy on left and removed lymphnodes in [place]  . HYDRADENITIS EXCISION Left 05/30/2018   Procedure: Adjacent tissue transfer left axilla;  Surgeon: Irene Limbo, MD;  Location: [place];  Service: Plastics;  Laterality: Left;  . LATISSIMUS FLAP TO BREAST Left 04/12/2014   Procedure: LEFT LATISSIMUS DORSI FLAP FOR LEFT BREAST RECONSTRUCTION AND  PLACEMENT OF TISSUE EXPANDER;  Surgeon: Irene Limbo, MD;  Location: [place];  Service: Plastics;  Laterality: Left;  . LIPOSUCTION WITH LIPOFILLING Left 09/03/2014   Procedure:  LIPOFILLING TO LEFT CHEST  ;  Surgeon: Irene Limbo, MD;  Location: [place];  Service: Plastics;  Laterality: Left;  . LIPOSUCTION WITH LIPOFILLING Left 03/07/2015   Procedure: LIPOFILLING TO LEFT BREAST;  Surgeon: Irene Limbo, MD;  Location: [place];  Service: Plastics;  Laterality: Left;  Marland Kitchen MASTECTOMY Left 2015  . MASTOPEXY Right 09/03/2014   Procedure: RIGHT BREAST MASTOPEXY ;  Surgeon: Irene Limbo, MD;  Location: [place];  Service: Plastics;  Laterality: Right;  . PORT-A-CATH REMOVAL Right 12/24/2014   Procedure: REMOVAL PORT-A-CATH;  Surgeon: Donnie Mesa, MD;  Location: [place];  Service: General;  Laterality: Right;  . PORTACATH PLACEMENT Right 10/25/2013   Procedure: INSERTION PORT-A-CATH;  Surgeon: Imogene Burn. Georgette Dover, MD;  Location: [facility];  Service: General;  Laterality: Right;  . REDUCTION MAMMAPLASTY Right   . SIMPLE MASTECTOMY WITH AXILLARY SENTINEL NODE BIOPSY Left 04/12/2014   Procedure: MASTECTOMY;  Surgeon: Imogene Burn. Georgette Dover, MD;  Location: [place];  Service: General;  Laterality: Left;  . TISSUE EXPANDER PLACEMENT Left 04/12/2014   Procedure: TISSUE EXPANDER;  Surgeon: Irene Limbo, MD;  Location: [place];  Service: Plastics;  Laterality: Left;  . TISSUE EXPANDER PLACEMENT Left 05/22/2014   Procedure: PLACEMENT OF TISSUE EXPANDER/I&D  WASHOUT SEROMA;  Surgeon: Irene Limbo, MD;  Location: [place];  Service: Plastics;  Laterality: Left;    There were no vitals filed for this visit.   Subjective Assessment - 06/02/20 1101    Subjective Had to remove bandages yesterday evening due to the irritation at the anterior elbow. Hand came loose on its own.    Pertinent History mini stroke 2004, hx of L breast cancer with  lumpectomy in 1998 and ALND- pt completed chemo and radiation in 1998, then she had a new cancer in her left breast in 2015 and underwent a mastectomy with lat flap reconstruction and has completed chemotherapy    Patient Stated Goals to get the swelling down    Currently in Pain? No/denies    Pain Score 0-No pain                       Outpatient Re[facility]  Lymphedema Life Impact Scale Total Score 61.76 %            [facility] Adult PT Treatment/Exercise - 06/02/20 0001      Manual Therapy   Manual Lymphatic Drainage (MLD) In Supine: Short neck, superficial and deep abdominals, Lt inguinal and Rt axillary nodes, Lt axillo-inguinal and anterior inter-axillary anastomsosis, then Lt UE: lateral upper arm, medial to lateral, and lateral upper arm again, ant/post foreram , and dorsal hand and fingers retracing all steps directing fluid towards pathways    Compression Bandaging Coconut oil applied to Lt UE then thick stockinette, Elastomull to all 5 fingers using 1.5 rolls and adding 1/2" gray compression foam to dorsum of hand, Artiflex x1 adding 1/4" gray compression foam to ant/post forearm, then 1-6 cm, 1-10 cm and 2-12 cm (first done in herring bone fashion) from hand to axilla                       PT Long Term Goals - 05/05/20 0849      PT LONG TERM GOAL #1   Title Pt will demonstrate a 6 cm reduction in swelling at wrist to decrease risk of cellulitis.    Baseline 27    Time 6    Period Weeks    Status New    Target Date 07/07/20      PT LONG TERM GOAL #2   Title Pt will demonstrate a 7 cm reduction in edema 15 cm proximal to ulnar styloid process to decresae risk of lymphedema.    Baseline 37.8    Time 6    Period Weeks    Status New    Target Date 07/07/20      PT LONG TERM GOAL #3   Title Pt will obtain appropriate compression garments for long term management of lymphedema.    Time 6     Period Weeks    Status New    Target Date 07/07/20      PT LONG TERM GOAL #4   Title Pt and/or spouse will be independent in long term managment of lymphedema to decrease risk of cellulitis.    Time 6    Period Weeks    Status New    Target Date 07/07/20                 Plan - 06/02/20 1309    Clinical Impression Statement Pt was having increased itchiness at antecubital fossa. Used thick stockinette today with foam instead of TG  soft to see if this would help. Her forearm seems more pliable with less fibrosis. Continued with MLD and compression bandaging.    Rehab Potential Good    Clinical Impairments Affecting Rehab Potential hx of radiation, long standing hx of lymphedema    PT Frequency 3x / week    PT Duration 6 weeks    PT Treatment/Interventions ADLs/Self Care Home Management;Therapeutic exercise;Therapeutic activities;Patient/family education;Manual techniques;Manual lymph drainage;Compression bandaging;Taping;Vasopneumatic Device;Passive range of motion;Scar mobilization    PT Next Visit Plan Cont complete decongestive therapy, how was irritation? improved?  eventually talk about compression garments, see if pt has been using pump daily. Instruct wife further in bandaging if she wants to learn and issue handout for this.    PT Home Exercise Plan Perform remedial exercises while wearing bandages    Consulted and Agree with Plan of Care Patient;Family member/caregiver           Patient will benefit from skilled therapeutic intervention in order to improve the following deficits and impairments:  Pain, Postural dysfunction, Increased edema, Decreased knowledge of precautions  Visit Diagnosis: Postmastectomy lymphedema     Problem List Patient Active Problem List   Diagnosis Date Noted  . Bunion 10/11/2018  . Essential hypertension 10/09/2018  . Depression   . Hypokalemia 04/06/2016  . Sepsis ([place]) 04/06/2016  . Cellulitis of left hand 04/06/2016  . Lymph  edema   . Chronic diastolic (congestive) heart failure ([place])   . History of therapeutic radiation 01/14/2015  . Recurrent breast cancer ([place]) 04/12/2014  . Hepatic steatosis 02/01/2014  . Malignant neoplasm of upper-outer quadrant of left breast in female, estrogen receptor positive ([place]) 10/12/2013  . Stroke [facility]) 09/13/2002    Allyson Sabal [facility] 06/02/2020, 1:11 PM  [place], [place], [address] Phone: [phone]   Fax:  [phone]  Name: Kelsey Mclaughlin MRN: [id number] Date of Birth: [birth year]  Manus Gunning, PT 06/02/20 1:12 PM

## 2020-06-04 ENCOUNTER — Ambulatory Visit: Payer: Federal, State, Local not specified - PPO

## 2020-06-04 ENCOUNTER — Other Ambulatory Visit: Payer: Self-pay

## 2020-06-04 DIAGNOSIS — I972 Postmastectomy lymphedema syndrome: Secondary | ICD-10-CM | POA: Diagnosis not present

## 2020-06-04 DIAGNOSIS — R293 Abnormal posture: Secondary | ICD-10-CM | POA: Diagnosis not present

## 2020-06-04 NOTE — Therapy (Signed)
[place], [place], [address] Phone: [phone]   Fax:  [phone]  Physical Therapy Treatment  Patient Details  Name: Kelsey Mclaughlin MRN: [id number] Date of Birth: Nov 11, 1960 Referring Provider (PT): Magrinat   Encounter Date: 06/04/2020   PT End of Session - 06/04/20 1306    Visit Number 6    Number of Visits 19    Date for PT Re-Evaluation 07/07/20    PT Start Time 1205    PT Stop Time 1300    PT Time Calculation (min) 55 min    Activity Tolerance Patient tolerated treatment well    Behavior During Therapy [facility] for tasks assessed/performed           Past Medical History:  Diagnosis Date  . Bronchitis    completed antibiotics 10/21/13/ states resolved  . Cancer ([place]) 1998, 2015   Left Breast  . Chronic diastolic (congestive) heart failure ([place])   . Hypertension   . Lymph edema    lt arm  . Stroke [facility]) 2004   TIA-  no problems since    Past Surgical History:  Procedure Laterality Date  . ABDOMINOPLASTY/PANNICULECTOMY    . BREAST IMPLANT EXCHANGE Left 09/03/2014   Procedure: LEFT REMOVAL TISSUE EXPANDERS WITH PLACEMENT OF SILICONE IMPLANT ;  Surgeon: Irene Limbo, MD;  Location: [place];  Service: Plastics;  Laterality: Left;  . BREAST RECONSTRUCTION Left 03/07/2015   Procedure: LEFT NIPPLE AEROLA CREATION WITH LOCAL FLAP/FULL THICKNESS SKIN GRAFT FROM GROIN;  Surgeon: Irene Limbo, MD;  Location: [place];  Service: Plastics;  Laterality: Left;  . BREAST SURGERY  1998   lumpectomy on left and removed lymphnodes in [place]  . HYDRADENITIS EXCISION Left 05/30/2018   Procedure: Adjacent tissue transfer left axilla;  Surgeon: Irene Limbo, MD;  Location: [place];  Service: Plastics;  Laterality: Left;  . LATISSIMUS FLAP TO BREAST Left 04/12/2014   Procedure: LEFT LATISSIMUS DORSI FLAP FOR LEFT BREAST RECONSTRUCTION AND  PLACEMENT OF TISSUE EXPANDER;  Surgeon: Irene Limbo, MD;  Location: [place];  Service: Plastics;  Laterality: Left;  . LIPOSUCTION WITH LIPOFILLING Left 09/03/2014   Procedure:  LIPOFILLING TO LEFT CHEST  ;  Surgeon: Irene Limbo, MD;  Location: [place];  Service: Plastics;  Laterality: Left;  . LIPOSUCTION WITH LIPOFILLING Left 03/07/2015   Procedure: LIPOFILLING TO LEFT BREAST;  Surgeon: Irene Limbo, MD;  Location: [place];  Service: Plastics;  Laterality: Left;  Marland Kitchen MASTECTOMY Left 2015  . MASTOPEXY Right 09/03/2014   Procedure: RIGHT BREAST MASTOPEXY ;  Surgeon: Irene Limbo, MD;  Location: [place];  Service: Plastics;  Laterality: Right;  . PORT-A-CATH REMOVAL Right 12/24/2014   Procedure: REMOVAL PORT-A-CATH;  Surgeon: Donnie Mesa, MD;  Location: [place];  Service: General;  Laterality: Right;  . PORTACATH PLACEMENT Right 10/25/2013   Procedure: INSERTION PORT-A-CATH;  Surgeon: Imogene Burn. Georgette Dover, MD;  [facility];  Service: General;  Laterality: Right;  . REDUCTION MAMMAPLASTY Right   . SIMPLE MASTECTOMY WITH AXILLARY SENTINEL NODE BIOPSY Left 04/12/2014   Procedure: MASTECTOMY;  Surgeon: Imogene Burn. Georgette Dover, MD;  Location: [place];  Service: General;  Laterality: Left;  . TISSUE EXPANDER PLACEMENT Left 04/12/2014   Procedure: TISSUE EXPANDER;  Surgeon: Irene Limbo, MD;  Location: [place];  Service: Plastics;  Laterality: Left;  . TISSUE EXPANDER PLACEMENT Left 05/22/2014   Procedure: PLACEMENT OF TISSUE EXPANDER/I&D  WASHOUT SEROMA;  Surgeon: Irene Limbo, MD;  Location: [place];  Service: Plastics;  Laterality: Left;    There were no vitals filed for this visit.   Subjective Assessment - 06/04/20 1208    Subjective I'm very happy with my arm progress! The skin is feeling softer.    Pertinent History mini stroke 2004, hx of L breast cancer with lumpectomy in 1998 and ALND- pt completed chemo and  radiation in 1998, then she had a new cancer in her left breast in 2015 and underwent a mastectomy with lat flap reconstruction and has completed chemotherapy    Patient Stated Goals to get the swelling down    Currently in Pain? No/denies                 LYMPHEDEMA/ONCOLOGY QUESTIONNAIRE - 06/04/20 0001      Left Upper Extremity Lymphedema   15 cm Proximal to Olecranon Process 32.4 cm    10 cm Proximal to Olecranon Process 31.9 cm    Olecranon Process 31.1 cm    15 cm Proximal to Ulnar Styloid Process 32.1 cm    10 cm Proximal to Ulnar Styloid Process 30.8 cm    Just Proximal to Ulnar Styloid Process 25.4 cm    Across Hand at [employer] 24.8 cm    At [place] of 2nd Digit 8.1 cm                [facility]05/[facility]  Lymphedema Life Impact Scale Total Score 61.76 %            [facility] Adult PT Treatment/Exercise - 06/04/20 0001      Manual Therapy   Manual Lymphatic Drainage (MLD) In Supine: Short neck, superficial and deep abdominals, Lt inguinal and Rt axillary nodes, Lt axillo-inguinal and anterior inter-axillary anastomsosis, then Lt UE: lateral upper arm, medial to lateral, and lateral upper arm again, ant/post foreram , and dorsal hand and fingers retracing all steps directing fluid towards pathways    Compression Bandaging Coconut oil applied to Lt UE then thick stockinette, Elastomull to all 5 fingers using 1.5 rolls and adding 1/2" gray compression foam to dorsum of hand, Artiflex x1 adding 1/4" gray compression foam to ant/post forearm, then 1-6 cm, 1-10 cm and 2-12 cm (first done in herring bone fashion) from hand to axilla                       PT Long Term Goals - 05/05/20 0849      PT LONG TERM GOAL #1   Title Pt will demonstrate a 6 cm reduction in swelling at wrist to decrease risk of cellulitis.    Baseline 27    Time 6    Period Weeks    Status New    Target Date 07/07/20       PT LONG TERM GOAL #2   Title Pt will demonstrate a 7 cm reduction in edema 15 cm proximal to ulnar styloid process to decresae risk of lymphedema.    Baseline 37.8    Time 6    Period Weeks    Status New    Target Date 07/07/20      PT LONG TERM GOAL #3   Title Pt will obtain appropriate compression garments for long term management of lymphedema.    Time 6    Period Weeks    Status New    Target Date 07/07/20  PT LONG TERM GOAL #4   Title Pt and/or spouse will be independent in long term managment of lymphedema to decrease risk of cellulitis.    Time 6    Period Weeks    Status New    Target Date 07/07/20                 Plan - 06/04/20 1326    Clinical Impression Statement Pt tolerating bandaging well along with TG soft. She continues to have good reductions of circumference and will benefit from continuing complete decongestive therapy to Lt UE. Issued handout to wife for compression bandaging and began instructing while applying.    Personal Factors and Comorbidities Time since onset of injury/illness/exacerbation    Stability/Clinical Decision Making Stable/Uncomplicated    Rehab Potential Good    Clinical Impairments Affecting Rehab Potential hx of radiation, long standing hx of lymphedema    PT Frequency 3x / week    PT Duration 6 weeks    PT Treatment/Interventions ADLs/Self Care Home Management;Therapeutic exercise;Therapeutic activities;Patient/family education;Manual techniques;Manual lymph drainage;Compression bandaging;Taping;Vasopneumatic Device;Passive range of motion;Scar mobilization    PT Next Visit Plan Cont complete decongestive therapy,  eventually talk about compression garments, see if pt has been using pump daily. Instruct wife further in bandaging if she wants to learn.    PT Home Exercise Plan Perform remedial exercises while wearing bandages    Consulted and Agree with Plan of Care Patient;Family member/caregiver    Family Member  Consulted spouse           Patient will benefit from skilled therapeutic intervention in order to improve the following deficits and impairments:  Pain, Postural dysfunction, Increased edema, Decreased knowledge of precautions  Visit Diagnosis: Postmastectomy lymphedema  Abnormal posture     Problem List Patient Active Problem List   Diagnosis Date Noted  . Bunion 10/11/2018  . Essential hypertension 10/09/2018  . Depression   . Hypokalemia 04/06/2016  . Sepsis ([place]) 04/06/2016  . Cellulitis of left hand 04/06/2016  . Lymph edema   . Chronic diastolic (congestive) heart failure ([place])   . History of therapeutic radiation 01/14/2015  . Recurrent breast cancer ([place]) 04/12/2014  . Hepatic steatosis 02/01/2014  . Malignant neoplasm of upper-outer quadrant of left breast in female, estrogen receptor positive ([place]) 10/12/2013  . Stroke [facility]) 09/13/2002    Otelia Limes, PTA 06/04/2020, 1:31 PM  [place], [place], [address] Phone: [phone]   Fax:  [phone]  Name: Kelsey Mclaughlin MRN: [id number] Date of Birth: 04/23/1961

## 2020-06-06 ENCOUNTER — Ambulatory Visit: Payer: Federal, State, Local not specified - PPO

## 2020-06-06 ENCOUNTER — Other Ambulatory Visit: Payer: Self-pay

## 2020-06-06 DIAGNOSIS — I972 Postmastectomy lymphedema syndrome: Secondary | ICD-10-CM

## 2020-06-06 DIAGNOSIS — R293 Abnormal posture: Secondary | ICD-10-CM

## 2020-06-06 NOTE — Therapy (Signed)
[place], [place], [address] Phone: [phone]   Fax:  [phone]  Physical Therapy Treatment  Patient Details  Name: Kelsey Mclaughlin MRN: [id number] Date of Birth: 05-09-1961 Referring Provider (PT): Magrinat   Encounter Date: 06/06/2020   PT End of Session - 06/06/20 1208    Visit Number 7    Number of Visits 19    Date for PT Re-Evaluation 07/07/20    PT Start Time 6967    PT Stop Time 1202    PT Time Calculation (min) 57 min    Activity Tolerance Patient tolerated treatment well    Behavior During Therapy [facility] for tasks assessed/performed           Past Medical History:  Diagnosis Date  . Bronchitis    completed antibiotics 10/21/13/ states resolved  . Cancer ([place]) 1998, 2015   Left Breast  . Chronic diastolic (congestive) heart failure ([place])   . Hypertension   . Lymph edema    lt arm  . Stroke [facility]) 2004   TIA-  no problems since    Past Surgical History:  Procedure Laterality Date  . ABDOMINOPLASTY/PANNICULECTOMY    . BREAST IMPLANT EXCHANGE Left 09/03/2014   Procedure: LEFT REMOVAL TISSUE EXPANDERS WITH PLACEMENT OF SILICONE IMPLANT ;  Surgeon: Irene Limbo, MD;  Location: [place];  Service: Plastics;  Laterality: Left;  . BREAST RECONSTRUCTION Left 03/07/2015   Procedure: LEFT NIPPLE AEROLA CREATION WITH LOCAL FLAP/FULL THICKNESS SKIN GRAFT FROM GROIN;  Surgeon: Irene Limbo, MD;  Location: [place];  Service: Plastics;  Laterality: Left;  . BREAST SURGERY  1998   lumpectomy on left and removed lymphnodes in [place]  . HYDRADENITIS EXCISION Left 05/30/2018   Procedure: Adjacent tissue transfer left axilla;  Surgeon: Irene Limbo, MD;  Location: [place];  Service: Plastics;  Laterality: Left;  . LATISSIMUS FLAP TO BREAST Left 04/12/2014   Procedure: LEFT LATISSIMUS DORSI FLAP FOR LEFT BREAST RECONSTRUCTION AND  PLACEMENT OF TISSUE EXPANDER;  Surgeon: Irene Limbo, MD;  Location: [place];  Service: Plastics;  Laterality: Left;  . LIPOSUCTION WITH LIPOFILLING Left 09/03/2014   Procedure:  LIPOFILLING TO LEFT CHEST  ;  Surgeon: Irene Limbo, MD;  Location: [place];  Service: Plastics;  Laterality: Left;  . LIPOSUCTION WITH LIPOFILLING Left 03/07/2015   Procedure: LIPOFILLING TO LEFT BREAST;  Surgeon: Irene Limbo, MD;  Location: [place];  Service: Plastics;  Laterality: Left;  Marland Kitchen MASTECTOMY Left 2015  . MASTOPEXY Right 09/03/2014   Procedure: RIGHT BREAST MASTOPEXY ;  Surgeon: Irene Limbo, MD;  Location: [place];  Service: Plastics;  Laterality: Right;  . PORT-A-CATH REMOVAL Right 12/24/2014   Procedure: REMOVAL PORT-A-CATH;  Surgeon: Donnie Mesa, MD;  Location: [place];  Service: General;  Laterality: Right;  . PORTACATH PLACEMENT Right 10/25/2013   Procedure: INSERTION PORT-A-CATH;  Surgeon: Imogene Burn. Georgette Dover, MD;  [facility]: [facility];  Service: General;  Laterality: Right;  . REDUCTION MAMMAPLASTY Right   . SIMPLE MASTECTOMY WITH AXILLARY SENTINEL NODE BIOPSY Left 04/12/2014   Procedure: MASTECTOMY;  Surgeon: Imogene Burn. Georgette Dover, MD;  Location: [place];  Service: General;  Laterality: Left;  . TISSUE EXPANDER PLACEMENT Left 04/12/2014   Procedure: TISSUE EXPANDER;  Surgeon: Irene Limbo, MD;  Location: [place];  Service: Plastics;  Laterality: Left;  . TISSUE EXPANDER PLACEMENT Left 05/22/2014   Procedure: PLACEMENT OF TISSUE EXPANDER/I&D  WASHOUT SEROMA;  Surgeon: Irene Limbo, MD;  Location: [place];  Service: Plastics;  Laterality: Left;    There were no vitals filed for this visit.   Subjective Assessment - 06/06/20 1108    Subjective Nothing new from last visit. My arm and skin is doing well with bandaging.    Pertinent History mini stroke 2004, hx of L breast cancer with lumpectomy in 1998 and ALND- pt completed chemo  and radiation in 1998, then she had a new cancer in her left breast in 2015 and underwent a mastectomy with lat flap reconstruction and has completed chemotherapy    Patient Stated Goals to get the swelling down    Currently in Pain? No/denies                       Outpatient [facility]  Lymphedema Life Impact Scale Total Score 61.76 %            [facility] Adult PT Treatment/Exercise - 06/06/20 0001      Manual Therapy   Manual Lymphatic Drainage (MLD) In Supine: Short neck, superficial and deep abdominals, Lt inguinal and Rt axillary nodes, Lt axillo-inguinal and anterior inter-axillary anastomsosis, then Lt UE: lateral upper arm, medial to lateral, and lateral upper arm again, ant/post foreram , and dorsal hand and fingers retracing all steps directing fluid towards pathways    Compression Bandaging Coconut oil applied to Lt UE then thick stockinette, Elastomull to all 5 fingers using 1.5 rolls and adding 1/2" gray compression foam to dorsum of hand, Artiflex x1 adding 1/4" gray compression foam to ant/post forearm, then 1-6 cm, 1-10 cm and 2-12 cm (first done in herring bone fashion) from hand to axilla                       PT Long Term Goals - 05/05/20 0849      PT LONG TERM GOAL #1   Title Pt will demonstrate a 6 cm reduction in swelling at wrist to decrease risk of cellulitis.    Baseline 27    Time 6    Period Weeks    Status New    Target Date 07/07/20      PT LONG TERM GOAL #2   Title Pt will demonstrate a 7 cm reduction in edema 15 cm proximal to ulnar styloid process to decresae risk of lymphedema.    Baseline 37.8    Time 6    Period Weeks    Status New    Target Date 07/07/20      PT LONG TERM GOAL #3   Title Pt will obtain appropriate compression garments for long term management of lymphedema.    Time 6    Period Weeks    Status New    Target Date 07/07/20      PT LONG TERM  GOAL #4   Title Pt and/or spouse will be independent in long term managment of lymphedema to decrease risk of cellulitis.    Time 6    Period Weeks    Status New    Target Date 07/07/20                 Plan - 06/06/20 1208    Clinical Impression Statement Continued with complete decongestive therapy of Lt UE as she is tolerating this well and continuing to show reduction. Remeasured circumference at last session so did not need to remeasure  today, however tissue continues to feel softer. Also at last session pt had increased mottling of forearm and hand noted when bandages removed, but this was much improved from last session as well.    Personal Factors and Comorbidities Time since onset of injury/illness/exacerbation    Stability/Clinical Decision Making Stable/Uncomplicated    Rehab Potential Good    Clinical Impairments Affecting Rehab Potential hx of radiation, long standing hx of lymphedema    PT Frequency 3x / week    PT Duration 6 weeks    PT Treatment/Interventions ADLs/Self Care Home Management;Therapeutic exercise;Therapeutic activities;Patient/family education;Manual techniques;Manual lymph drainage;Compression bandaging;Taping;Vasopneumatic Device;Passive range of motion;Scar mobilization    PT Next Visit Plan Cont complete decongestive therapy,  eventually talk about compression garments, see if pt has been using pump daily. Instruct wife further in bandaging if she wants to learn.    PT Home Exercise Plan Perform remedial exercises while wearing bandages    Consulted and Agree with Plan of Care Patient;Family member/caregiver    Family Member Consulted spouse           Patient will benefit from skilled therapeutic intervention in order to improve the following deficits and impairments:  Pain, Postural dysfunction, Increased edema, Decreased knowledge of precautions  Visit Diagnosis: Postmastectomy lymphedema  Abnormal posture     Problem List Patient Active  Problem List   Diagnosis Date Noted  . Bunion 10/11/2018  . Essential hypertension 10/09/2018  . Depression   . Hypokalemia 04/06/2016  . Sepsis ([place]) 04/06/2016  . Cellulitis of left hand 04/06/2016  . Lymph edema   . Chronic diastolic (congestive) heart failure ([place])   . History of therapeutic radiation 01/14/2015  . Recurrent breast cancer ([place]) 04/12/2014  . Hepatic steatosis 02/01/2014  . Malignant neoplasm of upper-outer quadrant of left breast in female, estrogen receptor positive ([place]) 10/12/2013  . Stroke [facility]) 09/13/2002    Otelia Limes, PTA 06/06/2020, 12:14 PM  [place], [place], [address] Phone: [phone]   Fax:  [phone]  Name: Letesha Klecker MRN: [id number] Date of Birth: 1961/01/20

## 2020-06-09 ENCOUNTER — Ambulatory Visit: Payer: Federal, State, Local not specified - PPO

## 2020-06-09 ENCOUNTER — Other Ambulatory Visit: Payer: Self-pay

## 2020-06-09 DIAGNOSIS — I972 Postmastectomy lymphedema syndrome: Secondary | ICD-10-CM | POA: Diagnosis not present

## 2020-06-09 DIAGNOSIS — R293 Abnormal posture: Secondary | ICD-10-CM | POA: Diagnosis not present

## 2020-06-09 NOTE — Therapy (Signed)
[place]el[place]cl[place], [place], [address] Phone: [phone]   Fax:  [phone]  Physical Therapy Treatment  Patient Details  Name: Kelsey Mclaughlin MRN: [id number] Date of Birth: 1961-01-02 Referring Provider (PT): Magrinat   Encounter Date: 06/09/2020   PT End of Session - 06/09/20 1207    Visit Number 8    Number of Visits 19    Date for PT Re-Evaluation 07/07/20    PT Start Time 1106    PT Stop Time 1204    PT Time Calculation (min) 58 min    Activity Tolerance Patient tolerated treatment well    Behavior During Therapy [facility] for tasks assessed/performed           Past Medical History:  Diagnosis Date  . Bronchitis    completed antibiotics 10/21/13/ states resolved  . Cancer ([place]) 1998, 2015   Left Breast  . Chronic diastolic (congestive) heart failure ([place])   . Hypertension   . Lymph edema    lt arm  . Stroke [facility]) 2004   TIA-  no problems since    Past Surgical History:  Procedure Laterality Date  . ABDOMINOPLASTY/PANNICULECTOMY    . BREAST IMPLANT EXCHANGE Left 09/03/2014   Procedure: LEFT REMOVAL TISSUE EXPANDERS WITH PLACEMENT OF SILICONE IMPLANT ;  Surgeon: Irene Limbo, MD;  Location: [place];  Service: Plastics;  Laterality: Left;  . BREAST RECONSTRUCTION Left 03/07/2015   Procedure: LEFT NIPPLE AEROLA CREATION WITH LOCAL FLAP/FULL THICKNESS SKIN GRAFT FROM GROIN;  Surgeon: Irene Limbo, MD;  Location: [place];  Service: Plastics;  Laterality: Left;  . BREAST SURGERY  1998   lumpectomy on left and removed lymphnodes in [place]  . HYDRADENITIS EXCISION Left 05/30/2018   Procedure: Adjacent tissue transfer left axilla;  Surgeon: Irene Limbo, MD;  Location: [place];  Service: Plastics;  Laterality: Left;  . LATISSIMUS FLAP TO BREAST Left 04/12/2014   Procedure: LEFT LATISSIMUS DORSI FLAP FOR LEFT BREAST RECONSTRUCTION AND  PLACEMENT OF TISSUE EXPANDER;  Surgeon: Irene Limbo, MD;  Location: [place];  Service: Plastics;  Laterality: Left;  . LIPOSUCTION WITH LIPOFILLING Left 09/03/2014   Procedure:  LIPOFILLING TO LEFT CHEST  ;  Surgeon: Irene Limbo, MD;  Location: [place];  Service: Plastics;  Laterality: Left;  . LIPOSUCTION WITH LIPOFILLING Left 03/07/2015   Procedure: LIPOFILLING TO LEFT BREAST;  Surgeon: Irene Limbo, MD;  Location: [place];  Service: Plastics;  Laterality: Left;  Marland Kitchen MASTECTOMY Left 2015  . MASTOPEXY Right 09/03/2014   Procedure: RIGHT BREAST MASTOPEXY ;  Surgeon: Irene Limbo, MD;  Location: [place];  Service: Plastics;  Laterality: Right;  . PORT-A-CATH REMOVAL Right 12/24/2014   Procedure: REMOVAL PORT-A-CATH;  Surgeon: Donnie Mesa, MD;  Location: [place];  Service: General;  Laterality: Right;  . PORTACATH PLACEMENT Right 10/25/2013   Procedure: INSERTION PORT-A-CATH;  Surgeon: Imogene Burn. Georgette Dover, MD;  [facility];  Service: General;  Laterality: Right;  . REDUCTION MAMMAPLASTY Right   . SIMPLE MASTECTOMY WITH AXILLARY SENTINEL NODE BIOPSY Left 04/12/2014   Procedure: MASTECTOMY;  Surgeon: Imogene Burn. Georgette Dover, MD;  Location: [place];  Service: General;  Laterality: Left;  . TISSUE EXPANDER PLACEMENT Left 04/12/2014   Procedure: TISSUE EXPANDER;  Surgeon: Irene Limbo, MD;  Location: [place];  Service: Plastics;  Laterality: Left;  . TISSUE EXPANDER PLACEMENT Left 05/22/2014   Procedure: PLACEMENT OF TISSUE EXPANDER/I&D  WASHOUT SEROMA;  Surgeon: Irene Limbo, MD;  Location: [place];  Service: Plastics;  Laterality: Left;    There were no vitals filed for this visit.   Subjective Assessment - 06/09/20 1109    Subjective Doing well. The bandages stayed on all weekend and my skin is doing well. The itchiness has gone away since using the thicker stockinette.    Pertinent History mini stroke 2004, hx of L  breast cancer with lumpectomy in 1998 and ALND- pt completed chemo and radiation in 1998, then she had a new cancer in her left breast in 2015 and underwent a mastectomy with lat flap reconstruction and has completed chemotherapy    Patient Stated Goals to get the swelling down    Currently in Pain? No/denies                       Outpatient [facility]  Lymphedema Life Impact Scale Total Score 61.76 %            [facility] Adult PT Treatment/Exercise - 06/09/20 0001      Manual Therapy   Manual Lymphatic Drainage (MLD) In Supine: Short neck, superficial and deep abdominals, Lt inguinal and Rt axillary nodes, Lt axillo-inguinal and anterior inter-axillary anastomsosis, then Lt UE: lateral upper arm, medial to lateral, and lateral upper arm again, ant/post foreram , and dorsal hand and fingers retracing all steps directing fluid towards pathways    Compression Bandaging Coconut oil applied to Lt UE then thick stockinette, Elastomull to all 5 fingers using 2 rolls and adding 1/2" gray compression foam and large kidney shaped green foam to dorsum of hand lining up with MTP's, Artiflex x1 adding 1/4" gray compression foam to ant/post forearm, then 1-6 cm, 1-10 cm and 2-12 cm (first done in herring bone fashion) from hand to axilla                       PT Long Term Goals - 05/05/20 0849      PT LONG TERM GOAL #1   Title Pt will demonstrate a 6 cm reduction in swelling at wrist to decrease risk of cellulitis.    Baseline 27    Time 6    Period Weeks    Status New    Target Date 07/07/20      PT LONG TERM GOAL #2   Title Pt will demonstrate a 7 cm reduction in edema 15 cm proximal to ulnar styloid process to decresae risk of lymphedema.    Baseline 37.8    Time 6    Period Weeks    Status New    Target Date 07/07/20      PT LONG TERM GOAL #3   Title Pt will obtain appropriate compression garments for  long term management of lymphedema.    Time 6    Period Weeks    Status New    Target Date 07/07/20      PT LONG TERM GOAL #4   Title Pt and/or spouse will be independent in long term managment of lymphedema to decrease risk of cellulitis.    Time 6    Period Weeks    Status New    Target Date 07/07/20                 Plan - 06/09/20 1207    Clinical Impression Statement Pt continues to toelrate bandaging excellent with no skin breakdown  and has been able to keep bandages on between sessions. Her previous fibrosis at forearm is much softer now with skin and tissue supple. Pt reports being very pleased with reductions gained thus far as well. Added green kidney shaped foam to dorsum of hand along with gray compression foam working to get futher lymphatic reductions.    Personal Factors and Comorbidities Time since onset of injury/illness/exacerbation    Stability/Clinical Decision Making Stable/Uncomplicated    Rehab Potential Good    Clinical Impairments Affecting Rehab Potential hx of radiation, long standing hx of lymphedema    PT Frequency 3x / week    PT Duration 6 weeks    PT Treatment/Interventions ADLs/Self Care Home Management;Therapeutic exercise;Therapeutic activities;Patient/family education;Manual techniques;Manual lymph drainage;Compression bandaging;Taping;Vasopneumatic Device;Passive range of motion;Scar mobilization    PT Next Visit Plan Remeasure circumference next; Cont complete decongestive therapy, how was new foam at hand? eventually talk about compression garments, see if pt has been using pump daily. Instruct wife further in bandaging if she wants to learn.    PT Home Exercise Plan Perform remedial exercises while wearing bandages    Consulted and Agree with Plan of Care Patient           Patient will benefit from skilled therapeutic intervention in order to improve the following deficits and impairments:  Pain, Postural dysfunction, Increased edema,  Decreased knowledge of precautions  Visit Diagnosis: Postmastectomy lymphedema  Abnormal posture     Problem List Patient Active Problem List   Diagnosis Date Noted  . Bunion 10/11/2018  . Essential hypertension 10/09/2018  . Depression   . Hypokalemia 04/06/2016  . Sepsis ([place]) 04/06/2016  . Cellulitis of left hand 04/06/2016  . Lymph edema   . Chronic diastolic (congestive) heart failure ([place])   . History of therapeutic radiation 01/14/2015  . Recurrent breast cancer ([place]) 04/12/2014  . Hepatic steatosis 02/01/2014  . Malignant neoplasm of upper-outer quadrant of left breast in female, estrogen receptor positive ([place]) 10/12/2013  . Stroke [facility]) 09/13/2002    Otelia Limes, PTA 06/09/2020, 12:11 PM  [place], [place], [address] Phone: [phone]   Fax:  [phone]  Name: Kelsey Mclaughlin MRN: [id number] Date of Birth: 07/03/1961

## 2020-06-11 ENCOUNTER — Encounter: Payer: Self-pay | Admitting: Physical Therapy

## 2020-06-11 ENCOUNTER — Other Ambulatory Visit: Payer: Self-pay

## 2020-06-11 ENCOUNTER — Ambulatory Visit: Payer: Federal, State, Local not specified - PPO | Admitting: Physical Therapy

## 2020-06-11 DIAGNOSIS — R293 Abnormal posture: Secondary | ICD-10-CM | POA: Diagnosis not present

## 2020-06-11 DIAGNOSIS — I972 Postmastectomy lymphedema syndrome: Secondary | ICD-10-CM

## 2020-06-11 DIAGNOSIS — R109 Unspecified abdominal pain: Secondary | ICD-10-CM | POA: Diagnosis not present

## 2020-06-11 DIAGNOSIS — I10 Essential (primary) hypertension: Secondary | ICD-10-CM | POA: Diagnosis not present

## 2020-06-11 NOTE — Therapy (Signed)
[place], [place], [address] Phone: [phone]   Fax:  [phone]  Physical Therapy Treatment  Patient Details  Name: Kelsey Mclaughlin MRN: [id number] Date of Birth: Jun 30, 1961 Referring Provider (PT): Magrinat   Encounter Date: 06/11/2020   PT End of Session - 06/11/20 1203    Visit Number 9    Number of Visits 19    Date for PT Re-Evaluation 07/07/20    PT Start Time 1106    PT Stop Time 1200    PT Time Calculation (min) 54 min    Activity Tolerance Patient tolerated treatment well    Behavior During Therapy [facility] for tasks assessed/performed           Past Medical History:  Diagnosis Date  . Bronchitis    completed antibiotics 10/21/13/ states resolved  . Cancer ([place]) 1998, 2015   Left Breast  . Chronic diastolic (congestive) heart failure ([place])   . Hypertension   . Lymph edema    lt arm  . Stroke [facility]) 2004   TIA-  no problems since    Past Surgical History:  Procedure Laterality Date  . ABDOMINOPLASTY/PANNICULECTOMY    . BREAST IMPLANT EXCHANGE Left 09/03/2014   Procedure: LEFT REMOVAL TISSUE EXPANDERS WITH PLACEMENT OF SILICONE IMPLANT ;  Surgeon: Irene Limbo, MD;  Location: [place];  Service: Plastics;  Laterality: Left;  . BREAST RECONSTRUCTION Left 03/07/2015   Procedure: LEFT NIPPLE AEROLA CREATION WITH LOCAL FLAP/FULL THICKNESS SKIN GRAFT FROM GROIN;  Surgeon: Irene Limbo, MD;  Location: [place];  Service: Plastics;  Laterality: Left;  . BREAST SURGERY  1998   lumpectomy on left and removed lymphnodes in [place]  . HYDRADENITIS EXCISION Left 05/30/2018   Procedure: Adjacent tissue transfer left axilla;  Surgeon: Irene Limbo, MD;  Location: [place];  Service: Plastics;  Laterality: Left;  . LATISSIMUS FLAP TO BREAST Left 04/12/2014   Procedure: LEFT LATISSIMUS DORSI FLAP FOR LEFT BREAST RECONSTRUCTION AND  PLACEMENT OF TISSUE EXPANDER;  Surgeon: Irene Limbo, MD;  Location: [place];  Service: Plastics;  Laterality: Left;  . LIPOSUCTION WITH LIPOFILLING Left 09/03/2014   Procedure:  LIPOFILLING TO LEFT CHEST  ;  Surgeon: Irene Limbo, MD;  Location: [place];  Service: Plastics;  Laterality: Left;  . LIPOSUCTION WITH LIPOFILLING Left 03/07/2015   Procedure: LIPOFILLING TO LEFT BREAST;  Surgeon: Irene Limbo, MD;  Location: [place];  Service: Plastics;  Laterality: Left;  Marland Kitchen MASTECTOMY Left 2015  . MASTOPEXY Right 09/03/2014   Procedure: RIGHT BREAST MASTOPEXY ;  Surgeon: Irene Limbo, MD;  Location: [place];  Service: Plastics;  Laterality: Right;  . PORT-A-CATH REMOVAL Right 12/24/2014   Procedure: REMOVAL PORT-A-CATH;  Surgeon: Donnie Mesa, MD;  Location: [place];  Service: General;  Laterality: Right;  . PORTACATH PLACEMENT Right 10/25/2013   Procedure: INSERTION PORT-A-CATH;  Surgeon: Imogene Burn. Georgette Dover, MD;  [facility]: [facility];  Service: General;  Laterality: Right;  . REDUCTION MAMMAPLASTY Right   . SIMPLE MASTECTOMY WITH AXILLARY SENTINEL NODE BIOPSY Left 04/12/2014   Procedure: MASTECTOMY;  Surgeon: Imogene Burn. Georgette Dover, MD;  Location: [place];  Service: General;  Laterality: Left;  . TISSUE EXPANDER PLACEMENT Left 04/12/2014   Procedure: TISSUE EXPANDER;  Surgeon: Irene Limbo, MD;  Location: [place];  Service: Plastics;  Laterality: Left;  . TISSUE EXPANDER PLACEMENT Left 05/22/2014   Procedure: PLACEMENT OF TISSUE EXPANDER/I&D  WASHOUT SEROMA;  Surgeon: Irene Limbo, MD;  Location: [place];  Service: Plastics;  Laterality: Left;    There were no vitals filed for this visit.   Subjective Assessment - 06/11/20 1108    Subjective Had to take the bandages off last night to wash them.    Pertinent History mini stroke 2004, hx of L breast cancer with lumpectomy in 1998 and ALND- pt completed chemo and radiation in  1998, then she had a new cancer in her left breast in 2015 and underwent a mastectomy with lat flap reconstruction and has completed chemotherapy    Patient Stated Goals to get the swelling down    Currently in Pain? No/denies    Pain Score 0-No pain                       Outpatient [facility]  Lymphedema Life Impact Scale Total Score 61.76 %            [facility] Adult PT Treatment/Exercise - 06/11/20 0001      Manual Therapy   Manual Lymphatic Drainage (MLD) In Supine: Short neck, 5 diaphragmatic breaths, Lt inguinal and Rt axillary nodes, Lt axillo-inguinal and anterior inter-axillary anastomsosis, then Lt UE: lateral upper arm, medial to lateral, and lateral upper arm again, ant/post foreram , and dorsal hand and fingers retracing all steps directing fluid towards pathways    Compression Bandaging Coconut oil applied to Lt UE then thick stockinette, Elastomull to all 5 fingers using 2 rolls and adding 1/2" gray compression foam and large kidney shaped green foam to dorsum of hand lining up with MTP's, Artiflex x1 adding 1/4" gray compression foam to ant/post forearm, then 1-6 cm, 1-10 cm and 2-12 cm (first done in herring bone fashion) from hand to axilla                       PT Long Term Goals - 05/05/20 0849      PT LONG TERM GOAL #1   Title Pt will demonstrate a 6 cm reduction in swelling at wrist to decrease risk of cellulitis.    Baseline 27    Time 6    Period Weeks    Status New    Target Date 07/07/20      PT LONG TERM GOAL #2   Title Pt will demonstrate a 7 cm reduction in edema 15 cm proximal to ulnar styloid process to decresae risk of lymphedema.    Baseline 37.8    Time 6    Period Weeks    Status New    Target Date 07/07/20      PT LONG TERM GOAL #3   Title Pt will obtain appropriate compression garments for long term management of lymphedema.    Time 6    Period Weeks     Status New    Target Date 07/07/20      PT LONG TERM GOAL #4   Title Pt and/or spouse will be independent in long term managment of lymphedema to decrease risk of cellulitis.    Time 6    Period Weeks    Status New    Target Date 07/07/20                 Plan - 06/11/20 1203    Clinical Impression Statement Continued MLD to LUE today. LUE looked visibly reduced today but did not measure since pt had  been out of bandages for an extended period of time due to need to wash bandages. Fibrosis has greatly improved. Will measure pt at next session. Once pt reaches maximal reduction she will be ready to be measured for compression garments.    PT Frequency 3x / week    PT Duration 6 weeks    PT Treatment/Interventions ADLs/Self Care Home Management;Therapeutic exercise;Therapeutic activities;Patient/family education;Manual techniques;Manual lymph drainage;Compression bandaging;Taping;Vasopneumatic Device;Passive range of motion;Scar mobilization    PT Next Visit Plan Remeasure circumference next; Cont complete decongestive therapy, how was new foam at hand? eventually talk about compression garments, see if pt has been using pump daily. Instruct wife further in bandaging if she wants to learn.    PT Home Exercise Plan Perform remedial exercises while wearing bandages    Consulted and Agree with Plan of Care Patient           Patient will benefit from skilled therapeutic intervention in order to improve the following deficits and impairments:  Pain, Postural dysfunction, Increased edema, Decreased knowledge of precautions  Visit Diagnosis: Postmastectomy lymphedema     Problem List Patient Active Problem List   Diagnosis Date Noted  . Bunion 10/11/2018  . Essential hypertension 10/09/2018  . Depression   . Hypokalemia 04/06/2016  . Sepsis ([place]) 04/06/2016  . Cellulitis of left hand 04/06/2016  . Lymph edema   . Chronic diastolic (congestive) heart failure ([place])   . History  of therapeutic radiation 01/14/2015  . Recurrent breast cancer ([place]) 04/12/2014  . Hepatic steatosis 02/01/2014  . Malignant neoplasm of upper-outer quadrant of left breast in female, estrogen receptor positive ([place]) 10/12/2013  . Stroke [facility]) 09/13/2002    Allyson Sabal [facility] 06/11/2020, 12:05 PM  [place], [place], [address] Phone: [phone]   Fax:  [phone]  Name: Kelsey Mclaughlin MRN: [id number] Date of Birth: 1961/06/15  Manus Gunning, PT 06/11/20 12:05 PM

## 2020-06-13 ENCOUNTER — Other Ambulatory Visit: Payer: Self-pay

## 2020-06-13 ENCOUNTER — Ambulatory Visit: Payer: Federal, State, Local not specified - PPO | Attending: Oncology

## 2020-06-13 DIAGNOSIS — R293 Abnormal posture: Secondary | ICD-10-CM

## 2020-06-13 DIAGNOSIS — I972 Postmastectomy lymphedema syndrome: Secondary | ICD-10-CM

## 2020-06-13 NOTE — Therapy (Signed)
[place], [place], [address] Phone: [phone]   Fax:  [phone]  Physical Therapy Treatment  Patient Details  Name: Kelsey Mclaughlin MRN: [id number] Date of Birth: 1960/11/23 Referring Provider (PT): Magrinat   Encounter Date: 06/13/2020   PT End of Session - 06/13/20 1125    Visit Number 10    Number of Visits 19    Date for PT Re-Evaluation 07/07/20    PT Start Time 1107    PT Stop Time 1207    PT Time Calculation (min) 60 min    Activity Tolerance Patient tolerated treatment well    Behavior During Therapy [facility] for tasks assessed/performed           Past Medical History:  Diagnosis Date  . Bronchitis    completed antibiotics 10/21/13/ states resolved  . Cancer ([place]) 1998, 2015   Left Breast  . Chronic diastolic (congestive) heart failure ([place])   . Hypertension   . Lymph edema    lt arm  . Stroke [facility]) 2004   TIA-  no problems since    Past Surgical History:  Procedure Laterality Date  . ABDOMINOPLASTY/PANNICULECTOMY    . BREAST IMPLANT EXCHANGE Left 09/03/2014   Procedure: LEFT REMOVAL TISSUE EXPANDERS WITH PLACEMENT OF SILICONE IMPLANT ;  Surgeon: Irene Limbo, MD;  Location: [place];  Service: Plastics;  Laterality: Left;  . BREAST RECONSTRUCTION Left 03/07/2015   Procedure: LEFT NIPPLE AEROLA CREATION WITH LOCAL FLAP/FULL THICKNESS SKIN GRAFT FROM GROIN;  Surgeon: Irene Limbo, MD;  Location: [place];  Service: Plastics;  Laterality: Left;  . BREAST SURGERY  1998   lumpectomy on left and removed lymphnodes in [place]  . HYDRADENITIS EXCISION Left 05/30/2018   Procedure: Adjacent tissue transfer left axilla;  Surgeon: Irene Limbo, MD;  Location: [place];  Service: Plastics;  Laterality: Left;  . LATISSIMUS FLAP TO BREAST Left 04/12/2014   Procedure: LEFT LATISSIMUS DORSI FLAP FOR LEFT BREAST RECONSTRUCTION AND  PLACEMENT OF TISSUE EXPANDER;  Surgeon: Irene Limbo, MD;  Location: [place];  Service: Plastics;  Laterality: Left;  . LIPOSUCTION WITH LIPOFILLING Left 09/03/2014   Procedure:  LIPOFILLING TO LEFT CHEST  ;  Surgeon: Irene Limbo, MD;  Location: [place];  Service: Plastics;  Laterality: Left;  . LIPOSUCTION WITH LIPOFILLING Left 03/07/2015   Procedure: LIPOFILLING TO LEFT BREAST;  Surgeon: Irene Limbo, MD;  Location: [place];  Service: Plastics;  Laterality: Left;  Marland Kitchen MASTECTOMY Left 2015  . MASTOPEXY Right 09/03/2014   Procedure: RIGHT BREAST MASTOPEXY ;  Surgeon: Irene Limbo, MD;  Location: [place];  Service: Plastics;  Laterality: Right;  . PORT-A-CATH REMOVAL Right 12/24/2014   Procedure: REMOVAL PORT-A-CATH;  Surgeon: Donnie Mesa, MD;  Location: [place];  Service: General;  Laterality: Right;  . PORTACATH PLACEMENT Right 10/25/2013   Procedure: INSERTION PORT-A-CATH;  Surgeon: Imogene Burn. Georgette Dover, MD;  [facility];  Service: General;  Laterality: Right;  . REDUCTION MAMMAPLASTY Right   . SIMPLE MASTECTOMY WITH AXILLARY SENTINEL NODE BIOPSY Left 04/12/2014   Procedure: MASTECTOMY;  Surgeon: Imogene Burn. Georgette Dover, MD;  Location: [place];  Service: General;  Laterality: Left;  . TISSUE EXPANDER PLACEMENT Left 04/12/2014   Procedure: TISSUE EXPANDER;  Surgeon: Irene Limbo, MD;  Location: [place];  Service: Plastics;  Laterality: Left;  . TISSUE EXPANDER PLACEMENT Left 05/22/2014   Procedure: PLACEMENT OF TISSUE EXPANDER/I&D  WASHOUT SEROMA;  Surgeon: Irene Limbo, MD;  Location: [place];  Service: Plastics;  Laterality: Left;    There were no vitals filed for this visit.   Subjective Assessment - 06/13/20 1109    Subjective Nothing new, the bandaging is going well and my arm keeps getting smaller.    Pertinent History mini stroke 2004, hx of L breast cancer with lumpectomy in 1998 and ALND- pt completed chemo  and radiation in 1998, then she had a new cancer in her left breast in 2015 and underwent a mastectomy with lat flap reconstruction and has completed chemotherapy    Patient Stated Goals to get the swelling down    Currently in Pain? No/denies                 LYMPHEDEMA/ONCOLOGY QUESTIONNAIRE - 06/13/20 0001      Left Upper Extremity Lymphedema   15 cm Proximal to Olecranon Process 32.5 cm    10 cm Proximal to Olecranon Process 32.2 cm    Olecranon Process 31.2 cm    15 cm Proximal to Ulnar Styloid Process 32.3 cm    10 cm Proximal to Ulnar Styloid Process 30.3 cm    Just Proximal to Ulnar Styloid Process 24.6 cm    Across Hand at [employer] 24.3 cm    At [place] of 2nd Digit 8 cm                Outpatient [facility]05/[facility]  Lymphedema Life Impact Scale Total Score 61.76 %            [facility] Adult PT Treatment/Exercise - 06/13/20 0001      Manual Therapy   Manual Lymphatic Drainage (MLD) In Supine: Short neck, 5 diaphragmatic breaths, Lt inguinal and Rt axillary nodes, Lt axillo-inguinal and anterior inter-axillary anastomsosis, then Lt UE: lateral upper arm, medial to lateral, and lateral upper arm again, ant/post foreram , and dorsal hand and fingers retracing all steps directing fluid towards pathways    Compression Bandaging Coconut oil applied to Lt UE then thick stockinette, Elastomull to all 5 fingers using 2 rolls and adding 1/2" gray compression foam and large kidney shaped green foam to dorsum of hand lining up with MTP's, Artiflex x1 adding 1/4" gray compression foam to ant/post forearm, then 1-6 cm, 1-10 cm and 2-12 cm (first done in herring bone fashion) from hand to axilla                       PT Long Term Goals - 05/05/20 0849      PT LONG TERM GOAL #1   Title Pt will demonstrate a 6 cm reduction in swelling at wrist to decrease risk of cellulitis.    Baseline 27    Time 6     Period Weeks    Status New    Target Date 07/07/20      PT LONG TERM GOAL #2   Title Pt will demonstrate a 7 cm reduction in edema 15 cm proximal to ulnar styloid process to decresae risk of lymphedema.    Baseline 37.8    Time 6    Period Weeks    Status New    Target Date 07/07/20      PT LONG TERM GOAL #3   Title Pt will obtain appropriate compression garments for long term management of lymphedema.    Time 6    Period Weeks  Status New    Target Date 07/07/20      PT LONG TERM GOAL #4   Title Pt and/or spouse will be independent in long term managment of lymphedema to decrease risk of cellulitis.    Time 6    Period Weeks    Status New    Target Date 07/07/20                 Plan - 06/13/20 1127    Clinical Impression Statement Re-measured circumference of Lt UE.  Hand and wrist reduced.  Remainder of arm maintained.  If pt. maintains for another week may be ready for flat knit garmet.  Continued complete decongestive therapy. Discussed pt being measured for flat knit garments by [employer] and pt is agreeable to having her demographics sent to then for insurance verification so did this today.    Personal Factors and Comorbidities Time since onset of injury/illness/exacerbation    Stability/Clinical Decision Making Stable/Uncomplicated    Rehab Potential Good    Clinical Impairments Affecting Rehab Potential hx of radiation, long standing hx of lymphedema    PT Frequency 3x / week    PT Duration 6 weeks    PT Treatment/Interventions ADLs/Self Care Home Management;Therapeutic exercise;Therapeutic activities;Patient/family education;Manual techniques;Manual lymph drainage;Compression bandaging;Taping;Vasopneumatic Device;Passive range of motion;Scar mobilization    PT Next Visit Plan if pt. maintains well for next week may be ready to be measured for custom sleeve/glove. Continue complete decongestive therapy    PT Home Exercise Plan Perform remedial exercises while  wearing bandages    Recommended Other Services Sent demo to [facility] for day custom sleeve/glove and night velcro garment ([employer]?)    Consulted and Agree with Plan of Care Patient    Family Member Consulted spouse           Patient will benefit from skilled therapeutic intervention in order to improve the following deficits and impairments:  Pain, Postural dysfunction, Increased edema, Decreased knowledge of precautions  Visit Diagnosis: Postmastectomy lymphedema  Abnormal posture     Problem List Patient Active Problem List   Diagnosis Date Noted  . Bunion 10/11/2018  . Essential hypertension 10/09/2018  . Depression   . Hypokalemia 04/06/2016  . Sepsis ([place]) 04/06/2016  . Cellulitis of left hand 04/06/2016  . Lymph edema   . Chronic diastolic (congestive) heart failure ([place])   . History of therapeutic radiation 01/14/2015  . Recurrent breast cancer ([place]) 04/12/2014  . Hepatic steatosis 02/01/2014  . Malignant neoplasm of upper-outer quadrant of left breast in female, estrogen receptor positive ([place]) 10/12/2013  . Stroke [facility]) 09/13/2002    Otelia Limes, PTA 06/13/2020, 12:21 PM  [place], [place], [address] Phone: [phone]   Fax:  [phone]  Name: Kelsey Mclaughlin MRN: [id number] Date of Birth: [birth year]

## 2020-06-16 ENCOUNTER — Ambulatory Visit: Payer: Federal, State, Local not specified - PPO

## 2020-06-16 ENCOUNTER — Other Ambulatory Visit: Payer: Self-pay

## 2020-06-16 DIAGNOSIS — I972 Postmastectomy lymphedema syndrome: Secondary | ICD-10-CM

## 2020-06-16 DIAGNOSIS — R293 Abnormal posture: Secondary | ICD-10-CM | POA: Diagnosis not present

## 2020-06-16 NOTE — Therapy (Signed)
[place], [place], [address] Phone: [phone]   Fax:  [phone]  Physical Therapy Treatment  Patient Details  Name: Kelsey Mclaughlin MRN: [id number] Date of Birth: February 15, 1961 Referring Provider (PT): Magrinat   Encounter Date: 06/16/2020   PT End of Session - 06/16/20 1401    Visit Number 11    Number of Visits 19    Date for PT Re-Evaluation 07/07/20    PT Start Time 1304    PT Stop Time 1400    PT Time Calculation (min) 56 min    Activity Tolerance Patient tolerated treatment well    Behavior During Therapy [facility] for tasks assessed/performed           Past Medical History:  Diagnosis Date  . Bronchitis    completed antibiotics 10/21/13/ states resolved  . Cancer ([place]) 1998, 2015   Left Breast  . Chronic diastolic (congestive) heart failure ([place])   . Hypertension   . Lymph edema    lt arm  . Stroke [facility]) 2004   TIA-  no problems since    Past Surgical History:  Procedure Laterality Date  . ABDOMINOPLASTY/PANNICULECTOMY    . BREAST IMPLANT EXCHANGE Left 09/03/2014   Procedure: LEFT REMOVAL TISSUE EXPANDERS WITH PLACEMENT OF SILICONE IMPLANT ;  Surgeon: Irene Limbo, MD;  Location: [place];  Service: Plastics;  Laterality: Left;  . BREAST RECONSTRUCTION Left 03/07/2015   Procedure: LEFT NIPPLE AEROLA CREATION WITH LOCAL FLAP/FULL THICKNESS SKIN GRAFT FROM GROIN;  Surgeon: Irene Limbo, MD;  Location: [place];  Service: Plastics;  Laterality: Left;  . BREAST SURGERY  1998   lumpectomy on left and removed lymphnodes in [place]  . HYDRADENITIS EXCISION Left 05/30/2018   Procedure: Adjacent tissue transfer left axilla;  Surgeon: Irene Limbo, MD;  Location: [place];  Service: Plastics;  Laterality: Left;  . LATISSIMUS FLAP TO BREAST Left 04/12/2014   Procedure: LEFT LATISSIMUS DORSI FLAP FOR LEFT BREAST RECONSTRUCTION AND  PLACEMENT OF TISSUE EXPANDER;  Surgeon: Irene Limbo, MD;  Location: [place];  Service: Plastics;  Laterality: Left;  . LIPOSUCTION WITH LIPOFILLING Left 09/03/2014   Procedure:  LIPOFILLING TO LEFT CHEST  ;  Surgeon: Irene Limbo, MD;  Location: [place];  Service: Plastics;  Laterality: Left;  . LIPOSUCTION WITH LIPOFILLING Left 03/07/2015   Procedure: LIPOFILLING TO LEFT BREAST;  Surgeon: Irene Limbo, MD;  Location: [place];  Service: Plastics;  Laterality: Left;  Marland Kitchen MASTECTOMY Left 2015  . MASTOPEXY Right 09/03/2014   Procedure: RIGHT BREAST MASTOPEXY ;  Surgeon: Irene Limbo, MD;  Location: [place];  Service: Plastics;  Laterality: Right;  . PORT-A-CATH REMOVAL Right 12/24/2014   Procedure: REMOVAL PORT-A-CATH;  Surgeon: Donnie Mesa, MD;  Location: [place];  Service: General;  Laterality: Right;  . PORTACATH PLACEMENT Right 10/25/2013   Procedure: INSERTION PORT-A-CATH;  Surgeon: Imogene Burn. Georgette Dover, MD;  [facility]: [facility];  Service: General;  Laterality: Right;  . REDUCTION MAMMAPLASTY Right   . SIMPLE MASTECTOMY WITH AXILLARY SENTINEL NODE BIOPSY Left 04/12/2014   Procedure: MASTECTOMY;  Surgeon: Imogene Burn. Georgette Dover, MD;  Location: [place];  Service: General;  Laterality: Left;  . TISSUE EXPANDER PLACEMENT Left 04/12/2014   Procedure: TISSUE EXPANDER;  Surgeon: Irene Limbo, MD;  Location: [place];  Service: Plastics;  Laterality: Left;  . TISSUE EXPANDER PLACEMENT Left 05/22/2014   Procedure: PLACEMENT OF TISSUE EXPANDER/I&D  WASHOUT SEROMA;  Surgeon: Irene Limbo, MD;  Location: [place];  Service: Plastics;  Laterality: Left;    There were no vitals filed for this visit.   Subjective Assessment - 06/16/20 1308    Subjective Nothing new, no problems with bandaging and my skin is doing well. Took my bandages off before I came today to shower.    Pertinent History mini stroke 2004, hx of L breast cancer with  lumpectomy in 1998 and ALND- pt completed chemo and radiation in 1998, then she had a new cancer in her left breast in 2015 and underwent a mastectomy with lat flap reconstruction and has completed chemotherapy    Patient Stated Goals to get the swelling down    Currently in Pain? No/denies                       Outpatient Re[facility]  Lymphedema Life Impact Scale Total Score 61.76 %            [facility] Adult PT Treatment/Exercise - 06/16/20 0001      Manual Therapy   Manual Lymphatic Drainage (MLD) In Supine: Short neck, superficial and deep abdominals, Lt inguinal and Rt axillary nodes, Lt axillo-inguinal and anterior inter-axillary anastomsosis, then Lt UE: lateral upper arm, medial to lateral, and lateral upper arm again, ant/post foreram , and dorsal hand and fingers retracing all steps directing fluid towards pathways    Compression Bandaging Coconut oil applied to Lt UE then thick stockinette, Elastomull to all 5 fingers using 2 rolls (fixated with paper tape)and adding 1/2" gray compression foam and large kidney shaped green foam to dorsum of hand lining up with MTP's, Artiflex x1 adding 1/4" gray compression foam to ant/post forearm, then 1-6 cm, 1-10 cm and 2-12 cm (first done in herring bone fashion) from hand to axilla                       PT Long Term Goals - 05/05/20 0849      PT LONG TERM GOAL #1   Title Pt will demonstrate a 6 cm reduction in swelling at wrist to decrease risk of cellulitis.    Baseline 27    Time 6    Period Weeks    Status New    Target Date 07/07/20      PT LONG TERM GOAL #2   Title Pt will demonstrate a 7 cm reduction in edema 15 cm proximal to ulnar styloid process to decresae risk of lymphedema.    Baseline 37.8    Time 6    Period Weeks    Status New    Target Date 07/07/20      PT LONG TERM GOAL #3   Title Pt will obtain appropriate compression garments  for long term management of lymphedema.    Time 6    Period Weeks    Status New    Target Date 07/07/20      PT LONG TERM GOAL #4   Title Pt and/or spouse will be independent in long term managment of lymphedema to decrease risk of cellulitis.    Time 6    Period Weeks    Status New    Target Date 07/07/20                 Plan - 06/16/20 1401    Clinical Impression Statement Continued with complete decongestive therapy as pt continues to  tolerate this well. Overall continues with good palpable reductions with, especially at forearm, softening noted of tissue that was fibrotic when treatmet began a few weeks ago. Pt has been scheduled with fitter to be measured for custom flat knit sleeve and glove, and nighttime (TributeWrap?) garment on 06/27/20 at 1030. Demographics [facility] to [facility].    Personal Factors and Comorbidities Time since onset of injury/illness/exacerbation    Stability/Clinical Decision Making Stable/Uncomplicated    Rehab Potential Good    Clinical Impairments Affecting Rehab Potential hx of radiation, long standing hx of lymphedema    PT Frequency 3x / week    PT Duration 6 weeks    PT Treatment/Interventions ADLs/Self Care Home Management;Therapeutic exercise;Therapeutic activities;Patient/family education;Manual techniques;Manual lymph drainage;Compression bandaging;Taping;Vasopneumatic Device;Passive range of motion;Scar mobilization    PT Next Visit Plan Cont complete decongestive therapy, remeasure circumference next and pt is scheduled to be measured by [employer] fitter on 06/27/20 for day and nighttime garments.    PT Home Exercise Plan Perform remedial exercises while wearing bandages    Consulted and Agree with Plan of Care Patient           Patient will benefit from skilled therapeutic intervention in order to improve the following deficits and impairments:  Pain, Postural dysfunction, Increased edema, Decreased knowledge of precautions  Visit  Diagnosis: Postmastectomy lymphedema  Abnormal posture     Problem List Patient Active Problem List   Diagnosis Date Noted  . Bunion 10/11/2018  . Essential hypertension 10/09/2018  . Depression   . Hypokalemia 04/06/2016  . Sepsis ([place]) 04/06/2016  . Cellulitis of left hand 04/06/2016  . Lymph edema   . Chronic diastolic (congestive) heart failure ([place])   . History of therapeutic radiation 01/14/2015  . Recurrent breast cancer ([place]) 04/12/2014  . Hepatic steatosis 02/01/2014  . Malignant neoplasm of upper-outer quadrant of left breast in female, estrogen receptor positive ([place]) 10/12/2013  . Stroke [facility]) 09/13/2002    Otelia Limes , PTA 06/16/2020, 2:07 PM  [place], [place], [address] Phone: [phone]   Fax:  [phone]  Name: Kelsey Mclaughlin MRN: [id number] Date of Birth: 06-11-1961

## 2020-06-18 ENCOUNTER — Encounter: Payer: Self-pay | Admitting: Physical Therapy

## 2020-06-18 ENCOUNTER — Other Ambulatory Visit: Payer: Self-pay

## 2020-06-18 ENCOUNTER — Ambulatory Visit: Payer: Federal, State, Local not specified - PPO | Admitting: Physical Therapy

## 2020-06-18 DIAGNOSIS — I972 Postmastectomy lymphedema syndrome: Secondary | ICD-10-CM | POA: Diagnosis not present

## 2020-06-18 DIAGNOSIS — R293 Abnormal posture: Secondary | ICD-10-CM | POA: Diagnosis not present

## 2020-06-18 NOTE — Therapy (Signed)
[place], [place], [address] Phone: [phone]   Fax:  [phone]  Physical Therapy Treatment  Patient Details  Name: Kelsey Mclaughlin MRN: [id number] Date of Birth: [birth year] Referring Provider (PT): Magrinat   Encounter Date: 06/18/2020   PT End of Session - 06/18/20 1208    Visit Number 12    Number of Visits 19    PT Start Time 1106    PT Stop Time 1155    PT Time Calculation (min) 49 min    Activity Tolerance Patient tolerated treatment well    Behavior During Therapy [facility] for tasks assessed/performed           Past Medical History:  Diagnosis Date  . Bronchitis    completed antibiotics 10/21/13/ states resolved  . Cancer ([place]) 1998, 2015   Left Breast  . Chronic diastolic (congestive) heart failure ([place])   . Hypertension   . Lymph edema    lt arm  . Stroke [facility]) 2004   TIA-  no problems since    Past Surgical History:  Procedure Laterality Date  . ABDOMINOPLASTY/PANNICULECTOMY    . BREAST IMPLANT EXCHANGE Left 09/03/2014   Procedure: LEFT REMOVAL TISSUE EXPANDERS WITH PLACEMENT OF SILICONE IMPLANT ;  Surgeon: Irene Limbo, MD;  Location: [place];  Service: Plastics;  Laterality: Left;  . BREAST RECONSTRUCTION Left 03/07/2015   Procedure: LEFT NIPPLE AEROLA CREATION WITH LOCAL FLAP/FULL THICKNESS SKIN GRAFT FROM GROIN;  Surgeon: Irene Limbo, MD;  Location: [place];  Service: Plastics;  Laterality: Left;  . BREAST SURGERY  1998   lumpectomy on left and removed lymphnodes in [place]  . HYDRADENITIS EXCISION Left 05/30/2018   Procedure: Adjacent tissue transfer left axilla;  Surgeon: Irene Limbo, MD;  Location: [place];  Service: Plastics;  Laterality: Left;  . LATISSIMUS FLAP TO BREAST Left 04/12/2014   Procedure: LEFT LATISSIMUS DORSI FLAP FOR LEFT BREAST RECONSTRUCTION AND PLACEMENT OF TISSUE EXPANDER;   Surgeon: Irene Limbo, MD;  Location: [place];  Service: Plastics;  Laterality: Left;  . LIPOSUCTION WITH LIPOFILLING Left 09/03/2014   Procedure:  LIPOFILLING TO LEFT CHEST  ;  Surgeon: Irene Limbo, MD;  Location: [place];  Service: Plastics;  Laterality: Left;  . LIPOSUCTION WITH LIPOFILLING Left 03/07/2015   Procedure: LIPOFILLING TO LEFT BREAST;  Surgeon: Irene Limbo, MD;  Location: [place];  Service: Plastics;  Laterality: Left;  Marland Kitchen MASTECTOMY Left 2015  . MASTOPEXY Right 09/03/2014   Procedure: RIGHT BREAST MASTOPEXY ;  Surgeon: Irene Limbo, MD;  Location: [place];  Service: Plastics;  Laterality: Right;  . PORT-A-CATH REMOVAL Right 12/24/2014   Procedure: REMOVAL PORT-A-CATH;  Surgeon: Donnie Mesa, MD;  Location: [place];  Service: General;  Laterality: Right;  . PORTACATH PLACEMENT Right 10/25/2013   Procedure: INSERTION PORT-A-CATH;  Surgeon: Imogene Burn. Georgette Dover, MD;  [facility];  Service: General;  Laterality: Right;  . REDUCTION MAMMAPLASTY Right   . SIMPLE MASTECTOMY WITH AXILLARY SENTINEL NODE BIOPSY Left 04/12/2014   Procedure: MASTECTOMY;  Surgeon: Imogene Burn. Georgette Dover, MD;  Location: [place];  Service: General;  Laterality: Left;  . TISSUE EXPANDER PLACEMENT Left 04/12/2014   Procedure: TISSUE EXPANDER;  Surgeon: Irene Limbo, MD;  Location: [place];  Service: Plastics;  Laterality: Left;  . TISSUE EXPANDER PLACEMENT Left 05/22/2014   Procedure: PLACEMENT OF TISSUE EXPANDER/I&D WASHOUT SEROMA;  Surgeon: Irene Limbo, MD;  Location: [place];  Service: Plastics;  Laterality: Left;    There were no vitals filed for this visit.   Subjective Assessment - 06/18/20 1107    Subjective I just took the bandages off.    Pertinent History mini stroke 2004, hx of L breast cancer with lumpectomy in 1998 and ALND- pt completed chemo and radiation in 1998, then she had a new cancer in her left breast in 2015  and underwent a mastectomy with lat flap reconstruction and has completed chemotherapy    Patient Stated Goals to get the swelling down    Currently in Pain? No/denies    Pain Score 0-No pain                 LYMPHEDEMA/ONCOLOGY QUESTIONNAIRE - 06/18/20 0001      Left Upper Extremity Lymphedema   15 cm Proximal to Olecranon Process 32.5 cm (P)     Olecranon Process 31.5 cm (P)     15 cm Proximal to Ulnar Styloid Process 30.5 cm (P)     Just Proximal to Ulnar Styloid Process 24 cm (P)     Across Hand at [employer] 24.5 cm (P)     At [place] of 2nd Digit 8.1 cm (P)                 [facility]patient [facility] from [facility]05/[facility]  Lymphedema Life Impact Scale Total Score 61.76 %            [facility] Adult PT Treatment/Exercise - 06/18/20 0001      Manual Therapy   Manual Lymphatic Drainage (MLD) In Supine: Short neck, superficial and deep abdominals, Lt inguinal and Rt axillary nodes, Lt axillo-inguinal and anterior inter-axillary anastomsosis, then Lt UE: lateral upper arm, medial to lateral, and lateral upper arm again, ant/post foreram , and dorsal hand and fingers retracing all steps directing fluid towards pathways    Compression Bandaging Coconut oil applied to Lt UE then thick stockinette, Elastomull to all 5 fingers using 2 rolls (fixated with paper tape)and adding 1/2" gray compression foam and large kidney shaped green foam to dorsum of hand lining up with MTP's, Artiflex x1 adding 1/4" gray compression foam to ant/post forearm, then 1-6 cm, 1-10 cm and 2-12 cm (first done in herring bone fashion) from hand to axilla                       PT Long Term Goals - 05/05/20 0849      PT LONG TERM GOAL #1   Title Pt will demonstrate a 6 cm reduction in swelling at wrist to decrease risk of cellulitis.    Baseline 27    Time 6    Period Weeks    Status New    Target Date 07/07/20      PT LONG TERM GOAL #2   Title Pt  will demonstrate a 7 cm reduction in edema 15 cm proximal to ulnar styloid process to decresae risk of lymphedema.    Baseline 37.8    Time 6    Period Weeks    Status New    Target Date 07/07/20      PT LONG TERM GOAL #3   Title Pt will obtain appropriate compression garments for long term management of lymphedema.    Time 6    Period Weeks    Status New    Target Date 07/07/20      PT LONG  TERM GOAL #4   Title Pt and/or spouse will be independent in long term managment of lymphedema to decrease risk of cellulitis.    Time 6    Period Weeks    Status New    Target Date 07/07/20                 Plan - 06/18/20 1209    Clinical Impression Statement Continued MLD to LUE and compression bandaging. Circumference measurements taken again today continue to demonstrate a decrease in LUE. Pt is progressing towards all goals and will be measured on 06/27/20 for garments.    PT Frequency 3x / week    PT Duration 6 weeks    PT Treatment/Interventions ADLs/Self Care Home Management;Therapeutic exercise;Therapeutic activities;Patient/family education;Manual techniques;Manual lymph drainage;Compression bandaging;Taping;Vasopneumatic Device;Passive range of motion;Scar mobilization    PT Next Visit Plan Cont complete decongestive therapy, remeasure circumference next and pt is scheduled to be measured by [employer] fitter on 06/27/20 for day and nighttime garments.    PT Home Exercise Plan Perform remedial exercises while wearing bandages    Consulted and Agree with Plan of Care Patient;Family member/caregiver    Family Member Consulted spouse           Patient will benefit from skilled therapeutic intervention in order to improve the following deficits and impairments:  Pain, Postural dysfunction, Increased edema, Decreased knowledge of precautions  Visit Diagnosis: Postmastectomy lymphedema     Problem List Patient Active Problem List   Diagnosis Date Noted  . Bunion 10/11/2018   . Essential hypertension 10/09/2018  . Depression   . Hypokalemia 04/06/2016  . Sepsis ([place]) 04/06/2016  . Cellulitis of left hand 04/06/2016  . Lymph edema   . Chronic diastolic (congestive) heart failure ([place])   . History of therapeutic radiation 01/14/2015  . Recurrent breast cancer ([place]) 04/12/2014  . Hepatic steatosis 02/01/2014  . Malignant neoplasm of upper-outer quadrant of left breast in female, estrogen receptor positive ([place]) 10/12/2013  . Stroke [facility]) 09/13/2002    Allyson Sabal [facility] 06/18/2020, 12:11 PM  [place], [place], [address] Phone: [phone]   Fax:  [phone]  Name: Kelsey Mclaughlin MRN: [id number] Date of Birth: [birth year]  Manus Gunning, PT 06/18/20 12:11 PM

## 2020-06-20 ENCOUNTER — Ambulatory Visit: Payer: Federal, State, Local not specified - PPO

## 2020-06-20 ENCOUNTER — Other Ambulatory Visit: Payer: Self-pay

## 2020-06-20 DIAGNOSIS — R293 Abnormal posture: Secondary | ICD-10-CM

## 2020-06-20 DIAGNOSIS — I972 Postmastectomy lymphedema syndrome: Secondary | ICD-10-CM | POA: Diagnosis not present

## 2020-06-20 NOTE — Therapy (Signed)
[place], [place], [address] Phone: [phone]   Fax:  [phone]  Physical Therapy Treatment  Patient Details  Name: Kelsey Mclaughlin MRN: [id number] Date of Birth: [birth year] Referring Provider (PT): Magrinat   Encounter Date: 06/20/2020   PT End of Session - 06/20/20 1212    Visit Number 13    Number of Visits 19    Date for PT Re-Evaluation 07/07/20    PT Start Time 1104    PT Stop Time 1200    PT Time Calculation (min) 56 min    Activity Tolerance Patient tolerated treatment well    Behavior During Therapy [facility] for tasks assessed/performed           Past Medical History:  Diagnosis Date  . Bronchitis    completed antibiotics 10/21/13/ states resolved  . Cancer ([place]) 1998, 2015   Left Breast  . Chronic diastolic (congestive) heart failure ([place])   . Hypertension   . Lymph edema    lt arm  . Stroke [facility]) 2004   TIA-  no problems since    Past Surgical History:  Procedure Laterality Date  . ABDOMINOPLASTY/PANNICULECTOMY    . BREAST IMPLANT EXCHANGE Left 09/03/2014   Procedure: LEFT REMOVAL TISSUE EXPANDERS WITH PLACEMENT OF SILICONE IMPLANT ;  Surgeon: Irene Limbo, MD;  Location: [place];  Service: Plastics;  Laterality: Left;  . BREAST RECONSTRUCTION Left 03/07/2015   Procedure: LEFT NIPPLE AEROLA CREATION WITH LOCAL FLAP/FULL THICKNESS SKIN GRAFT FROM GROIN;  Surgeon: Irene Limbo, MD;  Location: [place];  Service: Plastics;  Laterality: Left;  . BREAST SURGERY  1998   lumpectomy on left and removed lymphnodes in [place]  . HYDRADENITIS EXCISION Left 05/30/2018   Procedure: Adjacent tissue transfer left axilla;  Surgeon: Irene Limbo, MD;  Location: [place];  Service: Plastics;  Laterality: Left;  . LATISSIMUS FLAP TO BREAST Left 04/12/2014   Procedure: LEFT LATISSIMUS DORSI FLAP FOR LEFT BREAST RECONSTRUCTION AND  PLACEMENT OF TISSUE EXPANDER;  Surgeon: Irene Limbo, MD;  Location: [place];  Service: Plastics;  Laterality: Left;  . LIPOSUCTION WITH LIPOFILLING Left 09/03/2014   Procedure:  LIPOFILLING TO LEFT CHEST  ;  Surgeon: Irene Limbo, MD;  Location: [place];  Service: Plastics;  Laterality: Left;  . LIPOSUCTION WITH LIPOFILLING Left 03/07/2015   Procedure: LIPOFILLING TO LEFT BREAST;  Surgeon: Irene Limbo, MD;  Location: [place];  Service: Plastics;  Laterality: Left;  Marland Kitchen MASTECTOMY Left 2015  . MASTOPEXY Right 09/03/2014   Procedure: RIGHT BREAST MASTOPEXY ;  Surgeon: Irene Limbo, MD;  Location: [place];  Service: Plastics;  Laterality: Right;  . PORT-A-CATH REMOVAL Right 12/24/2014   Procedure: REMOVAL PORT-A-CATH;  Surgeon: Donnie Mesa, MD;  Location: [place];  Service: General;  Laterality: Right;  . PORTACATH PLACEMENT Right 10/25/2013   Procedure: INSERTION PORT-A-CATH;  Surgeon: Imogene Burn. Georgette Dover, MD;  Location: [facility];  Service: General;  Laterality: Right;  . REDUCTION MAMMAPLASTY Right   . SIMPLE MASTECTOMY WITH AXILLARY SENTINEL NODE BIOPSY Left 04/12/2014   Procedure: MASTECTOMY;  Surgeon: Imogene Burn. Georgette Dover, MD;  Location: [place];  Service: General;  Laterality: Left;  . TISSUE EXPANDER PLACEMENT Left 04/12/2014   Procedure: TISSUE EXPANDER;  Surgeon: Irene Limbo, MD;  Location: [place];  Service: Plastics;  Laterality: Left;  . TISSUE EXPANDER PLACEMENT Left 05/22/2014   Procedure: PLACEMENT OF TISSUE EXPANDER/I&D  WASHOUT SEROMA;  Surgeon: Irene Limbo, MD;  Location: [place];  Service: Plastics;  Laterality: Left;    There were no vitals filed for this visit.   Subjective Assessment - 06/20/20 1109    Subjective Took the bandages off in the car.  I think the arm looks good. the wraps have been staying on well    Currently in Pain? No/denies    Pain Onset More than a month ago                  LYMPHEDEMA/ONCOLOGY QUESTIONNAIRE - 06/20/20 0001      Left Upper Extremity Lymphedema   15 cm Proximal to Olecranon Process 32.1 cm    Olecranon Process 31.3 cm    15 cm Proximal to Ulnar Styloid Process 30.5 cm    Just Proximal to Ulnar Styloid Process 23.5 cm    Across Hand at [employer] 24.9 cm    At [place] of 2nd Digit 8.7 cm                [facility]patient [facility]  Lymphedema Life Impact Scale Total Score 61.76 %            [facility] Adult PT Treatment/Exercise - 06/20/20 0001      Manual Therapy   Manual Lymphatic Drainage (MLD) In Supine: Short neck, superficial and deep abdominals, Lt inguinal and Rt axillary nodes, Lt axillo-inguinal and anterior inter-axillary anastomsosis, then Lt UE: lateral upper arm, medial to lateral, and lateral upper arm again, ant/post foreram , and dorsal hand and fingers retracing all steps directing fluid towards pathways    Compression Bandaging Coconut oil applied to Lt UE then thick stockinette, Elastomull to all 5 fingers using 2 rolls (fixated with paper tape)and adding 1/2" gray compression foam and large kidney shaped green foam to dorsum of hand lining up with MTP's, Artiflex x1 adding 1/4" gray compression foam to ant/post forearm, then 1-6 cm, 1-10 cm and 2-12 cm (first done in herring bone fashion) from hand to axilla                        PT Long Term Goals - 05/05/20 0849      PT LONG TERM GOAL #1   Title Pt will demonstrate a 6 cm reduction in swelling at wrist to decrease risk of cellulitis.    Baseline 27    Time 6    Period Weeks    Status New    Target Date 07/07/20      PT LONG TERM GOAL #2   Title Pt will demonstrate a 7 cm reduction in edema 15 cm proximal to ulnar styloid process to decresae risk of lymphedema.    Baseline 37.8    Time 6    Period Weeks    Status New    Target Date 07/07/20      PT LONG TERM GOAL #3    Title Pt will obtain appropriate compression garments for long term management of lymphedema.    Time 6    Period Weeks    Status New    Target Date 07/07/20      PT LONG TERM GOAL #4   Title Pt and/or spouse will be independent in long term managment of lymphedema to decrease risk of cellulitis.    Time 6    Period Weeks    Status New    Target Date 07/07/20  Plan - 06/20/20 1212    Clinical Impression Statement Continued MLD and compression bandaging.  Circumerence measure taken again today.  Arm starting to level off.  fingers and hand remain swollen    Personal Factors and Comorbidities Time since onset of injury/illness/exacerbation    Stability/Clinical Decision Making Stable/Uncomplicated    PT Frequency 3x / week    PT Duration 6 weeks    PT Treatment/Interventions ADLs/Self Care Home Management;Therapeutic exercise;Therapeutic activities;Patient/family education;Manual techniques;Manual lymph drainage;Compression bandaging;Taping;Vasopneumatic Device;Passive range of motion;Scar mobilization    PT Next Visit Plan Cont complete decongestive therapy, remeasure circumference next and pt is scheduled to be measured by [employer] fitter on 06/27/20 for day and nighttime garments.    Consulted and Agree with Plan of Care Patient;Family member/caregiver           Patient will benefit from skilled therapeutic intervention in order to improve the following deficits and impairments:  Pain, Postural dysfunction, Increased edema, Decreased knowledge of precautions  Visit Diagnosis: Postmastectomy lymphedema  Abnormal posture     Problem List Patient Active Problem List   Diagnosis Date Noted  . Bunion 10/11/2018  . Essential hypertension 10/09/2018  . Depression   . Hypokalemia 04/06/2016  . Sepsis ([place]) 04/06/2016  . Cellulitis of left hand 04/06/2016  . Lymph edema   . Chronic diastolic (congestive) heart failure ([place])   . History of therapeutic  radiation 01/14/2015  . Recurrent breast cancer ([place]) 04/12/2014  . Hepatic steatosis 02/01/2014  . Malignant neoplasm of upper-outer quadrant of left breast in female, estrogen receptor positive ([place]) 10/12/2013  . Stroke [facility]) 09/13/2002    Claris Pong, PT 06/20/2020, 12:19 PM  [place], [place], [address] Phone: [phone]   Fax:  [phone]  Name: Genia Perin MRN: [id number] Date of Birth: Feb 13, 1961

## 2020-06-23 ENCOUNTER — Ambulatory Visit: Payer: Federal, State, Local not specified - PPO

## 2020-06-23 ENCOUNTER — Other Ambulatory Visit: Payer: Self-pay

## 2020-06-23 DIAGNOSIS — I972 Postmastectomy lymphedema syndrome: Secondary | ICD-10-CM | POA: Diagnosis not present

## 2020-06-23 DIAGNOSIS — R293 Abnormal posture: Secondary | ICD-10-CM

## 2020-06-23 NOTE — Therapy (Addendum)
[place], [place], [address] Phone: [phone]   Fax:  [phone]  Physical Therapy Treatment  Patient Details  Name: Kelsey Mclaughlin MRN: [id number] Date of Birth: May 12, 1961 Referring Provider (PT): Magrinat   Encounter Date: 06/23/2020   PT End of Session - 06/23/20 1208    Visit Number 14    Number of Visits 19    Date for PT Re-Evaluation 07/07/20    PT Start Time 1108    PT Stop Time 1205    PT Time Calculation (min) 57 min    Activity Tolerance Patient tolerated treatment well    Behavior During Therapy [facility] for tasks assessed/performed           Past Medical History:  Diagnosis Date  . Bronchitis    completed antibiotics 10/21/13/ states resolved  . Cancer ([place]) 1998, 2015   Left Breast  . Chronic diastolic (congestive) heart failure ([place])   . Hypertension   . Lymph edema    lt arm  . Stroke [facility]) 2004   TIA-  no problems since    Past Surgical History:  Procedure Laterality Date  . ABDOMINOPLASTY/PANNICULECTOMY    . BREAST IMPLANT EXCHANGE Left 09/03/2014   Procedure: LEFT REMOVAL TISSUE EXPANDERS WITH PLACEMENT OF SILICONE IMPLANT ;  Surgeon: Irene Limbo, MD;  Location: [place];  Service: Plastics;  Laterality: Left;  . BREAST RECONSTRUCTION Left 03/07/2015   Procedure: LEFT NIPPLE AEROLA CREATION WITH LOCAL FLAP/FULL THICKNESS SKIN GRAFT FROM GROIN;  Surgeon: Irene Limbo, MD;  Location: [place];  Service: Plastics;  Laterality: Left;  . BREAST SURGERY  1998   lumpectomy on left and removed lymphnodes in [place]  . HYDRADENITIS EXCISION Left 05/30/2018   Procedure: Adjacent tissue transfer left axilla;  Surgeon: Irene Limbo, MD;  Location: [place];  Service: Plastics;  Laterality: Left;  . LATISSIMUS FLAP TO BREAST Left 04/12/2014   Procedure: LEFT LATISSIMUS DORSI FLAP FOR LEFT BREAST RECONSTRUCTION  AND PLACEMENT OF TISSUE EXPANDER;  Surgeon: Irene Limbo, MD;  Location: [place];  Service: Plastics;  Laterality: Left;  . LIPOSUCTION WITH LIPOFILLING Left 09/03/2014   Procedure:  LIPOFILLING TO LEFT CHEST  ;  Surgeon: Irene Limbo, MD;  Location: [place];  Service: Plastics;  Laterality: Left;  . LIPOSUCTION WITH LIPOFILLING Left 03/07/2015   Procedure: LIPOFILLING TO LEFT BREAST;  Surgeon: Irene Limbo, MD;  Location: [place];  Service: Plastics;  Laterality: Left;  Marland Kitchen MASTECTOMY Left 2015  . MASTOPEXY Right 09/03/2014   Procedure: RIGHT BREAST MASTOPEXY ;  Surgeon: Irene Limbo, MD;  Location: [place];  Service: Plastics;  Laterality: Right;  . PORT-A-CATH REMOVAL Right 12/24/2014   Procedure: REMOVAL PORT-A-CATH;  Surgeon: Donnie Mesa, MD;  Location: [place];  Service: General;  Laterality: Right;  . PORTACATH PLACEMENT Right 10/25/2013   Procedure: INSERTION PORT-A-CATH;  Surgeon: Imogene Burn. Georgette Dover, MD;  [facility];  Service: General;  Laterality: Right;  . REDUCTION MAMMAPLASTY Right   . SIMPLE MASTECTOMY WITH AXILLARY SENTINEL NODE BIOPSY Left 04/12/2014   Procedure: MASTECTOMY;  Surgeon: Imogene Burn. Georgette Dover, MD;  Location: [place];  Service: General;  Laterality: Left;  . TISSUE EXPANDER PLACEMENT Left 04/12/2014   Procedure: TISSUE EXPANDER;  Surgeon: Irene Limbo, MD;  Location: [place];  Service: Plastics;  Laterality: Left;  . TISSUE EXPANDER PLACEMENT Left 05/22/2014   Procedure: PLACEMENT OF TISSUE EXPANDER/I&D  WASHOUT SEROMA;  Surgeon: Irene Limbo, MD;  Location: [place];  Service: Plastics;  Laterality: Left;    There were no vitals filed for this visit.   Subjective Assessment - 06/23/20 1111    Subjective Bandaging is going well. Ready to be measured on Friday.    Pertinent History mini stroke 2004, hx of L breast cancer with lumpectomy in 1998 and ALND- pt completed chemo and radiation  in 1998, then she had a new cancer in her left breast in 2015 and underwent a mastectomy with lat flap reconstruction and has completed chemotherapy    Patient Stated Goals to get the swelling down    Currently in Pain? No/denies                       Outpatient [facility]  Lymphedema Life Impact Scale Total Score 61.76 %            [facility] Adult PT Treatment/Exercise - 06/23/20 0001      Manual Therapy   Manual Lymphatic Drainage (MLD) In Supine: Short neck, superficial and deep abdominals, Lt inguinal and Rt axillary nodes, Lt axillo-inguinal and anterior inter-axillary anastomsosis, then Lt UE: lateral upper arm, medial to lateral, and lateral upper arm again, ant/post foreram , and dorsal hand and fingers retracing all steps directing fluid towards pathways    Compression Bandaging Coconut oil applied to Lt UE then thick stockinette, Elastomull to all 5 fingers using 2 rolls (fixated with paper tape)and adding 1/2" gray compression foam and large kidney shaped green foam to dorsum of hand lining up with MTP's, Artiflex x1 adding 1/4" gray compression foam to ant/post forearm, then 1-6 cm, 1-10 cm and 2-12 cm (first done in herring bone fashion) from hand to axilla                       PT Long Term Goals - 05/05/20 0849      PT LONG TERM GOAL #1   Title Pt will demonstrate a 6 cm reduction in swelling at wrist to decrease risk of cellulitis.    Baseline 27    Time 6    Period Weeks    Status New    Target Date 07/07/20      PT LONG TERM GOAL #2   Title Pt will demonstrate a 7 cm reduction in edema 15 cm proximal to ulnar styloid process to decresae risk of lymphedema.    Baseline 37.8    Time 6    Period Weeks    Status New    Target Date 07/07/20      PT LONG TERM GOAL #3   Title Pt will obtain appropriate compression garments for long term management of lymphedema.    Time 6    Period  Weeks    Status New    Target Date 07/07/20      PT LONG TERM GOAL #4   Title Pt and/or spouse will be independent in long term managment of lymphedema to decrease risk of cellulitis.    Time 6    Period Weeks    Status New    Target Date 07/07/20                 Plan - 06/23/20 1208    Clinical Impression Statement Continued with complete decongestive therapy of Lt UE. Pt is tolerating all well with keeping bandages on between sessions and her  skin has no areas of redness or irritation. She is scheduled to be measured by fitter from [facility] for new flat knit day compression sleeve and glove and nighttime garments this Friday at 1030 before her 1100 appt.    Personal Factors and Comorbidities Time since onset of injury/illness/exacerbation    Stability/Clinical Decision Making Stable/Uncomplicated    Rehab Potential Good    Clinical Impairments Affecting Rehab Potential hx of radiation, long standing hx of lymphedema    PT Duration 6 weeks    PT Treatment/Interventions ADLs/Self Care Home Management;Therapeutic exercise;Therapeutic activities;Patient/family education;Manual techniques;Manual lymph drainage;Compression bandaging;Taping;Vasopneumatic Device;Passive range of motion;Scar mobilization    PT Next Visit Plan Try making chip pack for dorsal hand instead of foam?? Cont complete decongestive therapy, remeasure circumference next and pt is scheduled to be measured by [employer] fitter on 06/27/20 for day and nighttime garments.    PT Home Exercise Plan Perform remedial exercises while wearing bandages    Consulted and Agree with Plan of Care Patient;Family member/caregiver    Family Member Consulted spouse           Patient will benefit from skilled therapeutic intervention in order to improve the following deficits and impairments:  Pain, Postural dysfunction, Increased edema, Decreased knowledge of precautions  Visit Diagnosis: Postmastectomy lymphedema  Abnormal  posture     Problem List Patient Active Problem List   Diagnosis Date Noted  . Bunion 10/11/2018  . Essential hypertension 10/09/2018  . Depression   . Hypokalemia 04/06/2016  . Sepsis ([place]) 04/06/2016  . Cellulitis of left hand 04/06/2016  . Lymph edema   . Chronic diastolic (congestive) heart failure ([place])   . History of therapeutic radiation 01/14/2015  . Recurrent breast cancer ([place]) 04/12/2014  . Hepatic steatosis 02/01/2014  . Malignant neoplasm of upper-outer quadrant of left breast in female, estrogen receptor positive ([place]) 10/12/2013  . Stroke [facility]) 09/13/2002    Otelia Limes, PTA 06/23/2020, 12:49 PM  [place], [place], [address] Phone: [phone]   Fax:  [phone]  Name: Aren Cherne MRN: [id number] Date of Birth: [birth year]

## 2020-06-25 ENCOUNTER — Ambulatory Visit: Payer: Federal, State, Local not specified - PPO | Admitting: Physical Therapy

## 2020-06-25 ENCOUNTER — Other Ambulatory Visit: Payer: Self-pay

## 2020-06-25 ENCOUNTER — Encounter: Payer: Self-pay | Admitting: Physical Therapy

## 2020-06-25 DIAGNOSIS — I972 Postmastectomy lymphedema syndrome: Secondary | ICD-10-CM

## 2020-06-25 DIAGNOSIS — R293 Abnormal posture: Secondary | ICD-10-CM | POA: Diagnosis not present

## 2020-06-25 NOTE — Therapy (Signed)
Kelsey Mclaughlin, [place], [address] Phone: [phone]   Fax:  [phone]  Physical Therapy Treatment  Patient Details  Name: Kelsey Mclaughlin MRN: [id number] Date of Birth: [birth year] Referring Provider (PT): Magrinat   Encounter Date: 06/25/2020   PT End of Session - 06/25/20 1202    Visit Number 15    Number of Visits 19    Date for PT Re-Evaluation 07/07/20    PT Start Time 1102    PT Stop Time 1159    PT Time Calculation (min) 57 min    Activity Tolerance Patient tolerated treatment well    Behavior During Therapy [facility] for tasks assessed/performed           Past Medical History:  Diagnosis Date  . Bronchitis    completed antibiotics 10/21/13/ states resolved  . Cancer ([place]) 1998, 2015   Left Breast  . Chronic diastolic (congestive) heart failure ([place])   . Hypertension   . Lymph edema    lt arm  . Stroke [facility]) 2004   TIA-  no problems since    Past Surgical History:  Procedure Laterality Date  . ABDOMINOPLASTY/PANNICULECTOMY    . BREAST IMPLANT EXCHANGE Left 09/03/2014   Procedure: LEFT REMOVAL TISSUE EXPANDERS WITH PLACEMENT OF SILICONE IMPLANT ;  Surgeon: Irene Limbo, MD;  Location: [place];  Service: Plastics;  Laterality: Left;  . BREAST RECONSTRUCTION Left 03/07/2015   Procedure: LEFT NIPPLE AEROLA CREATION WITH LOCAL FLAP/FULL THICKNESS SKIN GRAFT FROM GROIN;  Surgeon: Irene Limbo, MD;  Location: [place];  Service: Plastics;  Laterality: Left;  . BREAST SURGERY  1998   lumpectomy on left and removed lymphnodes in [place]  . HYDRADENITIS EXCISION Left 05/30/2018   Procedure: Adjacent tissue transfer left axilla;  Surgeon: Irene Limbo, MD;  Location: [place];  Service: Plastics;  Laterality: Left;  . LATISSIMUS FLAP TO BREAST Left 04/12/2014   Procedure: LEFT LATISSIMUS DORSI FLAP FOR LEFT BREAST RECONSTRUCTION  AND PLACEMENT OF TISSUE EXPANDER;  Surgeon: Irene Limbo, MD;  Location: [place];  Service: Plastics;  Laterality: Left;  . LIPOSUCTION WITH LIPOFILLING Left 09/03/2014   Procedure:  LIPOFILLING TO LEFT CHEST  ;  Surgeon: Irene Limbo, MD;  Location: [place];  Service: Plastics;  Laterality: Left;  . LIPOSUCTION WITH LIPOFILLING Left 03/07/2015   Procedure: LIPOFILLING TO LEFT BREAST;  Surgeon: Irene Limbo, MD;  Location: [place];  Service: Plastics;  Laterality: Left;  Marland Kitchen MASTECTOMY Left 2015  . MASTOPEXY Right 09/03/2014   Procedure: RIGHT BREAST MASTOPEXY ;  Surgeon: Irene Limbo, MD;  Location: [place];  Service: Plastics;  Laterality: Right;  . PORT-A-CATH REMOVAL Right 12/24/2014   Procedure: REMOVAL PORT-A-CATH;  Surgeon: Donnie Mesa, MD;  Location: [place];  Service: General;  Laterality: Right;  . PORTACATH PLACEMENT Right 10/25/2013   Procedure: INSERTION PORT-A-CATH;  Surgeon: Imogene Burn. Georgette Dover, MD;  [facility];  Service: General;  Laterality: Right;  . REDUCTION MAMMAPLASTY Right   . SIMPLE MASTECTOMY WITH AXILLARY SENTINEL NODE BIOPSY Left 04/12/2014   Procedure: MASTECTOMY;  Surgeon: Imogene Burn. Georgette Dover, MD;  Location: [place];  Service: General;  Laterality: Left;  . TISSUE EXPANDER PLACEMENT Left 04/12/2014   Procedure: TISSUE EXPANDER;  Surgeon: Irene Limbo, MD;  Location: [place];  Service: Plastics;  Laterality: Left;  . TISSUE EXPANDER PLACEMENT Left 05/22/2014   Procedure: PLACEMENT OF TISSUE EXPANDER/I&D  WASHOUT SEROMA;  Surgeon: Irene Limbo, MD;  Location: [place];  Service: Plastics;  Laterality: Left;    There were no vitals filed for this visit.   Subjective Assessment - 06/25/20 1103    Subjective Everything is fine. The bandages stayed on nicely.    Patient is accompained by: Family member    Pertinent History mini stroke 2004, hx of L breast cancer with lumpectomy in 1998 and  ALND- pt completed chemo and radiation in 1998, then she had a new cancer in her left breast in 2015 and underwent a mastectomy with lat flap reconstruction and has completed chemotherapy    Patient Stated Goals to get the swelling down    Currently in Pain? No/denies    Pain Score 0-No pain                       Outpatient [facility]  Lymphedema Life Impact Scale Total Score 61.76 %            [facility] Adult PT Treatment/Exercise - 06/25/20 0001      Manual Therapy   Manual Lymphatic Drainage (MLD) In Supine: Short neck, superficial and deep abdominals, Lt inguinal and Rt axillary nodes, Lt axillo-inguinal and anterior inter-axillary anastomsosis, then Lt UE: lateral upper arm, medial to lateral, and lateral upper arm again, ant/post foreram , and dorsal hand and fingers retracing all steps directing fluid towards pathways    Compression Bandaging Coconut oil applied to Lt UE then thick stockinette, Elastomull to all 5 fingers using 2 rolls (fixated with paper tape)and adding 1/2" gray compression foam and large kidney shaped green foam to dorsum of hand lining up with MTP's, Artiflex x1 adding 1/4" gray compression foam to ant/post forearm, then 1-6 cm, 1-10 cm and 2-12 cm (first done in herring bone fashion) from hand to axilla                       PT Long Term Goals - 05/05/20 0849      PT LONG TERM GOAL #1   Title Pt will demonstrate a 6 cm reduction in swelling at wrist to decrease risk of cellulitis.    Baseline 27    Time 6    Period Weeks    Status New    Target Date 07/07/20      PT LONG TERM GOAL #2   Title Pt will demonstrate a 7 cm reduction in edema 15 cm proximal to ulnar styloid process to decresae risk of lymphedema.    Baseline 37.8    Time 6    Period Weeks    Status New    Target Date 07/07/20      PT LONG TERM GOAL #3   Title Pt will obtain appropriate compression  garments for long term management of lymphedema.    Time 6    Period Weeks    Status New    Target Date 07/07/20      PT LONG TERM GOAL #4   Title Pt and/or spouse will be independent in long term managment of lymphedema to decrease risk of cellulitis.    Time 6    Period Weeks    Status New    Target Date 07/07/20                 Plan - 06/25/20 1203    Clinical Impression Statement Continued with MLD to LUE today. Pt  continues to demonstrate softening of fibrosis. She will be measured for compression garments this Friday as she is approaching maximal reduction. Reapplied compression bandages at end of session.    PT Frequency 3x / week    PT Duration 6 weeks    PT Treatment/Interventions ADLs/Self Care Home Management;Therapeutic exercise;Therapeutic activities;Patient/family education;Manual techniques;Manual lymph drainage;Compression bandaging;Taping;Vasopneumatic Device;Passive range of motion;Scar mobilization    PT Next Visit Plan Try making chip pack for dorsal hand instead of foam?? Cont complete decongestive therapy, remeasure circumference next and pt is scheduled to be measured by [employer] fitter on 06/27/20 for day and nighttime garments.    PT Home Exercise Plan Perform remedial exercises while wearing bandages    Consulted and Agree with Plan of Care Patient;Family member/caregiver           Patient will benefit from skilled therapeutic intervention in order to improve the following deficits and impairments:  Pain, Postural dysfunction, Increased edema, Decreased knowledge of precautions  Visit Diagnosis: Postmastectomy lymphedema     Problem List Patient Active Problem List   Diagnosis Date Noted  . Bunion 10/11/2018  . Essential hypertension 10/09/2018  . Depression   . Hypokalemia 04/06/2016  . Sepsis ([place]) 04/06/2016  . Cellulitis of left hand 04/06/2016  . Lymph edema   . Chronic diastolic (congestive) heart failure ([place])   . History of  therapeutic radiation 01/14/2015  . Recurrent breast cancer ([place]) 04/12/2014  . Hepatic steatosis 02/01/2014  . Malignant neoplasm of upper-outer quadrant of left breast in female, estrogen receptor positive ([place]) 10/12/2013  . Stroke [facility]) 09/13/2002    Allyson Sabal [facility] 06/25/2020, 12:05 PM  [place], [place], [address] Phone: [phone]   Fax:  [phone]  Name: Kelsey Mclaughlin MRN: [id number] Date of Birth: 1960-12-27  Manus Gunning, PT 06/25/20 12:05 PM

## 2020-06-27 ENCOUNTER — Other Ambulatory Visit: Payer: Self-pay

## 2020-06-27 ENCOUNTER — Ambulatory Visit: Payer: Federal, State, Local not specified - PPO

## 2020-06-27 DIAGNOSIS — I972 Postmastectomy lymphedema syndrome: Secondary | ICD-10-CM | POA: Diagnosis not present

## 2020-06-27 DIAGNOSIS — R293 Abnormal posture: Secondary | ICD-10-CM

## 2020-06-27 NOTE — Therapy (Signed)
[place], [place], [address] Phone: [phone]   Fax:  [phone]  Physical Therapy Treatment  Patient Details  Name: Kelsey Mclaughlin MRN: [id number] Date of Birth: 1960-10-26 Referring Provider (PT): Magrinat   Encounter Date: 06/27/2020   PT End of Session - 06/27/20 1207    Visit Number 16    Number of Visits 19    Date for PT Re-Evaluation 07/07/20    PT Start Time 1103    PT Stop Time 1204    PT Time Calculation (min) 61 min    Activity Tolerance Patient tolerated treatment well    Behavior During Therapy [facility] for tasks assessed/performed           Past Medical History:  Diagnosis Date  . Bronchitis    completed antibiotics 10/21/13/ states resolved  . Cancer ([place]) 1998, 2015   Left Breast  . Chronic diastolic (congestive) heart failure ([place])   . Hypertension   . Lymph edema    lt arm  . Stroke [facility]) 2004   TIA-  no problems since    Past Surgical History:  Procedure Laterality Date  . ABDOMINOPLASTY/PANNICULECTOMY    . BREAST IMPLANT EXCHANGE Left 09/03/2014   Procedure: LEFT REMOVAL TISSUE EXPANDERS WITH PLACEMENT OF SILICONE IMPLANT ;  Surgeon: Irene Limbo, MD;  Location: [place];  Service: Plastics;  Laterality: Left;  . BREAST RECONSTRUCTION Left 03/07/2015   Procedure: LEFT NIPPLE AEROLA CREATION WITH LOCAL FLAP/FULL THICKNESS SKIN GRAFT FROM GROIN;  Surgeon: Irene Limbo, MD;  Location: [place];  Service: Plastics;  Laterality: Left;  . BREAST SURGERY  1998   lumpectomy on left and removed lymphnodes in [place]  . HYDRADENITIS EXCISION Left 05/30/2018   Procedure: Adjacent tissue transfer left axilla;  Surgeon: Irene Limbo, MD;  Location: [place];  Service: Plastics;  Laterality: Left;  . LATISSIMUS FLAP TO BREAST Left 04/12/2014   Procedure: LEFT LATISSIMUS DORSI FLAP FOR LEFT BREAST RECONSTRUCTION  AND PLACEMENT OF TISSUE EXPANDER;  Surgeon: Irene Limbo, MD;  Location: [place];  Service: Plastics;  Laterality: Left;  . LIPOSUCTION WITH LIPOFILLING Left 09/03/2014   Procedure:  LIPOFILLING TO LEFT CHEST  ;  Surgeon: Irene Limbo, MD;  Location: [place];  Service: Plastics;  Laterality: Left;  . LIPOSUCTION WITH LIPOFILLING Left 03/07/2015   Procedure: LIPOFILLING TO LEFT BREAST;  Surgeon: Irene Limbo, MD;  Location: [place];  Service: Plastics;  Laterality: Left;  Marland Kitchen MASTECTOMY Left 2015  . MASTOPEXY Right 09/03/2014   Procedure: RIGHT BREAST MASTOPEXY ;  Surgeon: Irene Limbo, MD;  Location: [place];  Service: Plastics;  Laterality: Right;  . PORT-A-CATH REMOVAL Right 12/24/2014   Procedure: REMOVAL PORT-A-CATH;  Surgeon: Donnie Mesa, MD;  Location: [place];  Service: General;  Laterality: Right;  . PORTACATH PLACEMENT Right 10/25/2013   Procedure: INSERTION PORT-A-CATH;  Surgeon: Imogene Burn. Georgette Dover, MD;  [facility];  Service: General;  Laterality: Right;  . REDUCTION MAMMAPLASTY Right   . SIMPLE MASTECTOMY WITH AXILLARY SENTINEL NODE BIOPSY Left 04/12/2014   Procedure: MASTECTOMY;  Surgeon: Imogene Burn. Georgette Dover, MD;  Location: [place];  Service: General;  Laterality: Left;  . TISSUE EXPANDER PLACEMENT Left 04/12/2014   Procedure: TISSUE EXPANDER;  Surgeon: Irene Limbo, MD;  Location: [place];  Service: Plastics;  Laterality: Left;  . TISSUE EXPANDER PLACEMENT Left 05/22/2014   Procedure: PLACEMENT OF TISSUE EXPANDER/I&D  WASHOUT SEROMA;  Surgeon: Irene Limbo, MD;  Location: [place];  Service: Plastics;  Laterality: Left;    There were no vitals filed for this visit.   Subjective Assessment - 06/27/20 1103    Subjective Got measured today for garments for for day time and night time.  Wraps have been staying on well. Not doing MLD at home    Patient is accompained by: Family member    Pertinent  History mini stroke 2004, hx of L breast cancer with lumpectomy in 1998 and ALND- pt completed chemo and radiation in 1998, then she had a new cancer in her left breast in 2015 and underwent a mastectomy with lat flap reconstruction and has completed chemotherapy    Patient Stated Goals to get the swelling down    Currently in Pain? No/denies                       Outpatient [facility]05/[facility]  Lymphedema Life Impact Scale Total Score 61.76 %            [facility] Adult PT Treatment/Exercise - 06/27/20 0001      Manual Therapy   Manual Lymphatic Drainage (MLD) In Supine: Short neck, superficial and deep abdominals, Lt inguinal and Rt axillary nodes, Lt axillo-inguinal and anterior inter-axillary anastomsosis, then Lt UE: lateral upper arm, medial to lateral, and lateral upper arm again, ant/post foreram , and dorsal hand and fingers retracing all steps directing fluid towards pathways    Compression Bandaging Coconut oil applied to Lt UE then thick stockinette, Elastomull to all 5 fingers using 2 rolls (fixated with paper tape)and adding 1/2" gray compression foam and large kidney shaped green foam to dorsum of hand lining up with MTP's, Artiflex x1 adding 1/4" gray compression foam to ant/post forearm, then 1-6 cm, 1-10 cm and 2-12 cm (first done in herring bone fashion) from hand to axilla                       PT Long Term Goals - 05/05/20 0849      PT LONG TERM GOAL #1   Title Pt will demonstrate a 6 cm reduction in swelling at wrist to decrease risk of cellulitis.    Baseline 27    Time 6    Period Weeks    Status New    Target Date 07/07/20      PT LONG TERM GOAL #2   Title Pt will demonstrate a 7 cm reduction in edema 15 cm proximal to ulnar styloid process to decresae risk of lymphedema.    Baseline 37.8    Time 6    Period Weeks    Status New    Target Date 07/07/20      PT LONG TERM GOAL #3    Title Pt will obtain appropriate compression garments for long term management of lymphedema.    Time 6    Period Weeks    Status New    Target Date 07/07/20      PT LONG TERM GOAL #4   Title Pt and/or spouse will be independent in long term managment of lymphedema to decrease risk of cellulitis.    Time 6    Period Weeks    Status New    Target Date 07/07/20                 Plan - 06/27/20 1208    Clinical  Impression Statement Pt. was measured for daytime and night time compression garments prior to therapy by Melissa.  Hand appeared more swollen today, but she had been out of wraps for 45 minutes prior to therapy.  continued MLD and compression bandaging as indicated.  Hand reduced significantly with MLD    Personal Factors and Comorbidities Time since onset of injury/illness/exacerbation    Stability/Clinical Decision Making Stable/Uncomplicated    Rehab Potential Good    Clinical Impairments Affecting Rehab Potential hx of radiation, long standing hx of lymphedema    PT Frequency 3x / week    PT Duration 6 weeks    PT Treatment/Interventions ADLs/Self Care Home Management;Therapeutic exercise;Therapeutic activities;Patient/family education;Manual techniques;Manual lymph drainage;Compression bandaging;Taping;Vasopneumatic Device;Passive range of motion;Scar mobilization    PT Next Visit Plan Consider chip pack for hand, Continue CDP until pt. gets her garments    PT Home Exercise Plan Perform remedial exercises while wearing bandages    Recommended Other Services measured today for garments by [facility]    Consulted and Agree with Plan of Care Patient;Family member/caregiver    Family Member Consulted spouse           Patient will benefit from skilled therapeutic intervention in order to improve the following deficits and impairments:  Pain, Postural dysfunction, Increased edema, Decreased knowledge of precautions  Visit Diagnosis: Abnormal posture     Problem  List Patient Active Problem List   Diagnosis Date Noted  . Bunion 10/11/2018  . Essential hypertension 10/09/2018  . Depression   . Hypokalemia 04/06/2016  . Sepsis ([place]) 04/06/2016  . Cellulitis of left hand 04/06/2016  . Lymph edema   . Chronic diastolic (congestive) heart failure ([place])   . History of therapeutic radiation 01/14/2015  . Recurrent breast cancer ([place]) 04/12/2014  . Hepatic steatosis 02/01/2014  . Malignant neoplasm of upper-outer quadrant of left breast in female, estrogen receptor positive ([place]) 10/12/2013  . Stroke [facility]) 09/13/2002    Claris Pong, PT 06/27/2020, 12:12 PM  [place], [place], [address] Phone: [phone]   Fax:  [phone]  Name: Fatisha Rabalais MRN: [id number] Date of Birth: 12-18-1960

## 2020-06-30 ENCOUNTER — Ambulatory Visit: Payer: Federal, State, Local not specified - PPO

## 2020-06-30 ENCOUNTER — Other Ambulatory Visit: Payer: Self-pay

## 2020-06-30 DIAGNOSIS — I972 Postmastectomy lymphedema syndrome: Secondary | ICD-10-CM | POA: Diagnosis not present

## 2020-06-30 DIAGNOSIS — R293 Abnormal posture: Secondary | ICD-10-CM | POA: Diagnosis not present

## 2020-06-30 NOTE — Therapy (Signed)
[place], [place], [address] Phone: [phone]   Fax:  [phone]  Physical Therapy Treatment  Patient Details  Name: Kelsey Mclaughlin MRN: [id number] Date of Birth: 06/19/1961 Referring Provider (PT): Magrinat   Encounter Date: 06/30/2020   PT End of Session - 06/30/20 1405    Visit Number 17    Number of Visits 19    Date for PT Re-Evaluation 07/07/20    PT Start Time 1307    PT Stop Time 1404    PT Time Calculation (min) 57 min    Activity Tolerance Patient tolerated treatment well    Behavior During Therapy [facility] for tasks assessed/performed           Past Medical History:  Diagnosis Date  . Bronchitis    completed antibiotics 10/21/13/ states resolved  . Cancer ([place]) 1998, 2015   Left Breast  . Chronic diastolic (congestive) heart failure ([place])   . Hypertension   . Lymph edema    lt arm  . Stroke [facility]) 2004   TIA-  no problems since    Past Surgical History:  Procedure Laterality Date  . ABDOMINOPLASTY/PANNICULECTOMY    . BREAST IMPLANT EXCHANGE Left 09/03/2014   Procedure: LEFT REMOVAL TISSUE EXPANDERS WITH PLACEMENT OF SILICONE IMPLANT ;  Surgeon: Irene Limbo, MD;  Location: [place];  Service: Plastics;  Laterality: Left;  . BREAST RECONSTRUCTION Left 03/07/2015   Procedure: LEFT NIPPLE AEROLA CREATION WITH LOCAL FLAP/FULL THICKNESS SKIN GRAFT FROM GROIN;  Surgeon: Irene Limbo, MD;  Location: [place];  Service: Plastics;  Laterality: Left;  . BREAST SURGERY  1998   lumpectomy on left and removed lymphnodes in [place]  . HYDRADENITIS EXCISION Left 05/30/2018   Procedure: Adjacent tissue transfer left axilla;  Surgeon: Irene Limbo, MD;  Location: [place];  Service: Plastics;  Laterality: Left;  . LATISSIMUS FLAP TO BREAST Left 04/12/2014   Procedure: LEFT LATISSIMUS DORSI FLAP FOR LEFT BREAST RECONSTRUCTION  AND PLACEMENT OF TISSUE EXPANDER;  Surgeon: Irene Limbo, MD;  Location: [place];  Service: Plastics;  Laterality: Left;  . LIPOSUCTION WITH LIPOFILLING Left 09/03/2014   Procedure:  LIPOFILLING TO LEFT CHEST  ;  Surgeon: Irene Limbo, MD;  Location: [place];  Service: Plastics;  Laterality: Left;  . LIPOSUCTION WITH LIPOFILLING Left 03/07/2015   Procedure: LIPOFILLING TO LEFT BREAST;  Surgeon: Irene Limbo, MD;  Location: [place];  Service: Plastics;  Laterality: Left;  Marland Kitchen MASTECTOMY Left 2015  . MASTOPEXY Right 09/03/2014   Procedure: RIGHT BREAST MASTOPEXY ;  Surgeon: Irene Limbo, MD;  Location: [place];  Service: Plastics;  Laterality: Right;  . PORT-A-CATH REMOVAL Right 12/24/2014   Procedure: REMOVAL PORT-A-CATH;  Surgeon: Donnie Mesa, MD;  Location: [place];  Service: General;  Laterality: Right;  . PORTACATH PLACEMENT Right 10/25/2013   Procedure: INSERTION PORT-A-CATH;  Surgeon: Imogene Burn. Georgette Dover, MD;  Location: [facility];  Service: General;  Laterality: Right;  . REDUCTION MAMMAPLASTY Right   . SIMPLE MASTECTOMY WITH AXILLARY SENTINEL NODE BIOPSY Left 04/12/2014   Procedure: MASTECTOMY;  Surgeon: Imogene Burn. Georgette Dover, MD;  Location: [place];  Service: General;  Laterality: Left;  . TISSUE EXPANDER PLACEMENT Left 04/12/2014   Procedure: TISSUE EXPANDER;  Surgeon: Irene Limbo, MD;  Location: [place];  Service: Plastics;  Laterality: Left;  . TISSUE EXPANDER PLACEMENT Left 05/22/2014   Procedure: PLACEMENT OF TISSUE EXPANDER/I&D  WASHOUT SEROMA;  Surgeon: Irene Limbo, MD;  Location: [place];  Service: Plastics;  Laterality: Left;    There were no vitals filed for this visit.   Subjective Assessment - 06/30/20 1307    Subjective Nothing new, everything is the same.    Pertinent History mini stroke 2004, hx of L breast cancer with lumpectomy in 1998 and ALND- pt completed chemo and radiation in 1998, then she  had a new cancer in her left breast in 2015 and underwent a mastectomy with lat flap reconstruction and has completed chemotherapy    Patient Stated Goals to get the swelling down    Currently in Pain? No/denies                 LYMPHEDEMA/ONCOLOGY QUESTIONNAIRE - 06/30/20 0001      Left Upper Extremity Lymphedema   15 cm Proximal to Olecranon Process 31.7 cm    10 cm Proximal to Olecranon Process 31.7 cm    Olecranon Process 31.2 cm    15 cm Proximal to Ulnar Styloid Process 31.7 cm    10 cm Proximal to Ulnar Styloid Process 29.2 cm    Just Proximal to Ulnar Styloid Process 22.9 cm    Across Hand at [employer] 23.9 cm    At [place] of 2nd Digit 7.8 cm                Outpatient [facility]  Lymphedema Life Impact Scale Total Score 61.76 %            [facility] Adult PT Treatment/Exercise - 06/30/20 0001      Manual Therapy   Manual Lymphatic Drainage (MLD) In Supine: Short neck, superficial and deep abdominals, Lt inguinal and Rt axillary nodes, Lt axillo-inguinal and anterior inter-axillary anastomsosis, then Lt UE: lateral upper arm, medial to lateral, and lateral upper arm again, ant/post foreram , and dorsal hand and fingers retracing all steps directing fluid towards pathways    Compression Bandaging Coconut oil applied to Lt UE then thick stockinette, Elastomull to all 5 fingers using 2 rolls (fixated with paper tape)and chip pack to dorsal hand, Artiflex x1 adding 1/4" gray compression foam to ant/post forearm, then 1-6 cm, 1-10 cm and 2-12 cm (first done in herring bone fashion) from hand to axilla                       PT Long Term Goals - 05/05/20 0849      PT LONG TERM GOAL #1   Title Pt will demonstrate a 6 cm reduction in swelling at wrist to decrease risk of cellulitis.    Baseline 27    Time 6    Period Weeks    Status New    Target Date 07/07/20      PT LONG TERM GOAL #2    Title Pt will demonstrate a 7 cm reduction in edema 15 cm proximal to ulnar styloid process to decresae risk of lymphedema.    Baseline 37.8    Time 6    Period Weeks    Status New    Target Date 07/07/20      PT LONG TERM GOAL #3   Title Pt will obtain appropriate compression garments for long term management of lymphedema.    Time 6    Period Weeks    Status New    Target Date 07/07/20      PT LONG TERM GOAL #  4   Title Pt and/or spouse will be independent in long term managment of lymphedema to decrease risk of cellulitis.    Time 6    Period Weeks    Status New    Target Date 07/07/20                 Plan - 06/30/20 1405    Clinical Impression Statement Pts hand and forearm were palpably softer today and her upper arm circumference measurements had reduced. Continued with complete decongestive therapy for Lt UE trying chip pack to dorsal hand today instead of both foams. Pt will benefit from continued physical therapy until her new compression garments arrive to prevent influx of increased lymphedema before garments arrive.    Personal Factors and Comorbidities Time since onset of injury/illness/exacerbation    Stability/Clinical Decision Making Stable/Uncomplicated    Rehab Potential Good    Clinical Impairments Affecting Rehab Potential hx of radiation, long standing hx of lymphedema    PT Frequency 3x / week    PT Duration 6 weeks    PT Treatment/Interventions ADLs/Self Care Home Management;Therapeutic exercise;Therapeutic activities;Patient/family education;Manual techniques;Manual lymph drainage;Compression bandaging;Taping;Vasopneumatic Device;Passive range of motion;Scar mobilization    PT Next Visit Plan Assess chip pack for hand, Continue CDT until pt. gets her garments    PT Home Exercise Plan Perform remedial exercises while wearing bandages    Consulted and Agree with Plan of Care Patient           Patient will benefit from skilled therapeutic  intervention in order to improve the following deficits and impairments:  Pain, Postural dysfunction, Increased edema, Decreased knowledge of precautions  Visit Diagnosis: Abnormal posture  Postmastectomy lymphedema     Problem List Patient Active Problem List   Diagnosis Date Noted  . Bunion 10/11/2018  . Essential hypertension 10/09/2018  . Depression   . Hypokalemia 04/06/2016  . Sepsis ([place]) 04/06/2016  . Cellulitis of left hand 04/06/2016  . Lymph edema   . Chronic diastolic (congestive) heart failure ([place])   . History of therapeutic radiation 01/14/2015  . Recurrent breast cancer ([place]) 04/12/2014  . Hepatic steatosis 02/01/2014  . Malignant neoplasm of upper-outer quadrant of left breast in female, estrogen receptor positive ([place]) 10/12/2013  . Stroke [facility]) 09/13/2002    Otelia Limes, PTA 06/30/2020, 3:06 PM  [place], [place], [address] Phone: [phone]   Fax:  [phone]  Name: Kelsey Mclaughlin MRN: [id number] Date of Birth: December 02, 1960

## 2020-07-02 ENCOUNTER — Other Ambulatory Visit: Payer: Self-pay

## 2020-07-02 ENCOUNTER — Encounter: Payer: Self-pay | Admitting: Physical Therapy

## 2020-07-02 ENCOUNTER — Ambulatory Visit: Payer: Federal, State, Local not specified - PPO | Admitting: Physical Therapy

## 2020-07-02 DIAGNOSIS — I972 Postmastectomy lymphedema syndrome: Secondary | ICD-10-CM

## 2020-07-02 DIAGNOSIS — R293 Abnormal posture: Secondary | ICD-10-CM | POA: Diagnosis not present

## 2020-07-02 NOTE — Therapy (Signed)
[place], [place], [address] Phone: [phone]   Fax:  [phone]  Physical Therapy Treatment  Patient Details  Name: Kelsey Mclaughlin MRN: [id number] Date of Birth: [birth year] Referring Provider (PT): Magrinat   Encounter Date: 07/02/2020   PT End of Session - 07/02/20 1203    Visit Number 18    Number of Visits 19    Date for PT Re-Evaluation 07/07/20    PT Start Time 1109   pt arrived late   PT Stop Time 1200    PT Time Calculation (min) 51 min    Activity Tolerance Patient tolerated treatment well    Behavior During Therapy [facility] for tasks assessed/performed           Past Medical History:  Diagnosis Date  . Bronchitis    completed antibiotics 10/21/13/ states resolved  . Cancer ([place]) 1998, 2015   Left Breast  . Chronic diastolic (congestive) heart failure ([place])   . Hypertension   . Lymph edema    lt arm  . Stroke [facility]) 2004   TIA-  no problems since    Past Surgical History:  Procedure Laterality Date  . ABDOMINOPLASTY/PANNICULECTOMY    . BREAST IMPLANT EXCHANGE Left 09/03/2014   Procedure: LEFT REMOVAL TISSUE EXPANDERS WITH PLACEMENT OF SILICONE IMPLANT ;  Surgeon: Irene Limbo, MD;  Location: [place];  Service: Plastics;  Laterality: Left;  . BREAST RECONSTRUCTION Left 03/07/2015   Procedure: LEFT NIPPLE AEROLA CREATION WITH LOCAL FLAP/FULL THICKNESS SKIN GRAFT FROM GROIN;  Surgeon: Irene Limbo, MD;  Location: [place];  Service: Plastics;  Laterality: Left;  . BREAST SURGERY  1998   lumpectomy on left and removed lymphnodes in [place]  . HYDRADENITIS EXCISION Left 05/30/2018   Procedure: Adjacent tissue transfer left axilla;  Surgeon: Irene Limbo, MD;  Location: [place];  Service: Plastics;  Laterality: Left;  . LATISSIMUS FLAP TO BREAST Left 04/12/2014   Procedure: LEFT LATISSIMUS DORSI FLAP FOR LEFT BREAST  RECONSTRUCTION AND PLACEMENT OF TISSUE EXPANDER;  Surgeon: Irene Limbo, MD;  Location: [place];  Service: Plastics;  Laterality: Left;  . LIPOSUCTION WITH LIPOFILLING Left 09/03/2014   Procedure:  LIPOFILLING TO LEFT CHEST  ;  Surgeon: Irene Limbo, MD;  Location: [place];  Service: Plastics;  Laterality: Left;  . LIPOSUCTION WITH LIPOFILLING Left 03/07/2015   Procedure: LIPOFILLING TO LEFT BREAST;  Surgeon: Irene Limbo, MD;  Location: [place];  Service: Plastics;  Laterality: Left;  Marland Kitchen MASTECTOMY Left 2015  . MASTOPEXY Right 09/03/2014   Procedure: RIGHT BREAST MASTOPEXY ;  Surgeon: Irene Limbo, MD;  Location: [place];  Service: Plastics;  Laterality: Right;  . PORT-A-CATH REMOVAL Right 12/24/2014   Procedure: REMOVAL PORT-A-CATH;  Surgeon: Donnie Mesa, MD;  Location: [place];  Service: General;  Laterality: Right;  . PORTACATH PLACEMENT Right 10/25/2013   Procedure: INSERTION PORT-A-CATH;  Surgeon: Imogene Burn. Georgette Dover, MD;  [facility];  Service: General;  Laterality: Right;  . REDUCTION MAMMAPLASTY Right   . SIMPLE MASTECTOMY WITH AXILLARY SENTINEL NODE BIOPSY Left 04/12/2014   Procedure: MASTECTOMY;  Surgeon: Imogene Burn. Georgette Dover, MD;  Location: [place];  Service: General;  Laterality: Left;  . TISSUE EXPANDER PLACEMENT Left 04/12/2014   Procedure: TISSUE EXPANDER;  Surgeon: Irene Limbo, MD;  Location: [place];  Service: Plastics;  Laterality: Left;  . TISSUE EXPANDER PLACEMENT Left 05/22/2014   Procedure:  PLACEMENT OF TISSUE EXPANDER/I&D WASHOUT SEROMA;  Surgeon: Irene Limbo, MD;  Location: [place];  Service: Plastics;  Laterality: Left;    There were no vitals filed for this visit.   Subjective Assessment - 07/02/20 1110    Subjective The chip pack worked well.    Pertinent History mini stroke 2004, hx of L breast cancer with lumpectomy in 1998 and ALND- pt completed chemo and radiation in 1998, then  she had a new cancer in her left breast in 2015 and underwent a mastectomy with lat flap reconstruction and has completed chemotherapy    Patient Stated Goals to get the swelling down    Currently in Pain? No/denies    Pain Score 0-No pain                       Outpatient [facility]05/[facility]  Lymphedema Life Impact Scale Total Score 61.76 %            [facility] Adult PT Treatment/Exercise - 07/02/20 0001      Manual Therapy   Manual Lymphatic Drainage (MLD) In Supine: Short neck, superficial and deep abdominals, Lt inguinal and Rt axillary nodes, Lt axillo-inguinal and anterior inter-axillary anastomsosis, then Lt UE: lateral upper arm, medial to lateral, and lateral upper arm again, ant/post foreram , and dorsal hand and fingers retracing all steps directing fluid towards pathways    Compression Bandaging Coconut oil applied to Lt UE then thick stockinette, Elastomull to all 5 fingers using 2 rolls (fixated with paper tape)and chip pack to dorsal hand, Artiflex x1 adding 1/4" gray compression foam to ant/post forearm, then 1-6 cm, 1-10 cm and 2-12 cm (first done in herring bone fashion) from hand to axilla                       PT Long Term Goals - 05/05/20 0849      PT LONG TERM GOAL #1   Title Pt will demonstrate a 6 cm reduction in swelling at wrist to decrease risk of cellulitis.    Baseline 27    Time 6    Period Weeks    Status New    Target Date 07/07/20      PT LONG TERM GOAL #2   Title Pt will demonstrate a 7 cm reduction in edema 15 cm proximal to ulnar styloid process to decresae risk of lymphedema.    Baseline 37.8    Time 6    Period Weeks    Status New    Target Date 07/07/20      PT LONG TERM GOAL #3   Title Pt will obtain appropriate compression garments for long term management of lymphedema.    Time 6    Period Weeks    Status New    Target Date 07/07/20      PT LONG TERM GOAL  #4   Title Pt and/or spouse will be independent in long term managment of lymphedema to decrease risk of cellulitis.    Time 6    Period Weeks    Status New    Target Date 07/07/20                 Plan - 07/02/20 1203    Clinical Impression Statement Continued with MLD to LUE. Pt's left UE was much softer today throughout. She feels the chip pack helped on the dorsum of the hand so it was replaced  again today with compression bandaging. WIll continue complete decongestive therapy until her new compression garments arrive.    PT Frequency 3x / week    PT Duration 6 weeks    PT Treatment/Interventions ADLs/Self Care Home Management;Therapeutic exercise;Therapeutic activities;Patient/family education;Manual techniques;Manual lymph drainage;Compression bandaging;Taping;Vasopneumatic Device;Passive range of motion;Scar mobilization    PT Next Visit Plan Assess chip pack for hand, Continue CDT until pt. gets her garments    PT Home Exercise Plan Perform remedial exercises while wearing bandages    Consulted and Agree with Plan of Care Patient           Patient will benefit from skilled therapeutic intervention in order to improve the following deficits and impairments:  Pain, Postural dysfunction, Increased edema, Decreased knowledge of precautions  Visit Diagnosis: Postmastectomy lymphedema     Problem List Patient Active Problem List   Diagnosis Date Noted  . Bunion 10/11/2018  . Essential hypertension 10/09/2018  . Depression   . Hypokalemia 04/06/2016  . Sepsis ([place]) 04/06/2016  . Cellulitis of left hand 04/06/2016  . Lymph edema   . Chronic diastolic (congestive) heart failure ([place])   . History of therapeutic radiation 01/14/2015  . Recurrent breast cancer ([place]) 04/12/2014  . Hepatic steatosis 02/01/2014  . Malignant neoplasm of upper-outer quadrant of left breast in female, estrogen receptor positive ([place]) 10/12/2013  . Stroke [facility]) 09/13/2002    Allyson Sabal [facility] 07/02/2020, 12:05 PM  [place], [place], [address] Phone: [phone]   Fax:  [phone]  Name: Terrel Manalo MRN: [id number] Date of Birth: 1961-07-28  Manus Gunning, PT 07/02/20 12:05 PM

## 2020-07-04 ENCOUNTER — Ambulatory Visit: Payer: Federal, State, Local not specified - PPO

## 2020-07-04 ENCOUNTER — Other Ambulatory Visit: Payer: Self-pay

## 2020-07-04 DIAGNOSIS — R293 Abnormal posture: Secondary | ICD-10-CM | POA: Diagnosis not present

## 2020-07-04 DIAGNOSIS — I972 Postmastectomy lymphedema syndrome: Secondary | ICD-10-CM | POA: Diagnosis not present

## 2020-07-04 NOTE — Therapy (Signed)
[place], [place], [address] Phone: [phone]   Fax:  [phone]  Physical Therapy Treatment  Patient Details  Name: Kelsey Mclaughlin MRN: [id number] Date of Birth: [birth year] Referring Provider (PT): Magrinat   Encounter Date: 07/04/2020   PT End of Session - 07/04/20 1213    Visit Number 19    Number of Visits 25    Date for PT Re-Evaluation 07/18/20    PT Start Time 1103    PT Stop Time 1202    PT Time Calculation (min) 59 min    Activity Tolerance Patient tolerated treatment well    Behavior During Therapy [facility] for tasks assessed/performed           Past Medical History:  Diagnosis Date   Bronchitis    completed antibiotics 10/21/13/ states resolved   Cancer ([place]) 1998, 2015   Left Breast   Chronic diastolic (congestive) heart failure ([place])    Hypertension    Lymph edema    lt arm   Stroke ([place]) 2004   TIA-  no problems since    Past Surgical History:  Procedure Laterality Date   ABDOMINOPLASTY/PANNICULECTOMY     BREAST IMPLANT EXCHANGE Left 09/03/2014   Procedure: LEFT REMOVAL TISSUE EXPANDERS WITH PLACEMENT OF SILICONE IMPLANT ;  Surgeon: Irene Limbo, MD;  Location: [place];  Service: Plastics;  Laterality: Left;   BREAST RECONSTRUCTION Left 03/07/2015   Procedure: LEFT NIPPLE AEROLA CREATION WITH LOCAL FLAP/FULL THICKNESS SKIN GRAFT FROM GROIN;  Surgeon: Irene Limbo, MD;  Location: [place];  Service: Plastics;  Laterality: Left;   BREAST SURGERY  1998   lumpectomy on left and removed lymphnodes in [place]   HYDRADENITIS EXCISION Left 05/30/2018   Procedure: Adjacent tissue transfer left axilla;  Surgeon: Irene Limbo, MD;  Location: [place];  Service: Plastics;  Laterality: Left;   LATISSIMUS FLAP TO BREAST Left 04/12/2014   Procedure: LEFT LATISSIMUS DORSI FLAP FOR LEFT BREAST RECONSTRUCTION  AND PLACEMENT OF TISSUE EXPANDER;  Surgeon: Irene Limbo, MD;  Location: [place];  Service: Plastics;  Laterality: Left;   LIPOSUCTION WITH LIPOFILLING Left 09/03/2014   Procedure:  LIPOFILLING TO LEFT CHEST  ;  Surgeon: Irene Limbo, MD;  Location: [place];  Service: Plastics;  Laterality: Left;   LIPOSUCTION WITH LIPOFILLING Left 03/07/2015   Procedure: LIPOFILLING TO LEFT BREAST;  Surgeon: Irene Limbo, MD;  Location: [place];  Service: Plastics;  Laterality: Left;   MASTECTOMY Left 2015   MASTOPEXY Right 09/03/2014   Procedure: RIGHT BREAST MASTOPEXY ;  Surgeon: Irene Limbo, MD;  Location: [place];  Service: Plastics;  Laterality: Right;   PORT-A-CATH REMOVAL Right 12/24/2014   Procedure: REMOVAL PORT-A-CATH;  Surgeon: Donnie Mesa, MD;  Location: [place];  Service: General;  Laterality: Right;   PORTACATH PLACEMENT Right 10/25/2013   Procedure: INSERTION PORT-A-CATH;  Surgeon: Imogene Burn. Georgette Dover, MD;  Location[facility];  Service: General;  Laterality: Right;   REDUCTION MAMMAPLASTY Right    SIMPLE MASTECTOMY WITH AXILLARY SENTINEL NODE BIOPSY Left 04/12/2014   Procedure: MASTECTOMY;  Surgeon: Imogene Burn. Georgette Dover, MD;  Location: [place];  Service: General;  Laterality: Left;   TISSUE EXPANDER PLACEMENT Left 04/12/2014   Procedure: TISSUE EXPANDER;  Surgeon: Irene Limbo, MD;  Location: [place];  Service: Plastics;  Laterality: Left;   TISSUE EXPANDER PLACEMENT Left 05/22/2014   Procedure: PLACEMENT OF TISSUE EXPANDER/I&D  WASHOUT SEROMA;  Surgeon: Irene Limbo, MD;  Location: [place];  Service: Plastics;  Laterality: Left;    There were no vitals filed for this visit.   Subjective Assessment - 07/04/20 1210    Subjective Chip pack is working well.  Still haven't heard about sleeve yet but want to make more appts until it comes    Patient is accompained by: Family member    Pertinent History mini stroke  2004, hx of L breast cancer with lumpectomy in 1998 and ALND- pt completed chemo and radiation in 1998, then she had a new cancer in her left breast in 2015 and underwent a mastectomy with lat flap reconstruction and has completed chemotherapy    Currently in Pain? No/denies    Pain Score 0-No pain                       [facility]patient [facility]  Lymphedema Life Impact Scale Total Score 61.76 %            [facility] Adult PT Treatment/Exercise - 07/04/20 0001      Manual Therapy   Manual Lymphatic Drainage (MLD) In Supine: Short neck, superficial and deep abdominals, Lt inguinal and Rt axillary nodes, Lt axillo-inguinal and anterior inter-axillary anastomsosis, then Lt UE: lateral upper arm, medial to lateral, and lateral upper arm again, ant/post foreram , and dorsal hand and fingers retracing all steps directing fluid towards pathways    Compression Bandaging Coconut oil applied to Lt UE then thick stockinette, Elastomull to all 5 fingers using 2 rolls (fixated with paper tape)and chip pack to dorsal hand, Artiflex x1 adding 1/4" gray compression foam to ant/post forearm, then 1-6 cm, 1-10 cm and 2-12 cm (first done in herring bone fashion) from hand to axilla                       PT Long Term Goals - 05/05/20 0849      PT LONG TERM GOAL #1   Title Pt will demonstrate a 6 cm reduction in swelling at wrist to decrease risk of cellulitis.    Baseline 27    Time 6    Period Weeks    Status New    Target Date 07/07/20      PT LONG TERM GOAL #2   Title Pt will demonstrate a 7 cm reduction in edema 15 cm proximal to ulnar styloid process to decresae risk of lymphedema.    Baseline 37.8    Time 6    Period Weeks    Status New    Target Date 07/07/20      PT LONG TERM GOAL #3   Title Pt will obtain appropriate compression garments for long term management of lymphedema.    Time 6    Period Weeks     Status New    Target Date 07/07/20      PT LONG TERM GOAL #4   Title Pt and/or spouse will be independent in long term managment of lymphedema to decrease risk of cellulitis.    Time 6    Period Weeks    Status New    Target Date 07/07/20                 Plan - 07/04/20 1214    Clinical Impression Statement Continued MLD and compression bandaging with chip pack which is doing well at dorsum of hand. Continue CDP until  new garments arrive.    Personal Factors and Comorbidities Time since onset of injury/illness/exacerbation    Stability/Clinical Decision Making Stable/Uncomplicated    Rehab Potential Good    Clinical Impairments Affecting Rehab Potential hx of radiation, long standing hx of lymphedema    PT Frequency 3x / week    PT Duration 8 weeks    PT Treatment/Interventions ADLs/Self Care Home Management;Therapeutic exercise;Therapeutic activities;Patient/family education;Manual techniques;Manual lymph drainage;Compression bandaging;Taping;Vasopneumatic Device;Passive range of motion;Scar mobilization    PT Next Visit Plan Assess chip pack for hand, Continue CDT until pt. gets her garments    PT Home Exercise Plan Perform remedial exercises while wearing bandages    Consulted and Agree with Plan of Care Patient;Family member/caregiver           Patient will benefit from skilled therapeutic intervention in order to improve the following deficits and impairments:  Pain, Postural dysfunction, Increased edema, Decreased knowledge of precautions  Visit Diagnosis: Postmastectomy lymphedema  Abnormal posture     Problem List Patient Active Problem List   Diagnosis Date Noted   Bunion 10/11/2018   Essential hypertension 10/09/2018   Depression    Hypokalemia 04/06/2016   Sepsis ([place]) 04/06/2016   Cellulitis of left hand 04/06/2016   Lymph edema    Chronic diastolic (congestive) heart failure ([place])    History of therapeutic radiation 01/14/2015    Recurrent breast cancer ([place]) 04/12/2014   Hepatic steatosis 02/01/2014   Malignant neoplasm of upper-outer quadrant of left breast in female, estrogen receptor positive ([place]) 10/12/2013   Stroke ([place]) 09/13/2002    Claris Pong, PT 07/04/2020, 12:24 PM  [place], [place], [address] Phone: [phone]   Fax:  [phone]  Name: Kelsey Mclaughlin MRN: [id number] Date of Birth: 07/16/1961

## 2020-07-10 DIAGNOSIS — M21612 Bunion of left foot: Secondary | ICD-10-CM | POA: Diagnosis not present

## 2020-07-10 DIAGNOSIS — M79671 Pain in right foot: Secondary | ICD-10-CM | POA: Diagnosis not present

## 2020-07-10 DIAGNOSIS — M79672 Pain in left foot: Secondary | ICD-10-CM | POA: Diagnosis not present

## 2020-07-10 DIAGNOSIS — M21611 Bunion of right foot: Secondary | ICD-10-CM | POA: Diagnosis not present

## 2020-07-11 ENCOUNTER — Other Ambulatory Visit (HOSPITAL_COMMUNITY): Payer: Self-pay | Admitting: Orthopedic Surgery

## 2020-07-25 ENCOUNTER — Encounter (HOSPITAL_BASED_OUTPATIENT_CLINIC_OR_DEPARTMENT_OTHER): Payer: Self-pay | Admitting: Orthopedic Surgery

## 2020-07-25 ENCOUNTER — Other Ambulatory Visit: Payer: Self-pay

## 2020-07-28 ENCOUNTER — Other Ambulatory Visit (HOSPITAL_COMMUNITY)
Admission: RE | Admit: 2020-07-28 | Discharge: 2020-07-28 | Disposition: A | Discharge status: Home / Self Care | Payer: Federal, State, Local not specified - PPO | Source: Ambulatory Visit | Attending: Orthopedic Surgery | Admitting: Orthopedic Surgery

## 2020-07-28 ENCOUNTER — Encounter (HOSPITAL_BASED_OUTPATIENT_CLINIC_OR_DEPARTMENT_OTHER)
Admission: RE | Admit: 2020-07-28 | Discharge: 2020-07-28 | Disposition: A | Discharge status: Home / Self Care | Payer: Federal, State, Local not specified - PPO | Source: Ambulatory Visit | Attending: Orthopedic Surgery | Admitting: Orthopedic Surgery

## 2020-07-28 DIAGNOSIS — M21612 Bunion of left foot: Secondary | ICD-10-CM | POA: Diagnosis not present

## 2020-07-28 DIAGNOSIS — Z01818 Encounter for other preprocedural examination: Secondary | ICD-10-CM | POA: Insufficient documentation

## 2020-07-28 DIAGNOSIS — Z20822 Contact with and (suspected) exposure to covid-19: Secondary | ICD-10-CM | POA: Insufficient documentation

## 2020-07-28 DIAGNOSIS — Z79899 Other long term (current) drug therapy: Secondary | ICD-10-CM | POA: Diagnosis not present

## 2020-07-28 DIAGNOSIS — Z01812 Encounter for preprocedural laboratory examination: Secondary | ICD-10-CM | POA: Insufficient documentation

## 2020-07-28 LAB — BASIC METABOLIC PANEL
Anion gap: 9 (ref 5–15)
BUN: 7 mg/dL (ref 6–20)
CO2: 28 mmol/L (ref 22–32)
Calcium: 9.4 mg/dL (ref 8.9–10.3)
Chloride: 105 mmol/L (ref 98–111)
Creatinine, Ser: 0.62 mg/dL (ref 0.44–1.00)
GFR, Estimated: >60 mL/min (ref >60)
Glucose, Bld: 96 mg/dL (ref 70–99)
Potassium: 4.4 mmol/L (ref 3.5–5.1)
Sodium: 142 mmol/L (ref 135–145)

## 2020-07-28 LAB — SARS CORONAVIRUS 2 (TAT 6-24 HRS): SARS Coronavirus 2: NEGATIVE

## 2020-07-28 NOTE — Progress Notes (Signed)

## 2020-07-31 ENCOUNTER — Other Ambulatory Visit: Payer: Self-pay

## 2020-07-31 ENCOUNTER — Ambulatory Visit (HOSPITAL_BASED_OUTPATIENT_CLINIC_OR_DEPARTMENT_OTHER): Payer: Federal, State, Local not specified - PPO | Admitting: Certified Registered"

## 2020-07-31 ENCOUNTER — Encounter (HOSPITAL_BASED_OUTPATIENT_CLINIC_OR_DEPARTMENT_OTHER): Payer: Self-pay | Admitting: Orthopedic Surgery

## 2020-07-31 ENCOUNTER — Encounter (HOSPITAL_BASED_OUTPATIENT_CLINIC_OR_DEPARTMENT_OTHER)
Admission: RE | Disposition: A | Discharge status: Home / Self Care | Payer: Self-pay | Source: Home / Self Care | Attending: Orthopedic Surgery

## 2020-07-31 ENCOUNTER — Ambulatory Visit (HOSPITAL_BASED_OUTPATIENT_CLINIC_OR_DEPARTMENT_OTHER)
Admission: RE | Admit: 2020-07-31 | Discharge: 2020-07-31 | Disposition: A | Discharge status: Home / Self Care | Payer: Federal, State, Local not specified - PPO | Attending: Orthopedic Surgery | Admitting: Orthopedic Surgery

## 2020-07-31 DIAGNOSIS — Z79899 Other long term (current) drug therapy: Secondary | ICD-10-CM | POA: Insufficient documentation

## 2020-07-31 DIAGNOSIS — Z20822 Contact with and (suspected) exposure to covid-19: Secondary | ICD-10-CM | POA: Diagnosis not present

## 2020-07-31 DIAGNOSIS — I110 Hypertensive heart disease with heart failure: Secondary | ICD-10-CM | POA: Diagnosis not present

## 2020-07-31 DIAGNOSIS — M21612 Bunion of left foot: Secondary | ICD-10-CM | POA: Insufficient documentation

## 2020-07-31 DIAGNOSIS — I5032 Chronic diastolic (congestive) heart failure: Secondary | ICD-10-CM | POA: Diagnosis not present

## 2020-07-31 DIAGNOSIS — G8918 Other acute postprocedural pain: Secondary | ICD-10-CM | POA: Diagnosis not present

## 2020-07-31 HISTORY — PX: BUNIONECTOMY: SHX129

## 2020-07-31 SURGERY — BUNIONECTOMY
Anesthesia: General | Site: Foot | Laterality: Left

## 2020-07-31 MED ORDER — OXYCODONE HCL 5 MG PO TABS
5.0000 mg | ORAL_TABLET | ORAL | 0 refills | Status: AC | PRN
Start: 2020-07-31 — End: 2020-08-05

## 2020-07-31 MED ORDER — PROPOFOL 10 MG/ML IV BOLUS
INTRAVENOUS | Status: DC | PRN
  Administered 2020-07-31: 150 mg via INTRAVENOUS

## 2020-07-31 MED ORDER — CEFAZOLIN SODIUM-DEXTROSE 2-4 GM/100ML-% IV SOLN
2.0000 g | INTRAVENOUS | Status: AC
  Administered 2020-07-31: 2 g via INTRAVENOUS

## 2020-07-31 MED ORDER — MEPERIDINE HCL 25 MG/ML IJ SOLN: 6.2500 mg | INTRAMUSCULAR | Status: DC | PRN

## 2020-07-31 MED ORDER — 0.9 % SODIUM CHLORIDE (POUR BTL) OPTIME
TOPICAL | Status: DC | PRN
  Administered 2020-07-31: 200 mL

## 2020-07-31 MED ORDER — LIDOCAINE HCL (CARDIAC) PF 100 MG/5ML IV SOSY
PREFILLED_SYRINGE | INTRAVENOUS | Status: DC | PRN
  Administered 2020-07-31: 30 mg via INTRAVENOUS

## 2020-07-31 MED ORDER — OXYCODONE HCL 5 MG/5ML PO SOLN: 5.0000 mg | Freq: Once | ORAL | Status: DC | PRN

## 2020-07-31 MED ORDER — SENNA 8.6 MG PO TABS: 2.0000 | ORAL_TABLET | Freq: Two times a day (BID) | ORAL | 0 refills | Status: DC

## 2020-07-31 MED ORDER — VANCOMYCIN HCL 500 MG IV SOLR
INTRAVENOUS | Status: AC
  Filled 2020-07-31: qty 500

## 2020-07-31 MED ORDER — VANCOMYCIN HCL 500 MG IV SOLR
INTRAVENOUS | Status: DC | PRN
  Administered 2020-07-31: 500 mg via TOPICAL

## 2020-07-31 MED ORDER — EPHEDRINE SULFATE 50 MG/ML IJ SOLN
INTRAMUSCULAR | Status: DC | PRN
  Administered 2020-07-31: 10 mg via INTRAVENOUS

## 2020-07-31 MED ORDER — DEXAMETHASONE SODIUM PHOSPHATE 10 MG/ML IJ SOLN
INTRAMUSCULAR | Status: DC | PRN
  Administered 2020-07-31: 4 mg via INTRAVENOUS

## 2020-07-31 MED ORDER — PROPOFOL 500 MG/50ML IV EMUL
INTRAVENOUS | Status: DC | PRN
  Administered 2020-07-31: 25 ug/kg/min via INTRAVENOUS

## 2020-07-31 MED ORDER — AMISULPRIDE (ANTIEMETIC) 5 MG/2ML IV SOLN: 10.0000 mg | Freq: Once | INTRAVENOUS | Status: DC | PRN

## 2020-07-31 MED ORDER — ONDANSETRON HCL 4 MG/2ML IJ SOLN
INTRAMUSCULAR | Status: DC | PRN
  Administered 2020-07-31: 4 mg via INTRAVENOUS

## 2020-07-31 MED ORDER — HYDROMORPHONE HCL 1 MG/ML IJ SOLN: 0.2500 mg | INTRAMUSCULAR | Status: DC | PRN

## 2020-07-31 MED ORDER — FENTANYL CITRATE (PF) 100 MCG/2ML IJ SOLN
100.0000 ug | Freq: Once | INTRAMUSCULAR | Status: AC
  Administered 2020-07-31: 100 ug via INTRAVENOUS

## 2020-07-31 MED ORDER — DOCUSATE SODIUM 100 MG PO CAPS: 100.0000 mg | ORAL_CAPSULE | Freq: Two times a day (BID) | ORAL | 0 refills | Status: DC

## 2020-07-31 MED ORDER — FENTANYL CITRATE (PF) 100 MCG/2ML IJ SOLN
INTRAMUSCULAR | Status: AC
  Filled 2020-07-31: qty 2

## 2020-07-31 MED ORDER — MIDAZOLAM HCL 2 MG/2ML IJ SOLN
2.0000 mg | Freq: Once | INTRAMUSCULAR | Status: AC
  Administered 2020-07-31: 2 mg via INTRAVENOUS

## 2020-07-31 MED ORDER — PROMETHAZINE HCL 25 MG/ML IJ SOLN: 6.2500 mg | INTRAMUSCULAR | Status: DC | PRN

## 2020-07-31 MED ORDER — LACTATED RINGERS IV SOLN: INTRAVENOUS | Status: DC

## 2020-07-31 MED ORDER — RIVAROXABAN 10 MG PO TABS: 10.0000 mg | ORAL_TABLET | Freq: Every day | ORAL | 0 refills | Status: DC

## 2020-07-31 MED ORDER — MIDAZOLAM HCL 2 MG/2ML IJ SOLN
INTRAMUSCULAR | Status: AC
  Filled 2020-07-31: qty 2

## 2020-07-31 MED ORDER — ROPIVACAINE HCL 5 MG/ML IJ SOLN
INTRAMUSCULAR | Status: DC | PRN
  Administered 2020-07-31: 30 mL via PERINEURAL

## 2020-07-31 MED ORDER — CEFAZOLIN SODIUM-DEXTROSE 2-4 GM/100ML-% IV SOLN
INTRAVENOUS | Status: AC
  Filled 2020-07-31: qty 100

## 2020-07-31 MED ORDER — OXYCODONE HCL 5 MG PO TABS: 5.0000 mg | ORAL_TABLET | Freq: Once | ORAL | Status: DC | PRN

## 2020-07-31 MED ORDER — SODIUM CHLORIDE 0.9 % IV SOLN: INTRAVENOUS | Status: DC

## 2020-07-31 SURGICAL SUPPLY — 78 items
APL PRP STRL LF DISP 70% ISPRP (MISCELLANEOUS) ×1
BANDAGE ESMARK 6X9 LF (GAUZE/BANDAGES/DRESSINGS) ×1 IMPLANT
BIT DRILL 2.4 AO COUPLING CANN (BIT) ×2 IMPLANT
BIT DRILL CANN 2.4 (BIT) ×2
BIT DRILL CANN MAX VPC 2.4 (BIT) ×1 IMPLANT
BIT DRILL PRFL CANNULTD 3.4MM (BIT) ×1 IMPLANT
BLADE AVERAGE 25X9 (BLADE) ×2 IMPLANT
BLADE MICRO SAGITTAL (BLADE) ×2 IMPLANT
BLADE OSC/SAG .038X5.5 CUT EDG (BLADE) IMPLANT
BLADE SURG 15 STRL LF DISP TIS (BLADE) ×3 IMPLANT
BLADE SURG 15 STRL SS (BLADE) ×6
BNDG CMPR 9X6 STRL LF SNTH (GAUZE/BANDAGES/DRESSINGS) ×1
BNDG COHESIVE 4X5 TAN STRL (GAUZE/BANDAGES/DRESSINGS) ×2 IMPLANT
BNDG COHESIVE 6X5 TAN STRL LF (GAUZE/BANDAGES/DRESSINGS) ×2 IMPLANT
BNDG ESMARK 6X9 LF (GAUZE/BANDAGES/DRESSINGS) ×2
CHLORAPREP W/TINT 26 (MISCELLANEOUS) ×2 IMPLANT
COVER BACK TABLE 60X90IN (DRAPES) ×2 IMPLANT
COVER WAND RF STERILE (DRAPES) IMPLANT
CUFF TOURN SGL QUICK 34 (TOURNIQUET CUFF) ×2
CUFF TRNQT CYL 34X4.125X (TOURNIQUET CUFF) ×1 IMPLANT
DECANTER SPIKE VIAL GLASS SM (MISCELLANEOUS) IMPLANT
DRAPE EXTREMITY T 121X128X90 (DISPOSABLE) ×2 IMPLANT
DRAPE OEC MINIVIEW 54X84 (DRAPES) ×2 IMPLANT
DRAPE U-SHAPE 47X51 STRL (DRAPES) ×2 IMPLANT
DRILL PROFILE CANNULATED 3.4MM (BIT) ×2
DRSG MEPITEL 4X7.2 (GAUZE/BANDAGES/DRESSINGS) ×2 IMPLANT
DRSG PAD ABDOMINAL 8X10 ST (GAUZE/BANDAGES/DRESSINGS) ×4 IMPLANT
ELECT REM PT RETURN 9FT ADLT (ELECTROSURGICAL) ×2
ELECTRODE REM PT RTRN 9FT ADLT (ELECTROSURGICAL) ×1 IMPLANT
GAUZE SPONGE 4X4 12PLY STRL (GAUZE/BANDAGES/DRESSINGS) ×2 IMPLANT
GLOVE BIO SURGEON STRL SZ8 (GLOVE) ×2 IMPLANT
GLOVE BIOGEL PI IND STRL 8 (GLOVE) ×2 IMPLANT
GLOVE BIOGEL PI INDICATOR 8 (GLOVE) ×2
GLOVE ECLIPSE 8.0 STRL XLNG CF (GLOVE) ×2 IMPLANT
GLOVE SURG SS PI 7.0 STRL IVOR (GLOVE) ×2 IMPLANT
GLOVE SURG SS PI 7.5 STRL IVOR (GLOVE) ×2 IMPLANT
GOWN STRL REUS W/ TWL LRG LVL3 (GOWN DISPOSABLE) ×1 IMPLANT
GOWN STRL REUS W/ TWL XL LVL3 (GOWN DISPOSABLE) ×2 IMPLANT
GOWN STRL REUS W/TWL LRG LVL3 (GOWN DISPOSABLE) ×2
GOWN STRL REUS W/TWL XL LVL3 (GOWN DISPOSABLE) ×4
K-WIRE COCR 1.1X105 (WIRE) ×2
K-WIRE TROC 1.25X150 (WIRE) ×4
KWIRE COCR 1.1X105 (WIRE) ×1 IMPLANT
KWIRE TROC 1.25X150 (WIRE) ×2 IMPLANT
NEEDLE HYPO 22GX1.5 SAFETY (NEEDLE) IMPLANT
PACK BASIN DAY SURGERY FS (CUSTOM PROCEDURE TRAY) ×2 IMPLANT
PAD CAST 3X4 CTTN HI CHSV (CAST SUPPLIES) ×1 IMPLANT
PAD CAST 4YDX4 CTTN HI CHSV (CAST SUPPLIES) ×1 IMPLANT
PADDING CAST ABS 4INX4YD NS (CAST SUPPLIES)
PADDING CAST ABS COTTON 4X4 ST (CAST SUPPLIES) IMPLANT
PADDING CAST COTTON 3X4 STRL (CAST SUPPLIES) ×2
PADDING CAST COTTON 4X4 STRL (CAST SUPPLIES) ×2
PADDING CAST COTTON 6X4 STRL (CAST SUPPLIES) ×2 IMPLANT
PENCIL SMOKE EVACUATOR (MISCELLANEOUS) ×2 IMPLANT
SANITIZER HAND PURELL 535ML FO (MISCELLANEOUS) ×2 IMPLANT
SCREW CANNULATED NS 4MMX36MM (Screw) ×2 IMPLANT
SCREW CANNULATED PT 4.0X42 (Screw) ×2 IMPLANT
SCREW CORTICAL 3.5X46MM (Screw) ×2 IMPLANT
SCREW VPC 3.4X26MM (Screw) ×1 IMPLANT
SHEET MEDIUM DRAPE 40X70 STRL (DRAPES) ×2 IMPLANT
SLEEVE SCD COMPRESS KNEE MED (MISCELLANEOUS) ×2 IMPLANT
SPLINT FAST PLASTER 5X30 (CAST SUPPLIES) ×20
SPLINT PLASTER CAST FAST 5X30 (CAST SUPPLIES) ×20 IMPLANT
SPONGE LAP 18X18 RF (DISPOSABLE) ×2 IMPLANT
STOCKINETTE 6  STRL (DRAPES) ×1
STOCKINETTE 6 STRL (DRAPES) ×1 IMPLANT
SUCTION FRAZIER HANDLE 10FR (MISCELLANEOUS) ×1
SUCTION TUBE FRAZIER 10FR DISP (MISCELLANEOUS) ×1 IMPLANT
SUT ETHILON 3 0 PS 1 (SUTURE) ×2 IMPLANT
SUT MNCRL AB 3-0 PS2 18 (SUTURE) ×2 IMPLANT
SUT VIC AB 0 SH 27 (SUTURE) IMPLANT
SUT VIC AB 2-0 SH 27 (SUTURE) ×2
SUT VIC AB 2-0 SH 27XBRD (SUTURE) ×1 IMPLANT
SYR BULB EAR ULCER 3OZ GRN STR (SYRINGE) ×2 IMPLANT
TOWEL GREEN STERILE FF (TOWEL DISPOSABLE) ×4 IMPLANT
TUBE CONNECTING 20X1/4 (TUBING) ×2 IMPLANT
UNDERPAD 30X36 HEAVY ABSORB (UNDERPADS AND DIAPERS) ×2 IMPLANT
VPC SCREW 3.4X26MM (Screw) ×2 IMPLANT

## 2020-07-31 NOTE — Op Note (Signed)
07/31/2020  23:53 AM  PATIENT:  Kelsey Mclaughlin  59 y.o. female  PRE-OPERATIVE DIAGNOSIS:  Left foot bunion  POST-OPERATIVE DIAGNOSIS:  Left foot bunion  Procedure(s): 1.  Left modified McBride bunionectomy 2.  Left Lapidus arthrodesis of the first TMT joint 3.  Left Akin osteotomy of the proximal phalanx 4.  AP, lateral and oblique radiographs of the left foot  SURGEON:  Wylene Simmer, MD  ASSISTANT: Mechele Claude, PA-C  ANESTHESIA:   General, regional  EBL:  minimal   TOURNIQUET: 44 minutes at 250 mmHg thigh  COMPLICATIONS:  None apparent  DISPOSITION:  Extubated, awake and stable to recovery.  INDICATION FOR PROCEDURE: The patient is a 59 year old female with a past medical history significant for breast cancer.  She has a prominent left foot bunion deformity that has been painful for many years.  She has instability of the first ray.  She has failed nonoperative treatment including activity modification, oral anti-inflammatories and shoewear modification.  She presents now for surgical correction of this painful forefoot deformity.  The risks and benefits of the alternative treatment options have been discussed in detail.  The patient wishes to proceed with surgery and specifically understands risks of bleeding, infection, nerve damage, blood clots, need for additional surgery, amputation and death.  PROCEDURE IN DETAIL:  After pre operative consent was obtained, and the correct operative site was identified, the patient was brought to the operating room and placed supine on the OR table.  Anesthesia was administered.  Pre-operative antibiotics were administered.  A surgical timeout was taken.  The left lower extremity was prepped and draped in standard sterile fashion with a tourniquet around the thigh.  The extremity was elevated and the tourniquet was inflated to 250 mmHg.  A longitudinal incision was made at the dorsum of the first webspace.  Dissection was carried down to  the subcutaneous tissues.  The intermetatarsal ligament was divided under direct vision.  An arthrotomy was made between the lateral sesamoid and the first metatarsal head.  Small perforations were made in the lateral joint capsule.  The hallux could then be positioned in 20 degrees of varus passively.  Attention was turned to the medial forefoot where a longitudinal incision was made.  Dissection was carried down through the subcutaneous tissues.  The medial joint capsule was incised and elevated dorsally and plantarly.  The hypertrophic medial eminence was resected in line with the first metatarsal shaft.  Attention was turned to the dorsum of the midfoot where a longitudinal incision was made over the first TMT joint.  Dissection was carried down through the subcutaneous tissues.  The joint capsule was elevated exposing the first TMT joint.  The oscillating saw was used to remove the remaining articular cartilage and subchondral bone from both sides of the joint.  The cuts were beveled to take more bone laterally than medially in order to correct the intermetatarsal angle.  The joint was reduced and provisionally pinned.  Radiographs showed appropriate correction of the IM angle.  The joint was then fixed with a 4 mm partially-threaded titanium cannulated screw from the [employer] 4 mm 5 mm cannulated screw set.  The joint was compressed appropriately.  Joint was further fixed with a 3.5 mm LPS screw.  AP and lateral radiographs confirmed appropriate position of both screws.  The forefoot was noted to have residual hallux valgus interphalangeus despite correction of the intermetatarsal and hallux valgus angles.  The decision was made to proceed with an Akin  osteotomy.  The distal incision was extended along the proximal phalanx of the hallux.  Dissection was carried down through the subcutaneous tissues.  The flexor and extensor tendons were protected.  A closing wedge osteotomy was made in the base of  the proximal phalanx.  The osteotomy was closed and fixed with a 3.4 mm [employer] VPC screw.  Radiographs confirmed appropriate correction of the hallux valgus and intermetatarsal angles.  There was persistent instability noted between the medial and middle cuneiforms.  The interval was reduced and a K wire inserted from the base of the first metatarsal into the base of the second metatarsal.  A cannulated partially threaded 4 mm screw was inserted.  This was tightened stabilizing the intercuneiform joint.  Final AP, oblique and lateral radiographs confirmed appropriate position and length of all hardware and appropriate correction of the bunion deformity.  The wounds were irrigated copiously.  Vancomycin powder were sprinkled in the wounds.  Medial joint capsule was repaired with 2-0 Vicryl.  Skin incisions were closed with nylon.  Sterile dressings were applied followed by a bunion wrap and a well-padded short leg splint.  The tourniquet was released after application of the dressings.  The patient was awakened from anesthesia and transported to the recovery room in stable condition.   FOLLOW UP PLAN: Nonweightbearing on the left lower extremity.  Xarelto for DVT prophylaxis due to her history of breast cancer and ongoing use of hormone therapy.  Follow-up in 2 weeks for suture removal and conversion to a short leg cast.   RADIOGRAPHS: AP, lateral and oblique radiographs of the left foot are obtained intraoperatively.  These show interval correction of the bunion deformity with appropriate position and length of all hardware.  No other acute injuries are noted.    Mechele Claude PA-C was present and scrubbed for the duration of the operative case. His assistance was essential in positioning the patient, prepping and draping, gaining and maintaining exposure, performing the operation, closing and dressing the wounds and applying the splint.

## 2020-07-31 NOTE — Anesthesia Postprocedure Evaluation (Signed)
Anesthesia Post Note  Patient: Kelsey Mclaughlin  Procedure(s) Performed: Left Lapidus and Modified McBride Bunionectomy (Left Foot)     Patient location during evaluation: PACU Anesthesia Type: General Level of consciousness: awake and alert Pain management: pain level controlled Vital Signs Assessment: post-procedure vital signs reviewed and stable Respiratory status: spontaneous breathing, nonlabored ventilation and respiratory function stable Cardiovascular status: blood pressure returned to baseline and stable Postop Assessment: no apparent nausea or vomiting Anesthetic complications: no   No complications documented.  Last Vitals:  Vitals:   07/31/20 1145 07/31/20 1200  BP:  (!) 159/80  Pulse: 78 81  Resp: 18 16  Temp:  36.6 C  SpO2: 98% 100%    Last Pain:  Vitals:   07/31/20 1200  TempSrc:   PainSc: 0-No pain                 Lynda Rainwater

## 2020-07-31 NOTE — Transfer of Care (Signed)
Immediate Anesthesia Transfer of Care Note  Patient: Kelsey Mclaughlin  Procedure(s) Performed: Left Lapidus and Modified McBride Bunionectomy (Left Foot)  Patient Location: PACU  Anesthesia Type:GA combined with regional for post-op pain  Level of Consciousness: drowsy and patient cooperative  Airway & Oxygen Therapy: Patient Spontanous Breathing and Patient connected to face mask oxygen  Post-op Assessment: Report given to RN and Post -op Vital signs reviewed and stable  Post vital signs: Reviewed and stable  Last Vitals:  Vitals Value Taken Time  BP 119/72 07/31/20 1100  Temp    Pulse 67 07/31/20 1101  Resp 15 07/31/20 1101  SpO2 100 % 07/31/20 1101  Vitals shown include unvalidated device data.  Last Pain:  Vitals:   07/31/20 0810  TempSrc: Oral  PainSc: 0-No pain         Complications: No complications documented.

## 2020-07-31 NOTE — H&P (Signed)
Kelsey Mclaughlin is an 59 y.o. female.   Chief Complaint:  L foot pain HPI: The patient is a 59 year old female with history of breast cancer.  She has a painful left foot bunion deformity.  She has failed nonoperative treatment to date including activity modification, oral anti-inflammatory and shoewear modification.  She presents now for surgical treatment.  Past Medical History:  Diagnosis Date  . Bronchitis    completed antibiotics 10/21/13/ states resolved  . Cancer ([place]) 1998, 2015   Left Breast  . Chronic diastolic (congestive) heart failure ([place])   . Hypertension   . Lymph edema    lt arm  . Stroke [facility]) 2004   TIA-  no problems since    Past Surgical History:  Procedure Laterality Date  . ABDOMINOPLASTY/PANNICULECTOMY    . BREAST IMPLANT EXCHANGE Left 09/03/2014   Procedure: LEFT REMOVAL TISSUE EXPANDERS WITH PLACEMENT OF SILICONE IMPLANT ;  Surgeon: Irene Limbo, MD;  Location: [place];  Service: Plastics;  Laterality: Left;  . BREAST RECONSTRUCTION Left 03/07/2015   Procedure: LEFT NIPPLE AEROLA CREATION WITH LOCAL FLAP/FULL THICKNESS SKIN GRAFT FROM GROIN;  Surgeon: Irene Limbo, MD;  Location: [place];  Service: Plastics;  Laterality: Left;  . BREAST SURGERY  1998   lumpectomy on left and removed lymphnodes in [place]  . HYDRADENITIS EXCISION Left 05/30/2018   Procedure: Adjacent tissue transfer left axilla;  Surgeon: Irene Limbo, MD;  Location: [place];  Service: Plastics;  Laterality: Left;  . LATISSIMUS FLAP TO BREAST Left 04/12/2014   Procedure: LEFT LATISSIMUS DORSI FLAP FOR LEFT BREAST RECONSTRUCTION AND PLACEMENT OF TISSUE EXPANDER;  Surgeon: Irene Limbo, MD;  Location: [place];  Service: Plastics;  Laterality: Left;  . LIPOSUCTION WITH LIPOFILLING Left 09/03/2014   Procedure:  LIPOFILLING TO LEFT CHEST  ;  Surgeon: Irene Limbo, MD;  Location: [place];  Service: Plastics;   Laterality: Left;  . LIPOSUCTION WITH LIPOFILLING Left 03/07/2015   Procedure: LIPOFILLING TO LEFT BREAST;  Surgeon: Irene Limbo, MD;  Location: [place];  Service: Plastics;  Laterality: Left;  Marland Kitchen MASTECTOMY Left 2015  . MASTOPEXY Right 09/03/2014   Procedure: RIGHT BREAST MASTOPEXY ;  Surgeon: Irene Limbo, MD;  Location: [place];  Service: Plastics;  Laterality: Right;  . PORT-A-CATH REMOVAL Right 12/24/2014   Procedure: REMOVAL PORT-A-CATH;  Surgeon: Donnie Mesa, MD;  Location: [place];  Service: General;  Laterality: Right;  . PORTACATH PLACEMENT Right 10/25/2013   Procedure: INSERTION PORT-A-CATH;  Surgeon: Imogene Burn. Georgette Dover, MD;  [facility];  Service: General;  Laterality: Right;  . REDUCTION MAMMAPLASTY Right   . SIMPLE MASTECTOMY WITH AXILLARY SENTINEL NODE BIOPSY Left 04/12/2014   Procedure: MASTECTOMY;  Surgeon: Imogene Burn. Georgette Dover, MD;  Location: [place];  Service: General;  Laterality: Left;  . TISSUE EXPANDER PLACEMENT Left 04/12/2014   Procedure: TISSUE EXPANDER;  Surgeon: Irene Limbo, MD;  Location: [place];  Service: Plastics;  Laterality: Left;  . TISSUE EXPANDER PLACEMENT Left 05/22/2014   Procedure: PLACEMENT OF TISSUE EXPANDER/I&D WASHOUT SEROMA;  Surgeon: Irene Limbo, MD;  Location: [place];  Service: Plastics;  Laterality: Left;    Family History  Problem Relation Age of Onset  . Breast cancer Mother 64  . Heart disease Father   . Heart attack Father   . Breast cancer Maternal Grandmother 82  . Brain cancer Maternal Uncle 74       benign brain  tumor  . Heart attack Paternal Aunt    Social History:  reports that she has never smoked. She has never used smokeless tobacco. She reports current alcohol use. She reports that she does not use drugs.  Allergies:  Allergies  Allergen Reactions  . Adhesive [Tape] Rash    Band-aids    Medications Prior to Admission  Medication Sig Dispense Refill  .  buPROPion (ZYBAN) 150 MG 12 hr tablet Take 150 mg by mouth every morning.    . cholecalciferol (VITAMIN D) 25 MCG (1000 UT) tablet TAKE 1 TABLET BY MOUTH EVERY DAY 100 tablet 4  . escitalopram (LEXAPRO) 20 MG tablet Take 20 mg by mouth daily.    Marland Kitchen losartan-hydrochlorothiazide (HYZAAR) 100-12.5 MG tablet     . anastrozole (ARIMIDEX) 1 MG tablet TAKE 1 TABLET BY MOUTH EVERY DAY (Patient not taking: Reported on 02/25/2020) 90 tablet 3  . desvenlafaxine (PRISTIQ) 50 MG 24 hr tablet Take 50 mg by mouth daily.      No results found for this or any previous visit (from the past 48 hour(s)). No results found.  Review of Systems no recent fever, chills, nausea, vomiting or changes in her appetite  Blood pressure 131/75, pulse 60, temperature 98.8 F (37.1 C), temperature source Oral, resp. rate 12, height 5\' 7"  (1.702 m), weight 84.6 kg, SpO2 100 %. Physical Exam  Well-nourished well-developed woman in no apparent distress.  Alert and oriented x4.  Normal mood and affect.  Gait is normal.  The left foot has a prominent bunion deformity.  Skin is healthy and intact.  Pulses are palpable.  No lymphadenopathy.  5 out of 5 strength in plantarflexion and dorsiflexion of the ankle and toes.  Assessment/Plan Painful left foot bunion deformity -to the operating room today for bunion correction with Lapidus first TMT joint arthrodesis and modified McBride bunionectomy.  The risks and benefits of the alternative treatment options have been discussed in detail.  The patient wishes to proceed with surgery and specifically understands risks of bleeding, infection, nerve damage, blood clots, need for additional surgery, amputation and death.   Wylene Simmer, MD 22-Aug-2020, 9:32 AM

## 2020-07-31 NOTE — Anesthesia Procedure Notes (Signed)
Anesthesia Regional Block: Popliteal block   Pre-Anesthetic Checklist: ,, timeout performed, Correct Patient, Correct Site, Correct Laterality, Correct Procedure, Correct Position, site marked, Risks and benefits discussed,  Surgical consent,  Pre-op evaluation,  At surgeon's request and post-op pain management  Laterality: Left  Prep: chloraprep       Needles:  Injection technique: Single-shot  Needle Type: Stimiplex     Needle Length: 9cm  Needle Gauge: 21     Additional Needles:   Procedures:,,,, ultrasound used (permanent image in chart),,,,  Narrative:  Start time: 07/31/2020 9:22 AM End time: 07/31/2020 9:27 AM Injection made incrementally with aspirations every 5 mL.  Performed by: Personally  Anesthesiologist: Lynda Rainwater, MD

## 2020-07-31 NOTE — Anesthesia Procedure Notes (Signed)
Procedure Name: LMA Insertion Date/Time: 07/31/2020 9:47 AM Performed by: Signe Colt, CRNA Pre-anesthesia Checklist: Patient identified, Emergency Drugs available, Suction available and Patient being monitored Patient Re-evaluated:Patient Re-evaluated prior to induction Oxygen Delivery Method: Circle System Utilized Preoxygenation: Pre-oxygenation with 100% oxygen Induction Type: IV induction Ventilation: Mask ventilation without difficulty LMA: LMA inserted LMA Size: 4.0 Number of attempts: 1 Airway Equipment and Method: bite block Placement Confirmation: positive ETCO2 Tube secured with: Tape Dental Injury: Teeth and Oropharynx as per pre-operative assessment

## 2020-07-31 NOTE — Anesthesia Preprocedure Evaluation (Signed)
Anesthesia Evaluation  Patient identified by MRN, date of birth, ID band Patient awake    Reviewed: Allergy & Precautions, NPO status , Patient's Chart, lab work & pertinent test results  Airway Mallampati: II  TM Distance: >3 FB Neck ROM: Full    Dental  (+) Teeth Intact   Pulmonary neg pulmonary ROS,    Pulmonary exam normal breath sounds clear to auscultation       Cardiovascular hypertension, Pt. on medications +CHF  Normal cardiovascular exam Rhythm:Regular Rate:Normal     Neuro/Psych PSYCHIATRIC DISORDERS Depression TIA   GI/Hepatic negative GI ROS, Neg liver ROS,   Endo/Other  Hx/o Left Breast Ca  Renal/GU negative Renal ROS  negative genitourinary   Musculoskeletal Scar contracture left axilla   Abdominal   Peds  Hematology negative hematology ROS (+)   Anesthesia Other Findings   Reproductive/Obstetrics                             Anesthesia Physical  Anesthesia Plan  ASA: III  Anesthesia Plan: General   Post-op Pain Management:  Regional for Post-op pain   Induction: Intravenous  PONV Risk Score and Plan: 3 and Ondansetron, Dexamethasone, Treatment may vary due to age or medical condition and Midazolam  Airway Management Planned: LMA  Additional Equipment:   Intra-op Plan:   Post-operative Plan: Extubation in OR  Informed Consent: I have reviewed the patients History and Physical, chart, labs and discussed the procedure including the risks, benefits and alternatives for the proposed anesthesia with the patient or authorized representative who has indicated his/her understanding and acceptance.     Dental advisory given  Plan Discussed with: CRNA and Surgeon  Anesthesia Plan Comments:         Anesthesia Quick Evaluation

## 2020-07-31 NOTE — Discharge Instructions (Addendum)
Wylene Simmer, MD [facility]  Please read the following information regarding your care after surgery.  Medications  You only need a prescription for the narcotic pain medicine (ex. oxycodone, Percocet, Norco).  All of the other medicines listed below are available over the counter. X Aleve 2 pills twice a day for the first 3 days after surgery. X acetominophen (Tylenol) 650 mg every 4-6 hours as you need for minor to moderate pain X oxycodone as prescribed for severe pain X Xarelto to prevent blood clots  Narcotic pain medicine (ex. oxycodone, Percocet, Vicodin) will cause constipation.  To prevent this problem, take the following medicines while you are taking any pain medicine. X docusate sodium (Colace) 100 mg twice a day X senna (Senokot) 2 tablets twice a day  X To help prevent blood clots, take Xarelto as prescribed two weeks after surgery.  You should also get up every hour while you are awake to move around.    Weight Bearing X Do not bear any weight on the operated leg or foot.  Cast / Splint / Dressing X Keep your splint, cast or dressing clean and dry.  Don't put anything (coat hanger, pencil, etc) down inside of it.  If it gets damp, use a hair dryer on the cool setting to dry it.  If it gets soaked, call the office to schedule an appointment for a cast change.   After your dressing, cast or splint is removed; you may shower, but do not soak or scrub the wound.  Allow the water to run over it, and then gently pat it dry.  Swelling It is normal for you to have swelling where you had surgery.  To reduce swelling and pain, keep your toes above your nose for at least 3 days after surgery.  It may be necessary to keep your foot or leg elevated for several weeks.  If it hurts, it should be elevated.  Follow Up Call my office at [phone] when you are discharged from the hospital or surgery center to schedule an appointment to be seen two weeks after surgery.  Call my office  at [phone] if you develop a fever >101.5 F, nausea, vomiting, bleeding from the surgical site or severe pain.     Regional Anesthesia Blocks  1. Numbness or the inability to move the "blocked" extremity may last from 3-48 hours after placement. The length of time depends on the medication injected and your individual response to the medication. If the numbness is not going away after 48 hours, call your surgeon.  2. The extremity that is blocked will need to be protected until the numbness is gone and the  Strength has returned. Because you cannot feel it, you will need to take extra care to avoid injury. Because it may be weak, you may have difficulty moving it or using it. You may not know what position it is in without looking at it while the block is in effect.  3. For blocks in the legs and feet, returning to weight bearing and walking needs to be done carefully. You will need to wait until the numbness is entirely gone and the strength has returned. You should be able to move your leg and foot normally before you try and bear weight or walk. You will need someone to be with you when you first try to ensure you do not fall and possibly risk injury.  4. Bruising and tenderness at the needle site are common side effects and will  resolve in a few days.  5. Persistent numbness or new problems with movement should be communicated to the surgeon or the [facility] [phone] [place] [phone]).

## 2020-07-31 NOTE — Progress Notes (Signed)
Assisted Dr. Miller with left, ultrasound guided, popliteal block. Side rails up, monitors on throughout procedure. See vital signs in flow sheet. Tolerated Procedure well. 

## 2020-08-01 NOTE — Addendum Note (Signed)
Addendum  created 08/01/20 1254 by Keonta Monceaux, Ernesta Amble, CRNA   Charge Capture section accepted

## 2020-08-03 ENCOUNTER — Encounter (HOSPITAL_BASED_OUTPATIENT_CLINIC_OR_DEPARTMENT_OTHER): Payer: Self-pay | Admitting: Orthopedic Surgery

## 2020-08-15 DIAGNOSIS — M21612 Bunion of left foot: Secondary | ICD-10-CM | POA: Diagnosis not present

## 2020-09-17 ENCOUNTER — Other Ambulatory Visit: Payer: Self-pay | Admitting: Oncology

## 2020-09-17 DIAGNOSIS — Z1231 Encounter for screening mammogram for malignant neoplasm of breast: Secondary | ICD-10-CM

## 2020-09-24 DIAGNOSIS — M21612 Bunion of left foot: Secondary | ICD-10-CM | POA: Diagnosis not present

## 2020-10-27 ENCOUNTER — Ambulatory Visit
Admission: RE | Admit: 2020-10-27 | Discharge: 2020-10-27 | Disposition: A | Discharge status: Home / Self Care | Payer: Federal, State, Local not specified - PPO | Source: Ambulatory Visit | Attending: Oncology | Admitting: Oncology

## 2020-10-27 ENCOUNTER — Other Ambulatory Visit: Payer: Self-pay

## 2020-10-27 DIAGNOSIS — Z1231 Encounter for screening mammogram for malignant neoplasm of breast: Secondary | ICD-10-CM | POA: Diagnosis not present

## 2021-01-09 DIAGNOSIS — I10 Essential (primary) hypertension: Secondary | ICD-10-CM | POA: Diagnosis not present

## 2021-05-06 ENCOUNTER — Other Ambulatory Visit: Payer: Self-pay | Admitting: *Deleted

## 2021-05-06 DIAGNOSIS — C50412 Malignant neoplasm of upper-outer quadrant of left female breast: Secondary | ICD-10-CM

## 2021-05-06 DIAGNOSIS — Z170 Estrogen receptor positive status [ER+]: Secondary | ICD-10-CM

## 2021-05-07 ENCOUNTER — Inpatient Hospital Stay: Payer: Federal, State, Local not specified - PPO | Attending: Oncology | Admitting: Oncology

## 2021-05-07 ENCOUNTER — Inpatient Hospital Stay: Payer: Federal, State, Local not specified - PPO

## 2021-05-07 ENCOUNTER — Other Ambulatory Visit: Payer: Self-pay

## 2021-05-07 VITALS — BP 130/60 | HR 68 | Temp 97.7°F | Resp 18 | Ht 67.0 in | Wt 189.6 lb

## 2021-05-07 DIAGNOSIS — Z9012 Acquired absence of left breast and nipple: Secondary | ICD-10-CM | POA: Diagnosis not present

## 2021-05-07 DIAGNOSIS — Z853 Personal history of malignant neoplasm of breast: Secondary | ICD-10-CM | POA: Insufficient documentation

## 2021-05-07 DIAGNOSIS — M8580 Other specified disorders of bone density and structure, unspecified site: Secondary | ICD-10-CM | POA: Diagnosis not present

## 2021-05-07 DIAGNOSIS — C50412 Malignant neoplasm of upper-outer quadrant of left female breast: Secondary | ICD-10-CM

## 2021-05-07 DIAGNOSIS — Z170 Estrogen receptor positive status [ER+]: Secondary | ICD-10-CM

## 2021-05-07 DIAGNOSIS — C50919 Malignant neoplasm of unspecified site of unspecified female breast: Secondary | ICD-10-CM

## 2021-05-07 DIAGNOSIS — I890 Lymphedema, not elsewhere classified: Secondary | ICD-10-CM | POA: Diagnosis not present

## 2021-05-07 DIAGNOSIS — Z9221 Personal history of antineoplastic chemotherapy: Secondary | ICD-10-CM | POA: Insufficient documentation

## 2021-05-07 LAB — CMP (CANCER CENTER ONLY)
ALT: 53 U/L — ABNORMAL HIGH (ref 0–44)
AST: 31 U/L (ref 15–41)
Albumin: 3.7 g/dL (ref 3.5–5.0)
Alkaline Phosphatase: 71 U/L (ref 38–126)
Anion gap: 8 (ref 5–15)
BUN: 13 mg/dL (ref 6–20)
CO2: 25 mmol/L (ref 22–32)
Calcium: 9.2 mg/dL (ref 8.9–10.3)
Chloride: 107 mmol/L (ref 98–111)
Creatinine: 0.69 mg/dL (ref 0.44–1.00)
GFR, Estimated: >60 mL/min (ref >60)
Glucose, Bld: 104 mg/dL — ABNORMAL HIGH (ref 70–99)
Potassium: 3.6 mmol/L (ref 3.5–5.1)
Sodium: 140 mmol/L (ref 135–145)
Total Bilirubin: <0.2 mg/dL — ABNORMAL LOW (ref 0.3–1.2)
Total Protein: 6.8 g/dL (ref 6.5–8.1)

## 2021-05-07 LAB — CBC WITH DIFFERENTIAL (CANCER CENTER ONLY)
Abs Immature Granulocytes: 0.05 10*3/uL (ref 0.00–0.07)
Basophils Absolute: 0.1 10*3/uL (ref 0.0–0.1)
Basophils Relative: 1 %
Eosinophils Absolute: 0.3 10*3/uL (ref 0.0–0.5)
Eosinophils Relative: 3 %
HCT: 37.9 % (ref 36.0–46.0)
Hemoglobin: 12.8 g/dL (ref 12.0–15.0)
Immature Granulocytes: 1 %
Lymphocytes Relative: 19 %
Lymphs Abs: 1.7 10*3/uL (ref 0.7–4.0)
MCH: 30.8 pg (ref 26.0–34.0)
MCHC: 33.8 g/dL (ref 30.0–36.0)
MCV: 91.1 fL (ref 80.0–100.0)
Monocytes Absolute: 0.7 10*3/uL (ref 0.1–1.0)
Monocytes Relative: 8 %
Neutro Abs: 6.3 10*3/uL (ref 1.7–7.7)
Neutrophils Relative %: 68 %
Platelet Count: 265 10*3/uL (ref 150–400)
RBC: 4.16 MIL/uL (ref 3.87–5.11)
RDW: 13.2 % (ref 11.5–15.5)
WBC Count: 9.1 10*3/uL (ref 4.0–10.5)
nRBC: 0 % (ref 0.0–0.2)

## 2021-05-07 NOTE — Progress Notes (Signed)
 Cancer Center  [phone] [phone]     ID: Kelsey Mclaughlin OB: 07/02/1961  MR#: [id number]  CSN#:[id number]  Patient Care Team: Sun, Vyvyan, MD as PCP - General (Family Medicine) Neustadt, Philip M, MD as Referring Physician (Family Medicine) Thimmappa, Brinda, MD as Consulting Physician (Plastic Surgery) Magrinat, Gustav C, MD as Consulting Physician (Oncology) Dorn, Henry H, MD as Referring Physician (Obstetrics and Gynecology) OTHER MD: Valerie Leschber MD, Rupinder Kaur MD   CHIEF COMPLAINT: Breast cancer, recurrent, estrogen receptor positive  CURRENT TREATMENT: observation   INTERVAL HISTORY: Kelsey Mclaughlin returns today for follow-up of her estrogen receptor positive breast cancer. She is accompanied by her spouse Kelsey Mclaughlin.  She continues on anastrozole.  She tolerates this well.  Hot flashes are not currently a major issue.  Kelsey Mclaughlin's last bone density screening on 11/24/2017, showed a T-score of -1.6, which is considered osteopenic.    Since her last visit, she underwent restaging CT chest, abdomen, pelvis on 05/13/2020 showing no evidence of recurrent or metastatic disease. There were no findings to explain her upper extremity lymphedema.  She also underwent right screening mammography with tomography at [facility] on 10/27/2020 showing: breast density category B; no evidence of malignancy.    REVIEW OF SYSTEMS: Kelsey Mclaughlin is doing "well".  They have 5 dogs and 4 horses as well as multiple other farm animals and this keeps him busy.  She and Kelsey Mclaughlin are trying a keto diet and are managing to lose some weight.  She has chronic left lymphedema which is more a nuisance than a handicap she says.  Aside from these issues a detailed review of systems today was stable   COVID 19 VACCINATION STATUS: fully vaccinated +1 booster   BREAST CANCER HISTORY: From doctor [name]'s intake note 01/30/2014:  "Kelsey Mclaughlin is a 60 y.o. female. In  1998 at the age of 35 was diagnosed with left breast cancer in [place]. This was an invasive carcinoma grade 2 stage II. At that time she underwent a lumpectomy with excellent lymph node dissection. She received 6 months of chemotherapy. [name] She also had radiation therapy to the left breast. 2011 patient noticed a lump in the outside of her left breast. She had ultrasound workup performed and was told it was negative no biopsies were performed. General 2014 after patient moved to the [place] she had a mammogram performed this revealed a possible mass in the outer quadrant of the left breast. Ultrasound showed it to be 1.7 cm. MRI of the breasts performed on January 30 revealed the mass to be 1.5 x 2.2 x 1.5 cm. Because of this she also had a biopsy performed on 10/04/2013. The biopsy revealed [[id number]) an invasive ductal carcinoma with ductal carcinoma in situ grade 2. Prognostic panel was positive for estrogen receptor 100% megestrol receptor +33% proliferation marker Ki-67 15% and the tumor was HER-2/neu positive" [with a signals ratio of 3.1 and number per cell 6.3]  The patient's subsequent history is as detailed below   PAST MEDICAL HISTORY: Past Medical History:  Diagnosis Date   Bronchitis    completed antibiotics 10/21/13/ states resolved   Cancer (HCC) 1998, 2015   Left Breast   Chronic diastolic (congestive) heart failure (HCC)    Hypertension    Lymph edema    lt arm   Stroke (HCC) 2004   TIA-  no problems since      PAST SURGICAL HISTORY: Past Surgical History:  Procedure Laterality Date   ABDOMINOPLASTY/PANNICULECTOMY     BREAST IMPLANT EXCHANGE Left 09/03/2014   Procedure: LEFT REMOVAL TISSUE EXPANDERS WITH PLACEMENT OF SILICONE IMPLANT ;  Surgeon: Brinda Thimmappa, MD;  Location: [facility];  Service: Plastics;  Laterality: Left;   BREAST RECONSTRUCTION Left 03/07/2015   Procedure: LEFT  NIPPLE AEROLA CREATION WITH LOCAL FLAP/FULL THICKNESS SKIN GRAFT FROM GROIN;  Surgeon: Brinda Thimmappa, MD;  Location: [facility];  Service: Plastics;  Laterality: Left;   BREAST SURGERY  1998   lumpectomy on left and removed lymphnodes in [place]   BUNIONECTOMY Left 07/31/2020   Procedure: Left Lapidus and Modified McBride Bunionectomy;  Surgeon: Hewitt, John, MD;  Location: [facility];  Service: Orthopedics;  Laterality: Left;   HYDRADENITIS EXCISION Left 05/30/2018   Procedure: Adjacent tissue transfer left axilla;  Surgeon: Thimmappa, Brinda, MD;  Location: [facility];  Service: Plastics;  Laterality: Left;   LATISSIMUS FLAP TO BREAST Left 04/12/2014   Procedure: LEFT LATISSIMUS DORSI FLAP FOR LEFT BREAST RECONSTRUCTION AND PLACEMENT OF TISSUE EXPANDER;  Surgeon: Brinda Thimmappa, MD;  Location: [facility];  Service: Plastics;  Laterality: Left;   LIPOSUCTION WITH LIPOFILLING Left 09/03/2014   Procedure:  LIPOFILLING TO LEFT CHEST  ;  Surgeon: Brinda Thimmappa, MD;  Location: [facility];  Service: Plastics;  Laterality: Left;   LIPOSUCTION WITH LIPOFILLING Left 03/07/2015   Procedure: LIPOFILLING TO LEFT BREAST;  Surgeon: Brinda Thimmappa, MD;  Location: [facility];  Service: Plastics;  Laterality: Left;   MASTECTOMY Left 2015   MASTOPEXY Right 09/03/2014   Procedure: RIGHT BREAST MASTOPEXY ;  Surgeon: Brinda Thimmappa, MD;  Location: [facility];  Service: Plastics;  Laterality: Right;   PORT-A-CATH REMOVAL Right 12/24/2014   Procedure: REMOVAL PORT-A-CATH;  Surgeon: Matthew Tsuei, MD;  Location: [facility];  Service: General;  Laterality: Right;   PORTACATH PLACEMENT Right 10/25/2013   Procedure: INSERTION PORT-A-CATH;  Surgeon: Matthew K. Tsuei, MD;  Location: [facility];  Service: General;  Laterality: Right;   REDUCTION MAMMAPLASTY Right    SIMPLE MASTECTOMY WITH AXILLARY SENTINEL NODE BIOPSY Left  04/12/2014   Procedure: MASTECTOMY;  Surgeon: Matthew K. Tsuei, MD;  Location: [facility];  Service: General;  Laterality: Left;   TISSUE EXPANDER PLACEMENT Left 04/12/2014   Procedure: TISSUE EXPANDER;  Surgeon: Brinda Thimmappa, MD;  Location: [facility];  Service: Plastics;  Laterality: Left;   TISSUE EXPANDER PLACEMENT Left 05/22/2014   Procedure: PLACEMENT OF TISSUE EXPANDER/I&D WASHOUT SEROMA;  Surgeon: Brinda Thimmappa, MD;  Location: [facility];  Service: Plastics;  Laterality: Left;    FAMILY HISTORY Family History  Problem Relation Age of Onset   Breast cancer Mother 64   Heart disease Father    Heart attack Father    Breast cancer Maternal Grandmother 98   Brain cancer Maternal Uncle 74       benign brain tumor   Heart attack Paternal Aunt     GYNECOLOGIC HISTORY:   menarche age 12, first live birth age 18. The patient is GX P2. She went through the change of life approximately 2011.    SOCIAL HISTORY:  The patient worked previously as a nurse. She has 2 children both of whom live in [place] -- Peter, works for the [employer], and Thomas, a technology designer. There are no grandchildren. The patient married Kelsey Mclaughlin (both women are originally from [place]) in [place] 2011.   [place] 5 dogs, 40+ chickens and 4 horses. They're not church attenders    ADVANCED DIRECTIVES: in place   HEALTH MAINTENANCE: Social History   Tobacco Use   Smoking status: Never   Smokeless tobacco: Never  Substance Use Topics   Alcohol use: Yes    Comment: 2 drinks/day   Drug use: No     Colonoscopy:  PAP: May 2017    Bone density:  11/24/2017 showed a T score of  -1.6 osteopenia  Lipid panel:  Allergies  Allergen Reactions   Adhesive [Tape] Rash    Band-aids    Current Outpatient Medications  Medication Sig Dispense Refill   cholecalciferol (VITAMIN D) 25 MCG (1000 UT) tablet TAKE 1 TABLET BY MOUTH EVERY DAY 100 tablet 4   escitalopram (LEXAPRO) 20 MG tablet  Take 20 mg by mouth daily.     losartan-hydrochlorothiazide (HYZAAR) 100-12.5 MG tablet      No current facility-administered medications for this visit.    OBJECTIVE: White woman who appears younger than stated age  60:   05/07/21 1358  BP: 130/60  Pulse: 68  Resp: 18  Temp: 97.7 F (36.5 C)  SpO2: 98%      Body mass index is 29.7 kg/m.    Filed Weights   05/07/21 1358  Weight: 189 lb 9.6 oz (86 kg)    ECOG FS:1 - Symptomatic but completely ambulatory   Sclerae unicteric, EOMs intact Wearing a mask No cervical or supraclavicular adenopathy Lungs no rales or rhonchi Heart regular rate and rhythm Abd soft, nontender, positive bowel sounds MSK no focal spinal tenderness, chronic left upper extremity lymphedema with compression sleeve in place Neuro: nonfocal, well oriented, appropriate affect Breasts: The right breast is status post reduction mammoplasty.  It is otherwise unremarkable.  The left breast with her postmastectomy with reconstruction.  There is no evidence of local recurrence.  Both axillae are benign   LAB RESULTS:  CMP     Component Value Date/Time   NA 140 05/07/2021 1349   NA 140 02/16/2017 1323   K 3.6 05/07/2021 1349   K 3.7 02/16/2017 1323   CL 107 05/07/2021 1349   CO2 25 05/07/2021 1349   CO2 26 02/16/2017 1323   GLUCOSE 104 (H) 05/07/2021 1349   GLUCOSE 113 02/16/2017 1323   BUN 13 05/07/2021 1349   BUN 12.8 02/16/2017 1323   CREATININE 0.69 05/07/2021 1349   CREATININE 0.8 02/16/2017 1323   CALCIUM 9.2 05/07/2021 1349   CALCIUM 9.7 02/16/2017 1323   PROT 6.8 05/07/2021 1349   PROT 6.9 02/16/2017 1323   ALBUMIN 3.7 05/07/2021 1349   ALBUMIN 4.1 02/16/2017 1323   AST 31 05/07/2021 1349   AST 34 02/16/2017 1323   ALT 53 (H) 05/07/2021 1349   ALT 58 (H) 02/16/2017 1323   ALKPHOS 71 05/07/2021 1349   ALKPHOS 107 02/16/2017 1323   BILITOT <0.2 (L) 05/07/2021 1349   BILITOT 0.28 02/16/2017 1323   GFRNONAA >60 05/07/2021 1349    GFRAA >60 05/01/2020 1353    I No results found for: SPEP  Lab Results  Component Value Date   WBC 9.1 05/07/2021   NEUTROABS 6.3 05/07/2021   HGB 12.8 05/07/2021   HCT 37.9 05/07/2021   MCV 91.1 05/07/2021   PLT 265 05/07/2021      Chemistry      Component Value Date/Time   NA 140 05/07/2021 1349   NA 140 02/16/2017 1323  K 3.6 05/07/2021 1349   K 3.7 02/16/2017 1323   CL 107 05/07/2021 1349   CO2 25 05/07/2021 1349   CO2 26 02/16/2017 1323   BUN 13 05/07/2021 1349   BUN 12.8 02/16/2017 1323   CREATININE 0.69 05/07/2021 1349   CREATININE 0.8 02/16/2017 1323      Component Value Date/Time   CALCIUM 9.2 05/07/2021 1349   CALCIUM 9.7 02/16/2017 1323   ALKPHOS 71 05/07/2021 1349   ALKPHOS 107 02/16/2017 1323   AST 31 05/07/2021 1349   AST 34 02/16/2017 1323   ALT 53 (H) 05/07/2021 1349   ALT 58 (H) 02/16/2017 1323   BILITOT <0.2 (L) 05/07/2021 1349   BILITOT 0.28 02/16/2017 1323       No results found for: LABCA2  No components found for: LABCA125  No results for input(s): INR in the last 168 hours.  Urinalysis    Component Value Date/Time   COLORURINE YELLOW 04/05/2016 2040   APPEARANCEUR CLEAR 04/05/2016 2040   LABSPEC 1.027 04/05/2016 2040   LABSPEC 1.005 05/22/2014 1130   PHURINE 7.0 04/05/2016 2040   GLUCOSEU NEGATIVE 04/05/2016 2040   GLUCOSEU Negative 05/22/2014 1130   HGBUR NEGATIVE 04/05/2016 2040   BILIRUBINUR NEGATIVE 04/05/2016 2040   BILIRUBINUR Negative 05/22/2014 1130   KETONESUR NEGATIVE 04/05/2016 2040   PROTEINUR 100 (A) 04/05/2016 2040   UROBILINOGEN 0.2 05/22/2014 1130   NITRITE NEGATIVE 04/05/2016 2040   LEUKOCYTESUR NEGATIVE 04/05/2016 2040   LEUKOCYTESUR Negative 05/22/2014 1130    STUDIES: No results found.   ASSESSMENT: 59 y.o. BRCA negative [place] woman, [place] speaker   (1) status post left lumpectomy and axillary lymph node dissection in 1998 in [place] for a stage II invasive ductal carcinoma, grade 2,  treated with adjuvant chemotherapy (specific drugs not clear) and adjuvant radiation.  (2) status post left breast upper outer quadrant biopsy 10/04/2013 for a clinically mT2 N0, stage IIA invasive ductal carcinoma, grade 2, estrogen receptor 100% positive, progesterone receptor 33% positive, with an MIB-1 of 15%, and HER-2 amplified; staging studies showed no evidence of metastatic disease  (3) started on neoadjuvant carboplatin/ docetaxel/ trastuzumab/ pertuzumab 11/12/2013  (a) completed 4 cycles as of 05/06/201, after which docetaxel was discontinued because of neuropathy symptoms   (b) gemcitabine was given day 1 and day 8 with carboplatin day 1 in the final 2 cycles, completed 03/04/2014   (c)  completed a year of trastuzumab March 2016--  Final echo 10/21/2014 shows a normal EF  (4) left mastectomy  04/12/2014 showed residual mypT1c ypNX invasive ductal carcinoma, grade 2, estrogen and progesterone receptor positive, HER-2 negative, with negative margins  (5) anastrozole started 07/14/2014, completing five years November 2020  (a) bone density 07/29/2014 shows T score - 1.2 (mild osteopenia)  (b) bone density 11/24/2017 showed a T score of  -1.6 osteopenia  (6) 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   PLAN: Kelsey Mclaughlin is now just over 7 years out from definitive surgery for her breast cancer with no evidence of disease recurrence.  This is very favorable.  I would not be uncomfortable releasing her from follow-up but she and Kelsey Mclaughlin both derive significant benefit, they tell me, from being followed here as well as through her other physicians.  Accordingly I have made her a return appointment with us in 1 year.  I suggested there are different more fashionable compression sleeves she might consider and I gave her information on the lymphediva brand.  Total   encounter time 25 minutes.   Magrinat, Gustav C, MD  05/07/21 6:22 PM Medical Oncology  and Hematology [facility] [address] [place], [place] [address] Tel. [phone]    Fax. [phone]   I, Katie Daubenspeck, am acting as scribe for Dr. Gustav C. Magrinat.  I, Gustav Magrinat MD, have reviewed the above documentation for accuracy and completeness, and I agree with the above.   *Total Encounter Time as defined by the Centers for Medicare and Medicaid Services includes, in addition to the face-to-face time of a patient visit (documented in the note above) non-face-to-face time: obtaining and reviewing outside history, ordering and reviewing medications, tests or procedures, care coordination (communications with other health care professionals or caregivers) and documentation in the medical record.  

## 2021-05-08 DIAGNOSIS — F3342 Major depressive disorder, recurrent, in full remission: Secondary | ICD-10-CM | POA: Diagnosis not present

## 2021-11-20 ENCOUNTER — Other Ambulatory Visit: Payer: Self-pay | Admitting: Family Medicine

## 2021-11-20 DIAGNOSIS — Z1231 Encounter for screening mammogram for malignant neoplasm of breast: Secondary | ICD-10-CM

## 2021-11-24 ENCOUNTER — Ambulatory Visit
Admission: RE | Admit: 2021-11-24 | Discharge: 2021-11-24 | Disposition: A | Discharge status: Home / Self Care | Payer: Federal, State, Local not specified - PPO | Source: Ambulatory Visit | Attending: Family Medicine | Admitting: Family Medicine

## 2021-11-24 ENCOUNTER — Other Ambulatory Visit: Payer: Self-pay

## 2021-11-24 DIAGNOSIS — Z1231 Encounter for screening mammogram for malignant neoplasm of breast: Secondary | ICD-10-CM

## 2022-02-23 ENCOUNTER — Telehealth: Payer: Self-pay | Admitting: Hematology and Oncology

## 2022-02-23 NOTE — Telephone Encounter (Signed)
Rescheduled appointment per providers. Patient aware.  ? ?

## 2022-02-24 DIAGNOSIS — I10 Essential (primary) hypertension: Secondary | ICD-10-CM | POA: Diagnosis not present

## 2022-02-24 DIAGNOSIS — I890 Lymphedema, not elsewhere classified: Secondary | ICD-10-CM | POA: Diagnosis not present

## 2022-02-24 DIAGNOSIS — H9312 Tinnitus, left ear: Secondary | ICD-10-CM | POA: Diagnosis not present

## 2022-04-19 DIAGNOSIS — F3342 Major depressive disorder, recurrent, in full remission: Secondary | ICD-10-CM | POA: Diagnosis not present

## 2022-04-29 ENCOUNTER — Telehealth: Payer: Self-pay | Admitting: Hematology and Oncology

## 2022-04-29 NOTE — Telephone Encounter (Signed)
Contacted patient to scheduled appointments. Patient is aware of appointments that are scheduled.   

## 2022-05-06 ENCOUNTER — Other Ambulatory Visit: Payer: Federal, State, Local not specified - PPO

## 2022-05-06 ENCOUNTER — Ambulatory Visit: Payer: Federal, State, Local not specified - PPO | Admitting: Hematology and Oncology

## 2022-05-07 ENCOUNTER — Ambulatory Visit: Payer: Federal, State, Local not specified - PPO | Admitting: Hematology and Oncology

## 2022-05-07 ENCOUNTER — Other Ambulatory Visit: Payer: Federal, State, Local not specified - PPO

## 2022-06-01 ENCOUNTER — Other Ambulatory Visit: Payer: Self-pay

## 2022-06-01 DIAGNOSIS — C50412 Malignant neoplasm of upper-outer quadrant of left female breast: Secondary | ICD-10-CM

## 2022-06-01 DIAGNOSIS — E876 Hypokalemia: Secondary | ICD-10-CM

## 2022-06-01 DIAGNOSIS — I5032 Chronic diastolic (congestive) heart failure: Secondary | ICD-10-CM

## 2022-06-01 DIAGNOSIS — C50919 Malignant neoplasm of unspecified site of unspecified female breast: Secondary | ICD-10-CM

## 2022-06-02 ENCOUNTER — Inpatient Hospital Stay: Payer: Federal, State, Local not specified - PPO | Attending: Hematology and Oncology

## 2022-06-02 ENCOUNTER — Inpatient Hospital Stay: Payer: Federal, State, Local not specified - PPO | Admitting: Hematology and Oncology

## 2022-06-02 ENCOUNTER — Other Ambulatory Visit: Payer: Self-pay

## 2022-06-02 ENCOUNTER — Encounter: Payer: Self-pay | Admitting: Hematology and Oncology

## 2022-06-02 VITALS — BP 126/72 | HR 64 | Temp 97.7°F | Resp 16 | Ht 67.0 in | Wt 191.2 lb

## 2022-06-02 DIAGNOSIS — C50919 Malignant neoplasm of unspecified site of unspecified female breast: Secondary | ICD-10-CM

## 2022-06-02 DIAGNOSIS — Z79899 Other long term (current) drug therapy: Secondary | ICD-10-CM | POA: Diagnosis not present

## 2022-06-02 DIAGNOSIS — Z9221 Personal history of antineoplastic chemotherapy: Secondary | ICD-10-CM | POA: Insufficient documentation

## 2022-06-02 DIAGNOSIS — Z170 Estrogen receptor positive status [ER+]: Secondary | ICD-10-CM | POA: Diagnosis not present

## 2022-06-02 DIAGNOSIS — C50412 Malignant neoplasm of upper-outer quadrant of left female breast: Secondary | ICD-10-CM

## 2022-06-02 DIAGNOSIS — E876 Hypokalemia: Secondary | ICD-10-CM

## 2022-06-02 DIAGNOSIS — M8580 Other specified disorders of bone density and structure, unspecified site: Secondary | ICD-10-CM | POA: Diagnosis not present

## 2022-06-02 DIAGNOSIS — Z853 Personal history of malignant neoplasm of breast: Secondary | ICD-10-CM | POA: Diagnosis not present

## 2022-06-02 DIAGNOSIS — Z9012 Acquired absence of left breast and nipple: Secondary | ICD-10-CM | POA: Diagnosis not present

## 2022-06-02 DIAGNOSIS — Z923 Personal history of irradiation: Secondary | ICD-10-CM | POA: Insufficient documentation

## 2022-06-02 LAB — CBC WITH DIFFERENTIAL (CANCER CENTER ONLY)
Abs Immature Granulocytes: 0.03 10*3/uL (ref 0.00–0.07)
Basophils Absolute: 0.1 10*3/uL (ref 0.0–0.1)
Basophils Relative: 1 %
Eosinophils Absolute: 0.3 10*3/uL (ref 0.0–0.5)
Eosinophils Relative: 4 %
HCT: 39.3 % (ref 36.0–46.0)
Hemoglobin: 13.4 g/dL (ref 12.0–15.0)
Immature Granulocytes: 0 %
Lymphocytes Relative: 20 %
Lymphs Abs: 1.7 10*3/uL (ref 0.7–4.0)
MCH: 30.7 pg (ref 26.0–34.0)
MCHC: 34.1 g/dL (ref 30.0–36.0)
MCV: 89.9 fL (ref 80.0–100.0)
Monocytes Absolute: 0.8 10*3/uL (ref 0.1–1.0)
Monocytes Relative: 10 %
Neutro Abs: 5.3 10*3/uL (ref 1.7–7.7)
Neutrophils Relative %: 65 %
Platelet Count: 300 10*3/uL (ref 150–400)
RBC: 4.37 MIL/uL (ref 3.87–5.11)
RDW: 12.4 % (ref 11.5–15.5)
WBC Count: 8.1 10*3/uL (ref 4.0–10.5)
nRBC: 0 % (ref 0.0–0.2)

## 2022-06-02 LAB — CMP (CANCER CENTER ONLY)
ALT: 51 U/L — ABNORMAL HIGH (ref 0–44)
AST: 31 U/L (ref 15–41)
Albumin: 4.3 g/dL (ref 3.5–5.0)
Alkaline Phosphatase: 67 U/L (ref 38–126)
Anion gap: 8 (ref 5–15)
BUN: 15 mg/dL (ref 6–20)
CO2: 24 mmol/L (ref 22–32)
Calcium: 9.3 mg/dL (ref 8.9–10.3)
Chloride: 108 mmol/L (ref 98–111)
Creatinine: 0.72 mg/dL (ref 0.44–1.00)
GFR, Estimated: >60 mL/min (ref >60)
Glucose, Bld: 95 mg/dL (ref 70–99)
Potassium: 3.8 mmol/L (ref 3.5–5.1)
Sodium: 140 mmol/L (ref 135–145)
Total Bilirubin: 0.3 mg/dL (ref 0.3–1.2)
Total Protein: 7.1 g/dL (ref 6.5–8.1)

## 2022-06-02 NOTE — Progress Notes (Signed)
[place]  [phone] [phone]     ID: Kelsey Mclaughlin Mastriaco OB: Jun 24, 1961  MR#: [id number]  NUU#:[id number]  Patient Care Team: Donald Prose, MD as PCP - General (Family Medicine) [name], MD as Referring Physician (Family Medicine) Irene Limbo, MD as Consulting Physician (Plastic Surgery) Magrinat, Virgie Dad, MD (Inactive) as Consulting Physician (Oncology) Glory Buff, MD as Referring Physician (Obstetrics and Gynecology) OTHER MD: Gwendolyn Grant MD, Chucky May MD  CHIEF COMPLAINT: Breast cancer, recurrent, estrogen receptor positive  CURRENT TREATMENT: observation  INTERVAL HISTORY:  Kelsey Mclaughlin returns today for follow-up of her estrogen receptor positive breast cancer. She is accompanied by her spouse [name]. She denies any health complaints.  She has been feeling quite well.  No change in breathing, bowel habits or urinary habits.  She denies any more headaches and she did not do the MRI brain.  Rest of the pertinent 10 point ROS reviewed and negative   COVID 19 VACCINATION STATUS: fully vaccinated +1 booster   BREAST CANCER HISTORY: From doctor [name]'s intake note [id number]:  "Kelsey Mclaughlin is a 61 y.o. female. In 1998 at the age of 21 was diagnosed with left breast cancer in [place]. This was an invasive carcinoma grade 2 stage II. At that time she underwent a lumpectomy with excellent lymph node dissection. She received 6 months of chemotherapy. [name] She also had radiation therapy to the left breast. 2011 patient noticed a lump in the outside of her left breast. She had ultrasound workup performed and was told it was negative no biopsies were performed. General 2014 after patient moved to the [place] she had a mammogram performed this revealed a possible mass in the outer quadrant of the left breast. Ultrasound showed it to be 1.7  cm. MRI of the breasts performed on January 30 revealed the mass to be 1.5 x 2.2 x 1.5 cm. Because of this she also had a biopsy performed on 10/04/2013. The biopsy revealed [[id number]) an invasive ductal carcinoma with ductal carcinoma in situ grade 2. Prognostic panel was positive for estrogen receptor 100% megestrol receptor +33% proliferation marker Ki-67 15% and the tumor was HER-2/neu positive" [with a signals ratio of 3.1 and number per cell 6.3]  The patient's subsequent history is as detailed below   PAST MEDICAL HISTORY: Past Medical History:  Diagnosis Date   Bronchitis    completed antibiotics 10/21/13/ states resolved   Cancer ([place]) 1998, 2015   Left Breast   Chronic diastolic (congestive) heart failure ([place])    Hypertension    Lymph edema    lt arm   Stroke ([place]) 2004   TIA-  no problems since    PAST SURGICAL HISTORY: Past Surgical History:  Procedure Laterality Date   ABDOMINOPLASTY/PANNICULECTOMY     BREAST IMPLANT EXCHANGE Left 09/03/2014   Procedure: LEFT REMOVAL TISSUE EXPANDERS WITH PLACEMENT OF SILICONE IMPLANT ;  Surgeon: Irene Limbo, MD;  Location: [place];  Service: Plastics;  Laterality: Left;   BREAST RECONSTRUCTION Left 03/07/2015   Procedure: LEFT NIPPLE AEROLA CREATION WITH LOCAL FLAP/FULL THICKNESS SKIN GRAFT FROM GROIN;  Surgeon: Irene Limbo, MD;  Location: [place];  Service: Plastics;  Laterality: Left;   BREAST SURGERY  1998   lumpectomy on left and removed lymphnodes in [place]   BUNIONECTOMY Left 07/31/2020   Procedure: Left Lapidus and Modified McBride Bunionectomy;  Surgeon: Wylene Simmer, MD;  Location: [place];  Service: Orthopedics;  Laterality: Left;   HYDRADENITIS EXCISION Left 05/30/2018   Procedure: Adjacent tissue transfer left axilla;  Surgeon: Irene Limbo, MD;  Location: [place];  Service: Plastics;  Laterality: Left;   LATISSIMUS FLAP TO BREAST Left  04/12/2014   Procedure: LEFT LATISSIMUS DORSI FLAP FOR LEFT BREAST RECONSTRUCTION AND PLACEMENT OF TISSUE EXPANDER;  Surgeon: Irene Limbo, MD;  Location: [place];  Service: Plastics;  Laterality: Left;   LIPOSUCTION WITH LIPOFILLING Left 09/03/2014   Procedure:  LIPOFILLING TO LEFT CHEST  ;  Surgeon: Irene Limbo, MD;  Location: [place];  Service: Plastics;  Laterality: Left;   LIPOSUCTION WITH LIPOFILLING Left 03/07/2015   Procedure: LIPOFILLING TO LEFT BREAST;  Surgeon: Irene Limbo, MD;  Location: [place];  Service: Plastics;  Laterality: Left;   MASTECTOMY Left 2015   MASTOPEXY Right 09/03/2014   Procedure: RIGHT BREAST MASTOPEXY ;  Surgeon: Irene Limbo, MD;  Location: [place];  Service: Plastics;  Laterality: Right;   PORT-A-CATH REMOVAL Right 12/24/2014   Procedure: REMOVAL PORT-A-CATH;  Surgeon: Donnie Mesa, MD;  Location: [place];  Service: General;  Laterality: Right;   PORTACATH PLACEMENT Right 10/25/2013   Procedure: INSERTION PORT-A-CATH;  Surgeon: Imogene Burn. Georgette Dover, MD;  Location: [facility];  Service: General;  Laterality: Right;   REDUCTION MAMMAPLASTY Right    SIMPLE MASTECTOMY WITH AXILLARY SENTINEL NODE BIOPSY Left 04/12/2014   Procedure: MASTECTOMY;  Surgeon: Imogene Burn. Georgette Dover, MD;  Location: [place];  Service: General;  Laterality: Left;   TISSUE EXPANDER PLACEMENT Left 04/12/2014   Procedure: TISSUE EXPANDER;  Surgeon: Irene Limbo, MD;  Location: [place];  Service: Plastics;  Laterality: Left;   TISSUE EXPANDER PLACEMENT Left 05/22/2014   Procedure: PLACEMENT OF TISSUE EXPANDER/I&D WASHOUT SEROMA;  Surgeon: Irene Limbo, MD;  Location: [place];  Service: Plastics;  Laterality: Left;    FAMILY HISTORY Family History  Problem Relation Age of Onset   Breast cancer Mother 98   Heart disease Father    Heart attack Father    Breast cancer Maternal Grandmother 77   Brain cancer Maternal Uncle 74        benign brain tumor   Heart attack Paternal Aunt     GYNECOLOGIC HISTORY:   menarche age 89, first live birth age 63. The patient is GX P2. She went through the change of life approximately 2011.    SOCIAL HISTORY:  The patient worked previously as a Marine scientist. She has 2 children both of whom live in [place], works for the [employer], and [employer], a Librarian, academic. There are no grandchildren. The patient married [name] (both women are originally from [place]) in [place]. [place], 40+ chickens and 4 horses. They're not church attenders    ADVANCED DIRECTIVES: in place   HEALTH MAINTENANCE: Social History   Tobacco Use   Smoking status: Never   Smokeless tobacco: Never  Substance Use Topics   Alcohol use: Yes    Comment: 2 drinks/day   Drug use: No     Colonoscopy:  PAP: May 2017    Bone density:  11/24/2017 showed a T score of  -1.6 osteopenia  Lipid panel:  Allergies  Allergen Reactions   Adhesive [Tape] Rash    Band-aids    Current Outpatient Medications  Medication Sig Dispense Refill   cholecalciferol (  VITAMIN D) 25 MCG (1000 UT) tablet TAKE 1 TABLET BY MOUTH EVERY DAY 100 tablet 4   escitalopram (LEXAPRO) 20 MG tablet Take 20 mg by mouth daily.     losartan-hydrochlorothiazide (HYZAAR) 100-12.5 MG tablet      No current facility-administered medications for this visit.    OBJECTIVE: White woman who appears younger than stated age  61:   06/02/22 1501  BP: 126/72  Pulse: 64  Resp: 16  Temp: 97.7 F (36.5 C)  SpO2: 99%      Body mass index is 29.95 kg/m.    Filed Weights   06/02/22 1501  Weight: 191 lb 3.2 oz (86.7 kg)    ECOG FS:1 - Symptomatic but completely ambulatory   Physical Exam Constitutional:      Appearance: Normal appearance.  Cardiovascular:     Rate and Rhythm: Normal rate and regular rhythm.     Pulses: Normal pulses.     Heart sounds: Normal heart sounds.   Chest:     Comments: Left breast status postmastectomy and implant.  Right breast normal to inspection and palpation.  No palpable masses of concern in either breast.  No regional adenopathy, Musculoskeletal:     Cervical back: Normal range of motion and neck supple. No rigidity.  Lymphadenopathy:     Cervical: No cervical adenopathy.  Skin:    General: Skin is warm and dry.  Neurological:     Mental Status: She is alert.       LAB RESULTS:  CMP     Component Value Date/Time   NA 140 05/07/2021 1349   NA 140 02/16/2017 1323   K 3.6 05/07/2021 1349   K 3.7 02/16/2017 1323   CL 107 05/07/2021 1349   CO2 25 05/07/2021 1349   CO2 26 02/16/2017 1323   GLUCOSE 104 (H) 05/07/2021 1349   GLUCOSE 113 02/16/2017 1323   BUN 13 05/07/2021 1349   BUN 12.8 02/16/2017 1323   CREATININE 0.69 05/07/2021 1349   CREATININE 0.8 02/16/2017 1323   CALCIUM 9.2 05/07/2021 1349   CALCIUM 9.7 02/16/2017 1323   PROT 6.8 05/07/2021 1349   PROT 6.9 02/16/2017 1323   ALBUMIN 3.7 05/07/2021 1349   ALBUMIN 4.1 02/16/2017 1323   AST 31 05/07/2021 1349   AST 34 02/16/2017 1323   ALT 53 (H) 05/07/2021 1349   ALT 58 (H) 02/16/2017 1323   ALKPHOS 71 05/07/2021 1349   ALKPHOS 107 02/16/2017 1323   BILITOT <0.2 (L) 05/07/2021 1349   BILITOT 0.28 02/16/2017 1323   GFRNONAA >60 05/07/2021 1349   GFRAA >60 05/01/2020 1353    I No results found for: "SPEP", "UPEP"  Lab Results  Component Value Date   WBC 8.1 06/02/2022   NEUTROABS 5.3 06/02/2022   HGB 13.4 06/02/2022   HCT 39.3 06/02/2022   MCV 89.9 06/02/2022   PLT 300 06/02/2022      Chemistry      Component Value Date/Time   NA 140 05/07/2021 1349   NA 140 02/16/2017 1323   K 3.6 05/07/2021 1349   K 3.7 02/16/2017 1323   CL 107 05/07/2021 1349   CO2 25 05/07/2021 1349   CO2 26 02/16/2017 1323   BUN 13 05/07/2021 1349   BUN 12.8 02/16/2017 1323   CREATININE 0.69 05/07/2021 1349   CREATININE 0.8 02/16/2017 1323      Component  Value Date/Time   CALCIUM 9.2 05/07/2021 1349   CALCIUM 9.7 02/16/2017 1323   ALKPHOS 71 05/07/2021 1349  ALKPHOS 107 02/16/2017 1323   AST 31 05/07/2021 1349   AST 34 02/16/2017 1323   ALT 53 (H) 05/07/2021 1349   ALT 58 (H) 02/16/2017 1323   BILITOT <0.2 (L) 05/07/2021 1349   BILITOT 0.28 02/16/2017 1323       No results found for: "LABCA2"  No components found for: "LABCA125"  No results for input(s): "INR" in the last 168 hours.  Urinalysis    Component Value Date/Time   COLORURINE YELLOW 04/05/2016 2040   APPEARANCEUR CLEAR 04/05/2016 2040   LABSPEC 1.027 04/05/2016 2040   LABSPEC 1.005 05/22/2014 1130   PHURINE 7.0 04/05/2016 2040   GLUCOSEU NEGATIVE 04/05/2016 2040   GLUCOSEU Negative 05/22/2014 1130   HGBUR NEGATIVE 04/05/2016 2040   BILIRUBINUR NEGATIVE 04/05/2016 2040   BILIRUBINUR Negative 05/22/2014 1130   KETONESUR NEGATIVE 04/05/2016 2040   PROTEINUR 100 (A) 04/05/2016 2040   UROBILINOGEN 0.2 05/22/2014 1130   NITRITE NEGATIVE 04/05/2016 2040   LEUKOCYTESUR NEGATIVE 04/05/2016 2040   LEUKOCYTESUR Negative 05/22/2014 1130    STUDIES: No results found.   ASSESSMENT: 61 y.o. BRCA negative [place] woman, [place] speaker   (1) status post left lumpectomy and axillary lymph node dissection in 1998 in [place] for a stage II invasive ductal carcinoma, grade 2, treated with adjuvant chemotherapy (specific drugs not clear) and adjuvant radiation.  (2) status post left breast upper outer quadrant biopsy 10/04/2013 for a clinically mT2 N0, stage IIA invasive ductal carcinoma, grade 2, estrogen receptor 100% positive, progesterone receptor 33% positive, with an MIB-1 of 15%, and HER-2 amplified; staging studies showed no evidence of metastatic disease  (3) started on neoadjuvant carboplatin/ docetaxel/ trastuzumab/ pertuzumab 11/12/2013  (a) completed 4 cycles as of 05/06/201, after which docetaxel was discontinued because of neuropathy symptoms   (b)  gemcitabine was given day 1 and day 8 with carboplatin day 1 in the final 2 cycles, completed 03/04/2014   (c)  completed a year of trastuzumab March 2016--  Final echo 10/21/2014 shows a normal EF  (4) left mastectomy  04/12/2014 showed residual mypT1c ypNX invasive ductal carcinoma, grade 2, estrogen and progesterone receptor positive, HER-2 negative, with negative margins  (5) anastrozole started 07/14/2014, completing five years November 2020  (a) bone density 07/29/2014 shows T score - 1.2 (mild osteopenia)  (b) bone density 11/24/2017 showed a T score of  -1.6 osteopenia  (6) 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   PLAN: Patient is doing quite well since her last visit. No concerning review of systems.  No physical examination findings today.  At this time she continues to remain cancer free from review of systems and physical exam.  She does not have any more headaches and she did not do the MRI brain and I think this is reasonable.  Once again patient and her spouse would like to continue follow-up annually at the cancer clinic.  This gives him great comfort and they feel safe.  I think this is certainly reasonable.  She was encouraged to contact us with any new questions or concerns.  Otherwise we will plan to see her back in a year. Total time spent: 30 min *Total Encounter Time as defined by the Centers for Medicare and Medicaid Services includes, in addition to the face-to-face time of a patient visit (documented in the note above) non-face-to-face time: obtaining and reviewing outside history, ordering and reviewing medications, tests or procedures, care coordination (communications with other health care professionals or caregivers)  and documentation in the medical record.

## 2022-07-08 DIAGNOSIS — H9312 Tinnitus, left ear: Secondary | ICD-10-CM | POA: Diagnosis not present

## 2022-07-08 DIAGNOSIS — H9313 Tinnitus, bilateral: Secondary | ICD-10-CM | POA: Diagnosis not present

## 2022-08-24 DIAGNOSIS — D122 Benign neoplasm of ascending colon: Secondary | ICD-10-CM | POA: Diagnosis not present

## 2022-08-24 DIAGNOSIS — Z86010 Personal history of colonic polyps: Secondary | ICD-10-CM | POA: Diagnosis not present

## 2022-08-24 DIAGNOSIS — D120 Benign neoplasm of cecum: Secondary | ICD-10-CM | POA: Diagnosis not present

## 2022-08-24 DIAGNOSIS — K648 Other hemorrhoids: Secondary | ICD-10-CM | POA: Diagnosis not present

## 2022-08-24 DIAGNOSIS — K5730 Diverticulosis of large intestine without perforation or abscess without bleeding: Secondary | ICD-10-CM | POA: Diagnosis not present

## 2022-10-19 DIAGNOSIS — L03114 Cellulitis of left upper limb: Secondary | ICD-10-CM | POA: Diagnosis not present

## 2022-10-19 DIAGNOSIS — I10 Essential (primary) hypertension: Secondary | ICD-10-CM | POA: Diagnosis not present

## 2022-10-19 DIAGNOSIS — I890 Lymphedema, not elsewhere classified: Secondary | ICD-10-CM | POA: Diagnosis not present

## 2022-11-15 ENCOUNTER — Other Ambulatory Visit: Payer: Self-pay | Admitting: Family Medicine

## 2022-11-15 DIAGNOSIS — Z1231 Encounter for screening mammogram for malignant neoplasm of breast: Secondary | ICD-10-CM

## 2022-12-28 ENCOUNTER — Ambulatory Visit
Admission: RE | Admit: 2022-12-28 | Discharge: 2022-12-28 | Disposition: A | Discharge status: Home / Self Care | Payer: Federal, State, Local not specified - PPO | Source: Ambulatory Visit | Attending: Family Medicine | Admitting: Family Medicine

## 2022-12-28 ENCOUNTER — Other Ambulatory Visit: Payer: Self-pay | Admitting: Family Medicine

## 2022-12-28 DIAGNOSIS — Z1231 Encounter for screening mammogram for malignant neoplasm of breast: Secondary | ICD-10-CM

## 2022-12-29 ENCOUNTER — Other Ambulatory Visit: Payer: Self-pay | Admitting: Family Medicine

## 2022-12-29 DIAGNOSIS — N644 Mastodynia: Secondary | ICD-10-CM

## 2023-02-11 ENCOUNTER — Ambulatory Visit
Admission: RE | Admit: 2023-02-11 | Discharge: 2023-02-11 | Disposition: A | Discharge status: Home / Self Care | Payer: Federal, State, Local not specified - PPO | Source: Ambulatory Visit | Attending: Family Medicine | Admitting: Family Medicine

## 2023-02-11 ENCOUNTER — Other Ambulatory Visit: Payer: Self-pay | Admitting: Family Medicine

## 2023-02-11 DIAGNOSIS — N644 Mastodynia: Secondary | ICD-10-CM

## 2023-02-11 DIAGNOSIS — N63 Unspecified lump in unspecified breast: Secondary | ICD-10-CM | POA: Diagnosis not present

## 2023-02-11 DIAGNOSIS — N6310 Unspecified lump in the right breast, unspecified quadrant: Secondary | ICD-10-CM

## 2023-02-16 ENCOUNTER — Ambulatory Visit
Admission: RE | Admit: 2023-02-16 | Discharge: 2023-02-16 | Disposition: A | Discharge status: Home / Self Care | Payer: Federal, State, Local not specified - PPO | Source: Ambulatory Visit | Attending: Family Medicine | Admitting: Family Medicine

## 2023-02-16 DIAGNOSIS — N6310 Unspecified lump in the right breast, unspecified quadrant: Secondary | ICD-10-CM

## 2023-02-16 DIAGNOSIS — C50511 Malignant neoplasm of lower-outer quadrant of right female breast: Secondary | ICD-10-CM | POA: Diagnosis not present

## 2023-02-16 DIAGNOSIS — D0511 Intraductal carcinoma in situ of right breast: Secondary | ICD-10-CM | POA: Diagnosis not present

## 2023-02-16 HISTORY — PX: BREAST BIOPSY: SHX20

## 2023-02-22 ENCOUNTER — Encounter: Payer: Self-pay | Admitting: Genetic Counselor

## 2023-02-22 DIAGNOSIS — Z1379 Encounter for other screening for genetic and chromosomal anomalies: Secondary | ICD-10-CM | POA: Insufficient documentation

## 2023-02-23 ENCOUNTER — Ambulatory Visit: Payer: Self-pay | Admitting: Surgery

## 2023-02-24 ENCOUNTER — Telehealth: Payer: Self-pay | Admitting: Hematology and Oncology

## 2023-02-24 NOTE — Telephone Encounter (Signed)
Left patient a vm with interpreter about upcoming appointment

## 2023-02-28 ENCOUNTER — Ambulatory Visit: Payer: Self-pay | Admitting: Surgery

## 2023-02-28 DIAGNOSIS — C50911 Malignant neoplasm of unspecified site of right female breast: Secondary | ICD-10-CM | POA: Diagnosis not present

## 2023-02-28 NOTE — H&P (Signed)
Subjective   Chief Complaint: New Consultation   Oncology - Magrinat/ [place]  History of Present Illness: Kelsey Mclaughlin is a 62 y.o. female who is seen today as an office consultation at the request of Dr. Wynelle Link for evaluation of New Consultation .    This is a 62 yo female that presents after first diagnosis of left breast cancer at age 69. This was treated in [place] with lumpectomy and axillary lymph node dissection. She received 6 months of chemotherapy as well as radiation therapy. She presented in 2011 with a palpable mass in the left upper outer quadrant. She had mammograms and ultrasounds performed of this area but no biopsies were ever performed. In 2014, she moved to [place]. She underwent screening mammogram in December 2014 and was brought back for a diagnostic left mammogram for left breast asymmetry on 10/02/13. At 1:00 in the left breast about 2 cm from the nipple there is an irregular hypoechoic mass measuring 1 x 0.4 x 1.7 cm. 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 ER and PR are positive Ki-67 15%. MRI showed the recurrent carcinoma, as well as a suspicious mass 1 cm posterior to the biopsy site.   She underwent port placement and began neoadjuvant chemotherapy. She completed her chemotherapy in early July.. She is also undergoing physical therapy for her left arm lymphedema. She underwent left mastectomy with lattisimus flap/ tissue expander reconstruction by Dr. Leta Baptist 04/12/14.  Recently, the patient began having some focal pain in the retroareolar right breast.  She had a previous right breast reduction.  She underwent mammogram and ultrasound that revealed 2 indeterminate masses in the lower outer right breast near 7:00 measuring 8 mm and 5 mm.  Both of these areas were biopsied.  The mass showed invasive ductal carcinoma grade 1 and the other mass at 7:00 10 cm from the nipple  showed DCIS with suspicion for microinvasion.  HER2 negative.  ER/PR strongly positive.  Ki-67 10%.  The patient has had longstanding left-sided lymphedema from axillary dissection at age 27 performed in [place].  Review of Systems: A complete review of systems was obtained from the patient.  I have reviewed this information and discussed as appropriate with the patient.  See HPI as well for other ROS.  Review of Systems  Constitutional: Negative.   HENT: Negative.    Eyes: Negative.   Respiratory: Negative.    Cardiovascular: Negative.   Gastrointestinal: Negative.   Genitourinary: Negative.   Musculoskeletal: Negative.   Skin: Negative.   Neurological: Negative.   Endo/Heme/Allergies: Negative.   Psychiatric/Behavioral: Negative.        Medical History: History reviewed. No pertinent past medical history.  Patient Active Problem List  Diagnosis   Chronic diastolic (congestive) heart failure (CMS/HHS-HCC)   Essential hypertension   Hepatic steatosis   History of therapeutic radiation   Lymph edema   Malignant neoplasm of upper-outer quadrant of left breast in female, estrogen receptor positive (CMS/HHS-HCC)   Past surgical history: Right breast lumpectomy with axillary lymph node dissection in 1997 in [place] Right breast mastectomy with latissimus flap reconstruction 2015  No Known Allergies  Current Outpatient Medications on File Prior to Visit  Medication Sig Dispense Refill   buPROPion (WELLBUTRIN XL) 300 MG XL tablet Take 300 mg by mouth every morning     escitalopram oxalate (LEXAPRO) 10 MG tablet Take 10 mg by mouth once daily  telmisartan-hydroCHLOROthiazide (MICARDIS HCT) 80-12.5 mg tablet Take 1 tablet by mouth once daily     No current facility-administered medications on file prior to visit.    History reviewed. No pertinent family history.   Social History   Tobacco Use  Smoking Status Not on file  Smokeless Tobacco Not on file     Social  History   Socioeconomic History   Marital status: Married    Objective:    Vitals:   02/28/23 1058 02/28/23 1059  BP: 131/81   Pulse: 58   Temp: 36.6 C (97.8 F)   SpO2: 98%   Weight: 85.1 kg (187 lb 9.6 oz)   Height: 167.6 cm (5\' 6" )   PainSc:    4  PainLoc:  Breast    Body mass index is 30.28 kg/m.  Physical Exam   Constitutional:  WDWN in NAD, conversant, no obvious deformities; lying in bed comfortably Eyes:  Pupils equal, round; sclera anicteric; moist conjunctiva; no lid lag HENT:  Oral mucosa moist; good dentition  Neck:  No masses palpated, trachea midline; no thyromegaly Lungs:  CTA bilaterally; normal respiratory effort Breasts: Left breast is surgically absent with an intact reconstruction.  No palpable masses in the subcutaneous tissue.  No axillary lymphadenopathy.  Obvious left upper extremity lymphedema Right breast shows no palpable masses.  No axillary lymphadenopathy.  No nipple discharge CV:  Regular rate and rhythm; no murmurs; extremities well-perfused with no edema Abd:  +bowel sounds, soft, non-tender, no palpable organomegaly; no palpable hernias Musc: Normal gait; no apparent clubbing or cyanosis in extremities Lymphatic:  No palpable cervical or axillary lymphadenopathy Skin:  Warm, dry; no sign of jaundice Psychiatric - alert and oriented x 4; calm mood and affect   Labs, Imaging and Diagnostic Testing:   Diagnosis 1. Breast, right, needle core biopsy, 7 o'clock, 9cmfn (coil clip) - INVASIVE DUCTAL CARCINOMA, SEE NOTE - DUCTAL CARCINOMA IN SITU, INTERMEDIATE GRADE - TUBULE FORMATION: SCORE 2 - NUCLEAR PLEOMORPHISM: SCORE 2 - MITOTIC COUNT: SCORE 1 - TOTAL SCORE: 5 - OVERALL GRADE: 1 - LYMPHOVASCULAR INVASION: NOT IDENTIFIED - CANCER LENGTH: 0.5 CM - CALCIFICATIONS: NOT IDENTIFIED NOTE: DR. Kenyon Ana REVIEWED THE CASE AND CONCURS WITH THE INTERPRETATION. A BREAST PROGNOSTIC PROFILE (ER, PR, KI-67 AND HER2) IS PENDING AND WILL BE  REPORTED IN AN ADDENDUM. [facility] WAS NOTIFIED ON 02/17/2023. 2. Breast, right, needle core biopsy, 7 o'clock, 10cmfn (venus clip) - DUCTAL CARCINOMA IN SITU, INTERMEDIATE GRADE, PAPILLARY AND SOLID TYPE SUSPICIOUS FOR MICROINVASION - NECROSIS: NOT IDENTIFIED - CALCIFICATIONS: PRESENT - DCIS LENGTH: 0.3 CM NOTE: DR. Kenyon Ana REVIEWED THE CASE AND CONCURS WITH THE INTERPRETATION. Clifton James M.D. Pathologist, Electronic Signature (Case signed 02/17/2023)  IMMUNOHISTOCHEMICAL AND MORPHOMETRIC ANALYSIS PERFORMED MANUALLY The tumor cells are NEGATIVE for Her2 (0). Estrogen Receptor: 99%, POSITIVE, STRONG STAINING INTENSITY Progesterone Receptor: 100%, POSITIVE, STRONG STAINING INTENSITY Proliferation Marker Ki67: 10% REFERENCE RANGE ESTROGEN RECEPTOR NEGATIVE 0% POSITIVE =>1% REFERENCE RANGE PROGESTERONE RECEPTOR NEGATIVE 0% POSITIVE =>1% All controls stained appropriately Jerene Bears MD Pathologist, Electronic Signature ( Signed 02/18/2023)     CLINICAL DATA:  62 year old female with focal pain in the RETROAREOLAR RIGHT breast. History of RIGHT breast reduction, LEFT breast cancer and LEFT mastectomy in 2015.   EXAM: DIGITAL DIAGNOSTIC UNILATERAL RIGHT MAMMOGRAM WITH TOMOSYNTHESIS; ULTRASOUND RIGHT BREAST LIMITED   TECHNIQUE: Right digital diagnostic mammography and breast tomosynthesis was performed.; Targeted ultrasound examination of the right breast was performed   COMPARISON:  Previous exam(s).   ACR Breast  Density Category b: There are scattered areas of fibroglandular density.   FINDINGS: Full field and spot compression views of the LEFT breast demonstrate a new partially circumscribed partially obscured mass within the far posterior LOWER OUTER RIGHT breast.   No other significant changes are identified.   Targeted ultrasound of the RIGHT breast is performed, showing the following:   A 0.8 x 0.7 x 0.8 cm irregular hypoechoic  mass at the 7 o'clock position 9 cm from the nipple.   A 0.4 x 0.4 x 0.5 cm slightly irregular hypoechoic mass at the 7 o'clock position 10 cm from the nipple.   No sonographic abnormalities within the RETROAREOLAR RIGHT breast in the area of patient's pain.   No abnormal RIGHT axillary lymph nodes are identified.   IMPRESSION: 1. Two indeterminate masses within the LOWER OUTER RIGHT breast at the 7 o'clock position, measuring 0.8 cm and 0.5 cm. Tissue sampling of both of these masses is recommended. 2. No abnormal appearing RIGHT axillary lymph nodes. 3. No mammographic or sonographic abnormality in the RETROAREOLAR RIGHT breast, in the area of patient's pain.   RECOMMENDATION: Ultrasound-guided biopsies of both RIGHT breast masses at the 7 o'clock position, which will be scheduled.   I have discussed the findings and recommendations with the patient. If applicable, a reminder letter will be sent to the patient regarding the next appointment.   BI-RADS CATEGORY  4: Suspicious.     Electronically Signed   By: Harmon Pier M.D.   On: 02/11/2023 15:26    Assessment and Plan:  Diagnoses and all orders for this visit:  Invasive ductal carcinoma of breast, female, right (CMS/HHS-HCC)    Patient has 2 areas with positive biopsies in the right lower outer quadrant.  One of these is invasive ductal carcinoma.  The other shows DCIS with suspicion for invasion.  Since the patient has had somewhat trouble with breast cancer in the past, she wants to have a right mastectomy with reconstruction.  We discussed the procedure of a right mastectomy with sentinel lymph node biopsy.  We will refer her to Dr. Lonni Fix to discuss reconstruction.  We will schedule this is soon as possible.  Once the patient has recovered completely from her mastectomy and reconstruction, we will explore possible referral to [facility] to see if there were any interventions possible for the lymphedema of the left  upper extremity  Jonai Weyland Delbert Harness, MD  02/28/2023 10:59 AM

## 2023-03-03 ENCOUNTER — Encounter: Payer: Self-pay | Admitting: *Deleted

## 2023-03-09 DIAGNOSIS — Z923 Personal history of irradiation: Secondary | ICD-10-CM | POA: Diagnosis not present

## 2023-03-09 DIAGNOSIS — Z853 Personal history of malignant neoplasm of breast: Secondary | ICD-10-CM | POA: Diagnosis not present

## 2023-03-09 DIAGNOSIS — C50511 Malignant neoplasm of lower-outer quadrant of right female breast: Secondary | ICD-10-CM | POA: Diagnosis not present

## 2023-03-09 DIAGNOSIS — Z9012 Acquired absence of left breast and nipple: Secondary | ICD-10-CM | POA: Diagnosis not present

## 2023-03-10 ENCOUNTER — Inpatient Hospital Stay
Payer: Federal, State, Local not specified - PPO | Attending: Hematology and Oncology | Admitting: Hematology and Oncology

## 2023-03-10 ENCOUNTER — Other Ambulatory Visit: Payer: Self-pay

## 2023-03-10 VITALS — BP 139/74 | HR 75 | Temp 97.3°F | Resp 18 | Wt 188.4 lb

## 2023-03-10 DIAGNOSIS — Z9012 Acquired absence of left breast and nipple: Secondary | ICD-10-CM | POA: Diagnosis not present

## 2023-03-10 DIAGNOSIS — C50412 Malignant neoplasm of upper-outer quadrant of left female breast: Secondary | ICD-10-CM

## 2023-03-10 DIAGNOSIS — Z853 Personal history of malignant neoplasm of breast: Secondary | ICD-10-CM | POA: Insufficient documentation

## 2023-03-10 DIAGNOSIS — M8580 Other specified disorders of bone density and structure, unspecified site: Secondary | ICD-10-CM | POA: Diagnosis not present

## 2023-03-10 DIAGNOSIS — Z79899 Other long term (current) drug therapy: Secondary | ICD-10-CM | POA: Diagnosis not present

## 2023-03-10 DIAGNOSIS — Z923 Personal history of irradiation: Secondary | ICD-10-CM | POA: Insufficient documentation

## 2023-03-10 DIAGNOSIS — Z9221 Personal history of antineoplastic chemotherapy: Secondary | ICD-10-CM | POA: Diagnosis not present

## 2023-03-10 DIAGNOSIS — Z170 Estrogen receptor positive status [ER+]: Secondary | ICD-10-CM

## 2023-03-10 NOTE — Progress Notes (Signed)
[facility]  [phone] [phone]     ID: Kelsey Mclaughlin OB: Oct 03, 1960  MR#: [id number]  GEX#:[id number]  Patient Care Team: Deatra James, MD as PCP - General (Family Medicine) Shelba Flake, MD as Referring Physician (Family Medicine) Glenna Fellows, MD as Consulting Physician (Plastic Surgery) Alm Bustard, MD as Referring Physician (Obstetrics and Gynecology) Rachel Moulds, MD as Consulting Physician (Hematology and Oncology) OTHER MD: Rene Paci MD, Milagros Evener MD  CHIEF COMPLAINT: Breast cancer, recurrent, estrogen receptor positive  CURRENT TREATMENT: observation  INTERVAL HISTORY:  Kelsey Mclaughlin returns today for follow-up of her estrogen receptor positive breast cancer. She is accompanied by her spouse Kelsey Mclaughlin.  Since her last visit she had some breast pain and had mammogram and ultrasound.  She is here to review the results.  She has also seen Dr. Corliss Skains and Dr. Leta Baptist and is likely going through surgery at the end of July.  Rest of the pertinent 10 point ROS reviewed and negative    COVID 19 VACCINATION STATUS: fully vaccinated +1 booster   BREAST CANCER HISTORY: From doctor Kelsey Mclaughlin's intake note 01/30/2014:  "Kelsey Mclaughlin is a 62 y.o. female. In 1998 at the age of 28 was diagnosed with left breast cancer in [place]. This was an invasive carcinoma grade 2 stage II. At that time she underwent a lumpectomy with excellent lymph node dissection. She received 6 months of chemotherapy. [name] She also had radiation therapy to the left breast. 2011 patient noticed a lump in the outside of her left breast. She had ultrasound workup performed and was told it was negative no biopsies were performed. General 2014 after patient moved to the [place] she had a mammogram performed this revealed a possible mass in the outer quadrant of the left  breast. Ultrasound showed it to be 1.7 cm. MRI of the breasts performed on January 30 revealed the mass to be 1.5 x 2.2 x 1.5 cm. Because of this she also had a biopsy performed on 10/04/2013. The biopsy revealed [[id number]) an invasive ductal carcinoma with ductal carcinoma in situ grade 2. Prognostic panel was positive for estrogen receptor 100% megestrol receptor +33% proliferation marker Ki-67 15% and the tumor was HER-2/neu positive" [with a signals ratio of 3.1 and number per cell 6.3]  The patient's subsequent history is as detailed below   PAST MEDICAL HISTORY: Past Medical History:  Diagnosis Date   Bronchitis    completed antibiotics 10/21/13/ states resolved   Cancer (HCC) 1998, 2015   Left Breast   Chronic diastolic (congestive) heart failure (HCC)    Hypertension    Lymph edema    lt arm   Stroke (HCC) 2004   TIA-  no problems since    PAST SURGICAL HISTORY: Past Surgical History:  Procedure Laterality Date   ABDOMINOPLASTY/PANNICULECTOMY     BREAST BIOPSY Right 02/16/2023   [place] RT BREAST BX W LOC DEV EA ADD LESION IMG BX SPEC US GUIDE 02/16/2023 [facility]   BREAST BIOPSY Right 02/16/2023   [place] RT BREAST BX W LOC DEV 1ST LESION IMG BX SPEC US GUIDE 02/16/2023 [facility]AMMO[facility]   BREAST IMPLANT EXCHANGE Left 09/03/2014   Procedure: LEFT REMOVAL TISSUE EXPANDERS WITH PLACEMENT OF SILICONE IMPLANT ;  Surgeon: Glenna Fellows, MD;  Location: [facility];  Service: Plastics;  Laterality: Left;   BREAST RECONSTRUCTION Left 03/07/2015   Procedure:  LEFT NIPPLE AEROLA CREATION WITH LOCAL FLAP/FULL THICKNESS SKIN GRAFT FROM GROIN;  Surgeon: Glenna Fellows, MD;  Location: [facility];  Service: Plastics;  Laterality: Left;   BREAST SURGERY  1998   lumpectomy on left and removed lymphnodes in [place]   BUNIONECTOMY Left 07/31/2020   Procedure: Left Lapidus and Modified Orpha Bur;  Surgeon: Toni Arthurs, MD;  Location: [facility];  Service: Orthopedics;  Laterality: Left;   HYDRADENITIS EXCISION Left 05/30/2018   Procedure: Adjacent tissue transfer left axilla;  Surgeon: Glenna Fellows, MD;  Location: [facility];  Service: Plastics;  Laterality: Left;   LATISSIMUS FLAP TO BREAST Left 04/12/2014   Procedure: LEFT LATISSIMUS DORSI FLAP FOR LEFT BREAST RECONSTRUCTION AND PLACEMENT OF TISSUE EXPANDER;  Surgeon: Glenna Fellows, MD;  Location: [facility];  Service: Plastics;  Laterality: Left;   LIPOSUCTION WITH LIPOFILLING Left 09/03/2014   Procedure:  LIPOFILLING TO LEFT CHEST  ;  Surgeon: Glenna Fellows, MD;  Location: [facility];  Service: Plastics;  Laterality: Left;   LIPOSUCTION WITH LIPOFILLING Left 03/07/2015   Procedure: LIPOFILLING TO LEFT BREAST;  Surgeon: Glenna Fellows, MD;  Location: [facility];  Service: Plastics;  Laterality: Left;   MASTECTOMY Left 2015   MASTOPEXY Right 09/03/2014   Procedure: RIGHT BREAST MASTOPEXY ;  Surgeon: Glenna Fellows, MD;  Location: [facility];  Service: Plastics;  Laterality: Right;   PORT-A-CATH REMOVAL Right 12/24/2014   Procedure: REMOVAL PORT-A-CATH;  Surgeon: Manus Rudd, MD;  Location: [facility];  Service: General;  Laterality: Right;   PORTACATH PLACEMENT Right 10/25/2013   Procedure: INSERTION PORT-A-CATH;  Surgeon: Wilmon Arms. Corliss Skains, MD;  Location: [facility];  Service: General;  Laterality: Right;   REDUCTION MAMMAPLASTY Right    SIMPLE MASTECTOMY WITH AXILLARY SENTINEL NODE BIOPSY Left 04/12/2014   Procedure: MASTECTOMY;  Surgeon: Wilmon Arms. Corliss Skains, MD;  Location: [facility];  Service: General;  Laterality: Left;   TISSUE EXPANDER PLACEMENT Left 04/12/2014   Procedure: TISSUE EXPANDER;  Surgeon: Glenna Fellows, MD;  Location: [facility];  Service: Plastics;  Laterality: Left;   TISSUE EXPANDER PLACEMENT Left 05/22/2014   Procedure: PLACEMENT OF TISSUE EXPANDER/I&D WASHOUT SEROMA;  Surgeon: Glenna Fellows, MD;  Location: [facility];  Service: Plastics;  Laterality: Left;    FAMILY HISTORY Family History  Problem Relation Age of Onset   Breast cancer Mother 38   Heart disease Father    Heart attack Father    Breast cancer Maternal Grandmother 42   Brain cancer Maternal Uncle 74       benign brain tumor   Heart attack Paternal Aunt     GYNECOLOGIC HISTORY:   menarche age 42, first live birth age 85. The patient is GX P2. She went through the change of life approximately 2011.    SOCIAL HISTORY:  The patient worked previously as a Engineer, civil (consulting). She has 2 children both of whom live in [place] -- Theron Arista, works for the [employer], and [employer], a Occupational psychologist. There are no grandchildren. The patient married Kelsey Mclaughlin (both women are originally from [place]) in [place] 2011. [place] with 5 dogs, 40+ chickens and 4 horses. They're not church attenders    ADVANCED DIRECTIVES: in place   HEALTH MAINTENANCE: Social History   Tobacco Use   Smoking status: Never   Smokeless tobacco: Never  Substance Use Topics   Alcohol use: Yes    Comment: 2 drinks/day  Drug use: No     Colonoscopy:  PAP: May 2017    Bone density:  11/24/2017 showed a T score of  -1.6 osteopenia  Lipid panel:  Allergies  Allergen Reactions   Adhesive [Tape] Rash    Band-aids    Current Outpatient Medications  Medication Sig Dispense Refill   cholecalciferol (VITAMIN D) 25 MCG (1000 UT) tablet TAKE 1 TABLET BY MOUTH EVERY DAY 100 tablet 4   escitalopram (LEXAPRO) 20 MG tablet Take 10 mg by mouth daily.     losartan-hydrochlorothiazide (HYZAAR) 100-12.5 MG tablet      No current facility-administered medications for this visit.    OBJECTIVE: White woman who appears younger than stated age  Vitals:   03/10/23 1210  BP: 139/74  Pulse: 75  Resp: 18  Temp: (!) 97.3 F (36.3 C)  SpO2: 97%      Body mass index is 29.51 kg/m.    Filed Weights   03/10/23 1210   Weight: 188 lb 6.4 oz (85.5 kg)    ECOG FS:1 - Symptomatic but completely ambulatory   Physical exam deferred in lieu of counseling acute    LAB RESULTS:  CMP     Component Value Date/Time   NA 140 06/02/2022 1446   NA 140 02/16/2017 1323   K 3.8 06/02/2022 1446   K 3.7 02/16/2017 1323   CL 108 06/02/2022 1446   CO2 24 06/02/2022 1446   CO2 26 02/16/2017 1323   GLUCOSE 95 06/02/2022 1446   GLUCOSE 113 02/16/2017 1323   BUN 15 06/02/2022 1446   BUN 12.8 02/16/2017 1323   CREATININE 0.72 06/02/2022 1446   CREATININE 0.8 02/16/2017 1323   CALCIUM 9.3 06/02/2022 1446   CALCIUM 9.7 02/16/2017 1323   PROT 7.1 06/02/2022 1446   PROT 6.9 02/16/2017 1323   ALBUMIN 4.3 06/02/2022 1446   ALBUMIN 4.1 02/16/2017 1323   AST 31 06/02/2022 1446   AST 34 02/16/2017 1323   ALT 51 (H) 06/02/2022 1446   ALT 58 (H) 02/16/2017 1323   ALKPHOS 67 06/02/2022 1446   ALKPHOS 107 02/16/2017 1323   BILITOT 0.3 06/02/2022 1446   BILITOT 0.28 02/16/2017 1323   GFRNONAA >60 06/02/2022 1446   GFRAA >60 05/01/2020 1353    I No results found for: "SPEP", "UPEP"  Lab Results  Component Value Date   WBC 8.1 06/02/2022   NEUTROABS 5.3 06/02/2022   HGB 13.4 06/02/2022   HCT 39.3 06/02/2022   MCV 89.9 06/02/2022   PLT 300 06/02/2022      Chemistry      Component Value Date/Time   NA 140 06/02/2022 1446   NA 140 02/16/2017 1323   K 3.8 06/02/2022 1446   K 3.7 02/16/2017 1323   CL 108 06/02/2022 1446   CO2 24 06/02/2022 1446   CO2 26 02/16/2017 1323   BUN 15 06/02/2022 1446   BUN 12.8 02/16/2017 1323   CREATININE 0.72 06/02/2022 1446   CREATININE 0.8 02/16/2017 1323      Component Value Date/Time   CALCIUM 9.3 06/02/2022 1446   CALCIUM 9.7 02/16/2017 1323   ALKPHOS 67 06/02/2022 1446   ALKPHOS 107 02/16/2017 1323   AST 31 06/02/2022 1446   AST 34 02/16/2017 1323   ALT 51 (H) 06/02/2022 1446   ALT 58 (H) 02/16/2017 1323   BILITOT 0.3 06/02/2022 1446   BILITOT 0.28 02/16/2017  1323       No results found for: "LABCA2"  No components found for: "NFAOZ308"  No results for input(s): "INR" in the last 168 hours.  Urinalysis    Component Value Date/Time   COLORURINE YELLOW 04/05/2016 2040   APPEARANCEUR CLEAR 04/05/2016 2040   LABSPEC 1.027 04/05/2016 2040   LABSPEC 1.005 05/22/2014 1130   PHURINE 7.0 04/05/2016 2040   GLUCOSEU NEGATIVE 04/05/2016 2040   GLUCOSEU Negative 05/22/2014 1130   HGBUR NEGATIVE 04/05/2016 2040   BILIRUBINUR NEGATIVE 04/05/2016 2040   BILIRUBINUR Negative 05/22/2014 1130   KETONESUR NEGATIVE 04/05/2016 2040   PROTEINUR 100 (A) 04/05/2016 2040   UROBILINOGEN 0.2 05/22/2014 1130   NITRITE NEGATIVE 04/05/2016 2040   LEUKOCYTESUR NEGATIVE 04/05/2016 2040   LEUKOCYTESUR Negative 05/22/2014 1130    STUDIES: [place] RT BREAST BX W LOC DEV 1ST LESION IMG BX SPEC US GUIDE  Addendum Date: 02/18/2023   ADDENDUM REPORT: 02/18/2023 10:19 ADDENDUM: The IMPRESSION should read as RIGHT breast masses instead of LEFT breast masses: Ultrasound-guided biopsy of a 0.8 cm LOWER OUTER RIGHT breast mass (COIL clip). Ultrasound-guided biopsy of a 0.5 cm LOWER OUTER RIGHT breast mass (VENUS clip). Electronically Signed   By: Harmon Pier M.D.   On: 02/18/2023 10:19   Addendum Date: 02/17/2023   ADDENDUM REPORT: 02/17/2023 12:41 ADDENDUM: PATHOLOGY revealed: Site 1. Breast, RIGHT, needle core biopsy, 7 o'clock, 9 cm fn (coil clip) - INVASIVE DUCTAL CARCINOMA, SEE NOTE - DUCTAL CARCINOMA IN SITU, INTERMEDIATE GRADE - TUBULE FORMATION: SCORE 2 NUCLEAR PLEOMORPHISM: SCORE 2 - MITOTIC COUNT: SCORE 1 - TOTAL SCORE: 5 - OVERALL GRADE: 1 - LYMPHOVASCULAR INVASION: NOT IDENTIFIED - CANCER LENGTH: 0.5 CM CALCIFICATIONS: NOT IDENTIFIED Pathology results are CONCORDANT with imaging findings, per Dr. Laveda Abbe. PATHOLOGY revealed: Site 2. Breast, right, needle core biopsy, 7 o'clock, 10 cm fn (venus clip) - DUCTAL CARCINOMA IN SITU, INTERMEDIATE GRADE, PAPILLARY AND SOLID TYPE -  SUSPICIOUS FOR MICROINVASION NECROSIS: NOT IDENTIFIED - CALCIFICATIONS: PRESENT - DCIS LENGTH: 0.3 CM Pathology results are CONCORDANT with imaging findings, per Dr. Laveda Abbe. Pathology results and recommendations below were discussed with patient by telephone on 02/17/2023. Patient reported biopsy site within normal limits with slight tenderness at the site. Post biopsy care instructions were reviewed, questions were answered and my direct phone number was provided to patient. Patient was instructed to call [facility] if any concerns or questions arise related to the biopsy. Pathology results were discussed with the patient by telephone by [facility] Representative, ID # [phone] Orvilla Fus. The patient's spouse Kelsey Mclaughlin said she would interpret and did not need the interpreter services. RECOMMENDATION: Surgical consultation has been arranged for patient to see Dr. Corliss Skains at [facility] on 02/28/2023. Pathology results reported by Lynett Grimes, RN on 02/17/2023. Electronically Signed   By: Harmon Pier M.D.   On: 02/17/2023 12:41   Result Date: 02/18/2023 CLINICAL DATA:  62 year old female presents for ultrasound-guided biopsies of 2 RIGHT breast masses. EXAM: ULTRASOUND GUIDED RIGHT BREAST CORE NEEDLE BIOPSY X 2 COMPARISON:  Previous exam(s). PROCEDURE: I met with the patient and we discussed the procedure of ultrasound-guided biopsy, including benefits and alternatives. We discussed the high likelihood of a successful procedure. We discussed the risks of the procedure, including infection, bleeding, tissue injury, clip migration, and inadequate sampling. Informed written consent was given. The usual time-out protocol was performed immediately prior to the procedure. ULTRASOUND GUIDED RIGHT BREAST CORE NEEDLE BIOPSY #1 (0.8 cm mass at the 7 o'clock position 9 cm from the nipple - COIL clip): Lesion quadrant: LOWER OUTER RIGHT breast Using sterile  technique and 1% Lidocaine as local  anesthetic, under direct ultrasound visualization, a 12 gauge spring-loaded device was used to perform biopsy of the 0.8 cm mass at the 7 o'clock position 9 cm from the nipple using a LATERAL approach. At the conclusion of the procedure a COIL shaped tissue marker clip was deployed into the biopsy cavity. Follow up 2 view mammogram was performed and dictated separately. ULTRASOUND GUIDED RIGHT BREAST CORE NEEDLE BIOPSY #2 (0.5 cm mass at the 7 o'clock position 10 cm from the nipple - VENUS clip): Lesion quadrant: LOWER OUTER RIGHT breast Using sterile technique and 1% Lidocaine as local anesthetic, under direct ultrasound visualization, a 12 gauge spring-loaded device was used to perform biopsy of the 0.5 cm mass at the 7 o'clock position 10 cm from the nipple using a LATERAL approach. At the conclusion of the procedure a VENUS shaped tissue marker clip was deployed into the biopsy cavity. Follow up 2 view mammogram was performed and dictated separately. IMPRESSION: Ultrasound guided biopsy of a 0.8 cm LOWER OUTER LEFT breast mass (COIL clip). Ultrasound-guided biopsy of a 0.5 cm LOWER OUTER LEFT breast mass (VENUS clip). No apparent complications. Electronically Signed: By: Harmon Pier M.D. On: 02/16/2023 13:46   [place] RT BREAST BX W LOC DEV EA ADD LESION IMG BX SPEC US GUIDE  Addendum Date: 02/18/2023   ADDENDUM REPORT: 02/18/2023 10:19 ADDENDUM: The IMPRESSION should read as RIGHT breast masses instead of LEFT breast masses: Ultrasound-guided biopsy of a 0.8 cm LOWER OUTER RIGHT breast mass (COIL clip). Ultrasound-guided biopsy of a 0.5 cm LOWER OUTER RIGHT breast mass (VENUS clip). Electronically Signed   By: Harmon Pier M.D.   On: 02/18/2023 10:19   Addendum Date: 02/17/2023   ADDENDUM REPORT: 02/17/2023 12:41 ADDENDUM: PATHOLOGY revealed: Site 1. Breast, RIGHT, needle core biopsy, 7 o'clock, 9 cm fn (coil clip) - INVASIVE DUCTAL CARCINOMA, SEE NOTE - DUCTAL CARCINOMA IN SITU, INTERMEDIATE GRADE - TUBULE  FORMATION: SCORE 2 NUCLEAR PLEOMORPHISM: SCORE 2 - MITOTIC COUNT: SCORE 1 - TOTAL SCORE: 5 - OVERALL GRADE: 1 - LYMPHOVASCULAR INVASION: NOT IDENTIFIED - CANCER LENGTH: 0.5 CM CALCIFICATIONS: NOT IDENTIFIED Pathology results are CONCORDANT with imaging findings, per Dr. Laveda Abbe. PATHOLOGY revealed: Site 2. Breast, right, needle core biopsy, 7 o'clock, 10 cm fn (venus clip) - DUCTAL CARCINOMA IN SITU, INTERMEDIATE GRADE, PAPILLARY AND SOLID TYPE - SUSPICIOUS FOR MICROINVASION NECROSIS: NOT IDENTIFIED - CALCIFICATIONS: PRESENT - DCIS LENGTH: 0.3 CM Pathology results are CONCORDANT with imaging findings, per Dr. Laveda Abbe. Pathology results and recommendations below were discussed with patient by telephone on 02/17/2023. Patient reported biopsy site within normal limits with slight tenderness at the site. Post biopsy care instructions were reviewed, questions were answered and my direct phone number was provided to patient. Patient was instructed to call [facility] if any concerns or questions arise related to the biopsy. Pathology results were discussed with the patient by telephone by [facility] Representative, ID # [phone] Orvilla Fus. The patient's spouse Kelsey Mclaughlin said she would interpret and did not need the interpreter services. RECOMMENDATION: Surgical consultation has been arranged for patient to see Dr. Corliss Skains at [facility] on 02/28/2023. Pathology results reported by Lynett Grimes, RN on 02/17/2023. Electronically Signed   By: Harmon Pier M.D.   On: 02/17/2023 12:41   Result Date: 02/18/2023 CLINICAL DATA:  62 year old female presents for ultrasound-guided biopsies of 2 RIGHT breast masses. EXAM: ULTRASOUND GUIDED RIGHT BREAST CORE NEEDLE BIOPSY X 2 COMPARISON:  Previous exam(s). PROCEDURE: I met with the patient and we discussed the procedure of ultrasound-guided biopsy, including benefits and alternatives. We discussed the high likelihood of a successful procedure. We  discussed the risks of the procedure, including infection, bleeding, tissue injury, clip migration, and inadequate sampling. Informed written consent was given. The usual time-out protocol was performed immediately prior to the procedure. ULTRASOUND GUIDED RIGHT BREAST CORE NEEDLE BIOPSY #1 (0.8 cm mass at the 7 o'clock position 9 cm from the nipple - COIL clip): Lesion quadrant: LOWER OUTER RIGHT breast Using sterile technique and 1% Lidocaine as local anesthetic, under direct ultrasound visualization, a 12 gauge spring-loaded device was used to perform biopsy of the 0.8 cm mass at the 7 o'clock position 9 cm from the nipple using a LATERAL approach. At the conclusion of the procedure a COIL shaped tissue marker clip was deployed into the biopsy cavity. Follow up 2 view mammogram was performed and dictated separately. ULTRASOUND GUIDED RIGHT BREAST CORE NEEDLE BIOPSY #2 (0.5 cm mass at the 7 o'clock position 10 cm from the nipple - VENUS clip): Lesion quadrant: LOWER OUTER RIGHT breast Using sterile technique and 1% Lidocaine as local anesthetic, under direct ultrasound visualization, a 12 gauge spring-loaded device was used to perform biopsy of the 0.5 cm mass at the 7 o'clock position 10 cm from the nipple using a LATERAL approach. At the conclusion of the procedure a VENUS shaped tissue marker clip was deployed into the biopsy cavity. Follow up 2 view mammogram was performed and dictated separately. IMPRESSION: Ultrasound guided biopsy of a 0.8 cm LOWER OUTER LEFT breast mass (COIL clip). Ultrasound-guided biopsy of a 0.5 cm LOWER OUTER LEFT breast mass (VENUS clip). No apparent complications. Electronically Signed: By: Harmon Pier M.D. On: 02/16/2023 13:46   MM CLIP PLACEMENT RIGHT  Result Date: 02/16/2023 CLINICAL DATA:  Evaluate placement of biopsy clips following 2 ultrasound-guided RIGHT breast biopsies. EXAM: 3D DIAGNOSTIC RIGHT MAMMOGRAM POST ULTRASOUND BIOPSY COMPARISON:  Previous exam(s). FINDINGS:  3D Mammographic images were obtained following ultrasound guided biopsy of a 0.8 cm mass at the 7 o'clock position 9 cm from the nipple (COIL clip) and a 0.5 cm mass at 7 o'clock position 10 cm from the nipple (VENUS clip). The COIL biopsy marking clip is in expected position at the site of biopsy. The VENUS biopsy marking clip is too far posteriorly located to be visualized mammographically. IMPRESSION: Appropriate positioning of the COIL shaped biopsy marking clip at the site of biopsy in the LOWER OUTER RIGHT breast. VENUS shaped biopsy marking clip is too far posteriorly located to be visualized mammographically. Final Assessment: Post Procedure Mammograms for Marker Placement Electronically Signed   By: Harmon Pier M.D.   On: 02/16/2023 14:14  MM 3D DIAGNOSTIC MAMMOGRAM UNILATERAL RIGHT BREAST  Result Date: 02/11/2023 CLINICAL DATA:  62 year old female with focal pain in the RETROAREOLAR RIGHT breast. History of RIGHT breast reduction, LEFT breast cancer and LEFT mastectomy in 2015. EXAM: DIGITAL DIAGNOSTIC UNILATERAL RIGHT MAMMOGRAM WITH TOMOSYNTHESIS; ULTRASOUND RIGHT BREAST LIMITED TECHNIQUE: Right digital diagnostic mammography and breast tomosynthesis was performed.; Targeted ultrasound examination of the right breast was performed COMPARISON:  Previous exam(s). ACR Breast Density Category b: There are scattered areas of fibroglandular density. FINDINGS: Full field and spot compression views of the LEFT breast demonstrate a new partially circumscribed partially obscured mass within the far posterior LOWER OUTER RIGHT breast. No other significant changes are identified. Targeted ultrasound of the RIGHT breast is performed, showing the following: A 0.8  x 0.7 x 0.8 cm irregular hypoechoic mass at the 7 o'clock position 9 cm from the nipple. A 0.4 x 0.4 x 0.5 cm slightly irregular hypoechoic mass at the 7 o'clock position 10 cm from the nipple. No sonographic abnormalities within the RETROAREOLAR RIGHT  breast in the area of patient's pain. No abnormal RIGHT axillary lymph nodes are identified. IMPRESSION: 1. Two indeterminate masses within the LOWER OUTER RIGHT breast at the 7 o'clock position, measuring 0.8 cm and 0.5 cm. Tissue sampling of both of these masses is recommended. 2. No abnormal appearing RIGHT axillary lymph nodes. 3. No mammographic or sonographic abnormality in the RETROAREOLAR RIGHT breast, in the area of patient's pain. RECOMMENDATION: Ultrasound-guided biopsies of both RIGHT breast masses at the 7 o'clock position, which will be scheduled. I have discussed the findings and recommendations with the patient. If applicable, a reminder letter will be sent to the patient regarding the next appointment. BI-RADS CATEGORY  4: Suspicious. Electronically Signed   By: Harmon Pier M.D.   On: 02/11/2023 15:26  [place] LIMITED ULTRASOUND INCLUDING AXILLA RIGHT BREAST  Result Date: 02/11/2023 CLINICAL DATA:  62 year old female with focal pain in the RETROAREOLAR RIGHT breast. History of RIGHT breast reduction, LEFT breast cancer and LEFT mastectomy in 2015. EXAM: DIGITAL DIAGNOSTIC UNILATERAL RIGHT MAMMOGRAM WITH TOMOSYNTHESIS; ULTRASOUND RIGHT BREAST LIMITED TECHNIQUE: Right digital diagnostic mammography and breast tomosynthesis was performed.; Targeted ultrasound examination of the right breast was performed COMPARISON:  Previous exam(s). ACR Breast Density Category b: There are scattered areas of fibroglandular density. FINDINGS: Full field and spot compression views of the LEFT breast demonstrate a new partially circumscribed partially obscured mass within the far posterior LOWER OUTER RIGHT breast. No other significant changes are identified. Targeted ultrasound of the RIGHT breast is performed, showing the following: A 0.8 x 0.7 x 0.8 cm irregular hypoechoic mass at the 7 o'clock position 9 cm from the nipple. A 0.4 x 0.4 x 0.5 cm slightly irregular hypoechoic mass at the 7 o'clock position 10 cm from  the nipple. No sonographic abnormalities within the RETROAREOLAR RIGHT breast in the area of patient's pain. No abnormal RIGHT axillary lymph nodes are identified. IMPRESSION: 1. Two indeterminate masses within the LOWER OUTER RIGHT breast at the 7 o'clock position, measuring 0.8 cm and 0.5 cm. Tissue sampling of both of these masses is recommended. 2. No abnormal appearing RIGHT axillary lymph nodes. 3. No mammographic or sonographic abnormality in the RETROAREOLAR RIGHT breast, in the area of patient's pain. RECOMMENDATION: Ultrasound-guided biopsies of both RIGHT breast masses at the 7 o'clock position, which will be scheduled. I have discussed the findings and recommendations with the patient. If applicable, a reminder letter will be sent to the patient regarding the next appointment. BI-RADS CATEGORY  4: Suspicious. Electronically Signed   By: Harmon Pier M.D.   On: 02/11/2023 15:26    ASSESSMENT: 62 y.o. BRCA negative [place] woman, [place] speaker   (1) status post left lumpectomy and axillary lymph node dissection in 1998 in [place] for a stage II invasive ductal carcinoma, grade 2, treated with adjuvant chemotherapy (specific drugs not clear) and adjuvant radiation.  (2) status post left breast upper outer quadrant biopsy 10/04/2013 for a clinically mT2 N0, stage IIA invasive ductal carcinoma, grade 2, estrogen receptor 100% positive, progesterone receptor 33% positive, with an MIB-1 of 15%, and HER-2 amplified; staging studies showed no evidence of metastatic disease  (3) started on neoadjuvant carboplatin/ docetaxel/ trastuzumab/ pertuzumab 11/12/2013  (a) completed 4 cycles  as of 05/06/201, after which docetaxel was discontinued because of neuropathy symptoms   (b) gemcitabine was given day 1 and day 8 with carboplatin day 1 in the final 2 cycles, completed 03/04/2014   (c)  completed a year of trastuzumab March 2016--  Final echo 10/21/2014 shows a normal EF  (4) left mastectomy   04/12/2014 showed residual mypT1c ypNX invasive ductal carcinoma, grade 2, estrogen and progesterone receptor positive, HER-2 negative, with negative margins  (5) anastrozole started 07/14/2014, completing five years November 2020  (a) bone density 07/29/2014 shows T score - 1.2 (mild osteopenia)  (b) bone density 11/24/2017 showed a T score of  -1.6 osteopenia  (6) 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   PLAN:  Since her last visit here she had a mammogram and ultrasound which showed a hypoechoic mass at the 7 o'clock position 9 cm from the nipple measuring 8 x 7 x 8 mm and another slightly irregular hypoechoic mass at the 7 o'clock position 10 cm from the nipple measuring 4 x 4 x 5 mm.  These were biopsied and showed low-grade IDC at the 7 o'clock position 9 cm from the nipple and DCIS at the 7 o'clock position 10 cm from the nipple respectively.  The invasive ductal carcinoma is ER/PR positive HER2 negative, grade 1 and has a KI 67 of 10%. She is going to follow-up with Dr. Leta Baptist and Dr. Corliss Skains for upcoming right mastectomy.  We will also make sure that she had the comprehensive genetic testing in the past. Will consider Oncotype testing on the right breast if the final pathology showed tumor measuring more than 5 mm.  If she does not need any adjuvant chemotherapy, this will be followed by antiestrogen therapy.  We can consider anastrozole again versus exemestane or tamoxifen for antiestrogen therapy choices. She is agreeable to the plan.  I would recommend that she consider continuing it for 10 years.  Total time spent: 30 min *Total Encounter Time as defined by the Centers for Medicare and Medicaid Services includes, in addition to the face-to-face time of a patient visit (documented in the note above) non-face-to-face time: obtaining and reviewing outside history, ordering and reviewing medications, tests or procedures, care coordination  (communications with other health care professionals or caregivers) and documentation in the medical record.

## 2023-03-18 ENCOUNTER — Encounter: Payer: Self-pay | Admitting: *Deleted

## 2023-03-24 DIAGNOSIS — Z9012 Acquired absence of left breast and nipple: Secondary | ICD-10-CM | POA: Diagnosis not present

## 2023-03-24 DIAGNOSIS — Z853 Personal history of malignant neoplasm of breast: Secondary | ICD-10-CM | POA: Diagnosis not present

## 2023-03-24 DIAGNOSIS — Z923 Personal history of irradiation: Secondary | ICD-10-CM | POA: Diagnosis not present

## 2023-03-24 DIAGNOSIS — C50511 Malignant neoplasm of lower-outer quadrant of right female breast: Secondary | ICD-10-CM | POA: Diagnosis not present

## 2023-03-24 NOTE — H&P (Signed)
  Subjective Patient ID: Kelsey Mclaughlin is a 62 y.o. female.     HPI   Returns for follow up discussion breast reconstruction prior to mastectomy. Noted onset right breast retroareolar pain 2024. Diagnostic MMG/US 01/2023 showed  a 0.8 mass at 7 o'clock, 9 cmfn and a 0.5 cm mass 7 o'clock, 10 cmfn. No sonographic abnormalities within the retroareolar right breast noted. Right axilla normal. Biopsies labeled  right, 7 o'clock, 9 cmfn showed IDC with DCIS, ER/PR+, Her 2-, and right breast 7 o'clock, 10 cmfn intermediate grade DCIS. Patient has elected for mastectomy.   The patient was originally diagnosed at age 23 with IDC in the left UOQ. The patient underwent lumpectomy and full axillary lymph node dissection with ALND, XRT, chemotherapy in [place]. 2/26 LN+.  Screening MMG in December 2014 demonstrated breast asymmetry and follow up biospy demonstrated mass measuring 1 x 0.4 x 1.7 cm 2 cm from the nipple. , IDC, ER/PR +, Her 2 +. She completed neoadjuvant chemotherapy. Completed arimidex course.   She underwent left mastectomy with immediate TE/LD flap reconstruction. Final pathology with two foci IDC 1.3 and 1.0 cm, margins negative. +LVI. Course complicated by seroma and return to OR for drainage and implant exchange. She underwent subsequent Z plasty for treatment left axillary scar (prior lumpectomy ALND scar).   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    She has lymphedema of LUE and uses sleeve and compression pump.    Lives with wife who continues to work for [employer], states doing training now.   Review of Systems       Objective Physical Exam  Cardiovascular: Normal rate, regular rhythm and normal heart sounds.    Pulmonary/Chest Effort normal and breath sounds normal.    Skin   Fitzpatrick 2    Lymph: no axillary adenopathy, significant LUE lymphedema   Chest: Left chest absent with implant reconstruction, Baker 1 Right breast pseudoptosis Wise pattern scar no palpable  masses SN to nipple R 24 L 22.5 cm BW R 20 CW 14 Nipple to IMF R 11       Assessment/Plan History left breast cancer s/p lumpectomy ALND Adjuvant chemotherapy, left chest radiation Recurrent left breast cancer S/p left mastectomy with latissimus/TE reconstruction S/p silicone implant exchange, lipofilling left chest, and right mastopexy Scar contracture left axilla -s/p Z plasty Right breast ca LOQ ER+     Reviewed immediate vs delayed, autologous vs implant based reconstruction. Reviewed incisions, drains, OR length, hospital stay and recovery for each. Discussed process of expansion and implant based risks including rupture, imaging surveillance for silicone implants, infection requiring surgery or removal, contracture. Discussed future surgery dependent on adjuvant treatments. Patient would like to proceed with immediate TE reconstruction.    Plan resection NAC with mastectomy, reviewed anchor type scar similar to present but with NAC absent.   Discussed use of acellular dermis in reconstruction, cadaveric source, incorporation over several weeks, risk that if has seroma or infection can act as additional nidus for infection if not incorporated. Reviewed this is off label use of ADM. Patient declines this. I continue to plan prepectoral placement.   Additional risks including but not limited to bleeding, hematoma, seroma, infection, damage to adjacent structures, need for additional surgery, asymmetry, unacceptable cosmetic result, blood clots in legs or lungs reviewed.   Rx for oxycodone robaxin and Bactrim given.   Glenna Fellows, MD [facility] Plastic & Reconstructive Surgery  Office/ physician access line after hours [phone]

## 2023-03-25 ENCOUNTER — Encounter (HOSPITAL_BASED_OUTPATIENT_CLINIC_OR_DEPARTMENT_OTHER): Payer: Self-pay | Admitting: Surgery

## 2023-03-25 ENCOUNTER — Other Ambulatory Visit: Payer: Self-pay

## 2023-03-31 ENCOUNTER — Encounter (HOSPITAL_BASED_OUTPATIENT_CLINIC_OR_DEPARTMENT_OTHER)
Admission: RE | Admit: 2023-03-31 | Discharge: 2023-03-31 | Disposition: A | Discharge status: Home / Self Care | Payer: Federal, State, Local not specified - PPO | Source: Ambulatory Visit | Attending: Surgery | Admitting: Surgery

## 2023-03-31 ENCOUNTER — Other Ambulatory Visit: Payer: Self-pay

## 2023-03-31 DIAGNOSIS — I10 Essential (primary) hypertension: Secondary | ICD-10-CM | POA: Insufficient documentation

## 2023-03-31 DIAGNOSIS — K760 Fatty (change of) liver, not elsewhere classified: Secondary | ICD-10-CM | POA: Insufficient documentation

## 2023-03-31 DIAGNOSIS — Z01812 Encounter for preprocedural laboratory examination: Secondary | ICD-10-CM | POA: Diagnosis not present

## 2023-03-31 LAB — COMPREHENSIVE METABOLIC PANEL
ALT: 36 U/L (ref 0–44)
AST: 24 U/L (ref 15–41)
Albumin: 3.9 g/dL (ref 3.5–5.0)
Alkaline Phosphatase: 64 U/L (ref 38–126)
Anion gap: 13 (ref 5–15)
BUN: 9 mg/dL (ref 8–23)
CO2: 24 mmol/L (ref 22–32)
Calcium: 9.6 mg/dL (ref 8.9–10.3)
Chloride: 104 mmol/L (ref 98–111)
Creatinine, Ser: 0.69 mg/dL (ref 0.44–1.00)
GFR, Estimated: >60 mL/min (ref >60)
Glucose, Bld: 94 mg/dL (ref 70–99)
Potassium: 4.3 mmol/L (ref 3.5–5.1)
Sodium: 141 mmol/L (ref 135–145)
Total Bilirubin: 0.5 mg/dL (ref 0.3–1.2)
Total Protein: 6.4 g/dL — ABNORMAL LOW (ref 6.5–8.1)

## 2023-03-31 NOTE — Progress Notes (Signed)

## 2023-04-05 ENCOUNTER — Encounter (HOSPITAL_BASED_OUTPATIENT_CLINIC_OR_DEPARTMENT_OTHER)
Admission: RE | Disposition: A | Discharge status: Home / Self Care | Payer: Self-pay | Source: Ambulatory Visit | Attending: Plastic Surgery

## 2023-04-05 ENCOUNTER — Other Ambulatory Visit: Payer: Self-pay

## 2023-04-05 ENCOUNTER — Ambulatory Visit (HOSPITAL_COMMUNITY)
Admission: RE | Admit: 2023-04-05 | Discharge: 2023-04-05 | Disposition: A | Discharge status: Home / Self Care | Payer: Federal, State, Local not specified - PPO | Source: Ambulatory Visit | Attending: Surgery | Admitting: Surgery

## 2023-04-05 ENCOUNTER — Ambulatory Visit (HOSPITAL_BASED_OUTPATIENT_CLINIC_OR_DEPARTMENT_OTHER): Payer: Federal, State, Local not specified - PPO | Admitting: Anesthesiology

## 2023-04-05 ENCOUNTER — Ambulatory Visit (HOSPITAL_BASED_OUTPATIENT_CLINIC_OR_DEPARTMENT_OTHER)
Admission: RE | Admit: 2023-04-05 | Discharge: 2023-04-06 | Disposition: A | Discharge status: Home / Self Care | Payer: Federal, State, Local not specified - PPO | Source: Ambulatory Visit | Attending: Plastic Surgery | Admitting: Plastic Surgery

## 2023-04-05 ENCOUNTER — Encounter (HOSPITAL_BASED_OUTPATIENT_CLINIC_OR_DEPARTMENT_OTHER): Payer: Self-pay | Admitting: Surgery

## 2023-04-05 DIAGNOSIS — Z8673 Personal history of transient ischemic attack (TIA), and cerebral infarction without residual deficits: Secondary | ICD-10-CM | POA: Insufficient documentation

## 2023-04-05 DIAGNOSIS — G8918 Other acute postprocedural pain: Secondary | ICD-10-CM | POA: Diagnosis not present

## 2023-04-05 DIAGNOSIS — Z9221 Personal history of antineoplastic chemotherapy: Secondary | ICD-10-CM | POA: Diagnosis not present

## 2023-04-05 DIAGNOSIS — I890 Lymphedema, not elsewhere classified: Secondary | ICD-10-CM | POA: Insufficient documentation

## 2023-04-05 DIAGNOSIS — C773 Secondary and unspecified malignant neoplasm of axilla and upper limb lymph nodes: Secondary | ICD-10-CM | POA: Diagnosis not present

## 2023-04-05 DIAGNOSIS — I110 Hypertensive heart disease with heart failure: Secondary | ICD-10-CM | POA: Diagnosis not present

## 2023-04-05 DIAGNOSIS — C50911 Malignant neoplasm of unspecified site of right female breast: Secondary | ICD-10-CM | POA: Diagnosis not present

## 2023-04-05 DIAGNOSIS — I10 Essential (primary) hypertension: Secondary | ICD-10-CM

## 2023-04-05 DIAGNOSIS — Z853 Personal history of malignant neoplasm of breast: Secondary | ICD-10-CM | POA: Diagnosis not present

## 2023-04-05 DIAGNOSIS — Z170 Estrogen receptor positive status [ER+]: Secondary | ICD-10-CM | POA: Insufficient documentation

## 2023-04-05 DIAGNOSIS — C50511 Malignant neoplasm of lower-outer quadrant of right female breast: Secondary | ICD-10-CM | POA: Insufficient documentation

## 2023-04-05 DIAGNOSIS — Z9013 Acquired absence of bilateral breasts and nipples: Secondary | ICD-10-CM | POA: Insufficient documentation

## 2023-04-05 DIAGNOSIS — N644 Mastodynia: Secondary | ICD-10-CM | POA: Diagnosis not present

## 2023-04-05 DIAGNOSIS — I5032 Chronic diastolic (congestive) heart failure: Secondary | ICD-10-CM | POA: Diagnosis not present

## 2023-04-05 DIAGNOSIS — N6011 Diffuse cystic mastopathy of right breast: Secondary | ICD-10-CM | POA: Diagnosis not present

## 2023-04-05 DIAGNOSIS — K760 Fatty (change of) liver, not elsewhere classified: Secondary | ICD-10-CM

## 2023-04-05 HISTORY — PX: BREAST RECONSTRUCTION WITH PLACEMENT OF TISSUE EXPANDER AND ALLODERM: SHX6805

## 2023-04-05 HISTORY — PX: MASTECTOMY W/ SENTINEL NODE BIOPSY: SHX2001

## 2023-04-05 HISTORY — DX: Fatty (change of) liver, not elsewhere classified: K76.0

## 2023-04-05 SURGERY — MASTECTOMY WITH SENTINEL LYMPH NODE BIOPSY
Anesthesia: General | Site: Breast | Laterality: Right

## 2023-04-05 MED ORDER — CEFAZOLIN SODIUM-DEXTROSE 2-3 GM-%(50ML) IV SOLR
INTRAVENOUS | Status: DC | PRN
  Administered 2023-04-05: 2 g via INTRAVENOUS

## 2023-04-05 MED ORDER — TRANEXAMIC ACID 1000 MG/10ML IV SOLN
INTRAVENOUS | Status: AC
  Filled 2023-04-05: qty 30

## 2023-04-05 MED ORDER — MIDAZOLAM HCL 2 MG/2ML IJ SOLN
INTRAMUSCULAR | Status: AC
  Filled 2023-04-05: qty 2

## 2023-04-05 MED ORDER — FENTANYL CITRATE (PF) 100 MCG/2ML IJ SOLN
INTRAMUSCULAR | Status: AC
  Filled 2023-04-05: qty 2

## 2023-04-05 MED ORDER — SUGAMMADEX SODIUM 200 MG/2ML IV SOLN
INTRAVENOUS | Status: DC | PRN
  Administered 2023-04-05: 200 mg via INTRAVENOUS

## 2023-04-05 MED ORDER — ACETAMINOPHEN 500 MG PO TABS: 1000.0000 mg | ORAL_TABLET | Freq: Once | ORAL | Status: DC

## 2023-04-05 MED ORDER — SODIUM CHLORIDE 0.9 % IV SOLN
INTRAVENOUS | Status: AC
  Filled 2023-04-05: qty 10

## 2023-04-05 MED ORDER — ACETAMINOPHEN 500 MG PO TABS
1000.0000 mg | ORAL_TABLET | ORAL | Status: AC
  Administered 2023-04-05: 1000 mg via ORAL

## 2023-04-05 MED ORDER — FENTANYL CITRATE (PF) 250 MCG/5ML IJ SOLN
INTRAMUSCULAR | Status: DC | PRN
  Administered 2023-04-05 (×2): 50 ug via INTRAVENOUS

## 2023-04-05 MED ORDER — ACETAMINOPHEN 10 MG/ML IV SOLN: 1000.0000 mg | Freq: Once | INTRAVENOUS | Status: DC | PRN

## 2023-04-05 MED ORDER — ACETAMINOPHEN 500 MG PO TABS
ORAL_TABLET | ORAL | Status: AC
  Filled 2023-04-05: qty 2

## 2023-04-05 MED ORDER — KETOROLAC TROMETHAMINE 30 MG/ML IJ SOLN
30.0000 mg | Freq: Four times a day (QID) | INTRAMUSCULAR | Status: AC
  Administered 2023-04-05 – 2023-04-06 (×3): 30 mg via INTRAVENOUS
  Filled 2023-04-05 (×2): qty 1

## 2023-04-05 MED ORDER — LACTATED RINGERS IV SOLN: INTRAVENOUS | Status: DC

## 2023-04-05 MED ORDER — METHOCARBAMOL 500 MG PO TABS: 500.0000 mg | ORAL_TABLET | Freq: Four times a day (QID) | ORAL | Status: DC | PRN

## 2023-04-05 MED ORDER — MIDAZOLAM HCL 2 MG/2ML IJ SOLN
1.0000 mg | Freq: Once | INTRAMUSCULAR | Status: AC
  Administered 2023-04-05: 1 mg via INTRAVENOUS

## 2023-04-05 MED ORDER — PROPOFOL 10 MG/ML IV BOLUS
INTRAVENOUS | Status: AC
  Filled 2023-04-05: qty 20

## 2023-04-05 MED ORDER — HYDROMORPHONE HCL 1 MG/ML IJ SOLN: 0.2500 mg | INTRAMUSCULAR | Status: DC | PRN

## 2023-04-05 MED ORDER — BUPIVACAINE-EPINEPHRINE (PF) 0.5% -1:200000 IJ SOLN
INTRAMUSCULAR | Status: DC | PRN
  Administered 2023-04-05: 20 mL via PERINEURAL

## 2023-04-05 MED ORDER — ONDANSETRON HCL 4 MG/2ML IJ SOLN: 4.0000 mg | Freq: Four times a day (QID) | INTRAMUSCULAR | Status: DC | PRN

## 2023-04-05 MED ORDER — ESCITALOPRAM OXALATE 10 MG PO TABS
10.0000 mg | ORAL_TABLET | Freq: Every day | ORAL | Status: DC
  Filled 2023-04-05: qty 1

## 2023-04-05 MED ORDER — LOSARTAN POTASSIUM-HCTZ 100-12.5 MG PO TABS: 1.0000 | ORAL_TABLET | Freq: Every day | ORAL | Status: DC

## 2023-04-05 MED ORDER — HYDROCHLOROTHIAZIDE 12.5 MG PO TABS
12.5000 mg | ORAL_TABLET | Freq: Every day | ORAL | Status: DC
  Administered 2023-04-05: 12.5 mg via ORAL
  Filled 2023-04-05: qty 1

## 2023-04-05 MED ORDER — CEFAZOLIN SODIUM-DEXTROSE 2-4 GM/100ML-% IV SOLN
INTRAVENOUS | Status: AC
  Filled 2023-04-05: qty 100

## 2023-04-05 MED ORDER — KCL IN DEXTROSE-NACL 20-5-0.45 MEQ/L-%-% IV SOLN
INTRAVENOUS | Status: DC
  Filled 2023-04-05: qty 1000

## 2023-04-05 MED ORDER — BUPIVACAINE LIPOSOME 1.3 % IJ SUSP
INTRAMUSCULAR | Status: DC | PRN
  Administered 2023-04-05: 10 mL via PERINEURAL

## 2023-04-05 MED ORDER — DEXAMETHASONE SODIUM PHOSPHATE 10 MG/ML IJ SOLN
INTRAMUSCULAR | Status: DC | PRN
  Administered 2023-04-05: 5 mg via INTRAVENOUS

## 2023-04-05 MED ORDER — MIDAZOLAM HCL 2 MG/2ML IJ SOLN
INTRAMUSCULAR | Status: DC | PRN
  Administered 2023-04-05: 1 mg via INTRAVENOUS

## 2023-04-05 MED ORDER — CHLORHEXIDINE GLUCONATE CLOTH 2 % EX PADS: 6.0000 | MEDICATED_PAD | Freq: Once | CUTANEOUS | Status: DC

## 2023-04-05 MED ORDER — LOSARTAN POTASSIUM 50 MG PO TABS
100.0000 mg | ORAL_TABLET | Freq: Every day | ORAL | Status: DC
  Administered 2023-04-05: 100 mg via ORAL
  Filled 2023-04-05: qty 2

## 2023-04-05 MED ORDER — EPHEDRINE 5 MG/ML INJ
INTRAVENOUS | Status: AC
  Filled 2023-04-05: qty 5

## 2023-04-05 MED ORDER — FENTANYL CITRATE (PF) 100 MCG/2ML IJ SOLN
50.0000 ug | Freq: Once | INTRAMUSCULAR | Status: AC
  Administered 2023-04-05: 50 ug via INTRAVENOUS

## 2023-04-05 MED ORDER — CEFAZOLIN SODIUM-DEXTROSE 2-4 GM/100ML-% IV SOLN: 2.0000 g | INTRAVENOUS | Status: DC

## 2023-04-05 MED ORDER — ENOXAPARIN SODIUM 40 MG/0.4ML IJ SOSY
40.0000 mg | PREFILLED_SYRINGE | INTRAMUSCULAR | Status: DC
  Administered 2023-04-06: 40 mg via SUBCUTANEOUS
  Filled 2023-04-05: qty 0.4

## 2023-04-05 MED ORDER — AMISULPRIDE (ANTIEMETIC) 5 MG/2ML IV SOLN: 10.0000 mg | Freq: Once | INTRAVENOUS | Status: DC | PRN

## 2023-04-05 MED ORDER — TRANEXAMIC ACID 1000 MG/10ML IV SOLN
Status: DC | PRN
  Administered 2023-04-05: 3000 mg via TOPICAL

## 2023-04-05 MED ORDER — CEFAZOLIN SODIUM-DEXTROSE 2-4 GM/100ML-% IV SOLN
2.0000 g | Freq: Three times a day (TID) | INTRAVENOUS | Status: AC
  Administered 2023-04-05 – 2023-04-06 (×2): 2 g via INTRAVENOUS
  Filled 2023-04-05 (×2): qty 100

## 2023-04-05 MED ORDER — ONDANSETRON HCL 4 MG/2ML IJ SOLN
INTRAMUSCULAR | Status: DC | PRN
  Administered 2023-04-05: 4 mg via INTRAVENOUS

## 2023-04-05 MED ORDER — EPHEDRINE SULFATE-NACL 50-0.9 MG/10ML-% IV SOSY
PREFILLED_SYRINGE | INTRAVENOUS | Status: DC | PRN
  Administered 2023-04-05 (×3): 5 mg via INTRAVENOUS

## 2023-04-05 MED ORDER — PROPOFOL 10 MG/ML IV BOLUS
INTRAVENOUS | Status: DC | PRN
Start: 2023-04-05 — End: 2023-04-05
  Administered 2023-04-05: 200 mg via INTRAVENOUS

## 2023-04-05 MED ORDER — TECHNETIUM TC 99M TILMANOCEPT KIT
1.0000 | PACK | Freq: Once | INTRAVENOUS | Status: AC | PRN
  Administered 2023-04-05: 1 via INTRADERMAL

## 2023-04-05 MED ORDER — ONDANSETRON 4 MG PO TBDP: 4.0000 mg | ORAL_TABLET | Freq: Four times a day (QID) | ORAL | Status: DC | PRN

## 2023-04-05 MED ORDER — HYDROCODONE-ACETAMINOPHEN 5-325 MG PO TABS
1.0000 | ORAL_TABLET | ORAL | Status: DC | PRN
  Administered 2023-04-05: 2 via ORAL
  Filled 2023-04-05: qty 2

## 2023-04-05 MED ORDER — LIDOCAINE 2% (20 MG/ML) 5 ML SYRINGE
INTRAMUSCULAR | Status: DC | PRN
  Administered 2023-04-05: 50 mg via INTRAVENOUS

## 2023-04-05 MED ORDER — GLYCOPYRROLATE PF 0.2 MG/ML IJ SOSY
PREFILLED_SYRINGE | INTRAMUSCULAR | Status: AC
  Filled 2023-04-05: qty 1

## 2023-04-05 MED ORDER — ROCURONIUM BROMIDE 10 MG/ML (PF) SYRINGE
PREFILLED_SYRINGE | INTRAVENOUS | Status: AC
  Filled 2023-04-05: qty 10

## 2023-04-05 MED ORDER — ROCURONIUM BROMIDE 10 MG/ML (PF) SYRINGE
PREFILLED_SYRINGE | INTRAVENOUS | Status: DC | PRN
  Administered 2023-04-05: 30 mg via INTRAVENOUS
  Administered 2023-04-05: 70 mg via INTRAVENOUS

## 2023-04-05 MED ORDER — GLYCOPYRROLATE PF 0.2 MG/ML IJ SOSY
PREFILLED_SYRINGE | INTRAMUSCULAR | Status: DC | PRN
  Administered 2023-04-05: .2 mg via INTRAVENOUS

## 2023-04-05 MED ORDER — SODIUM CHLORIDE 0.9 % IV SOLN
INTRAVENOUS | Status: DC | PRN
  Administered 2023-04-05: 500 mL

## 2023-04-05 MED ORDER — HYDROMORPHONE HCL 1 MG/ML IJ SOLN: 0.5000 mg | INTRAMUSCULAR | Status: DC | PRN

## 2023-04-05 SURGICAL SUPPLY — 89 items
ADH SKN CLS APL DERMABOND .7 (GAUZE/BANDAGES/DRESSINGS) ×1
APL PRP STRL LF DISP 70% ISPRP (MISCELLANEOUS) ×1
APL SKNCLS STERI-STRIP NONHPOA (GAUZE/BANDAGES/DRESSINGS) ×1
APPLIER CLIP 9.375 MED OPEN (MISCELLANEOUS)
APR CLP MED 9.3 20 MLT OPN (MISCELLANEOUS)
BAG DECANTER FOR FLEXI CONT (MISCELLANEOUS) ×1 IMPLANT
BENZOIN TINCTURE PRP APPL 2/3 (GAUZE/BANDAGES/DRESSINGS) ×1 IMPLANT
BINDER BREAST LRG (GAUZE/BANDAGES/DRESSINGS) IMPLANT
BINDER BREAST MEDIUM (GAUZE/BANDAGES/DRESSINGS) IMPLANT
BINDER BREAST XLRG (GAUZE/BANDAGES/DRESSINGS) IMPLANT
BINDER BREAST XXLRG (GAUZE/BANDAGES/DRESSINGS) IMPLANT
BLADE CLIPPER SURG (BLADE) IMPLANT
BLADE HEX COATED 2.75 (ELECTRODE) ×1 IMPLANT
BLADE SURG 10 STRL SS (BLADE) ×1 IMPLANT
BLADE SURG 15 STRL LF DISP TIS (BLADE) ×1 IMPLANT
BLADE SURG 15 STRL SS (BLADE) ×1
BNDG GAUZE DERMACEA FLUFF 4 (GAUZE/BANDAGES/DRESSINGS) IMPLANT
BNDG GZE DERMACEA 4 6PLY (GAUZE/BANDAGES/DRESSINGS)
CANISTER SUCT 1200ML W/VALVE (MISCELLANEOUS) ×1 IMPLANT
CHLORAPREP W/TINT 26 (MISCELLANEOUS) ×1 IMPLANT
CLIP APPLIE 9.375 MED OPEN (MISCELLANEOUS) IMPLANT
COVER BACK TABLE 60X90IN (DRAPES) ×1 IMPLANT
COVER MAYO STAND STRL (DRAPES) ×1 IMPLANT
COVER PROBE CYLINDRICAL 5X96 (MISCELLANEOUS) ×1 IMPLANT
DERMABOND ADVANCED .7 DNX12 (GAUZE/BANDAGES/DRESSINGS) ×1 IMPLANT
DRAIN CHANNEL 15F RND FF W/TCR (WOUND CARE) IMPLANT
DRAIN CHANNEL 19F RND (DRAIN) ×1 IMPLANT
DRAPE INCISE IOBAN 66X45 STRL (DRAPES) IMPLANT
DRAPE LAPAROSCOPIC ABDOMINAL (DRAPES) IMPLANT
DRAPE TOP ARMCOVERS (MISCELLANEOUS) ×1 IMPLANT
DRAPE U-SHAPE 76X120 STRL (DRAPES) ×1 IMPLANT
DRAPE UTILITY XL STRL (DRAPES) ×1 IMPLANT
DRSG TEGADERM 2-3/8X2-3/4 SM (GAUZE/BANDAGES/DRESSINGS) IMPLANT
DRSG TEGADERM 4X10 (GAUZE/BANDAGES/DRESSINGS) IMPLANT
ELECT BLADE 4.0 EZ CLEAN MEGAD (MISCELLANEOUS) ×1
ELECT COATED BLADE 2.86 ST (ELECTRODE) ×1 IMPLANT
ELECT REM PT RETURN 9FT ADLT (ELECTROSURGICAL) ×1
ELECTRODE BLDE 4.0 EZ CLN MEGD (MISCELLANEOUS) ×1 IMPLANT
ELECTRODE REM PT RTRN 9FT ADLT (ELECTROSURGICAL) ×1 IMPLANT
EVACUATOR SILICONE 100CC (DRAIN) ×1 IMPLANT
EXPANDER TISSUE FV FOURTE 500 (Prosthesis & Implant Plastic) IMPLANT
GAUZE PAD ABD 8X10 STRL (GAUZE/BANDAGES/DRESSINGS) ×2 IMPLANT
GAUZE SPONGE 4X4 12PLY STRL (GAUZE/BANDAGES/DRESSINGS) ×1 IMPLANT
GAUZE SPONGE 4X4 12PLY STRL LF (GAUZE/BANDAGES/DRESSINGS) IMPLANT
GLOVE BIO SURGEON STRL SZ 6 (GLOVE) ×1 IMPLANT
GLOVE BIO SURGEON STRL SZ7 (GLOVE) ×1 IMPLANT
GLOVE BIOGEL PI IND STRL 7.5 (GLOVE) ×1 IMPLANT
GOWN STRL REUS W/ TWL LRG LVL3 (GOWN DISPOSABLE) ×2 IMPLANT
GOWN STRL REUS W/TWL LRG LVL3 (GOWN DISPOSABLE) ×2
MARKER SKIN DUAL TIP RULER LAB (MISCELLANEOUS) IMPLANT
NDL HYPO 25X1 1.5 SAFETY (NEEDLE) ×2 IMPLANT
NDL SAFETY ECLIP 18X1.5 (MISCELLANEOUS) ×1 IMPLANT
NEEDLE HYPO 25X1 1.5 SAFETY (NEEDLE) ×2 IMPLANT
NS IRRIG 1000ML POUR BTL (IV SOLUTION) IMPLANT
PACK BASIN DAY SURGERY FS (CUSTOM PROCEDURE TRAY) ×1 IMPLANT
PENCIL SMOKE EVACUATOR (MISCELLANEOUS) ×1 IMPLANT
PIN SAFETY STERILE (MISCELLANEOUS) ×1 IMPLANT
PUNCH BIOPSY 4MM DISP (MISCELLANEOUS) IMPLANT
SHEET MEDIUM DRAPE 40X70 STRL (DRAPES) ×1 IMPLANT
SLEEVE SCD COMPRESS KNEE MED (STOCKING) ×1 IMPLANT
SPIKE FLUID TRANSFER (MISCELLANEOUS) ×1 IMPLANT
SPONGE T-LAP 18X18 ~~LOC~~+RFID (SPONGE) ×2 IMPLANT
SPONGE T-LAP 4X18 ~~LOC~~+RFID (SPONGE) IMPLANT
STAPLER VISISTAT 35W (STAPLE) ×1 IMPLANT
STRIP CLOSURE SKIN 1/2X4 (GAUZE/BANDAGES/DRESSINGS) ×1 IMPLANT
SUT CHROMIC 4 0 PS 2 18 (SUTURE) IMPLANT
SUT ETHILON 2 0 FS 18 (SUTURE) ×1 IMPLANT
SUT MNCRL AB 4-0 PS2 18 (SUTURE) ×1 IMPLANT
SUT PDS AB 2-0 CT2 27 (SUTURE) IMPLANT
SUT SILK 2 0 SH (SUTURE) ×1 IMPLANT
SUT VIC AB 3-0 SH 27 (SUTURE) ×1
SUT VIC AB 3-0 SH 27X BRD (SUTURE) IMPLANT
SUT VIC AB 4-0 PS2 18 (SUTURE) IMPLANT
SUT VICRYL 0 CT-2 (SUTURE) IMPLANT
SUT VICRYL 3-0 CR8 SH (SUTURE) IMPLANT
SUT VLOC 180 0 24IN GS25 (SUTURE) IMPLANT
SYR 10ML LL (SYRINGE) ×1 IMPLANT
SYR 50ML LL SCALE MARK (SYRINGE) ×1 IMPLANT
SYR BULB IRRIG 60ML STRL (SYRINGE) ×1 IMPLANT
SYR CONTROL 10ML LL (SYRINGE) ×2 IMPLANT
TAPE MEASURE VINYL STERILE (MISCELLANEOUS) IMPLANT
TISSUE EXPNDR FV FOURTE 500 (Prosthesis & Implant Plastic) ×1 IMPLANT
TOWEL GREEN STERILE FF (TOWEL DISPOSABLE) ×2 IMPLANT
TRACER MAGTRACE VIAL (MISCELLANEOUS) IMPLANT
TRAY DSU PREP LF (CUSTOM PROCEDURE TRAY) IMPLANT
TRAY FAXITRON CT DISP (TRAY / TRAY PROCEDURE) ×1 IMPLANT
TUBE CONNECTING 20X1/4 (TUBING) ×1 IMPLANT
UNDERPAD 30X36 HEAVY ABSORB (UNDERPADS AND DIAPERS) ×2 IMPLANT
YANKAUER SUCT BULB TIP NO VENT (SUCTIONS) ×1 IMPLANT

## 2023-04-05 NOTE — Op Note (Signed)
Operative Note   DATE OF OPERATION: 7.23.2024  LOCATION: [facility]-observation  SURGICAL DIVISION: Plastic Surgery  PREOPERATIVE DIAGNOSES:  1. Right breast cancer LOQ ER+  POSTOPERATIVE DIAGNOSES:  same  PROCEDURE:  Right breast reconstruction with tissue expander  SURGEON: Glenna Fellows MD MBA  ASSISTANT: none  ANESTHESIA:  General.   EBL: 50 ml for entire procedure  COMPLICATIONS: None immediate.   INDICATIONS FOR PROCEDURE:  The patient, Kelsey Mclaughlin, is a 62 y.o. female born on 23-Jun-1961, is here for immediate tissue expander based reconstruction following skin reduction pattern mastectomy.   FINDINGS: Natrelle 133 S FV-13-T 500 ml tissue expander placed initial fill volume 180 ml air. SN [id number]  DESCRIPTION OF PROCEDURE:  The patient was marked with the patient in the preoperative area to mark sternal notch, chest midline, anterior axillary lines and inframammary folds. Patient was marked for skin reduction mastectomy with most superior portion nipple areola marked on breast meridian. Vertical limbs marked at lateral border areolae and set at 9 cm length. The patient was taken to the operating room. SCDs were placed and IV antibiotics were given. The patient's operative site was prepped and draped in a sterile fashion. A time out was performed and all information was confirmed to be correct. I assisted in mastectomy with exposure and retraction. In supine position, the lateral limbs for resection marked and area over lower pole preserved as inferiorly based dermal pedicle. Skin de epithelialized in this area.     The cavity was irrigated with saline solution containing Ancef, gentamicin, and betadine. Hemostasis was ensured. A 19 Fr drain was placed in subcutaneous position laterally and a 15 Fr drain placed along inframammary fold. Each secured to skin with 2-0 nylon.  The expander was filled with air. The expander was secured to medial insertion pectoralis with a  0 vicryl. The superior and lateral tabs also secured to pectoralis muscle with 0-vicryl. Laterally the mastectomy flap over posterior axillary line was advanced anteriorly and the subcutaneous tissue and superficial fascia was secured to chest wall with 0-vicryl. The inferiorly based dermal pedicle was redraped superiorly over expander and secured to pectoralis medially and serratus muscle laterally with spanning suture 0-vicryl. Skin closure completed with 3-0 vicryl in fascial layer and 4-0 vicryl in dermis. Skin closure completed with 4-0 monocryl subcuticular and tissue adhesive.   Tegaderms applied, followed by dry dressing and breast binder. The patient was allowed to wake from anesthesia, extubated and taken to the recovery room in satisfactory condition.   SPECIMENS: none  DRAINS: 15 and 19 Fr JP in subcutaneous right chest  Glenna Fellows, MD [facility] Plastic & Reconstructive Surgery  Office/ physician access line after hours [phone]

## 2023-04-05 NOTE — Op Note (Signed)
Pre-op diagnosis: Invasive ductal carcinoma right breast, ductal carcinoma in situ right breast Postop diagnosis: Same Procedure performed: Right mastectomy with sentinel lymph node biopsy Surgeon:Geraldene Eisel K Ainara Eldridge Co-surgeon: Dr. Glenna Fellows Anesthesia: General via LMA with pectoralis block Indications:This is a 62 yo female that presents after first diagnosis of left breast cancer at age 53. This was treated in [place] with lumpectomy and axillary lymph node dissection. She received 6 months of chemotherapy as well as radiation therapy. She presented in 2011 with a palpable mass in the left upper outer quadrant. She had mammograms and ultrasounds performed of this area but no biopsies were ever performed. In 2014, she moved to [place]. She underwent screening mammogram in December 2014 and was brought back for a diagnostic left mammogram for left breast asymmetry on 10/02/13. At 1:00 in the left breast about 2 cm from the nipple there is an irregular hypoechoic mass measuring 1 x 0.4 x 1.7 cm. 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 ER and PR are positive Ki-67 15%. MRI showed the recurrent carcinoma, as well as a suspicious mass 1 cm posterior to the biopsy site.   She underwent port placement and began neoadjuvant chemotherapy. She completed her chemotherapy in early July.. She is also undergoing physical therapy for her left arm lymphedema. She underwent left mastectomy with lattisimus flap/ tissue expander reconstruction by Dr. Leta Baptist 04/12/14.   Recently, the patient began having some focal pain in the retroareolar right breast.  She had a previous right breast reduction.  She underwent mammogram and ultrasound that revealed 2 indeterminate masses in the lower outer right breast near 7:00 measuring 8 mm and 5 mm.  Both of these areas were biopsied.  The mass showed invasive ductal carcinoma grade 1 and the  other mass at 7:00 10 cm from the nipple showed DCIS with suspicion for microinvasion.  HER2 negative.  ER/PR strongly positive.  Ki-67 10%.  Patient has opted for a right mastectomy with immediate reconstruction.  Description of procedure: Patient is brought to the operating room and placed in the supine position on the operating table.  After an adequate level of general anesthesia was obtained, the patient's entire chest and both axilla were prepped with ChloraPrep and draped in sterile fashion.  A timeout was taken to ensure the proper patient and proper procedure.  She had previously been injected with both MAG trace and technetium sulfur colloid in the preop area.  A triangular incision was outlined around the nipple areolar complex.  We made our skin incision with a scalpel.  Cautery was then used to dissect the skin flaps away from the breast.  Medially we dissected to the edge of the sternum.  Inferiorly we dissected down to the inframammary crease.  Superiorly we dissected to the chest wall below the clavicle.  Laterally we dissected to the anterior edge of the latissimus.  Once we had raised our skin flaps, I dissected the breast tissue off of the underlying pectoralis muscle from medial to lateral.  The specimen was oriented with a long suture lateral and a short suture superior.  The specimen was sent for pathology.  We then interrogated the axilla with the Sentimag probe.  There was minimal activity noted.  However, there was significant activity axilla with the neoprobe.  I was able to identify 3 separate lymph nodes that were sent as sentinel lymph nodes #1, #2, #3.  There was minimal  background activity.  Hemostasis was obtained with clips.  We then irrigated the mastectomy cavity thoroughly and inspected for hemostasis.  Laparotomy sponge soaked with TXA solution was placed into the wound and allowed to soak.  We then turned the procedure over to Dr. Leta Baptist for the reconstruction.  Wilmon Arms. Corliss Skains, MD, [facility] Surgery  General Surgery   04/05/2023 12:54 PM

## 2023-04-05 NOTE — H&P (Signed)
Subjective    Chief Complaint: New Consultation   Oncology - Magrinat/ Iruku   History of Present Illness: Kelsey Mclaughlin is a 62 y.o. female who is seen today as an office consultation at the request of Dr. Wynelle Link for evaluation of New Consultation .     This is a 62 yo female that presents after first diagnosis of left breast cancer at age 6. This was treated in [place] with lumpectomy and axillary lymph node dissection. She received 6 months of chemotherapy as well as radiation therapy. She presented in 2011 with a palpable mass in the left upper outer quadrant. She had mammograms and ultrasounds performed of this area but no biopsies were ever performed. In 2014, she moved to [place]. She underwent screening mammogram in December 2014 and was brought back for a diagnostic left mammogram for left breast asymmetry on 10/02/13. At 1:00 in the left breast about 2 cm from the nipple there is an irregular hypoechoic mass measuring 1 x 0.4 x 1.7 cm. 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 ER and PR are positive Ki-67 15%. MRI showed the recurrent carcinoma, as well as a suspicious mass 1 cm posterior to the biopsy site.   She underwent port placement and began neoadjuvant chemotherapy. She completed her chemotherapy in early July.. She is also undergoing physical therapy for her left arm lymphedema. She underwent left mastectomy with lattisimus flap/ tissue expander reconstruction by Dr. Leta Baptist 04/12/14.   Recently, the patient began having some focal pain in the retroareolar right breast.  She had a previous right breast reduction.  She underwent mammogram and ultrasound that revealed 2 indeterminate masses in the lower outer right breast near 7:00 measuring 8 mm and 5 mm.  Both of these areas were biopsied.  The mass showed invasive ductal carcinoma grade 1 and the other mass at 7:00 10 cm from the nipple  showed DCIS with suspicion for microinvasion.  HER2 negative.  ER/PR strongly positive.  Ki-67 10%.   The patient has had longstanding left-sided lymphedema from axillary dissection at age 29 performed in [place].   Review of Systems: A complete review of systems was obtained from the patient.  I have reviewed this information and discussed as appropriate with the patient.  See HPI as well for other ROS.   Review of Systems  Constitutional: Negative.   HENT: Negative.    Eyes: Negative.   Respiratory: Negative.    Cardiovascular: Negative.   Gastrointestinal: Negative.   Genitourinary: Negative.   Musculoskeletal: Negative.   Skin: Negative.   Neurological: Negative.   Endo/Heme/Allergies: Negative.   Psychiatric/Behavioral: Negative.          Medical History: History reviewed. No pertinent past medical history.      Patient Active Problem List  Diagnosis   Chronic diastolic (congestive) heart failure (CMS/HHS-HCC)   Essential hypertension   Hepatic steatosis   History of therapeutic radiation   Lymph edema   Malignant neoplasm of upper-outer quadrant of left breast in female, estrogen receptor positive (CMS/HHS-HCC)    Past surgical history: Right breast lumpectomy with axillary lymph node dissection in 1997 in [place] Right breast mastectomy with latissimus flap reconstruction 2015   No Known Allergies         Current Outpatient Medications on File Prior to Visit  Medication Sig Dispense Refill   buPROPion (WELLBUTRIN XL) 300 MG XL tablet Take 300 mg by mouth every  morning       escitalopram oxalate (LEXAPRO) 10 MG tablet Take 10 mg by mouth once daily       telmisartan-hydroCHLOROthiazide (MICARDIS HCT) 80-12.5 mg tablet Take 1 tablet by mouth once daily        No current facility-administered medications on file prior to visit.      History reviewed. No pertinent family history.    Social History       Tobacco Use  Smoking Status Not on file  Smokeless  Tobacco Not on file      Social History        Socioeconomic History   Marital status: Married      Objective:          Vitals:    02/28/23 1058 02/28/23 1059  BP: 131/81    Pulse: 58    Temp: 36.6 C (97.8 F)    SpO2: 98%    Weight: 85.1 kg (187 lb 9.6 oz)    Height: 167.6 cm (5\' 6" )    PainSc:     4  PainLoc:   Breast    Body mass index is 30.28 kg/m.   Physical Exam    Constitutional:  WDWN in NAD, conversant, no obvious deformities; lying in bed comfortably Eyes:  Pupils equal, round; sclera anicteric; moist conjunctiva; no lid lag HENT:  Oral mucosa moist; good dentition  Neck:  No masses palpated, trachea midline; no thyromegaly Lungs:  CTA bilaterally; normal respiratory effort Breasts: Left breast is surgically absent with an intact reconstruction.  No palpable masses in the subcutaneous tissue.  No axillary lymphadenopathy.  Obvious left upper extremity lymphedema Right breast shows no palpable masses.  No axillary lymphadenopathy.  No nipple discharge CV:  Regular rate and rhythm; no murmurs; extremities well-perfused with no edema Abd:  +bowel sounds, soft, non-tender, no palpable organomegaly; no palpable hernias Musc: Normal gait; no apparent clubbing or cyanosis in extremities Lymphatic:  No palpable cervical or axillary lymphadenopathy Skin:  Warm, dry; no sign of jaundice Psychiatric - alert and oriented x 4; calm mood and affect     Labs, Imaging and Diagnostic Testing:     Diagnosis 1. Breast, right, needle core biopsy, 7 o'clock, 9cmfn (coil clip) - INVASIVE DUCTAL CARCINOMA, SEE NOTE - DUCTAL CARCINOMA IN SITU, INTERMEDIATE GRADE - TUBULE FORMATION: SCORE 2 - NUCLEAR PLEOMORPHISM: SCORE 2 - MITOTIC COUNT: SCORE 1 - TOTAL SCORE: 5 - OVERALL GRADE: 1 - LYMPHOVASCULAR INVASION: NOT IDENTIFIED - CANCER LENGTH: 0.5 CM - CALCIFICATIONS: NOT IDENTIFIED NOTE: DR. Kenyon Ana REVIEWED THE CASE AND CONCURS WITH THE INTERPRETATION. A BREAST  PROGNOSTIC PROFILE (ER, PR, KI-67 AND HER2) IS PENDING AND WILL BE REPORTED IN AN ADDENDUM. [facility] WAS NOTIFIED ON 02/17/2023. 2. Breast, right, needle core biopsy, 7 o'clock, 10cmfn (venus clip) - DUCTAL CARCINOMA IN SITU, INTERMEDIATE GRADE, PAPILLARY AND SOLID TYPE SUSPICIOUS FOR MICROINVASION - NECROSIS: NOT IDENTIFIED - CALCIFICATIONS: PRESENT - DCIS LENGTH: 0.3 CM NOTE: DR. Kenyon Ana REVIEWED THE CASE AND CONCURS WITH THE INTERPRETATION. Clifton James M.D. Pathologist, Electronic Signature (Case signed 02/17/2023)   IMMUNOHISTOCHEMICAL AND MORPHOMETRIC ANALYSIS PERFORMED MANUALLY The tumor cells are NEGATIVE for Her2 (0). Estrogen Receptor: 99%, POSITIVE, STRONG STAINING INTENSITY Progesterone Receptor: 100%, POSITIVE, STRONG STAINING INTENSITY Proliferation Marker Ki67: 10% REFERENCE RANGE ESTROGEN RECEPTOR NEGATIVE 0% POSITIVE =>1% REFERENCE RANGE PROGESTERONE RECEPTOR NEGATIVE 0% POSITIVE =>1% All controls stained appropriately Jerene Bears MD Pathologist, Electronic Signature ( Signed 02/18/2023)         CLINICAL  DATA:  62 year old female with focal pain in the RETROAREOLAR RIGHT breast. History of RIGHT breast reduction, LEFT breast cancer and LEFT mastectomy in 2015.   EXAM: DIGITAL DIAGNOSTIC UNILATERAL RIGHT MAMMOGRAM WITH TOMOSYNTHESIS; ULTRASOUND RIGHT BREAST LIMITED   TECHNIQUE: Right digital diagnostic mammography and breast tomosynthesis was performed.; Targeted ultrasound examination of the right breast was performed   COMPARISON:  Previous exam(s).   ACR Breast Density Category b: There are scattered areas of fibroglandular density.   FINDINGS: Full field and spot compression views of the LEFT breast demonstrate a new partially circumscribed partially obscured mass within the far posterior LOWER OUTER RIGHT breast.   No other significant changes are identified.   Targeted ultrasound of the RIGHT breast is performed,  showing the following:   A 0.8 x 0.7 x 0.8 cm irregular hypoechoic mass at the 7 o'clock position 9 cm from the nipple.   A 0.4 x 0.4 x 0.5 cm slightly irregular hypoechoic mass at the 7 o'clock position 10 cm from the nipple.   No sonographic abnormalities within the RETROAREOLAR RIGHT breast in the area of patient's pain.   No abnormal RIGHT axillary lymph nodes are identified.   IMPRESSION: 1. Two indeterminate masses within the LOWER OUTER RIGHT breast at the 7 o'clock position, measuring 0.8 cm and 0.5 cm. Tissue sampling of both of these masses is recommended. 2. No abnormal appearing RIGHT axillary lymph nodes. 3. No mammographic or sonographic abnormality in the RETROAREOLAR RIGHT breast, in the area of patient's pain.   RECOMMENDATION: Ultrasound-guided biopsies of both RIGHT breast masses at the 7 o'clock position, which will be scheduled.   I have discussed the findings and recommendations with the patient. If applicable, a reminder letter will be sent to the patient regarding the next appointment.   BI-RADS CATEGORY  4: Suspicious.     Electronically Signed   By: Harmon Pier M.D.   On: 02/11/2023 15:26     Assessment and Plan:  Diagnoses and all orders for this visit:   Invasive ductal carcinoma of breast, female, right (CMS/HHS-HCC)     Patient has 2 areas with positive biopsies in the right lower outer quadrant.  One of these is invasive ductal carcinoma.  The other shows DCIS with suspicion for invasion.  Since the patient has had somewhat trouble with breast cancer in the past, she wants to have a right mastectomy with reconstruction.  We discussed the procedure of a right mastectomy with sentinel lymph node biopsy.  We will refer her to Dr. Lonni Fix to discuss reconstruction.  We will schedule this is soon as possible.   Once the patient has recovered completely from her mastectomy and reconstruction, we will explore possible referral to [facility] to see  if there were any interventions possible for the lymphedema of the left upper extremity    Wilmon Arms. Corliss Skains, MD, [facility] Surgery  General Surgery   04/05/2023 9:42 AM

## 2023-04-05 NOTE — Anesthesia Postprocedure Evaluation (Signed)
Anesthesia Post Note  Patient: Kelsey Mclaughlin  Procedure(s) Performed: RIGHT MASTECTOMY WITH SENTINEL LYMPH NODE BIOPSY (Right: Breast) BREAST RECONSTRUCTION WITH PLACEMENT OF TISSUE EXPANDER (Right: Breast)     Patient location during evaluation: PACU Anesthesia Type: General Level of consciousness: awake and alert Pain management: pain level controlled Vital Signs Assessment: post-procedure vital signs reviewed and stable Respiratory status: spontaneous breathing, nonlabored ventilation and respiratory function stable Cardiovascular status: blood pressure returned to baseline Postop Assessment: no apparent nausea or vomiting Anesthetic complications: no   No notable events documented.  Last Vitals:  Vitals:   04/05/23 1449 04/05/23 1502  BP:  (!) 164/67  Pulse:  77  Resp:  16  Temp:    SpO2: 95% 94%    Last Pain:  Vitals:   04/05/23 1422  TempSrc:   PainSc: 0-No pain                 Shanda Howells

## 2023-04-05 NOTE — Progress Notes (Signed)
Nuc med at bedside. Emotional support given

## 2023-04-05 NOTE — Interval H&P Note (Signed)
History and Physical Interval Note:  04/05/2023 10:41 AM  Kelsey Mclaughlin  has presented today for surgery, with the diagnosis of RIGHT BREAST INVASIVE DUCTAL CARCINOMA.  The various methods of treatment have been discussed with the patient and family. After consideration of risks, benefits and other options for treatment, the patient has consented to  Procedure(s) with comments: RIGHT MASTECTOMY WITH SENTINEL LYMPH NODE BIOPSY (Right) - PEC BLOCK BREAST RECONSTRUCTION WITH PLACEMENT OF TISSUE EXPANDER (Right) as a surgical intervention.  The patient's history has been reviewed, patient examined, no change in status, stable for surgery.  I have reviewed the patient's chart and labs.  Questions were answered to the patient's satisfaction.     Irean Hong [name]

## 2023-04-05 NOTE — Transfer of Care (Signed)
Immediate Anesthesia Transfer of Care Note  Patient: Syvanna L Sieg Mastriaco  Procedure(s) Performed: RIGHT MASTECTOMY WITH SENTINEL LYMPH NODE BIOPSY (Right: Breast) BREAST RECONSTRUCTION WITH PLACEMENT OF TISSUE EXPANDER (Right: Breast)  Patient Location: PACU  Anesthesia Type:General  Level of Consciousness: drowsy and patient cooperative  Airway & Oxygen Therapy: Patient Spontanous Breathing and Patient connected to face mask oxygen  Post-op Assessment: Report given to RN and Post -op Vital signs reviewed and stable  Post vital signs: Reviewed and stable  Last Vitals:  Vitals Value Taken Time  BP 147/71 04/05/23 1430  Temp 36.4 C 04/05/23 1422  Pulse 75 04/05/23 1439  Resp 15 04/05/23 1439  SpO2 100 % 04/05/23 1439  Vitals shown include unfiled device data.  Last Pain:  Vitals:   04/05/23 1422  TempSrc:   PainSc: 0-No pain         Complications: No notable events documented.

## 2023-04-05 NOTE — Anesthesia Preprocedure Evaluation (Addendum)
Anesthesia Evaluation  Patient identified by MRN, date of birth, ID band Patient awake    Reviewed: Allergy & Precautions, NPO status , Patient's Chart, lab work & pertinent test results  History of Anesthesia Complications Negative for: history of anesthetic complications  Airway Mallampati: II  TM Distance: >3 FB Neck ROM: Full    Dental no notable dental hx.    Pulmonary neg pulmonary ROS   Pulmonary exam normal        Cardiovascular hypertension, Pt. on medications +CHF  Normal cardiovascular exam     Neuro/Psych    Depression    TIA (2004)   GI/Hepatic negative GI ROS, Neg liver ROS,,,  Endo/Other  negative endocrine ROS    Renal/GU negative Renal ROS     Musculoskeletal negative musculoskeletal ROS (+)    Abdominal   Peds  Hematology negative hematology ROS (+)   Anesthesia Other Findings Right breast ca  Reproductive/Obstetrics                              Anesthesia Physical Anesthesia Plan  ASA: 3  Anesthesia Plan: General   Post-op Pain Management: Tylenol PO (pre-op)* and Regional block*   Induction: Intravenous  PONV Risk Score and Plan: 3 and Ondansetron, Dexamethasone, Treatment may vary due to age or medical condition and Midazolam  Airway Management Planned: Oral ETT  Additional Equipment: None  Intra-op Plan:   Post-operative Plan: Extubation in OR  Informed Consent: I have reviewed the patients History and Physical, chart, labs and discussed the procedure including the risks, benefits and alternatives for the proposed anesthesia with the patient or authorized representative who has indicated his/her understanding and acceptance.     Dental advisory given  Plan Discussed with: CRNA  Anesthesia Plan Comments:          Anesthesia Quick Evaluation

## 2023-04-05 NOTE — Progress Notes (Signed)
Assisted Dr. Howze with right, pectoralis, ultrasound guided block. Side rails up, monitors on throughout procedure. See vital signs in flow sheet. Tolerated Procedure well. 

## 2023-04-05 NOTE — Anesthesia Procedure Notes (Signed)
Anesthesia Regional Block: Pectoralis block   Pre-Anesthetic Checklist: , timeout performed,  Correct Patient, Correct Site, Correct Laterality,  Correct Procedure, Correct Position, site marked,  Risks and benefits discussed,  Pre-op evaluation,  At surgeon's request and post-op pain management  Laterality: Right  Prep: Maximum Sterile Barrier Precautions used, chloraprep       Needles:  Injection technique: Single-shot  Needle Type: Echogenic Stimulator Needle     Needle Length: 9cm  Needle Gauge: 22     Additional Needles:   Procedures:,,,, ultrasound used (permanent image in chart),,    Narrative:  Start time: 04/05/2023 10:38 AM End time: 04/05/2023 10:41 AM Injection made incrementally with aspirations every 5 mL.  Performed by: Personally  Anesthesiologist: Kaylyn Layer, MD  Additional Notes: Risks, benefits, and alternative discussed. Patient gave consent for procedure. Patient prepped and draped in sterile fashion. Sedation administered, patient remains easily responsive to voice. Relevant anatomy identified with ultrasound guidance. Local anesthetic given in 5cc increments with no signs or symptoms of intravascular injection. No pain or paraesthesias with injection. Patient monitored throughout procedure with signs of LAST or immediate complications. Tolerated well. Ultrasound image placed in chart.  Amalia Greenhouse, MD

## 2023-04-05 NOTE — Anesthesia Procedure Notes (Signed)
Procedure Name: Intubation Date/Time: 04/05/2023 11:34 AM  Performed by: Alvera Novel, CRNAPre-anesthesia Checklist: Patient identified, Emergency Drugs available, Suction available and Patient being monitored Patient Re-evaluated:Patient Re-evaluated prior to induction Oxygen Delivery Method: Circle system utilized Preoxygenation: Pre-oxygenation with 100% oxygen Induction Type: IV induction Ventilation: Mask ventilation without difficulty Laryngoscope Size: Mac and 3 Grade View: Grade II Tube type: Oral Tube size: 7.0 mm Number of attempts: 1 Airway Equipment and Method: Stylet and Oral airway Placement Confirmation: ETT inserted through vocal cords under direct vision, positive ETCO2 and breath sounds checked- equal and bilateral Secured at: 22 cm Tube secured with: Tape Dental Injury: Teeth and Oropharynx as per pre-operative assessment

## 2023-04-06 ENCOUNTER — Encounter (HOSPITAL_BASED_OUTPATIENT_CLINIC_OR_DEPARTMENT_OTHER): Payer: Self-pay | Admitting: Surgery

## 2023-04-06 DIAGNOSIS — N644 Mastodynia: Secondary | ICD-10-CM | POA: Diagnosis not present

## 2023-04-06 DIAGNOSIS — I890 Lymphedema, not elsewhere classified: Secondary | ICD-10-CM | POA: Diagnosis not present

## 2023-04-06 DIAGNOSIS — Z853 Personal history of malignant neoplasm of breast: Secondary | ICD-10-CM | POA: Diagnosis not present

## 2023-04-06 DIAGNOSIS — N6011 Diffuse cystic mastopathy of right breast: Secondary | ICD-10-CM | POA: Diagnosis not present

## 2023-04-06 DIAGNOSIS — Z9013 Acquired absence of bilateral breasts and nipples: Secondary | ICD-10-CM | POA: Diagnosis not present

## 2023-04-06 DIAGNOSIS — I110 Hypertensive heart disease with heart failure: Secondary | ICD-10-CM | POA: Diagnosis not present

## 2023-04-06 DIAGNOSIS — C773 Secondary and unspecified malignant neoplasm of axilla and upper limb lymph nodes: Secondary | ICD-10-CM | POA: Diagnosis not present

## 2023-04-06 DIAGNOSIS — Z170 Estrogen receptor positive status [ER+]: Secondary | ICD-10-CM | POA: Diagnosis not present

## 2023-04-06 DIAGNOSIS — I5032 Chronic diastolic (congestive) heart failure: Secondary | ICD-10-CM | POA: Diagnosis not present

## 2023-04-06 DIAGNOSIS — Z9221 Personal history of antineoplastic chemotherapy: Secondary | ICD-10-CM | POA: Diagnosis not present

## 2023-04-06 DIAGNOSIS — C50911 Malignant neoplasm of unspecified site of right female breast: Secondary | ICD-10-CM | POA: Diagnosis not present

## 2023-04-06 DIAGNOSIS — Z8673 Personal history of transient ischemic attack (TIA), and cerebral infarction without residual deficits: Secondary | ICD-10-CM | POA: Diagnosis not present

## 2023-04-06 DIAGNOSIS — C50511 Malignant neoplasm of lower-outer quadrant of right female breast: Secondary | ICD-10-CM | POA: Diagnosis not present

## 2023-04-06 NOTE — Discharge Summary (Signed)
Physician Discharge Summary  Patient ID: Kelsey Mclaughlin MRN: [id number] DOB/AGE: 1960/11/21 62 y.o.  Admit date: 04/05/2023 Discharge date: 04/06/2023  Admission Diagnoses:  Discharge Diagnoses:  Principal Problem:   Breast cancer, right [facility])   Discharged Condition: stable  Hospital Course: Post operatively patient did well with pain controlled, tolerating diet, and ambulatory with minimal assist. Instructed on bathing and drain care.  Treatments: surgery: right mastectomy sentinel node tissue expander reconstruction 7.23.24  Discharge Exam: Blood pressure (!) 147/59, pulse 61, temperature 98.6 F (37 C), resp. rate 16, height 5\' 7"  (1.702 m), weight 84.7 kg, SpO2 96%. Incision/Wound: chest soft incisions dry intact drains serosanguinous  Disposition: Discharge disposition: 01-Home or Self Care       Discharge Instructions     Call MD for:  redness, tenderness, or signs of infection (pain, swelling, bleeding, redness, odor or green/yellow discharge around incision site)   Complete by: As directed    Call MD for:  temperature >100.5   Complete by: As directed    Discharge instructions   Complete by: As directed    Ok to remove dressings and shower am 7.25.24. Soap and water ok, pat Tegaderms dry. Do not remove Tegaderms. No creams or ointments over incisions. Do not let drains dangle in shower, attach to lanyard or similar.Strip and record drains twice daily and bring log to clinic visit.  Breast binder or soft compression bra all other times.  Ok to raise arms above shoulders for bathing and dressing.  No house yard work or exercise until cleared by MD.   Patient received all Rx preop. Recommend ibuprofen with meals to aid with pain control. Recommend Miralax or Dulcolax for constipation.   Driving Restrictions   Complete by: As directed    No driving for 2 weeks then no driving if taking prescription pain medication.   Lifting restrictions   Complete by: As  directed    No lifting > 5-10 lbs until cleared by MD   Resume previous diet   Complete by: As directed       Allergies as of 04/06/2023       Reactions   Adhesive [tape] Rash   Band-aids        Medication List     TAKE these medications    cholecalciferol 25 MCG (1000 UNIT) tablet Commonly known as: VITAMIN D3 TAKE 1 TABLET BY MOUTH EVERY DAY   escitalopram 20 MG tablet Commonly known as: LEXAPRO Take 10 mg by mouth daily.   losartan-hydrochlorothiazide 100-12.5 MG tablet Commonly known as: HYZAAR        Follow-up Information     Glenna Fellows, MD Follow up.   Specialty: Plastic Surgery Contact information: [address] [place] [address] [place] [address] [phone]         Manus Rudd, MD. Schedule an appointment as soon as possible for a visit in 3 week(s).   Specialty: General Surgery Contact information: [address] [place] [address] [place] [address] [phone]                 Signed: Glenna Fellows 04/06/2023, 7:44 AM

## 2023-04-06 NOTE — Discharge Instructions (Addendum)
About my Jackson-Pratt Bulb Drain  What is a Jackson-Pratt bulb? A Jackson-Pratt is a soft, round device used to collect drainage. It is connected to a long, thin drainage catheter, which is held in place by one or two small stiches near your surgical incision site. When the bulb is squeezed, it forms a vacuum, forcing the drainage to empty into the bulb.  Emptying the Jackson-Pratt bulb- To empty the bulb: 1. Release the plug on the top of the bulb. 2. Pour the bulb's contents into a measuring container which your nurse will provide. 3. Record the time emptied and amount of drainage. Empty the drain(s) as often as your     doctor or nurse recommends.  Date                  Time                    Amount (Drain 1)                 Amount (Drain 2)  _____________________________________________________________________  _____________________________________________________________________  _____________________________________________________________________  _____________________________________________________________________  _____________________________________________________________________  _____________________________________________________________________  _____________________________________________________________________  _____________________________________________________________________  Squeezing the Jackson-Pratt Bulb- To squeeze the bulb: 1. Make sure the plug at the top of the bulb is open. 2. Squeeze the bulb tightly in your fist. You will hear air squeezing from the bulb. 3. Replace the plug while the bulb is squeezed. 4. Use a safety pin to attach the bulb to your clothing. This will keep the catheter from     pulling at the bulb insertion site.  When to call your doctor- Call your doctor if: Drain site becomes red, swollen or hot. You have a fever greater than 101 degrees F. There is oozing at the drain site. Drain falls out (apply a guaze bandage  over the drain hole and secure it with tape). Drainage increases daily not related to activity patterns. (You will usually have more drainage when you are active than when you are resting.) Drainage has a bad odor.  Information for Discharge Teaching: EXPAREL (bupivacaine liposome injectable suspension)   Your surgeon or anesthesiologist gave you EXPAREL(bupivacaine) to help control your pain after surgery.  EXPAREL is a local anesthetic that provides pain relief by numbing the tissue around the surgical site. EXPAREL is designed to release pain medication over time and can control pain for up to 72 hours. Depending on how you respond to EXPAREL, you may require less pain medication during your recovery.  Possible side effects: Temporary loss of sensation or ability to move in the area where bupivacaine was injected. Nausea, vomiting, constipation Rarely, numbness and tingling in your mouth or lips, lightheadedness, or anxiety may occur. Call your doctor right away if you think you may be experiencing any of these sensations, or if you have other questions regarding possible side effects.  Follow all other discharge instructions given to you by your surgeon or nurse. Eat a healthy diet and drink plenty of water or other fluids.  If you return to the hospital for any reason within 96 hours following the administration of EXPAREL, it is important for health care providers to know that you have received this anesthetic. A teal colored band has been placed on your arm with the date, time and amount of EXPAREL you have received in order to alert and inform your health care providers. Please leave this armband in place for the full 96 hours following administration, and then you may remove the   band.  

## 2023-04-07 ENCOUNTER — Telehealth: Payer: Self-pay | Admitting: *Deleted

## 2023-04-07 ENCOUNTER — Encounter: Payer: Self-pay | Admitting: *Deleted

## 2023-04-07 LAB — SURGICAL PATHOLOGY

## 2023-04-07 NOTE — Telephone Encounter (Signed)
Received order for oncotype testing. Requisition sent to pathology 

## 2023-04-11 ENCOUNTER — Encounter: Payer: Self-pay | Admitting: Plastic Surgery

## 2023-04-11 DIAGNOSIS — C50511 Malignant neoplasm of lower-outer quadrant of right female breast: Secondary | ICD-10-CM | POA: Diagnosis not present

## 2023-04-11 DIAGNOSIS — Z853 Personal history of malignant neoplasm of breast: Secondary | ICD-10-CM | POA: Diagnosis not present

## 2023-04-11 DIAGNOSIS — Z923 Personal history of irradiation: Secondary | ICD-10-CM | POA: Diagnosis not present

## 2023-04-11 DIAGNOSIS — Z9012 Acquired absence of left breast and nipple: Secondary | ICD-10-CM | POA: Diagnosis not present

## 2023-04-18 DIAGNOSIS — Z9012 Acquired absence of left breast and nipple: Secondary | ICD-10-CM | POA: Diagnosis not present

## 2023-04-18 DIAGNOSIS — Z923 Personal history of irradiation: Secondary | ICD-10-CM | POA: Diagnosis not present

## 2023-04-18 DIAGNOSIS — C50511 Malignant neoplasm of lower-outer quadrant of right female breast: Secondary | ICD-10-CM | POA: Diagnosis not present

## 2023-04-18 DIAGNOSIS — Z853 Personal history of malignant neoplasm of breast: Secondary | ICD-10-CM | POA: Diagnosis not present

## 2023-04-22 ENCOUNTER — Encounter: Payer: Self-pay | Admitting: *Deleted

## 2023-04-25 DIAGNOSIS — Z853 Personal history of malignant neoplasm of breast: Secondary | ICD-10-CM | POA: Diagnosis not present

## 2023-04-25 DIAGNOSIS — C50511 Malignant neoplasm of lower-outer quadrant of right female breast: Secondary | ICD-10-CM | POA: Diagnosis not present

## 2023-04-25 DIAGNOSIS — Z923 Personal history of irradiation: Secondary | ICD-10-CM | POA: Diagnosis not present

## 2023-04-25 DIAGNOSIS — Z9012 Acquired absence of left breast and nipple: Secondary | ICD-10-CM | POA: Diagnosis not present

## 2023-04-26 ENCOUNTER — Telehealth: Payer: Self-pay | Admitting: Hematology and Oncology

## 2023-04-26 NOTE — Telephone Encounter (Signed)
I had spoken with patient's spouse in regards to rescheduling appointment, patient spouse declines in rescheduling appointment and also had requested to speak with Practice Admin regarding the issue, I had informed them of that information and stated that they would give them a call back later in the afternoon. Patient is aware that appointment has been cancelled and will await for call back from [facility]

## 2023-04-29 ENCOUNTER — Telehealth: Payer: Self-pay | Admitting: *Deleted

## 2023-04-29 NOTE — Telephone Encounter (Signed)
Placed call to exact sciences and insurance company regarding update on pending prior approval for oncotype.  Was on the phone multiple times for lengthy periods regarding this issue without being resolved.

## 2023-05-02 ENCOUNTER — Telehealth: Payer: Self-pay | Admitting: *Deleted

## 2023-05-02 ENCOUNTER — Encounter: Payer: Self-pay | Admitting: *Deleted

## 2023-05-02 DIAGNOSIS — Z853 Personal history of malignant neoplasm of breast: Secondary | ICD-10-CM | POA: Diagnosis not present

## 2023-05-02 DIAGNOSIS — Z9012 Acquired absence of left breast and nipple: Secondary | ICD-10-CM | POA: Diagnosis not present

## 2023-05-02 DIAGNOSIS — Z923 Personal history of irradiation: Secondary | ICD-10-CM | POA: Diagnosis not present

## 2023-05-02 DIAGNOSIS — C50511 Malignant neoplasm of lower-outer quadrant of right female breast: Secondary | ICD-10-CM | POA: Diagnosis not present

## 2023-05-02 NOTE — Telephone Encounter (Signed)
Received oncoytpe results of 7/3%. Patient scheduled with Dr. Al Pimple 8/21.

## 2023-05-03 DIAGNOSIS — F4322 Adjustment disorder with anxiety: Secondary | ICD-10-CM | POA: Diagnosis not present

## 2023-05-03 DIAGNOSIS — F3342 Major depressive disorder, recurrent, in full remission: Secondary | ICD-10-CM | POA: Diagnosis not present

## 2023-05-04 ENCOUNTER — Encounter: Payer: Self-pay | Admitting: *Deleted

## 2023-05-04 ENCOUNTER — Inpatient Hospital Stay
Payer: Federal, State, Local not specified - PPO | Attending: Hematology and Oncology | Admitting: Hematology and Oncology

## 2023-05-04 ENCOUNTER — Ambulatory Visit: Payer: Federal, State, Local not specified - PPO | Admitting: Hematology and Oncology

## 2023-05-04 VITALS — BP 123/73 | HR 70 | Temp 97.9°F | Resp 18 | Ht 67.0 in | Wt 188.7 lb

## 2023-05-04 DIAGNOSIS — M8580 Other specified disorders of bone density and structure, unspecified site: Secondary | ICD-10-CM | POA: Insufficient documentation

## 2023-05-04 DIAGNOSIS — Z9013 Acquired absence of bilateral breasts and nipples: Secondary | ICD-10-CM | POA: Insufficient documentation

## 2023-05-04 DIAGNOSIS — Z853 Personal history of malignant neoplasm of breast: Secondary | ICD-10-CM | POA: Diagnosis not present

## 2023-05-04 DIAGNOSIS — C50412 Malignant neoplasm of upper-outer quadrant of left female breast: Secondary | ICD-10-CM | POA: Diagnosis not present

## 2023-05-04 DIAGNOSIS — Z170 Estrogen receptor positive status [ER+]: Secondary | ICD-10-CM | POA: Diagnosis not present

## 2023-05-04 DIAGNOSIS — Z9221 Personal history of antineoplastic chemotherapy: Secondary | ICD-10-CM | POA: Insufficient documentation

## 2023-05-04 DIAGNOSIS — C50511 Malignant neoplasm of lower-outer quadrant of right female breast: Secondary | ICD-10-CM | POA: Insufficient documentation

## 2023-05-04 MED ORDER — EXEMESTANE 25 MG PO TABS: 25.0000 mg | ORAL_TABLET | Freq: Every day | ORAL | 3 refills | Status: DC

## 2023-05-04 NOTE — Progress Notes (Signed)
[facility]  Telephone:([phone] Fax:([phone]) [phone]     ID: Kelsey Mclaughlin OB: 19-Jan-1961  MR#: [id number]  GEX#:[id number]  Patient Care Team: Deatra James, MD as PCP - General (Family Medicine) Shelba Flake, MD as Referring Physician (Family Medicine) Glenna Fellows, MD as Consulting Physician (Plastic Surgery) Alm Bustard, MD as Referring Physician (Obstetrics and Gynecology) Rachel Moulds, MD as Consulting Physician (Hematology and Oncology) OTHER MD: Rene Paci MD, Milagros Evener MD  CHIEF COMPLAINT: Breast cancer, recurrent, estrogen receptor positive  CURRENT TREATMENT: observation  INTERVAL HISTORY:  Kelsey Mclaughlin returns today for follow-up of her estrogen receptor positive breast cancer. She is accompanied by her spouse Kelsey Mclaughlin.  She apparently did not have a good experience talking to our scheduler and she was not happy to follow-up with the nurse practitioner.  She tells me that she tried to pursue going to [facility] just because she was told that I was not going to be back till the end of September.  But she was very happy and thankful that I am back and I am seeing her today.  Kelsey Mclaughlin reports some mild pain in the right armpit, continues to heal from her recent surgery, follows up with Dr. Leta Baptist regularly, had reconstruction.  She is here to review Oncotype DX results and further recommendations.    Rest of the pertinent 10 point ROS reviewed and negative    COVID 19 VACCINATION STATUS: fully vaccinated +1 booster   BREAST CANCER HISTORY: From doctor [name]'s intake note 01/30/2014:  "Kelsey Mclaughlin is a 62 y.o. female. In 1998 at the age of 20 was diagnosed with left breast cancer in [place]. This was an invasive carcinoma grade 2 stage II. At that time she underwent a lumpectomy with excellent lymph node dissection. She received 6 months of chemotherapy. [name]  She also had radiation therapy to the left breast. 2011 patient noticed a lump in the outside of her left breast. She had ultrasound workup performed and was told it was negative no biopsies were performed. General 2014 after patient moved to the [place] she had a mammogram performed this revealed a possible mass in the outer quadrant of the left breast. Ultrasound showed it to be 1.7 cm. MRI of the breasts performed on January 30 revealed the mass to be 1.5 x 2.2 x 1.5 cm. Because of this she also had a biopsy performed on 10/04/2013. The biopsy revealed [[id number]) an invasive ductal carcinoma with ductal carcinoma in situ grade 2. Prognostic panel was positive for estrogen receptor 100% megestrol receptor +33% proliferation marker Ki-67 15% and the tumor was HER-2/neu positive" [with a signals ratio of 3.1 and number per cell 6.3]  The patient's subsequent history is as detailed below   PAST MEDICAL HISTORY: Past Medical History:  Diagnosis Date   Bronchitis    completed antibiotics 10/21/13/ states resolved   Cancer (HCC) 1998, 2015   Left Breast   Chronic diastolic (congestive) heart failure (HCC)    Fatty liver    Hypertension    Lymph edema    lt arm   Stroke (HCC) 2004   TIA-  no problems since    PAST SURGICAL HISTORY: Past Surgical History:  Procedure Laterality Date   ABDOMINOPLASTY/PANNICULECTOMY     BREAST BIOPSY Right 02/16/2023   [place] RT BREAST BX W LOC DEV EA ADD LESION IMG BX SPEC US GUIDE 02/16/2023 [facility]  MAMMOGRAPHY   BREAST BIOPSY Right 02/16/2023   [place] RT BREAST BX W LOC DEV 1ST LESION IMG BX SPEC US GUIDE 02/16/2023 [facility] MAMMOGRAPHY   BREAST IMPLANT EXCHANGE Left 09/03/2014   Procedure: LEFT REMOVAL TISSUE EXPANDERS WITH PLACEMENT OF SILICONE IMPLANT ;  Surgeon: Glenna Fellows, MD;  Location: [facility];  Service: Plastics;  Laterality: Left;   BREAST RECONSTRUCTION Left 03/07/2015   Procedure: LEFT NIPPLE AEROLA CREATION WITH LOCAL FLAP/FULL  THICKNESS SKIN GRAFT FROM GROIN;  Surgeon: Glenna Fellows, MD;  Location: [facility];  Service: Plastics;  Laterality: Left;   BREAST RECONSTRUCTION WITH PLACEMENT OF TISSUE EXPANDER AND ALLODERM Right 04/05/2023   Procedure: BREAST RECONSTRUCTION WITH PLACEMENT OF TISSUE EXPANDER;  Surgeon: Glenna Fellows, MD;  Location: [facility];  Service: Plastics;  Laterality: Right;   BREAST SURGERY  1998   lumpectomy on left and removed lymphnodes in [place]   BUNIONECTOMY Left 07/31/2020   Procedure: Left Lapidus and Modified Orpha Bur;  Surgeon: Toni Arthurs, MD;  Location: [facility];  Service: Orthopedics;  Laterality: Left;   HYDRADENITIS EXCISION Left 05/30/2018   Procedure: Adjacent tissue transfer left axilla;  Surgeon: Glenna Fellows, MD;  Location: [facility];  Service: Plastics;  Laterality: Left;   LATISSIMUS FLAP TO BREAST Left 04/12/2014   Procedure: LEFT LATISSIMUS DORSI FLAP FOR LEFT BREAST RECONSTRUCTION AND PLACEMENT OF TISSUE EXPANDER;  Surgeon: Glenna Fellows, MD;  Location: [facility];  Service: Plastics;  Laterality: Left;   LIPOSUCTION WITH LIPOFILLING Left 09/03/2014   Procedure:  LIPOFILLING TO LEFT CHEST  ;  Surgeon: Glenna Fellows, MD;  Location: [facility];  Service: Plastics;  Laterality: Left;   LIPOSUCTION WITH LIPOFILLING Left 03/07/2015   Procedure: LIPOFILLING TO LEFT BREAST;  Surgeon: Glenna Fellows, MD;  Location: [facility];  Service: Plastics;  Laterality: Left;   MASTECTOMY Left 2015   MASTECTOMY W/ SENTINEL NODE BIOPSY Right 04/05/2023   Procedure: RIGHT MASTECTOMY WITH SENTINEL LYMPH NODE BIOPSY;  Surgeon: Manus Rudd, MD;  Location: [facility];  Service: General;  Laterality: Right;  PEC BLOCK   MASTOPEXY Right 09/03/2014   Procedure: RIGHT BREAST MASTOPEXY ;  Surgeon: Glenna Fellows, MD;  Location: [facility];  Service:  Plastics;  Laterality: Right;   PORT-A-CATH REMOVAL Right 12/24/2014   Procedure: REMOVAL PORT-A-CATH;  Surgeon: Manus Rudd, MD;  Location: [facility];  Service: General;  Laterality: Right;   PORTACATH PLACEMENT Right 10/25/2013   Procedure: INSERTION PORT-A-CATH;  Surgeon: Wilmon Arms. Corliss Skains, MD;  Location: [facility];  Service: General;  Laterality: Right;   REDUCTION MAMMAPLASTY Right    SIMPLE MASTECTOMY WITH AXILLARY SENTINEL NODE BIOPSY Left 04/12/2014   Procedure: MASTECTOMY;  Surgeon: Wilmon Arms. Corliss Skains, MD;  Location: [facility];  Service: General;  Laterality: Left;   TISSUE EXPANDER PLACEMENT Left 04/12/2014   Procedure: TISSUE EXPANDER;  Surgeon: Glenna Fellows, MD;  Location: [facility];  Service: Plastics;  Laterality: Left;   TISSUE EXPANDER PLACEMENT Left 05/22/2014   Procedure: PLACEMENT OF TISSUE EXPANDER/I&D WASHOUT SEROMA;  Surgeon: Glenna Fellows, MD;  Location: [facility];  Service: Plastics;  Laterality: Left;    FAMILY HISTORY Family History  Problem Relation Age of Onset   Breast cancer Mother 34   Heart disease Father    Heart attack Father    Breast cancer Maternal Grandmother 4   Brain cancer Maternal Uncle 74       benign  brain tumor   Heart attack Paternal Aunt     GYNECOLOGIC HISTORY:   menarche age 19, first live birth age 66. The patient is GX P2. She went through the change of life approximately 2011.    SOCIAL HISTORY:  The patient worked previously as a Engineer, civil (consulting). She has 2 children both of whom live in [place] -- Theron Arista, works for the [employer], and [employer], a Occupational psychologist. There are no grandchildren. The patient married Kelsey Mclaughlin (both women are originally from [place]) in [place] 2011. [place] with 5 dogs, 40+ chickens and 4 horses. They're not church attenders    ADVANCED DIRECTIVES: in place   HEALTH MAINTENANCE: Social History   Tobacco Use   Smoking status: Never   Smokeless tobacco: Never  Substance Use  Topics   Alcohol use: Yes    Comment: 2 drinks/day   Drug use: No     Colonoscopy:  PAP: May 2017    Bone density:  11/24/2017 showed a T score of  -1.6 osteopenia  Lipid panel:  Allergies  Allergen Reactions   Adhesive [Tape] Rash    Band-aids   Wound Dressing Adhesive Rash    Dermabond    Current Outpatient Medications  Medication Sig Dispense Refill   cholecalciferol (VITAMIN D) 25 MCG (1000 UT) tablet TAKE 1 TABLET BY MOUTH EVERY DAY 100 tablet 4   escitalopram (LEXAPRO) 20 MG tablet Take 10 mg by mouth daily.     losartan-hydrochlorothiazide (HYZAAR) 100-12.5 MG tablet      No current facility-administered medications for this visit.    OBJECTIVE: White woman who appears younger than stated age  There were no vitals filed for this visit.     There is no height or weight on file to calculate BMI.    There were no vitals filed for this visit.   ECOG FS:1 - Symptomatic but completely ambulatory   Physical Exam Constitutional:      Appearance: Normal appearance.  Chest:     Comments: Right breast wound appears to be separated in the lower portion with pale granulation tissue.  No overt wound dehiscence.  No regional adenopathy Neurological:     Mental Status: She is alert.       LAB RESULTS:  CMP     Component Value Date/Time   NA 141 03/31/2023 1331   NA 140 02/16/2017 1323   K 4.3 03/31/2023 1331   K 3.7 02/16/2017 1323   CL 104 03/31/2023 1331   CO2 24 03/31/2023 1331   CO2 26 02/16/2017 1323   GLUCOSE 94 03/31/2023 1331   GLUCOSE 113 02/16/2017 1323   BUN 9 03/31/2023 1331   BUN 12.8 02/16/2017 1323   CREATININE 0.69 03/31/2023 1331   CREATININE 0.72 06/02/2022 1446   CREATININE 0.8 02/16/2017 1323   CALCIUM 9.6 03/31/2023 1331   CALCIUM 9.7 02/16/2017 1323   PROT 6.4 (L) 03/31/2023 1331   PROT 6.9 02/16/2017 1323   ALBUMIN 3.9 03/31/2023 1331   ALBUMIN 4.1 02/16/2017 1323   AST 24 03/31/2023 1331   AST 31 06/02/2022 1446   AST 34  02/16/2017 1323   ALT 36 03/31/2023 1331   ALT 51 (H) 06/02/2022 1446   ALT 58 (H) 02/16/2017 1323   ALKPHOS 64 03/31/2023 1331   ALKPHOS 107 02/16/2017 1323   BILITOT 0.5 03/31/2023 1331   BILITOT 0.3 06/02/2022 1446   BILITOT 0.28 02/16/2017 1323   GFRNONAA >60 03/31/2023 1331   GFRNONAA >60  06/02/2022 1446   GFRAA >60 05/01/2020 1353    I No results found for: "SPEP", "UPEP"  Lab Results  Component Value Date   WBC 8.1 06/02/2022   NEUTROABS 5.3 06/02/2022   HGB 13.4 06/02/2022   HCT 39.3 06/02/2022   MCV 89.9 06/02/2022   PLT 300 06/02/2022      Chemistry      Component Value Date/Time   NA 141 03/31/2023 1331   NA 140 02/16/2017 1323   K 4.3 03/31/2023 1331   K 3.7 02/16/2017 1323   CL 104 03/31/2023 1331   CO2 24 03/31/2023 1331   CO2 26 02/16/2017 1323   BUN 9 03/31/2023 1331   BUN 12.8 02/16/2017 1323   CREATININE 0.69 03/31/2023 1331   CREATININE 0.72 06/02/2022 1446   CREATININE 0.8 02/16/2017 1323      Component Value Date/Time   CALCIUM 9.6 03/31/2023 1331   CALCIUM 9.7 02/16/2017 1323   ALKPHOS 64 03/31/2023 1331   ALKPHOS 107 02/16/2017 1323   AST 24 03/31/2023 1331   AST 31 06/02/2022 1446   AST 34 02/16/2017 1323   ALT 36 03/31/2023 1331   ALT 51 (H) 06/02/2022 1446   ALT 58 (H) 02/16/2017 1323   BILITOT 0.5 03/31/2023 1331   BILITOT 0.3 06/02/2022 1446   BILITOT 0.28 02/16/2017 1323       No results found for: "LABCA2"  No components found for: "LABCA125"  No results for input(s): "INR" in the last 168 hours.  Urinalysis    Component Value Date/Time   COLORURINE YELLOW 04/05/2016 2040   APPEARANCEUR CLEAR 04/05/2016 2040   LABSPEC 1.027 04/05/2016 2040   LABSPEC 1.005 05/22/2014 1130   PHURINE 7.0 04/05/2016 2040   GLUCOSEU NEGATIVE 04/05/2016 2040   GLUCOSEU Negative 05/22/2014 1130   HGBUR NEGATIVE 04/05/2016 2040   BILIRUBINUR NEGATIVE 04/05/2016 2040   BILIRUBINUR Negative 05/22/2014 1130   KETONESUR NEGATIVE  04/05/2016 2040   PROTEINUR 100 (A) 04/05/2016 2040   UROBILINOGEN 0.2 05/22/2014 1130   NITRITE NEGATIVE 04/05/2016 2040   LEUKOCYTESUR NEGATIVE 04/05/2016 2040   LEUKOCYTESUR Negative 05/22/2014 1130    STUDIES: NM Sentinel Node Inj-No Rpt (Breast)  Result Date: 04/05/2023 Sulfur Colloid was injected by the Nuclear Medicine Technologist for sentinel lymph node localization.     ASSESSMENT: 62 y.o. BRCA negative [place] woman, [place] speaker   (1) status post left lumpectomy and axillary lymph node dissection in 1998 in [place] for a stage II invasive ductal carcinoma, grade 2, treated with adjuvant chemotherapy (specific drugs not clear) and adjuvant radiation.  (2) status post left breast upper outer quadrant biopsy 10/04/2013 for a clinically mT2 N0, stage IIA invasive ductal carcinoma, grade 2, estrogen receptor 100% positive, progesterone receptor 33% positive, with an MIB-1 of 15%, and HER-2 amplified; staging studies showed no evidence of metastatic disease  (3) started on neoadjuvant carboplatin/ docetaxel/ trastuzumab/ pertuzumab 11/12/2013  (a) completed 4 cycles as of 05/06/201, after which docetaxel was discontinued because of neuropathy symptoms   (b) gemcitabine was given day 1 and day 8 with carboplatin day 1 in the final 2 cycles, completed 03/04/2014   (c)  completed a year of trastuzumab March 2016--  Final echo 10/21/2014 shows a normal EF  (4) left mastectomy  04/12/2014 showed residual mypT1c ypNX invasive ductal carcinoma, grade 2, estrogen and progesterone receptor positive, HER-2 negative, with negative margins  (5) anastrozole started 07/14/2014, completing five years November 2020  (a) bone density 07/29/2014 shows T score -  1.2 (mild osteopenia)  (b) bone density 11/24/2017 showed a T score of  -1.6 osteopenia  (6) 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  (7) mammogram and ultrasound recently showed a  hypoechoic mass at the 7 o'clock position 9 cm from the nipple measuring 8 x 7 x 8 mm and another slightly irregular hypoechoic mass at the 7 o'clock position 10 cm from the nipple measuring 4 x 4 x 5 mm.  Pathology showed low-grade IDC at the 7 o'clock position 9 cm from the nipple and DCIS at the 7 o'clock position 10 cm from the nipple.  She went through right mastectomy and is here to review the Oncotype results and further recommendations.  Oncotype DX result 7, distant risk recurrence at 9 years of 3%, no definitive chemotherapy benefit.  PLAN: We have today discussed about considering antiestrogen therapy with exemestane versus tamoxifen or she could indeed consider anastrozole since she has stopped it almost 5 years ago.  We have discussed about considering it this time for 10 years if possible.  I have ordered another bone density for baseline.  Last bone density showed osteopenia.  We have discussed that there is no clear role for chemotherapy.  She will continue to follow-up with Dr. Leta Baptist for her recent reconstruction procedure.  She wants to follow-up with MD as best as possible, she is okay with seeing the nurse practitioner intermittently when there is a concrete plan in place however she feels more comfortable following up with MD.  She is very thankful we could see her today.  Total time spent: 30 min *Total Encounter Time as defined by the Centers for Medicare and Medicaid Services includes, in addition to the face-to-face time of a patient visit (documented in the note above) non-face-to-face time: obtaining and reviewing outside history, ordering and reviewing medications, tests or procedures, care coordination (communications with other health care professionals or caregivers) and documentation in the medical record.

## 2023-05-10 ENCOUNTER — Encounter (HOSPITAL_BASED_OUTPATIENT_CLINIC_OR_DEPARTMENT_OTHER): Payer: Self-pay | Admitting: Plastic Surgery

## 2023-05-10 ENCOUNTER — Encounter: Payer: Self-pay | Admitting: Hematology and Oncology

## 2023-05-10 ENCOUNTER — Other Ambulatory Visit: Payer: Self-pay

## 2023-05-10 DIAGNOSIS — C50511 Malignant neoplasm of lower-outer quadrant of right female breast: Secondary | ICD-10-CM | POA: Diagnosis not present

## 2023-05-10 DIAGNOSIS — Z923 Personal history of irradiation: Secondary | ICD-10-CM | POA: Diagnosis not present

## 2023-05-10 DIAGNOSIS — Z9013 Acquired absence of bilateral breasts and nipples: Secondary | ICD-10-CM | POA: Diagnosis not present

## 2023-05-10 DIAGNOSIS — Z853 Personal history of malignant neoplasm of breast: Secondary | ICD-10-CM | POA: Diagnosis not present

## 2023-05-10 NOTE — H&P (Signed)
  Subjective Patient ID: Kelsey Mclaughlin is a 62 y.o. female.     HPI   5 weeks post op. Completed excision eschar incision and closure 2 weeks ago. This has dehisced. Denies fevers.   Noted onset right breast retroareolar pain 2024. Diagnostic MMG/US 01/2023 showed  a 0.8 mass at 7 o'clock, 9 cmfn and a 0.5 cm mass 7 o'clock, 10 cmfn. No sonographic abnormalities within the retroareolar right breast noted. Right axilla normal. Biopsies labeled  right, 7 o'clock, 9 cmfn showed IDC with DCIS, ER/PR+, Her 2-, and right breast 7 o'clock, 10 cmfn intermediate grade DCIS. Patient elected for mastectomy. Final pathology right 1.2 cm IDC margins clear 1/3 LN + with micrometastasis. Oncotype 7/3%.   The patient was originally diagnosed at age 58 with IDC in the left UOQ. The patient underwent lumpectomy and full axillary lymph node dissection with ALND, XRT, chemotherapy in [place]. 2/26 LN+.  Screening MMG in December 2014 demonstrated breast asymmetry and follow up biospy demonstrated mass measuring 1 x 0.4 x 1.7 cm 2 cm from the nipple. , IDC, ER/PR +, Her 2 +. She completed neoadjuvant chemotherapy. Completed arimidex course.   She underwent left mastectomy with immediate TE/LD flap reconstruction. Final pathology with two foci IDC 1.3 and 1.0 cm, margins negative. +LVI. Course complicated by seroma and return to OR for drainage and implant exchange. She underwent subsequent Z plasty for treatment left axillary scar (prior lumpectomy ALND scar).   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    She has lymphedema of LUE and uses sleeve and compression pump.    Right mastectomy 661 g   Lives with wife who continues to work for [employer], states doing training now. 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   Review of Systems       Objective Physical Exam  Cardiovascular: Normal rate, regular rhythm and normal heart sounds.    Pulmonary/Chest Effort  normal and breath sounds normal.      Chest: Left chest absent with implant reconstruction, Baker 1 Right chest  vertical incision dehiscence with serous drainage from 1 mm size opening at superior extent of incision No cellulitis no seroma     Assessment/Plan History left breast cancer s/p lumpectomy ALND Adjuvant chemotherapy, left chest radiation Recurrent left breast cancer S/p left mastectomy with latissimus/TE reconstruction S/p silicone implant exchange, lipofilling left chest, and right mastopexy Scar contracture left axilla -s/p Z plasty Right breast ca LOQ ER+ S/p right mastectomy SLN, prepectoral tissue expander reconstruction   Plan OR tomorrow am for debridement and closure wound. Reviewed there is likely some communication with expander cavity given new drainage. Reviewed I will remove fluid from expander and will need to wait several weeks to ensure healing prior to continue with expansion.    Glenna Fellows, MD MBA Plastic & Reconstructive Surgery  Office/ physician access line after hours [phone]    RIGHT Natrelle 133 S FV-13-T 500 ml tissue expander placed  fill volume 335 ml saline  LEFT Mentor Smooth Ultra High Profile 535 ml silicone implant placed in left chest.

## 2023-05-11 ENCOUNTER — Ambulatory Visit (HOSPITAL_BASED_OUTPATIENT_CLINIC_OR_DEPARTMENT_OTHER): Payer: Federal, State, Local not specified - PPO | Admitting: Anesthesiology

## 2023-05-11 ENCOUNTER — Other Ambulatory Visit: Payer: Self-pay

## 2023-05-11 ENCOUNTER — Encounter (HOSPITAL_BASED_OUTPATIENT_CLINIC_OR_DEPARTMENT_OTHER): Payer: Self-pay | Admitting: Plastic Surgery

## 2023-05-11 ENCOUNTER — Encounter (HOSPITAL_BASED_OUTPATIENT_CLINIC_OR_DEPARTMENT_OTHER)
Admission: RE | Disposition: A | Discharge status: Home / Self Care | Payer: Self-pay | Source: Ambulatory Visit | Attending: Plastic Surgery

## 2023-05-11 ENCOUNTER — Ambulatory Visit (HOSPITAL_BASED_OUTPATIENT_CLINIC_OR_DEPARTMENT_OTHER)
Admission: RE | Admit: 2023-05-11 | Discharge: 2023-05-11 | Disposition: A | Discharge status: Home / Self Care | Payer: Federal, State, Local not specified - PPO | Source: Ambulatory Visit | Attending: Plastic Surgery | Admitting: Plastic Surgery

## 2023-05-11 DIAGNOSIS — I10 Essential (primary) hypertension: Secondary | ICD-10-CM

## 2023-05-11 DIAGNOSIS — N6489 Other specified disorders of breast: Secondary | ICD-10-CM | POA: Insufficient documentation

## 2023-05-11 DIAGNOSIS — C50911 Malignant neoplasm of unspecified site of right female breast: Secondary | ICD-10-CM | POA: Diagnosis not present

## 2023-05-11 DIAGNOSIS — Z421 Encounter for breast reconstruction following mastectomy: Secondary | ICD-10-CM | POA: Diagnosis not present

## 2023-05-11 DIAGNOSIS — Z170 Estrogen receptor positive status [ER+]: Secondary | ICD-10-CM | POA: Diagnosis not present

## 2023-05-11 DIAGNOSIS — C50511 Malignant neoplasm of lower-outer quadrant of right female breast: Secondary | ICD-10-CM | POA: Diagnosis not present

## 2023-05-11 DIAGNOSIS — I110 Hypertensive heart disease with heart failure: Secondary | ICD-10-CM | POA: Diagnosis not present

## 2023-05-11 DIAGNOSIS — Z9013 Acquired absence of bilateral breasts and nipples: Secondary | ICD-10-CM | POA: Diagnosis not present

## 2023-05-11 DIAGNOSIS — I509 Heart failure, unspecified: Secondary | ICD-10-CM | POA: Diagnosis not present

## 2023-05-11 DIAGNOSIS — Z8673 Personal history of transient ischemic attack (TIA), and cerebral infarction without residual deficits: Secondary | ICD-10-CM | POA: Diagnosis not present

## 2023-05-11 DIAGNOSIS — Z9221 Personal history of antineoplastic chemotherapy: Secondary | ICD-10-CM | POA: Diagnosis not present

## 2023-05-11 HISTORY — PX: DEBRIDEMENT AND CLOSURE WOUND: SHX5614

## 2023-05-11 SURGERY — DEBRIDEMENT, WOUND, WITH CLOSURE
Anesthesia: General | Site: Chest | Laterality: Right

## 2023-05-11 MED ORDER — FENTANYL CITRATE (PF) 100 MCG/2ML IJ SOLN: 25.0000 ug | INTRAMUSCULAR | Status: DC | PRN

## 2023-05-11 MED ORDER — CEFAZOLIN SODIUM-DEXTROSE 2-4 GM/100ML-% IV SOLN
2.0000 g | INTRAVENOUS | Status: AC
  Administered 2023-05-11: 2 g via INTRAVENOUS

## 2023-05-11 MED ORDER — OXYCODONE HCL 5 MG/5ML PO SOLN: 5.0000 mg | Freq: Once | ORAL | Status: DC | PRN

## 2023-05-11 MED ORDER — FENTANYL CITRATE (PF) 100 MCG/2ML IJ SOLN
INTRAMUSCULAR | Status: DC | PRN
  Administered 2023-05-11 (×2): 50 ug via INTRAVENOUS

## 2023-05-11 MED ORDER — SULFAMETHOXAZOLE-TRIMETHOPRIM 800-160 MG PO TABS: 1.0000 | ORAL_TABLET | Freq: Two times a day (BID) | ORAL | 0 refills | Status: DC

## 2023-05-11 MED ORDER — KETOROLAC TROMETHAMINE 30 MG/ML IJ SOLN
INTRAMUSCULAR | Status: AC
  Filled 2023-05-11: qty 1

## 2023-05-11 MED ORDER — CEFAZOLIN SODIUM 1 G IJ SOLR
INTRAMUSCULAR | Status: AC
  Filled 2023-05-11: qty 20

## 2023-05-11 MED ORDER — PROPOFOL 10 MG/ML IV BOLUS
INTRAVENOUS | Status: AC
  Filled 2023-05-11: qty 20

## 2023-05-11 MED ORDER — LACTATED RINGERS IV SOLN: INTRAVENOUS | Status: DC

## 2023-05-11 MED ORDER — FENTANYL CITRATE (PF) 100 MCG/2ML IJ SOLN
INTRAMUSCULAR | Status: AC
  Filled 2023-05-11: qty 2

## 2023-05-11 MED ORDER — LIDOCAINE HCL (CARDIAC) PF 100 MG/5ML IV SOSY
PREFILLED_SYRINGE | INTRAVENOUS | Status: DC | PRN
  Administered 2023-05-11: 50 mg via INTRAVENOUS

## 2023-05-11 MED ORDER — CHLORHEXIDINE GLUCONATE CLOTH 2 % EX PADS: 6.0000 | MEDICATED_PAD | Freq: Once | CUTANEOUS | Status: DC

## 2023-05-11 MED ORDER — ONDANSETRON HCL 4 MG/2ML IJ SOLN
INTRAMUSCULAR | Status: AC
  Filled 2023-05-11: qty 2

## 2023-05-11 MED ORDER — BUPIVACAINE-EPINEPHRINE (PF) 0.25% -1:200000 IJ SOLN
INTRAMUSCULAR | Status: AC
  Filled 2023-05-11: qty 30

## 2023-05-11 MED ORDER — ONDANSETRON HCL 4 MG/2ML IJ SOLN
INTRAMUSCULAR | Status: DC | PRN
Start: 2023-05-11 — End: 2023-05-11
  Administered 2023-05-11: 4 mg via INTRAVENOUS

## 2023-05-11 MED ORDER — ACETAMINOPHEN 10 MG/ML IV SOLN: 1000.0000 mg | Freq: Once | INTRAVENOUS | Status: DC | PRN

## 2023-05-11 MED ORDER — ONDANSETRON HCL 4 MG/2ML IJ SOLN: 4.0000 mg | Freq: Once | INTRAMUSCULAR | Status: DC | PRN

## 2023-05-11 MED ORDER — SODIUM CHLORIDE 0.9 % IV SOLN
INTRAVENOUS | Status: DC | PRN
  Administered 2023-05-11: 600 mL

## 2023-05-11 MED ORDER — MIDAZOLAM HCL 5 MG/5ML IJ SOLN
INTRAMUSCULAR | Status: DC | PRN
  Administered 2023-05-11: 2 mg via INTRAVENOUS

## 2023-05-11 MED ORDER — KETOROLAC TROMETHAMINE 30 MG/ML IJ SOLN
INTRAMUSCULAR | Status: DC | PRN
  Administered 2023-05-11: 30 mg via INTRAVENOUS

## 2023-05-11 MED ORDER — SODIUM CHLORIDE 0.9 % IV SOLN
INTRAVENOUS | Status: AC
  Filled 2023-05-11: qty 10

## 2023-05-11 MED ORDER — EPHEDRINE 5 MG/ML INJ
INTRAVENOUS | Status: AC
  Filled 2023-05-11: qty 5

## 2023-05-11 MED ORDER — EPHEDRINE SULFATE (PRESSORS) 50 MG/ML IJ SOLN
INTRAMUSCULAR | Status: DC | PRN
  Administered 2023-05-11: 10 mg via INTRAVENOUS

## 2023-05-11 MED ORDER — LIDOCAINE-EPINEPHRINE 1 %-1:100000 IJ SOLN
INTRAMUSCULAR | Status: AC
  Filled 2023-05-11: qty 1

## 2023-05-11 MED ORDER — DEXAMETHASONE SODIUM PHOSPHATE 10 MG/ML IJ SOLN
INTRAMUSCULAR | Status: AC
  Filled 2023-05-11: qty 1

## 2023-05-11 MED ORDER — DEXAMETHASONE SODIUM PHOSPHATE 4 MG/ML IJ SOLN
INTRAMUSCULAR | Status: DC | PRN
  Administered 2023-05-11: 5 mg via INTRAVENOUS

## 2023-05-11 MED ORDER — PROPOFOL 500 MG/50ML IV EMUL
INTRAVENOUS | Status: DC | PRN
  Administered 2023-05-11: 150 ug/kg/min via INTRAVENOUS

## 2023-05-11 MED ORDER — LIDOCAINE 2% (20 MG/ML) 5 ML SYRINGE
INTRAMUSCULAR | Status: AC
  Filled 2023-05-11: qty 5

## 2023-05-11 MED ORDER — MIDAZOLAM HCL 2 MG/2ML IJ SOLN
INTRAMUSCULAR | Status: AC
  Filled 2023-05-11: qty 2

## 2023-05-11 MED ORDER — BACITRACIN ZINC 500 UNIT/GM EX OINT
TOPICAL_OINTMENT | CUTANEOUS | Status: AC
  Filled 2023-05-11: qty 28.35

## 2023-05-11 MED ORDER — PROPOFOL 10 MG/ML IV BOLUS
INTRAVENOUS | Status: DC | PRN
Start: 2023-05-11 — End: 2023-05-11
  Administered 2023-05-11: 150 mg via INTRAVENOUS

## 2023-05-11 MED ORDER — OXYCODONE HCL 5 MG PO TABS: 5.0000 mg | ORAL_TABLET | Freq: Once | ORAL | Status: DC | PRN

## 2023-05-11 SURGICAL SUPPLY — 50 items
ADH SKN CLS APL DERMABOND .7 (GAUZE/BANDAGES/DRESSINGS)
APL SKNCLS STERI-STRIP NONHPOA (GAUZE/BANDAGES/DRESSINGS)
BENZOIN TINCTURE PRP APPL 2/3 (GAUZE/BANDAGES/DRESSINGS) IMPLANT
BINDER BREAST XLRG (GAUZE/BANDAGES/DRESSINGS) IMPLANT
BLADE CLIPPER SURG (BLADE) IMPLANT
BLADE SURG 10 STRL SS (BLADE) IMPLANT
BLADE SURG 15 STRL LF DISP TIS (BLADE) ×1 IMPLANT
BLADE SURG 15 STRL SS (BLADE) ×1
CANISTER SUCT 1200ML W/VALVE (MISCELLANEOUS) IMPLANT
COVER BACK TABLE 60X90IN (DRAPES) ×1 IMPLANT
COVER MAYO STAND STRL (DRAPES) ×1 IMPLANT
DERMABOND ADVANCED .7 DNX12 (GAUZE/BANDAGES/DRESSINGS) IMPLANT
DRAPE LAPAROSCOPIC ABDOMINAL (DRAPES) IMPLANT
DRAPE TOP ARMCOVERS (MISCELLANEOUS) IMPLANT
DRAPE U-SHAPE 76X120 STRL (DRAPES) ×1 IMPLANT
DRAPE UTILITY XL STRL (DRAPES) ×1 IMPLANT
ELECT COATED BLADE 2.86 ST (ELECTRODE) IMPLANT
ELECT REM PT RETURN 9FT ADLT (ELECTROSURGICAL) ×1
ELECT REM PT RETURN 9FT PED (ELECTROSURGICAL)
ELECTRODE REM PT RETRN 9FT PED (ELECTROSURGICAL) IMPLANT
ELECTRODE REM PT RTRN 9FT ADLT (ELECTROSURGICAL) IMPLANT
GAUZE SPONGE 4X4 12PLY STRL LF (GAUZE/BANDAGES/DRESSINGS) IMPLANT
GLOVE BIO SURGEON STRL SZ 6 (GLOVE) ×1 IMPLANT
GLOVE BIO SURGEON STRL SZ 6.5 (GLOVE) IMPLANT
GLOVE BIOGEL PI IND STRL 6.5 (GLOVE) IMPLANT
GOWN STRL REUS W/ TWL LRG LVL3 (GOWN DISPOSABLE) ×2 IMPLANT
GOWN STRL REUS W/TWL LRG LVL3 (GOWN DISPOSABLE) ×2
NDL HYPO 27GX1-1/4 (NEEDLE) ×1 IMPLANT
NEEDLE HYPO 27GX1-1/4 (NEEDLE) ×1 IMPLANT
NS IRRIG 1000ML POUR BTL (IV SOLUTION) IMPLANT
PACK BASIN DAY SURGERY FS (CUSTOM PROCEDURE TRAY) ×1 IMPLANT
PENCIL SMOKE EVACUATOR (MISCELLANEOUS) IMPLANT
SHEET MEDIUM DRAPE 40X70 STRL (DRAPES) IMPLANT
SLEEVE SCD COMPRESS KNEE MED (STOCKING) ×1 IMPLANT
SPONGE T-LAP 18X18 ~~LOC~~+RFID (SPONGE) IMPLANT
STAPLER VISISTAT 35W (STAPLE) IMPLANT
STRIP CLOSURE SKIN 1/2X4 (GAUZE/BANDAGES/DRESSINGS) IMPLANT
SUCTION TUBE FRAZIER 10FR DISP (SUCTIONS) IMPLANT
SUT CHROMIC 4 0 PS 2 18 (SUTURE) IMPLANT
SUT ETHILON 4 0 PS 2 18 (SUTURE) IMPLANT
SUT MNCRL AB 4-0 PS2 18 (SUTURE) IMPLANT
SUT NYLON ETHILON 5-0 P-3 1X18 (SUTURE) IMPLANT
SUT PLAIN 5 0 P 3 18 (SUTURE) IMPLANT
SUT VIC AB 4-0 PS2 18 (SUTURE) IMPLANT
SYR BULB IRRIG 60ML STRL (SYRINGE) IMPLANT
SYR CONTROL 10ML LL (SYRINGE) ×1 IMPLANT
TOWEL GREEN STERILE FF (TOWEL DISPOSABLE) ×1 IMPLANT
TRAY DSU PREP LF (CUSTOM PROCEDURE TRAY) ×1 IMPLANT
TUBE CONNECTING 20X1/4 (TUBING) IMPLANT
YANKAUER SUCT BULB TIP NO VENT (SUCTIONS) IMPLANT

## 2023-05-11 NOTE — Anesthesia Postprocedure Evaluation (Signed)
Anesthesia Post Note  Patient: Kelsey Mclaughlin  Procedure(s) Performed: DEBRIDEMENT AND CLOSURE WOUND  RIGHT MASTECTOMY FLAP (Right: Chest)     Patient location during evaluation: PACU Anesthesia Type: General Level of consciousness: awake and alert Pain management: pain level controlled Vital Signs Assessment: post-procedure vital signs reviewed and stable Respiratory status: spontaneous breathing, nonlabored ventilation, respiratory function stable and patient connected to nasal cannula oxygen Cardiovascular status: blood pressure returned to baseline and stable Postop Assessment: no apparent nausea or vomiting Anesthetic complications: no   No notable events documented.  Last Vitals:  Vitals:   05/11/23 0830 05/11/23 0845  BP: 120/66 113/63  Pulse: 62 60  Resp: 14 14  Temp:    SpO2: 100% 100%    Last Pain:  Vitals:   05/11/23 0830  TempSrc:   PainSc: 0-No pain                 Mariann Barter

## 2023-05-11 NOTE — Anesthesia Preprocedure Evaluation (Signed)
Anesthesia Evaluation  Patient identified by MRN, date of birth, ID band Patient awake    Reviewed: Allergy & Precautions, NPO status , Patient's Chart, lab work & pertinent test results, reviewed documented beta blocker date and time   History of Anesthesia Complications Negative for: history of anesthetic complications  Airway Mallampati: II  TM Distance: >3 FB Neck ROM: Full    Dental no notable dental hx.    Pulmonary neg shortness of breath, neg COPD, neg PE   breath sounds clear to auscultation       Cardiovascular hypertension, (-) angina +CHF  (-) CAD (-) Valvular Problems/Murmurs Rhythm:Regular Rate:Normal     Neuro/Psych neg Seizures PSYCHIATRIC DISORDERS  Depression    TIACVA, No Residual Symptoms    GI/Hepatic ,neg GERD  ,,(+) neg Cirrhosis        Endo/Other  neg diabetes    Renal/GU Renal disease     Musculoskeletal   Abdominal   Peds  Hematology   Anesthesia Other Findings   Reproductive/Obstetrics                              Anesthesia Physical Anesthesia Plan  ASA: 2  Anesthesia Plan: General   Post-op Pain Management:    Induction:   PONV Risk Score and Plan: 2 and TIVA and Ondansetron  Airway Management Planned: LMA  Additional Equipment:   Intra-op Plan:   Post-operative Plan: Extubation in OR  Informed Consent: I have reviewed the patients History and Physical, chart, labs and discussed the procedure including the risks, benefits and alternatives for the proposed anesthesia with the patient or authorized representative who has indicated his/her understanding and acceptance.     Dental advisory given  Plan Discussed with: CRNA  Anesthesia Plan Comments:          Anesthesia Quick Evaluation

## 2023-05-11 NOTE — Interval H&P Note (Signed)
History and Physical Interval Note:  05/11/2023 6:59 AM  Kelsey Mclaughlin  has presented today for surgery, with the diagnosis of right breast cancer LOQ ER acquired absence breasts open wound right chest.  The various methods of treatment have been discussed with the patient and family. After consideration of risks, benefits and other options for treatment, the patient has consented to  Procedure(s): DEBRIDEMENT AND CLOSURE WOUND  RIGHT MASTECTOMY FLAP (Right) as a surgical intervention.  The patient's history has been reviewed, patient examined, no change in status, stable for surgery.  I have reviewed the patient's chart and labs.  Questions were answered to the patient's satisfaction.     Irean Hong Chamaine Stankus

## 2023-05-11 NOTE — Anesthesia Procedure Notes (Signed)
Procedure Name: LMA Insertion Date/Time: 05/11/2023 7:27 AM  Performed by: Cleda Clarks, CRNAPre-anesthesia Checklist: Patient identified, Emergency Drugs available, Suction available and Patient being monitored Patient Re-evaluated:Patient Re-evaluated prior to induction Oxygen Delivery Method: Circle system utilized Preoxygenation: Pre-oxygenation with 100% oxygen Induction Type: IV induction Ventilation: Mask ventilation without difficulty LMA: LMA inserted LMA Size: 4.0 Number of attempts: 1 Placement Confirmation: positive ETCO2 Tube secured with: Tape Dental Injury: Teeth and Oropharynx as per pre-operative assessment

## 2023-05-11 NOTE — Op Note (Signed)
Operative Note   DATE OF OPERATION: 8.28.24  LOCATION: [facility]-outpatient  SURGICAL DIVISION: Plastic Surgery  PREOPERATIVE DIAGNOSES:  1. Right breast cancer LOQ ER+ 2. Acquired absence breast 3. Open wound right chest  POSTOPERATIVE DIAGNOSES:  same  PROCEDURE:  Complex repair right chest 9 cm  SURGEON: Glenna Fellows MD MBA  ASSISTANT: none  ANESTHESIA:  General.   EBL: 10 ml  COMPLICATIONS: None immediate.   INDICATIONS FOR PROCEDURE:  The patient, Kelsey Mclaughlin, is a 62 y.o. female born on 10/18/1960, is here for treatment mastectomy flap incisional necrosis and dehiscence following skin reduction pattern mastectomy and immediate expander based reconstruction.   FINDINGS: Following debridement wound, exposure tissue expander noted. 100 ml saline removed from expander to aid with tension free closure.  DESCRIPTION OF PROCEDURE:  The patient's operative site was marked with the patient in the preoperative area. The patient was taken to the operating room. SCDs were placed and IV antibiotics were given. The patient's operative site was prepped and draped in a sterile fashion. A time out was performed and all information was confirmed to be correct. Sharp excision wound margins surrounding vertical portion scar completed with knife. Slough base wound removed with knife and scissors. Wound irrigated with saline solution containing Ancef gentamicin and betadine. Expander port accessed and 100 ml saline removed. Layered closure completed with 3-0 monocryl in dermis and interrupted 4-0 nylon suture for skin closure length repair 9 cm. Steris applied followed by dry dressing and breast binder  The patient was allowed to wake from anesthesia, extubated and taken to the recovery room in satisfactory condition.   SPECIMENS: none  DRAINS: none  Glenna Fellows, MD [facility]  Office/ physician access line after hours [phone]

## 2023-05-11 NOTE — Transfer of Care (Signed)
Immediate Anesthesia Transfer of Care Note  Patient: Kelsey Mclaughlin  Procedure(s) Performed: DEBRIDEMENT AND CLOSURE WOUND  RIGHT MASTECTOMY FLAP (Right: Chest)  Patient Location: PACU  Anesthesia Type:General  Level of Consciousness: awake, alert , and oriented  Airway & Oxygen Therapy: Patient Spontanous Breathing and Patient connected to face mask oxygen  Post-op Assessment: Report given to RN and Post -op Vital signs reviewed and stable  Post vital signs: Reviewed and stable  Last Vitals:  Vitals Value Taken Time  BP 125/69 05/11/23 0822  Temp 36.2 C 05/11/23 0822  Pulse 71 05/11/23 0823  Resp 14 05/11/23 0823  SpO2 100 % 05/11/23 0823  Vitals shown include unfiled device data.  Last Pain:  Vitals:   05/11/23 0625  TempSrc: Oral  PainSc: 0-No pain      Patients Stated Pain Goal: 3 (05/11/23 3875)  Complications: No notable events documented.

## 2023-05-11 NOTE — Discharge Instructions (Signed)
You may have NSAIDS (Ibuprofen/Motrin/Aleve) after 4pm today, if needed.  Post Anesthesia Home Care Instructions  Activity: Get plenty of rest for the remainder of the day. A responsible individual must stay with you for 24 hours following the procedure.  For the next 24 hours, DO NOT: -Drive a car -Advertising copywriter -Drink alcoholic beverages -Take any medication unless instructed by your physician -Make any legal decisions or sign important papers.  Meals: Start with liquid foods such as gelatin or soup. Progress to regular foods as tolerated. Avoid greasy, spicy, heavy foods. If nausea and/or vomiting occur, drink only clear liquids until the nausea and/or vomiting subsides. Call your physician if vomiting continues.  Special Instructions/Symptoms: Your throat may feel dry or sore from the anesthesia or the breathing tube placed in your throat during surgery. If this causes discomfort, gargle with warm salt water. The discomfort should disappear within 24 hours.  If you had a scopolamine patch placed behind your ear for the management of post- operative nausea and/or vomiting:  1. The medication in the patch is effective for 72 hours, after which it should be removed.  Wrap patch in a tissue and discard in the trash. Wash hands thoroughly with soap and water. 2. You may remove the patch earlier than 72 hours if you experience unpleasant side effects which may include dry mouth, dizziness or visual disturbances. 3. Avoid touching the patch. Wash your hands with soap and water after contact with the patch.

## 2023-05-12 ENCOUNTER — Encounter (HOSPITAL_BASED_OUTPATIENT_CLINIC_OR_DEPARTMENT_OTHER): Payer: Self-pay | Admitting: Plastic Surgery

## 2023-05-23 ENCOUNTER — Ambulatory Visit: Payer: Federal, State, Local not specified - PPO | Admitting: Adult Health

## 2023-05-30 DIAGNOSIS — Z923 Personal history of irradiation: Secondary | ICD-10-CM | POA: Diagnosis not present

## 2023-05-30 DIAGNOSIS — C50511 Malignant neoplasm of lower-outer quadrant of right female breast: Secondary | ICD-10-CM | POA: Diagnosis not present

## 2023-05-30 DIAGNOSIS — Z9013 Acquired absence of bilateral breasts and nipples: Secondary | ICD-10-CM | POA: Diagnosis not present

## 2023-05-30 DIAGNOSIS — Z170 Estrogen receptor positive status [ER+]: Secondary | ICD-10-CM | POA: Diagnosis not present

## 2023-06-03 ENCOUNTER — Ambulatory Visit: Payer: Federal, State, Local not specified - PPO | Admitting: Hematology and Oncology

## 2023-06-20 DIAGNOSIS — C50511 Malignant neoplasm of lower-outer quadrant of right female breast: Secondary | ICD-10-CM | POA: Diagnosis not present

## 2023-06-20 DIAGNOSIS — Z9013 Acquired absence of bilateral breasts and nipples: Secondary | ICD-10-CM | POA: Diagnosis not present

## 2023-06-20 DIAGNOSIS — Z853 Personal history of malignant neoplasm of breast: Secondary | ICD-10-CM | POA: Diagnosis not present

## 2023-06-20 DIAGNOSIS — Z923 Personal history of irradiation: Secondary | ICD-10-CM | POA: Diagnosis not present

## 2023-07-14 DIAGNOSIS — Z853 Personal history of malignant neoplasm of breast: Secondary | ICD-10-CM | POA: Diagnosis not present

## 2023-07-14 DIAGNOSIS — Z9013 Acquired absence of bilateral breasts and nipples: Secondary | ICD-10-CM | POA: Diagnosis not present

## 2023-07-14 DIAGNOSIS — C50511 Malignant neoplasm of lower-outer quadrant of right female breast: Secondary | ICD-10-CM | POA: Diagnosis not present

## 2023-07-14 DIAGNOSIS — Z923 Personal history of irradiation: Secondary | ICD-10-CM | POA: Diagnosis not present

## 2023-08-01 ENCOUNTER — Encounter (HOSPITAL_BASED_OUTPATIENT_CLINIC_OR_DEPARTMENT_OTHER): Payer: Self-pay | Admitting: Plastic Surgery

## 2023-08-04 ENCOUNTER — Encounter (HOSPITAL_BASED_OUTPATIENT_CLINIC_OR_DEPARTMENT_OTHER)
Admission: RE | Admit: 2023-08-04 | Discharge: 2023-08-04 | Disposition: A | Discharge status: Home / Self Care | Payer: Federal, State, Local not specified - PPO | Source: Ambulatory Visit | Attending: Plastic Surgery | Admitting: Plastic Surgery

## 2023-08-04 ENCOUNTER — Inpatient Hospital Stay
Payer: Federal, State, Local not specified - PPO | Attending: Hematology and Oncology | Admitting: Hematology and Oncology

## 2023-08-04 VITALS — BP 122/68 | HR 65 | Temp 97.9°F | Resp 18 | Wt 188.0 lb

## 2023-08-04 DIAGNOSIS — I890 Lymphedema, not elsewhere classified: Secondary | ICD-10-CM | POA: Insufficient documentation

## 2023-08-04 DIAGNOSIS — M8580 Other specified disorders of bone density and structure, unspecified site: Secondary | ICD-10-CM | POA: Insufficient documentation

## 2023-08-04 DIAGNOSIS — I10 Essential (primary) hypertension: Secondary | ICD-10-CM | POA: Insufficient documentation

## 2023-08-04 DIAGNOSIS — Z01818 Encounter for other preprocedural examination: Secondary | ICD-10-CM | POA: Diagnosis not present

## 2023-08-04 DIAGNOSIS — Z170 Estrogen receptor positive status [ER+]: Secondary | ICD-10-CM | POA: Insufficient documentation

## 2023-08-04 DIAGNOSIS — Z79811 Long term (current) use of aromatase inhibitors: Secondary | ICD-10-CM | POA: Insufficient documentation

## 2023-08-04 DIAGNOSIS — Z9013 Acquired absence of bilateral breasts and nipples: Secondary | ICD-10-CM | POA: Insufficient documentation

## 2023-08-04 DIAGNOSIS — Z923 Personal history of irradiation: Secondary | ICD-10-CM | POA: Diagnosis not present

## 2023-08-04 DIAGNOSIS — Z01812 Encounter for preprocedural laboratory examination: Secondary | ICD-10-CM | POA: Insufficient documentation

## 2023-08-04 DIAGNOSIS — C50412 Malignant neoplasm of upper-outer quadrant of left female breast: Secondary | ICD-10-CM | POA: Insufficient documentation

## 2023-08-04 DIAGNOSIS — Z9221 Personal history of antineoplastic chemotherapy: Secondary | ICD-10-CM | POA: Insufficient documentation

## 2023-08-04 DIAGNOSIS — Z79899 Other long term (current) drug therapy: Secondary | ICD-10-CM | POA: Diagnosis not present

## 2023-08-04 LAB — BASIC METABOLIC PANEL
Anion gap: 8 (ref 5–15)
BUN: 9 mg/dL (ref 8–23)
CO2: 25 mmol/L (ref 22–32)
Calcium: 9.6 mg/dL (ref 8.9–10.3)
Chloride: 106 mmol/L (ref 98–111)
Creatinine, Ser: 0.83 mg/dL (ref 0.44–1.00)
GFR, Estimated: >60 mL/min (ref >60)
Glucose, Bld: 115 mg/dL — ABNORMAL HIGH (ref 70–99)
Potassium: 3.5 mmol/L (ref 3.5–5.1)
Sodium: 139 mmol/L (ref 135–145)

## 2023-08-04 MED ORDER — CHLORHEXIDINE GLUCONATE CLOTH 2 % EX PADS: 6.0000 | MEDICATED_PAD | Freq: Once | CUTANEOUS | Status: DC

## 2023-08-04 NOTE — Progress Notes (Signed)
[facility]  [phone] [phone]     ID: Kelsey Mclaughlin OB: 19-Nov-1960  MR#: [id number]  JYN#:[id number]  Patient Care Team: Kelsey James, MD as PCP - General (Family Medicine) Kelsey Flake, MD as Referring Physician (Family Medicine) Kelsey Fellows, MD as Consulting Physician (Plastic Surgery) Kelsey Bustard, MD as Referring Physician (Obstetrics and Gynecology) Kelsey Moulds, MD as Consulting Physician (Hematology and Oncology) OTHER MD: Kelsey Paci MD, Kelsey Evener MD  CHIEF COMPLAINT: Breast cancer, recurrent, estrogen receptor positive  CURRENT TREATMENT: observation  INTERVAL HISTORY:  Discussed the use of AI scribe software for clinical note transcription with the patient, who gave verbal consent to proceed.  History of Present Illness         The patient, with a history of bilateral mastectomy and lymphedema, presents for a follow-up on exemestane. Initially, she experienced daily headaches for the first three weeks, which have since resolved. She denies hot flashes and bone pain. The patient's wife reports no mood swings.  The patient also has an ongoing issue with a breast wound that is not healing well. She has had two corrective surgeries and is scheduled for a third. The wound is currently open and leaking. The patient's lymphedema is being managed, and she plans to seek treatment at [facility] for lymphedema surgery once the wound issue is resolved.   Rest of the pertinent 10 point ROS reviewed and negative   COVID 19 VACCINATION STATUS: fully vaccinated +1 booster   BREAST CANCER HISTORY: From doctor Kelsey Mclaughlin's intake note 01/30/2014:  "Kelsey Mclaughlin is a 62 y.o. female. In 1998 at the age of 16 was diagnosed with left breast cancer in [place]. This was an invasive carcinoma grade 2 stage II. At that time she underwent a lumpectomy with excellent lymph node dissection. She received 6 months of  chemotherapy. [name] She also had radiation therapy to the left breast. 2011 patient noticed a lump in the outside of her left breast. She had ultrasound workup performed and was told it was negative no biopsies were performed. General 2014 after patient moved to the [place] she had a mammogram performed this revealed a possible mass in the outer quadrant of the left breast. Ultrasound showed it to be 1.7 cm. MRI of the breasts performed on January 30 revealed the mass to be 1.5 x 2.2 x 1.5 cm. Because of this she also had a biopsy performed on 10/04/2013. The biopsy revealed [[id number]) an invasive ductal carcinoma with ductal carcinoma in situ grade 2. Prognostic panel was positive for estrogen receptor 100% megestrol receptor +33% proliferation marker Ki-67 15% and the tumor was HER-2/neu positive" [with a signals ratio of 3.1 and number per cell 6.3]  The patient's subsequent history is as detailed below   PAST MEDICAL HISTORY: Past Medical History:  Diagnosis Date   Bronchitis    completed antibiotics 10/21/13/ states resolved   Cancer (HCC) 1998, 2015   Left Breast   Chronic diastolic (congestive) heart failure (HCC)    Fatty liver    Hypertension    Lymph edema    lt arm   Stroke (HCC) 2004   TIA-  no problems since    PAST SURGICAL HISTORY: Past Surgical History:  Procedure Laterality Date   ABDOMINOPLASTY/PANNICULECTOMY     BREAST BIOPSY Right 02/16/2023   [place] RT BREAST BX W LOC DEV EA ADD LESION IMG BX  SPEC US GUIDE 02/16/2023 [facility]   BREAST BIOPSY Right 02/16/2023   [place] RT BREAST BX W LOC DEV 1ST LESION IMG BX SPEC US GUIDE 02/16/2023 [facility]AMMO[facility]   BREAST IMPLANT EXCHANGE Left 09/03/2014   Procedure: LEFT REMOVAL TISSUE EXPANDERS WITH PLACEMENT OF SILICONE IMPLANT ;  Surgeon: Kelsey Fellows, MD;  Location: [facility];  Service: Plastics;  Laterality: Left;   BREAST RECONSTRUCTION  Left 03/07/2015   Procedure: LEFT NIPPLE AEROLA CREATION WITH LOCAL FLAP/FULL THICKNESS SKIN GRAFT FROM GROIN;  Surgeon: Kelsey Fellows, MD;  Location: [facility];  Service: Plastics;  Laterality: Left;   BREAST RECONSTRUCTION WITH PLACEMENT OF TISSUE EXPANDER AND ALLODERM Right 04/05/2023   Procedure: BREAST RECONSTRUCTION WITH PLACEMENT OF TISSUE EXPANDER;  Surgeon: Kelsey Fellows, MD;  Location: [facility];  Service: Plastics;  Laterality: Right;   BREAST SURGERY  1998   lumpectomy on left and removed lymphnodes in [place]   BUNIONECTOMY Left 07/31/2020   Procedure: Left Lapidus and Modified Orpha Bur;  Surgeon: Kelsey Arthurs, MD;  Location: [facility];  Service: Orthopedics;  Laterality: Left;   DEBRIDEMENT AND CLOSURE WOUND Right 05/11/2023   Procedure: DEBRIDEMENT AND CLOSURE WOUND  RIGHT MASTECTOMY FLAP;  Surgeon: Kelsey Fellows, MD;  Location: [facility];  Service: Plastics;  Laterality: Right;   HYDRADENITIS EXCISION Left 05/30/2018   Procedure: Adjacent tissue transfer left axilla;  Surgeon: Kelsey Fellows, MD;  Location: [facility];  Service: Plastics;  Laterality: Left;   LATISSIMUS FLAP TO BREAST Left 04/12/2014   Procedure: LEFT LATISSIMUS DORSI FLAP FOR LEFT BREAST RECONSTRUCTION AND PLACEMENT OF TISSUE EXPANDER;  Surgeon: Kelsey Fellows, MD;  Location: [facility];  Service: Plastics;  Laterality: Left;   LIPOSUCTION WITH LIPOFILLING Left 09/03/2014   Procedure:  LIPOFILLING TO LEFT CHEST  ;  Surgeon: Kelsey Fellows, MD;  Location: [facility];  Service: Plastics;  Laterality: Left;   LIPOSUCTION WITH LIPOFILLING Left 03/07/2015   Procedure: LIPOFILLING TO LEFT BREAST;  Surgeon: Kelsey Fellows, MD;  Location: [facility];  Service: Plastics;  Laterality: Left;   MASTECTOMY Left 2015   MASTECTOMY W/ SENTINEL NODE BIOPSY Right 04/05/2023   Procedure: RIGHT MASTECTOMY  WITH SENTINEL LYMPH NODE BIOPSY;  Surgeon: Kelsey Rudd, MD;  Location: [facility];  Service: General;  Laterality: Right;  PEC BLOCK   MASTOPEXY Right 09/03/2014   Procedure: RIGHT BREAST MASTOPEXY ;  Surgeon: Kelsey Fellows, MD;  Location:  SURGERY CENTER;  Service: Plastics;  Laterality: Right;   PORT-A-CATH REMOVAL Right 12/24/2014   Procedure: REMOVAL PORT-A-CATH;  Surgeon: Kelsey Rudd, MD;  Location:  SURGERY CENTER;  Service: General;  Laterality: Right;   PORTACATH PLACEMENT Right 10/25/2013   Procedure: INSERTION PORT-A-CATH;  Surgeon: Wilmon Arms. Corliss Skains, MD;  Location: [facility];  Service: General;  Laterality: Right;   REDUCTION MAMMAPLASTY Right    SIMPLE MASTECTOMY WITH AXILLARY SENTINEL NODE BIOPSY Left 04/12/2014   Procedure: MASTECTOMY;  Surgeon: Wilmon Arms. Corliss Skains, MD;  Location: [facility];  Service: General;  Laterality: Left;   TISSUE EXPANDER PLACEMENT Left 04/12/2014   Procedure: TISSUE EXPANDER;  Surgeon: Kelsey Fellows, MD;  Location: [facility];  Service: Plastics;  Laterality: Left;   TISSUE EXPANDER PLACEMENT Left 05/22/2014   Procedure: PLACEMENT OF TISSUE EXPANDER/I&D WASHOUT SEROMA;  Surgeon: Kelsey Fellows, MD;  Location: [facility];  Service: Plastics;  Laterality: Left;    FAMILY HISTORY Family History  Problem Relation Age  of Onset   Breast cancer Mother 47   Heart disease Father    Heart attack Father    Breast cancer Maternal Grandmother 93   Brain cancer Maternal Uncle 74       benign brain tumor   Heart attack Paternal Aunt     GYNECOLOGIC HISTORY:   menarche age 4, first live birth age 64. The patient is GX P2. She went through the change of life approximately 2011.    SOCIAL HISTORY:  The patient worked previously as a Engineer, civil (consulting). She has 2 children both of whom live in [place] -- Theron Arista, works for the [employer], and [employer], a Occupational psychologist. There are no grandchildren. The patient married [name] (both women are originally  from [place]) in [place] 2011. [place] with 5 dogs, 40+ chickens and 4 horses. They're not church attenders    ADVANCED DIRECTIVES: in place   HEALTH MAINTENANCE: Social History   Tobacco Use   Smoking status: Never   Smokeless tobacco: Never  Substance Use Topics   Alcohol use: Yes    Comment: 2 drinks/day   Drug use: No     Colonoscopy:  PAP: May 2017    Bone density:  11/24/2017 showed a T score of  -1.6 osteopenia  Lipid panel:  Allergies  Allergen Reactions   Adhesive [Tape] Rash    Band-aids   Wound Dressing Adhesive Rash    Dermabond    Current Outpatient Medications  Medication Sig Dispense Refill   buPROPion (WELLBUTRIN XL) 300 MG 24 hr tablet Take 300 mg by mouth daily.     cholecalciferol (VITAMIN D) 25 MCG (1000 UT) tablet TAKE 1 TABLET BY MOUTH EVERY DAY 100 tablet 4   exemestane (AROMASIN) 25 MG tablet Take 1 tablet (25 mg total) by mouth daily after breakfast. 90 tablet 3   losartan-hydrochlorothiazide (HYZAAR) 100-12.5 MG tablet      sulfamethoxazole-trimethoprim (BACTRIM DS) 800-160 MG tablet Take 1 tablet by mouth 2 (two) times daily. 14 tablet 0   Vilazodone HCl (VIIBRYD) 10 MG TABS Take 10 mg by mouth daily.     No current facility-administered medications for this visit.   Facility-Administered Medications Ordered in Other Visits  Medication Dose Route Frequency Provider Last Rate Last Admin   Chlorhexidine Gluconate Cloth 2 % PADS 6 each  6 each Topical Once Thimmappa, Irean Hong, MD       And   Chlorhexidine Gluconate Cloth 2 % PADS 6 each  6 each Topical Once Kelsey Fellows, MD        OBJECTIVE: White woman who appears younger than stated age  Vitals:   08/04/23 1322  BP: 122/68  Pulse: 65  Resp: 18  Temp: 97.9 F (36.6 C)  SpO2: 98%       Body mass index is 30.34 kg/m.    Filed Weights   08/04/23 1322  Weight: 188 lb (85.3 kg)     ECOG FS:1 - Symptomatic but completely ambulatory   Physical  Exam Constitutional:      Appearance: Normal appearance.  Chest:     Comments: She is status post bilateral mastectomy, there is a small area of open wound with necrotic tissue, circular in the place of previous nipple.  She is going for another surgery with plastic surgery.  No palpable masses otherwise.  No regional adenopathy Musculoskeletal:        General: No swelling or tenderness. Normal range of motion.     Cervical back:  Normal range of motion and neck supple. No rigidity.  Lymphadenopathy:     Cervical: No cervical adenopathy.  Skin:    General: Skin is warm and dry.  Neurological:     General: No focal deficit present.     Mental Status: She is alert.      LAB RESULTS:  CMP     Component Value Date/Time   NA 139 08/04/2023 1145   NA 140 02/16/2017 1323   K 3.5 08/04/2023 1145   K 3.7 02/16/2017 1323   CL 106 08/04/2023 1145   CO2 25 08/04/2023 1145   CO2 26 02/16/2017 1323   GLUCOSE 115 (H) 08/04/2023 1145   GLUCOSE 113 02/16/2017 1323   BUN 9 08/04/2023 1145   BUN 12.8 02/16/2017 1323   CREATININE 0.83 08/04/2023 1145   CREATININE 0.72 06/02/2022 1446   CREATININE 0.8 02/16/2017 1323   CALCIUM 9.6 08/04/2023 1145   CALCIUM 9.7 02/16/2017 1323   PROT 6.4 (L) 03/31/2023 1331   PROT 6.9 02/16/2017 1323   ALBUMIN 3.9 03/31/2023 1331   ALBUMIN 4.1 02/16/2017 1323   AST 24 03/31/2023 1331   AST 31 06/02/2022 1446   AST 34 02/16/2017 1323   ALT 36 03/31/2023 1331   ALT 51 (H) 06/02/2022 1446   ALT 58 (H) 02/16/2017 1323   ALKPHOS 64 03/31/2023 1331   ALKPHOS 107 02/16/2017 1323   BILITOT 0.5 03/31/2023 1331   BILITOT 0.3 06/02/2022 1446   BILITOT 0.28 02/16/2017 1323   GFRNONAA >60 08/04/2023 1145   GFRNONAA >60 06/02/2022 1446   GFRAA >60 05/01/2020 1353    I No results found for: "SPEP", "UPEP"  Lab Results  Component Value Date   WBC 8.1 06/02/2022   NEUTROABS 5.3 06/02/2022   HGB 13.4 06/02/2022   HCT 39.3 06/02/2022   MCV 89.9 06/02/2022    PLT 300 06/02/2022      Chemistry      Component Value Date/Time   NA 139 08/04/2023 1145   NA 140 02/16/2017 1323   K 3.5 08/04/2023 1145   K 3.7 02/16/2017 1323   CL 106 08/04/2023 1145   CO2 25 08/04/2023 1145   CO2 26 02/16/2017 1323   BUN 9 08/04/2023 1145   BUN 12.8 02/16/2017 1323   CREATININE 0.83 08/04/2023 1145   CREATININE 0.72 06/02/2022 1446   CREATININE 0.8 02/16/2017 1323      Component Value Date/Time   CALCIUM 9.6 08/04/2023 1145   CALCIUM 9.7 02/16/2017 1323   ALKPHOS 64 03/31/2023 1331   ALKPHOS 107 02/16/2017 1323   AST 24 03/31/2023 1331   AST 31 06/02/2022 1446   AST 34 02/16/2017 1323   ALT 36 03/31/2023 1331   ALT 51 (H) 06/02/2022 1446   ALT 58 (H) 02/16/2017 1323   BILITOT 0.5 03/31/2023 1331   BILITOT 0.3 06/02/2022 1446   BILITOT 0.28 02/16/2017 1323       No results found for: "LABCA2"  No components found for: "LABCA125"  No results for input(s): "INR" in the last 168 hours.  Urinalysis    Component Value Date/Time   COLORURINE YELLOW 04/05/2016 2040   APPEARANCEUR CLEAR 04/05/2016 2040   LABSPEC 1.027 04/05/2016 2040   LABSPEC 1.005 05/22/2014 1130   PHURINE 7.0 04/05/2016 2040   GLUCOSEU NEGATIVE 04/05/2016 2040   GLUCOSEU Negative 05/22/2014 1130   HGBUR NEGATIVE 04/05/2016 2040   BILIRUBINUR NEGATIVE 04/05/2016 2040   BILIRUBINUR Negative 05/22/2014 1130   KETONESUR NEGATIVE 04/05/2016 2040  PROTEINUR 100 (A) 04/05/2016 2040   UROBILINOGEN 0.2 05/22/2014 1130   NITRITE NEGATIVE 04/05/2016 2040   LEUKOCYTESUR NEGATIVE 04/05/2016 2040   LEUKOCYTESUR Negative 05/22/2014 1130    STUDIES: No results found.   ASSESSMENT: 62 y.o. BRCA negative [place] woman, [place] speaker   (1) status post left lumpectomy and axillary lymph node dissection in 1998 in [place] for a stage II invasive ductal carcinoma, grade 2, treated with adjuvant chemotherapy (specific drugs not clear) and adjuvant radiation.  (2) status  post left breast upper outer quadrant biopsy 10/04/2013 for a clinically mT2 N0, stage IIA invasive ductal carcinoma, grade 2, estrogen receptor 100% positive, progesterone receptor 33% positive, with an MIB-1 of 15%, and HER-2 amplified; staging studies showed no evidence of metastatic disease  (3) started on neoadjuvant carboplatin/ docetaxel/ trastuzumab/ pertuzumab 11/12/2013  (a) completed 4 cycles as of 05/06/201, after which docetaxel was discontinued because of neuropathy symptoms   (b) gemcitabine was given day 1 and day 8 with carboplatin day 1 in the final 2 cycles, completed 03/04/2014   (c)  completed a year of trastuzumab March 2016--  Final echo 10/21/2014 shows a normal EF  (4) left mastectomy  04/12/2014 showed residual mypT1c ypNX invasive ductal carcinoma, grade 2, estrogen and progesterone receptor positive, HER-2 negative, with negative margins  (5) anastrozole started 07/14/2014, completing five years November 2020  (a) bone density 07/29/2014 shows T score - 1.2 (mild osteopenia)  (b) bone density 11/24/2017 showed a T score of  -1.6 osteopenia  (6) 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  (7) mammogram and ultrasound recently showed a hypoechoic mass at the 7 o'clock position 9 cm from the nipple measuring 8 x 7 x 8 mm and another slightly irregular hypoechoic mass at the 7 o'clock position 10 cm from the nipple measuring 4 x 4 x 5 mm.  Pathology showed low-grade IDC at the 7 o'clock position 9 cm from the nipple and DCIS at the 7 o'clock position 10 cm from the nipple.  She went through right mastectomy and is here to review the Oncotype results and further recommendations.  Oncotype DX result 7, distant risk recurrence at 9 years of 3%, no definitive chemotherapy benefit.  PLAN:  She is currently on exemestane, tolerating it very well.  She wants to continue it for 10 years.  She will continue to follow-up with Dr. Leta Baptist  for her recent reconstruction procedure.   No concerns for recurrence on exam or from review of systems.  Breast Cancer Post-Mastectomy Complications Persistent wound issue with leakage and infection. Third corrective surgery planned. -Continue with planned corrective surgery. -Replace current wound dressing with gauze to allow wound to breathe.  Breast Cancer Surveillance Bilateral mastectomy with implants. No current mammogram due until May. -Plan for mammogram with implants once surgical correction is complete.  Lymphedema Plan to address post-surgical correction. -Refer to specialists at [facility] for lymphedema surgery.  Gynecological Health Maintenance Last Pap smear 3 years ago. -Plan for Pap smear in the next 1-2 years as per current guidelines.  Follow-up in 6 months or post-surgical correction.  Total time spent: 30 min *Total Encounter Time as defined by the Centers for Medicare and Medicaid Services includes, in addition to the face-to-face time of a patient visit (documented in the note above) non-face-to-face time: obtaining and reviewing outside history, ordering and reviewing medications, tests or procedures, care coordination (communications with other health care professionals or caregivers) and documentation in the medical  record.

## 2023-08-04 NOTE — Progress Notes (Signed)

## 2023-08-09 ENCOUNTER — Ambulatory Visit (HOSPITAL_BASED_OUTPATIENT_CLINIC_OR_DEPARTMENT_OTHER)
Admission: RE | Admit: 2023-08-09 | Discharge: 2023-08-09 | Disposition: A | Discharge status: Home / Self Care | Payer: Federal, State, Local not specified - PPO | Source: Ambulatory Visit | Attending: Plastic Surgery | Admitting: Plastic Surgery

## 2023-08-09 ENCOUNTER — Ambulatory Visit (HOSPITAL_BASED_OUTPATIENT_CLINIC_OR_DEPARTMENT_OTHER): Payer: Federal, State, Local not specified - PPO | Admitting: Anesthesiology

## 2023-08-09 ENCOUNTER — Encounter (HOSPITAL_BASED_OUTPATIENT_CLINIC_OR_DEPARTMENT_OTHER)
Admission: RE | Disposition: A | Discharge status: Home / Self Care | Payer: Self-pay | Source: Ambulatory Visit | Attending: Plastic Surgery

## 2023-08-09 ENCOUNTER — Other Ambulatory Visit: Payer: Self-pay

## 2023-08-09 ENCOUNTER — Encounter (HOSPITAL_BASED_OUTPATIENT_CLINIC_OR_DEPARTMENT_OTHER): Payer: Self-pay | Admitting: Plastic Surgery

## 2023-08-09 DIAGNOSIS — I96 Gangrene, not elsewhere classified: Secondary | ICD-10-CM | POA: Diagnosis not present

## 2023-08-09 DIAGNOSIS — Z9013 Acquired absence of bilateral breasts and nipples: Secondary | ICD-10-CM | POA: Diagnosis not present

## 2023-08-09 DIAGNOSIS — X58XXXA Exposure to other specified factors, initial encounter: Secondary | ICD-10-CM | POA: Diagnosis not present

## 2023-08-09 DIAGNOSIS — T85898A Other specified complication of other internal prosthetic devices, implants and grafts, initial encounter: Secondary | ICD-10-CM | POA: Diagnosis not present

## 2023-08-09 DIAGNOSIS — F32A Depression, unspecified: Secondary | ICD-10-CM | POA: Insufficient documentation

## 2023-08-09 DIAGNOSIS — Z853 Personal history of malignant neoplasm of breast: Secondary | ICD-10-CM | POA: Diagnosis not present

## 2023-08-09 DIAGNOSIS — T8541XA Breakdown (mechanical) of breast prosthesis and implant, initial encounter: Secondary | ICD-10-CM | POA: Diagnosis not present

## 2023-08-09 DIAGNOSIS — Z923 Personal history of irradiation: Secondary | ICD-10-CM | POA: Diagnosis not present

## 2023-08-09 DIAGNOSIS — Z9221 Personal history of antineoplastic chemotherapy: Secondary | ICD-10-CM | POA: Diagnosis not present

## 2023-08-09 DIAGNOSIS — I10 Essential (primary) hypertension: Secondary | ICD-10-CM | POA: Insufficient documentation

## 2023-08-09 DIAGNOSIS — L308 Other specified dermatitis: Secondary | ICD-10-CM | POA: Diagnosis not present

## 2023-08-09 HISTORY — PX: BREAST IMPLANT REMOVAL: SHX5361

## 2023-08-09 SURGERY — REMOVAL, IMPLANT, BREAST
Anesthesia: General | Site: Chest | Laterality: Right

## 2023-08-09 MED ORDER — FENTANYL CITRATE (PF) 100 MCG/2ML IJ SOLN
INTRAMUSCULAR | Status: AC
  Filled 2023-08-09: qty 2

## 2023-08-09 MED ORDER — PROPOFOL 10 MG/ML IV BOLUS
INTRAVENOUS | Status: AC
  Filled 2023-08-09: qty 20

## 2023-08-09 MED ORDER — PROPOFOL 10 MG/ML IV BOLUS
INTRAVENOUS | Status: DC | PRN
  Administered 2023-08-09: 150 mg via INTRAVENOUS
  Administered 2023-08-09 (×2): 20 mg via INTRAVENOUS

## 2023-08-09 MED ORDER — VASHE WOUND IRRIGATION OPTIME
TOPICAL | Status: DC | PRN
  Administered 2023-08-09: 34 [oz_av]

## 2023-08-09 MED ORDER — GENTAMICIN SULFATE 40 MG/ML IJ SOLN
INTRAMUSCULAR | Status: AC
  Filled 2023-08-09: qty 10

## 2023-08-09 MED ORDER — BUPIVACAINE-EPINEPHRINE (PF) 0.25% -1:200000 IJ SOLN
INTRAMUSCULAR | Status: AC
  Filled 2023-08-09: qty 30

## 2023-08-09 MED ORDER — PROPOFOL 500 MG/50ML IV EMUL
INTRAVENOUS | Status: AC
Start: 2023-08-09 — End: ?
  Filled 2023-08-09: qty 50

## 2023-08-09 MED ORDER — PROPOFOL 500 MG/50ML IV EMUL
INTRAVENOUS | Status: AC
  Filled 2023-08-09: qty 100

## 2023-08-09 MED ORDER — DEXMEDETOMIDINE HCL IN NACL 80 MCG/20ML IV SOLN
INTRAVENOUS | Status: AC
  Filled 2023-08-09: qty 20

## 2023-08-09 MED ORDER — CEFAZOLIN SODIUM-DEXTROSE 2-4 GM/100ML-% IV SOLN
2.0000 g | INTRAVENOUS | Status: AC
  Administered 2023-08-09: 2 g via INTRAVENOUS

## 2023-08-09 MED ORDER — LACTATED RINGERS IV SOLN: INTRAVENOUS | Status: DC

## 2023-08-09 MED ORDER — ACETAMINOPHEN 500 MG PO TABS
1000.0000 mg | ORAL_TABLET | ORAL | Status: AC
  Administered 2023-08-09: 1000 mg via ORAL

## 2023-08-09 MED ORDER — GABAPENTIN 300 MG PO CAPS
300.0000 mg | ORAL_CAPSULE | ORAL | Status: AC
  Administered 2023-08-09: 300 mg via ORAL

## 2023-08-09 MED ORDER — HYDROMORPHONE HCL 1 MG/ML IJ SOLN
INTRAMUSCULAR | Status: AC
  Filled 2023-08-09: qty 0.5

## 2023-08-09 MED ORDER — CEFAZOLIN SODIUM-DEXTROSE 2-4 GM/100ML-% IV SOLN
INTRAVENOUS | Status: AC
  Filled 2023-08-09: qty 100

## 2023-08-09 MED ORDER — BUPIVACAINE HCL (PF) 0.25 % IJ SOLN
INTRAMUSCULAR | Status: DC | PRN
  Administered 2023-08-09: 20 mL

## 2023-08-09 MED ORDER — ONDANSETRON HCL 4 MG/2ML IJ SOLN
INTRAMUSCULAR | Status: DC | PRN
  Administered 2023-08-09: 4 mg via INTRAVENOUS

## 2023-08-09 MED ORDER — GABAPENTIN 300 MG PO CAPS
ORAL_CAPSULE | ORAL | Status: AC
Start: 2023-08-09 — End: ?
  Filled 2023-08-09: qty 1

## 2023-08-09 MED ORDER — CELECOXIB 200 MG PO CAPS
200.0000 mg | ORAL_CAPSULE | ORAL | Status: AC
  Administered 2023-08-09: 200 mg via ORAL

## 2023-08-09 MED ORDER — BUPIVACAINE HCL (PF) 0.25 % IJ SOLN
INTRAMUSCULAR | Status: AC
  Filled 2023-08-09: qty 30

## 2023-08-09 MED ORDER — DEXMEDETOMIDINE HCL IN NACL 80 MCG/20ML IV SOLN
INTRAVENOUS | Status: DC | PRN
  Administered 2023-08-09: 4 ug via INTRAVENOUS

## 2023-08-09 MED ORDER — FENTANYL CITRATE (PF) 100 MCG/2ML IJ SOLN
INTRAMUSCULAR | Status: DC | PRN
  Administered 2023-08-09: 50 ug via INTRAVENOUS
  Administered 2023-08-09: 25 ug via INTRAVENOUS

## 2023-08-09 MED ORDER — LIDOCAINE HCL (CARDIAC) PF 100 MG/5ML IV SOSY
PREFILLED_SYRINGE | INTRAVENOUS | Status: DC | PRN
  Administered 2023-08-09: 100 mg via INTRAVENOUS

## 2023-08-09 MED ORDER — PROPOFOL 500 MG/50ML IV EMUL
INTRAVENOUS | Status: DC | PRN
  Administered 2023-08-09: 200 ug/kg/min via INTRAVENOUS

## 2023-08-09 MED ORDER — HYDROMORPHONE HCL 1 MG/ML IJ SOLN
0.2500 mg | INTRAMUSCULAR | Status: DC | PRN
Start: 2023-08-09 — End: 2023-08-09
  Administered 2023-08-09: 0.25 mg via INTRAVENOUS

## 2023-08-09 MED ORDER — CELECOXIB 200 MG PO CAPS
ORAL_CAPSULE | ORAL | Status: AC
  Filled 2023-08-09: qty 1

## 2023-08-09 MED ORDER — ACETAMINOPHEN 500 MG PO TABS
ORAL_TABLET | ORAL | Status: AC
  Filled 2023-08-09: qty 2

## 2023-08-09 MED ORDER — MIDAZOLAM HCL 2 MG/2ML IJ SOLN
INTRAMUSCULAR | Status: AC
Start: 2023-08-09 — End: ?
  Filled 2023-08-09: qty 2

## 2023-08-09 MED ORDER — SODIUM CHLORIDE 0.9 % IV SOLN
INTRAVENOUS | Status: DC | PRN
  Administered 2023-08-09: 500 mL

## 2023-08-09 MED ORDER — MIDAZOLAM HCL 2 MG/2ML IJ SOLN
INTRAMUSCULAR | Status: DC | PRN
  Administered 2023-08-09: 2 mg via INTRAVENOUS

## 2023-08-09 MED ORDER — DEXAMETHASONE SODIUM PHOSPHATE 4 MG/ML IJ SOLN
INTRAMUSCULAR | Status: DC | PRN
  Administered 2023-08-09: 8 mg via INTRAVENOUS

## 2023-08-09 SURGICAL SUPPLY — 48 items
BAG DECANTER FOR FLEXI CONT (MISCELLANEOUS) ×1 IMPLANT
BINDER BREAST XLRG (GAUZE/BANDAGES/DRESSINGS) IMPLANT
BINDER BREAST XXLRG (GAUZE/BANDAGES/DRESSINGS) IMPLANT
BLADE SURG 10 STRL SS (BLADE) ×1 IMPLANT
BLADE SURG 15 STRL LF DISP TIS (BLADE) ×1 IMPLANT
CANISTER SUCT 1200ML W/VALVE (MISCELLANEOUS) ×1 IMPLANT
CLEANSER WND VASHE 34 (WOUND CARE) IMPLANT
COVER BACK TABLE 60X90IN (DRAPES) ×1 IMPLANT
COVER MAYO STAND STRL (DRAPES) ×1 IMPLANT
DRAIN CHANNEL 19F RND (DRAIN) IMPLANT
DRAPE TOP ARMCOVERS (MISCELLANEOUS) ×1 IMPLANT
DRAPE U-SHAPE 76X120 STRL (DRAPES) ×1 IMPLANT
DRAPE UTILITY XL STRL (DRAPES) ×1 IMPLANT
DRSG TEGADERM 4X10 (GAUZE/BANDAGES/DRESSINGS) IMPLANT
ELECT COATED BLADE 2.86 ST (ELECTRODE) ×1 IMPLANT
ELECT REM PT RETURN 9FT ADLT (ELECTROSURGICAL) ×1
ELECTRODE REM PT RTRN 9FT ADLT (ELECTROSURGICAL) ×1 IMPLANT
EVACUATOR SILICONE 100CC (DRAIN) IMPLANT
GAUZE PAD ABD 8X10 STRL (GAUZE/BANDAGES/DRESSINGS) ×2 IMPLANT
GLOVE BIO SURGEON STRL SZ 6 (GLOVE) ×2 IMPLANT
GOWN STRL REUS W/ TWL LRG LVL3 (GOWN DISPOSABLE) ×2 IMPLANT
NDL HYPO 25X1 1.5 SAFETY (NEEDLE) IMPLANT
NEEDLE HYPO 25X1 1.5 SAFETY (NEEDLE) ×1
PACK BASIN DAY SURGERY FS (CUSTOM PROCEDURE TRAY) ×1 IMPLANT
PENCIL SMOKE EVACUATOR (MISCELLANEOUS) ×1 IMPLANT
SHEET MEDIUM DRAPE 40X70 STRL (DRAPES) ×2 IMPLANT
SLEEVE SCD COMPRESS KNEE MED (STOCKING) ×1 IMPLANT
SPONGE T-LAP 18X18 ~~LOC~~+RFID (SPONGE) ×2 IMPLANT
STAPLER SKIN PROX WIDE 3.9 (STAPLE) ×1 IMPLANT
STRIP CLOSURE SKIN 1/2X4 (GAUZE/BANDAGES/DRESSINGS) IMPLANT
SUT CHROMIC 4 0 PS 2 18 (SUTURE) IMPLANT
SUT ETHILON 2 0 FS 18 (SUTURE) ×1 IMPLANT
SUT MNCRL AB 4-0 PS2 18 (SUTURE) ×1 IMPLANT
SUT MON AB 3-0 SH27 (SUTURE) IMPLANT
SUT PDS AB 2-0 CT2 27 (SUTURE) IMPLANT
SUT SILK 2 0 SH (SUTURE) IMPLANT
SUT VIC AB 3-0 PS1 18XBRD (SUTURE) IMPLANT
SUT VIC AB 3-0 SH 27X BRD (SUTURE) ×1 IMPLANT
SUT VIC AB 4-0 PS2 18 (SUTURE) ×1 IMPLANT
SUT VICRYL 0 CT-2 (SUTURE) IMPLANT
SUT VLOC 180 0 24IN GS25 (SUTURE) IMPLANT
SYR BULB IRRIG 60ML STRL (SYRINGE) ×2 IMPLANT
SYR CONTROL 10ML LL (SYRINGE) IMPLANT
TOWEL GREEN STERILE FF (TOWEL DISPOSABLE) ×2 IMPLANT
TRAY DSU PREP LF (CUSTOM PROCEDURE TRAY) IMPLANT
TUBE CONNECTING 20X1/4 (TUBING) ×2 IMPLANT
UNDERPAD 30X36 HEAVY ABSORB (UNDERPADS AND DIAPERS) ×2 IMPLANT
YANKAUER SUCT BULB TIP NO VENT (SUCTIONS) ×1 IMPLANT

## 2023-08-09 NOTE — Discharge Instructions (Signed)
  Post Anesthesia Home Care Instructions  Activity: Get plenty of rest for the remainder of the day. A responsible individual must stay with you for 24 hours following the procedure.  For the next 24 hours, DO NOT: -Drive a car -Advertising copywriter -Drink alcoholic beverages -Take any medication unless instructed by your physician -Make any legal decisions or sign important papers.  Meals: Start with liquid foods such as gelatin or soup. Progress to regular foods as tolerated. Avoid greasy, spicy, heavy foods. If nausea and/or vomiting occur, drink only clear liquids until the nausea and/or vomiting subsides. Call your physician if vomiting continues.  Special Instructions/Symptoms: Your throat may feel dry or sore from the anesthesia or the breathing tube placed in your throat during surgery. If this causes discomfort, gargle with warm salt water. The discomfort should disappear within 24 hours.  May have Tylenol after 3:15pm if needed.   What is a Jackson-Pratt bulb? A Jackson-Pratt is a soft, round device used to collect drainage. It is connected to a long, thin drainage catheter, which is held in place by one or two small stiches near your surgical incision site. When the bulb is squeezed, it forms a vacuum, forcing the drainage to empty into the bulb.  Emptying the Jackson-Pratt bulb- To empty the bulb: 1. Release the plug on the top of the bulb. 2. Pour the bulb's contents into a measuring container which your nurse will provide. 3. Record the time emptied and amount of drainage. Empty the drain(s) as often as your     doctor or nurse recommends.  Date                  Time                    Amount (Drain 1)                 Amount (Drain  2)  _____________________________________________________________________  _____________________________________________________________________  _____________________________________________________________________  _____________________________________________________________________  _____________________________________________________________________  _____________________________________________________________________  _____________________________________________________________________  _____________________________________________________________________  Squeezing the Jackson-Pratt Bulb- To squeeze the bulb: 1. Make sure the plug at the top of the bulb is open. 2. Squeeze the bulb tightly in your fist. You will hear air squeezing from the bulb. 3. Replace the plug while the bulb is squeezed. 4. Use a safety pin to attach the bulb to your clothing. This will keep the catheter from     pulling at the bulb insertion site.  When to call your doctor- Call your doctor if: Drain site becomes red, swollen or hot. You have a fever greater than 101 degrees F. There is oozing at the drain site. Drain falls out (apply a guaze bandage over the drain hole and secure it with tape). Drainage increases daily not related to activity patterns. (You will usually have more drainage when you are active than when you are resting.) Drainage has a bad odor.

## 2023-08-09 NOTE — Anesthesia Preprocedure Evaluation (Addendum)
Anesthesia Evaluation  Patient identified by MRN, date of birth, ID band Patient awake    Reviewed: Allergy & Precautions, H&P , NPO status , Patient's Chart, lab work & pertinent test results  Airway Mallampati: III  TM Distance: >3 FB Neck ROM: Full    Dental no notable dental hx. (+) Teeth Intact, Dental Advisory Given   Pulmonary neg pulmonary ROS   Pulmonary exam normal breath sounds clear to auscultation       Cardiovascular hypertension, Pt. on medications  Rhythm:Regular Rate:Normal     Neuro/Psych    Depression    CVA    GI/Hepatic negative GI ROS, Neg liver ROS,,,  Endo/Other  negative endocrine ROS    Renal/GU negative Renal ROS  negative genitourinary   Musculoskeletal   Abdominal   Peds  Hematology negative hematology ROS (+)   Anesthesia Other Findings   Reproductive/Obstetrics negative OB ROS                             Anesthesia Physical Anesthesia Plan  ASA: 2  Anesthesia Plan: General   Post-op Pain Management: Tylenol PO (pre-op)* and Toradol IV (intra-op)*   Induction: Intravenous  PONV Risk Score and Plan: 4 or greater and Ondansetron, Dexamethasone and Midazolam  Airway Management Planned: Oral ETT  Additional Equipment:   Intra-op Plan:   Post-operative Plan: Extubation in OR  Informed Consent: I have reviewed the patients History and Physical, chart, labs and discussed the procedure including the risks, benefits and alternatives for the proposed anesthesia with the patient or authorized representative who has indicated his/her understanding and acceptance.     Dental advisory given  Plan Discussed with: CRNA  Anesthesia Plan Comments:        Anesthesia Quick Evaluation

## 2023-08-09 NOTE — H&P (Addendum)
Subjective  Patient ID: Sheran Dicola is a 62 y.o. female.  HPI  4.5 m post op right mastectomy with immediate expander based reconstruction. Course complicated by eschar vertical incision, underwent operative debridement and closure. Presents today for revision right reconstruction. She notes few day history exposure tissue expander.  Noted onset right breast retroareolar pain 2024. Diagnostic MMG/US 01/2023 showed a 0.8 mass at 7 o'clock, 9 cmfn and a 0.5 cm mass 7 o'clock, 10 cmfn. No sonographic abnormalities within the retroareolar right breast noted. Right axilla normal. Biopsies labeled right, 7 o'clock, 9 cmfn showed IDC with DCIS, ER/PR+, Her 2-, and right breast 7 o'clock, 10 cmfn intermediate grade DCIS. Patient elected for mastectomy. Final pathology right 1.2 cm IDC margins clear 1/3 LN + with micrometastasis. Oncotype 7/3%.  The patient was originally diagnosed at age 5 with IDC in the left UOQ. The patient underwent lumpectomy and full axillary lymph node dissection with ALND, XRT, chemotherapy in [place]. 2/26 LN+. Screening MMG in December 2014 demonstrated breast asymmetry and follow up biospy demonstrated mass measuring 1 x 0.4 x 1.7 cm 2 cm from the nipple. , IDC, ER/PR +, Her 2 +. She completed neoadjuvant chemotherapy. Completed arimidex course.  She underwent left mastectomy with immediate TE/LD flap reconstruction. Final pathology with two foci IDC 1.3 and 1.0 cm, margins negative. +LVI. Course complicated by seroma and return to OR for drainage and implant exchange. She underwent subsequent Z plasty for treatment left axillary scar (prior lumpectomy ALND scar).  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   She has lymphedema of LUE and uses sleeve and compression pump.   Right mastectomy 661 g  Lives with wife who continues to work for [employer], states doing training now. 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  Review of  Systems  Objective  Physical Exam  Cardiovascular: Normal rate, regular rhythm and normal heart sounds.   Pulmonary/Chest Effort normal and breath sounds normal.   Chest: Left chest absent with implant reconstruction, Baker 1 Right chest  appr 1.5 cm area exposed expander no drainage no cellulitis  Assessment/Plan  History left breast cancer s/p lumpectomy ALND Adjuvant chemotherapy, left chest radiation Recurrent left breast cancer S/p left mastectomy with latissimus/TE reconstruction S/p silicone implant exchange, lipofilling left chest, and right mastopexy Scar contracture left axilla -s/p Z plasty Right breast ca LOQ ER+ S/p right mastectomy SLN, prepectoral tissue expander reconstruction S/p debridement right mastectomy flap, closure wound  She now has exposed expander. This is now contaminated space. I recommend removal alone expander and allow soft tissue to heal prior to replacement. Patient and wife agree.   Additional risks including but not limited to bleeding seroma hematoma damage to adjacent structures infection asymmetry unacceptable cosmetic result need for additional procedures blood clots in legs or lungs reviewed.   Reviewed OP surgery, drain placement. Patient has pain medication and Robaxin at home. Rx for Bactrim given.  Glenna Fellows, MD MBA Plastic & Reconstructive Surgery  Office/ physician access line after hours [phone]    RIGHT Natrelle 133 S FV-13-T 500 ml tissue expander placed fill volume 320 ml saline + ml = ml total fill volume saline LEFT Mentor Smooth Ultra High Profile 535 ml silicone implant placed in left chest.

## 2023-08-09 NOTE — Anesthesia Procedure Notes (Signed)
Procedure Name: LMA Insertion Date/Time: 08/09/2023 7:39 AM  Performed by: Earmon Phoenix, CRNAPre-anesthesia Checklist: Patient identified, Emergency Drugs available, Suction available, Patient being monitored and Timeout performed Patient Re-evaluated:Patient Re-evaluated prior to induction Oxygen Delivery Method: Circle system utilized Preoxygenation: Pre-oxygenation with 100% oxygen Induction Type: IV induction Ventilation: Mask ventilation without difficulty LMA Size: 4.0 Number of attempts: 1 Placement Confirmation: positive ETCO2 and breath sounds checked- equal and bilateral Tube secured with: Tape Dental Injury: Teeth and Oropharynx as per pre-operative assessment

## 2023-08-09 NOTE — Transfer of Care (Signed)
Immediate Anesthesia Transfer of Care Note  Patient: Kelsey Mclaughlin  Procedure(s) Performed: REMOVAL RIGHT CHEST TISSUE EXPANDER (Right: Chest)  Patient Location: PACU  Anesthesia Type:General  Level of Consciousness: drowsy and patient cooperative  Airway & Oxygen Therapy: Patient Spontanous Breathing and Patient connected to face mask oxygen  Post-op Assessment: Report given to RN and Post -op Vital signs reviewed and stable  Post vital signs: Reviewed and stable  Last Vitals:  Vitals Value Taken Time  BP 115/61 08/09/23 0832  Temp    Pulse 59 08/09/23 0834  Resp 17 08/09/23 0834  SpO2 100 % 08/09/23 0834  Vitals shown include unfiled device data.  Last Pain:  Vitals:   08/09/23 0657  PainSc: 0-No pain         Complications: No notable events documented.

## 2023-08-09 NOTE — Anesthesia Postprocedure Evaluation (Signed)
Anesthesia Post Note  Patient: Kelsey Mclaughlin  Procedure(s) Performed: REMOVAL RIGHT CHEST TISSUE EXPANDER (Right: Chest)     Patient location during evaluation: PACU Anesthesia Type: General Level of consciousness: awake and alert Pain management: pain level controlled Vital Signs Assessment: post-procedure vital signs reviewed and stable Respiratory status: spontaneous breathing, nonlabored ventilation and respiratory function stable Cardiovascular status: blood pressure returned to baseline and stable Postop Assessment: no apparent nausea or vomiting Anesthetic complications: no  No notable events documented.  Last Vitals:  Vitals:   08/09/23 0900 08/09/23 0924  BP: 128/71 127/67  Pulse: (!) 55 60  Resp: 13 14  Temp:  (!) 36.2 C  SpO2: 97% 98%    Last Pain:  Vitals:   08/09/23 0924  PainSc: 2                  Mearl Harewood,W. EDMOND

## 2023-08-09 NOTE — Op Note (Signed)
Operative Note   DATE OF OPERATION: 11.26.2024  LOCATION: [facility]-outpatient  SURGICAL DIVISION: Plastic Surgery  PREOPERATIVE DIAGNOSES:  1. History bilateral breast cancer 2. Acquired absence breasts 3. History therapeutic radiation 4. Exposure right chest tissue expander  POSTOPERATIVE DIAGNOSES:  same  PROCEDURE:  Removal right chest tissue expander  SURGEON: Glenna Fellows MD MBA  ASSISTANT: none  ANESTHESIA:  General.   EBL: 10 ml  COMPLICATIONS: None immediate.   INDICATIONS FOR PROCEDURE:  The patient, Kelsey Mclaughlin, is a 62 y.o. female born on October 05, 1960, is here for removal right chest tissue expander following exposure.   FINDINGS: No fluid in cavity.  DESCRIPTION OF PROCEDURE:  The patient's operative site was marked with the patient in the preoperative area. The patient was taken to the operating room. SCDs were placed and IV antibiotics were given. The patient's operative site was prepped and draped in usual fashion. A time out was performed and all information was confirmed to be correct. Sharp excision skin margins wound completed. Expander removed. Curettage performed to cavity. Cavity irrigated with saline solution containing Ancef gentamicin and Betadine. Cavity irrigated with hypochlorous acid solution. 19 Fr JP drain placed and secured with 2-0 nylon. Closure wound completed with interrupted 3-0 monocryl in dermis and interrupted 4-0 monocryl skin closure. Steri strips applied. Dry dressing and breast binder applied.  The patient was allowed to wake from anesthesia, extubated and taken to the recovery room in satisfactory condition.   SPECIMENS: right mastectomy flap  DRAINS: 19 Fr JP in subcutaneous right chest.  Glenna Fellows, MD [facility] Plastic & Reconstructive Surgery  Office/ physician access line after hours [phone]

## 2023-08-10 ENCOUNTER — Encounter (HOSPITAL_BASED_OUTPATIENT_CLINIC_OR_DEPARTMENT_OTHER): Payer: Self-pay | Admitting: Plastic Surgery

## 2023-08-10 LAB — SURGICAL PATHOLOGY

## 2023-08-17 DIAGNOSIS — Z9013 Acquired absence of bilateral breasts and nipples: Secondary | ICD-10-CM | POA: Diagnosis not present

## 2023-08-17 DIAGNOSIS — Z853 Personal history of malignant neoplasm of breast: Secondary | ICD-10-CM | POA: Diagnosis not present

## 2023-08-17 DIAGNOSIS — Z923 Personal history of irradiation: Secondary | ICD-10-CM | POA: Diagnosis not present

## 2023-08-29 DIAGNOSIS — S91331A Puncture wound without foreign body, right foot, initial encounter: Secondary | ICD-10-CM | POA: Diagnosis not present

## 2023-08-29 DIAGNOSIS — M79671 Pain in right foot: Secondary | ICD-10-CM | POA: Diagnosis not present

## 2023-10-27 DIAGNOSIS — Z853 Personal history of malignant neoplasm of breast: Secondary | ICD-10-CM | POA: Diagnosis not present

## 2023-10-27 DIAGNOSIS — Z9013 Acquired absence of bilateral breasts and nipples: Secondary | ICD-10-CM | POA: Diagnosis not present

## 2023-10-27 DIAGNOSIS — Z923 Personal history of irradiation: Secondary | ICD-10-CM | POA: Diagnosis not present

## 2023-10-27 NOTE — H&P (Signed)
Subjective Patient ID: Kelsey Mclaughlin is a 63 y.o. female.     HPI   6.5 m post op with mastectomy with immediate reconstruction. Course complicated by difficulty wound healing and eventual exposure TE. Patient now 2.5 months post operative removal expander.     Noted onset right breast retroareolar pain 2024. Diagnostic MMG/US 01/2023 showed  a 0.8 mass at 7 o'clock, 9 cmfn and a 0.5 cm mass 7 o'clock, 10 cmfn. No sonographic abnormalities within the retroareolar right breast noted. Right axilla normal. Biopsies labeled  right, 7 o'clock, 9 cmfn showed IDC with DCIS, ER/PR+, Her 2-, and right breast 7 o'clock, 10 cmfn intermediate grade DCIS. Patient elected for mastectomy. Final pathology right 1.2 cm IDC margins clear 1/3 LN + with micrometastasis. Oncotype 7/3%.   The patient was originally diagnosed at age 53 with IDC in the left UOQ. The patient underwent lumpectomy and full axillary lymph node dissection with ALND, XRT, chemotherapy in [place]. 2/26 LN+.  Screening MMG in December 2014 demonstrated breast asymmetry and follow up biospy demonstrated mass measuring 1 x 0.4 x 1.7 cm 2 cm from the nipple. , IDC, ER/PR +, Her 2 +. She completed neoadjuvant chemotherapy. Completed arimidex course.   She underwent left mastectomy with immediate TE/LD flap reconstruction. Final pathology with two foci IDC 1.3 and 1.0 cm, margins negative. +LVI. Course complicated by seroma and return to OR for drainage and implant exchange. She underwent subsequent Z plasty for treatment left axillary scar (prior lumpectomy ALND scar).   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    She has lymphedema of LUE and uses sleeve and compression pump.    Right mastectomy 661 g   Lives with wife who continues to work for [employer], states doing training now. 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   Review of Systems   Objective Physical Exam  Cardiovascular: Normal  rate, regular rhythm and normal heart sounds.    Pulmonary/Chest Effort normal and breath sounds normal.    Lymph: no adenopathy   Chest: Left chest absent with implant reconstruction, Baker 1 Right chest soft tissue contracted, scar maturing     Assessment/Plan History left breast cancer s/p lumpectomy ALND Adjuvant chemotherapy, left chest radiation Recurrent left breast cancer S/p left mastectomy with latissimus/TE reconstruction S/p silicone implant exchange, lipofilling left chest, and right mastopexy Scar contracture left axilla -s/p Z plasty Right breast ca LOQ ER+ S/p right mastectomy SLN, prepectoral tissue expander reconstruction S/p debridement right mastectomy flap, closure wound Exposure right TE s/p removal     Pictures today.   Plan right breast reconstruction with tissue expander acellular dermis.    Reviewed use IMF scar only. Patient developed significant capsule surrounding expander following prior reconstruction in subcutaneous position. We have discussed use of ADM may help with less thick capsule formation. Reviewed cadaveric source, off label [place] in breast reconstruction. Reviewed purporse of use for soft tissue support. Reviewed in any infection both ADM and expander may need to be removed. Patient and wife agree to try this.   Additional risks including but not limited to bleeding seroma hematoma damage to adjacent structures infection asymmetry unacceptable cosmetic result need for additional procedures blood clots in legs or lungs reviewed.    Reviewed OP surgery, drain placement. Rx for Norco and Bactrim given.   Glenna Fellows, MD [facility] Plastic & Reconstructive Surgery  Office/ physician access line after hours [phone]    LEFT Mentor  Smooth Ultra High Profile 535 ml silicone implant placed in left chest.

## 2023-10-28 ENCOUNTER — Other Ambulatory Visit: Payer: Self-pay

## 2023-10-28 ENCOUNTER — Encounter (HOSPITAL_BASED_OUTPATIENT_CLINIC_OR_DEPARTMENT_OTHER): Payer: Self-pay | Admitting: Plastic Surgery

## 2023-10-31 ENCOUNTER — Encounter (HOSPITAL_BASED_OUTPATIENT_CLINIC_OR_DEPARTMENT_OTHER)
Admission: RE | Admit: 2023-10-31 | Discharge: 2023-10-31 | Disposition: A | Discharge status: Home / Self Care | Payer: Federal, State, Local not specified - PPO | Source: Ambulatory Visit | Attending: Plastic Surgery | Admitting: Plastic Surgery

## 2023-10-31 DIAGNOSIS — Z01812 Encounter for preprocedural laboratory examination: Secondary | ICD-10-CM | POA: Insufficient documentation

## 2023-10-31 LAB — BASIC METABOLIC PANEL
Anion gap: 8 (ref 5–15)
BUN: 14 mg/dL (ref 8–23)
CO2: 25 mmol/L (ref 22–32)
Calcium: 9.1 mg/dL (ref 8.9–10.3)
Chloride: 108 mmol/L (ref 98–111)
Creatinine, Ser: 0.66 mg/dL (ref 0.44–1.00)
GFR, Estimated: >60 mL/min (ref >60)
Glucose, Bld: 89 mg/dL (ref 70–99)
Potassium: 4 mmol/L (ref 3.5–5.1)
Sodium: 141 mmol/L (ref 135–145)

## 2023-10-31 MED ORDER — CHLORHEXIDINE GLUCONATE CLOTH 2 % EX PADS: 6.0000 | MEDICATED_PAD | Freq: Once | CUTANEOUS | Status: DC

## 2023-10-31 NOTE — Progress Notes (Signed)

## 2023-11-03 NOTE — Anesthesia Preprocedure Evaluation (Signed)
Anesthesia Evaluation  Patient identified by MRN, date of birth, ID band Patient awake    Reviewed: Allergy & Precautions, Patient's Chart, lab work & pertinent test results  Airway Mallampati: II  TM Distance: >3 FB Neck ROM: Full    Dental no notable dental hx. (+) Teeth Intact, Dental Advisory Given   Pulmonary    Pulmonary exam normal breath sounds clear to auscultation       Cardiovascular hypertension, Pt. on medications Normal cardiovascular exam Rhythm:Regular Rate:Normal     Neuro/Psych  PSYCHIATRIC DISORDERS  Depression    CVA    GI/Hepatic negative GI ROS, Neg liver ROS,,,  Endo/Other    Renal/GU negative Renal ROS     Musculoskeletal negative musculoskeletal ROS (+)    Abdominal   Peds  Hematology   Anesthesia Other Findings   Reproductive/Obstetrics                             Anesthesia Physical Anesthesia Plan  ASA: 3  Anesthesia Plan: General   Post-op Pain Management: Tylenol PO (pre-op)* and Precedex   Induction: Intravenous  PONV Risk Score and Plan: Propofol infusion, Treatment may vary due to age or medical condition, Ondansetron and Midazolam  Airway Management Planned: LMA  Additional Equipment: None  Intra-op Plan:   Post-operative Plan: Extubation in OR  Informed Consent: I have reviewed the patients History and Physical, chart, labs and discussed the procedure including the risks, benefits and alternatives for the proposed anesthesia with the patient or authorized representative who has indicated his/her understanding and acceptance.     Dental advisory given  Plan Discussed with: CRNA and Anesthesiologist  Anesthesia Plan Comments: (LMA TIVA)       Anesthesia Quick Evaluation

## 2023-11-04 ENCOUNTER — Encounter (HOSPITAL_BASED_OUTPATIENT_CLINIC_OR_DEPARTMENT_OTHER)
Admission: RE | Disposition: A | Discharge status: Home / Self Care | Payer: Self-pay | Source: Ambulatory Visit | Attending: Plastic Surgery

## 2023-11-04 ENCOUNTER — Encounter (HOSPITAL_BASED_OUTPATIENT_CLINIC_OR_DEPARTMENT_OTHER): Payer: Self-pay | Admitting: Plastic Surgery

## 2023-11-04 ENCOUNTER — Ambulatory Visit (HOSPITAL_BASED_OUTPATIENT_CLINIC_OR_DEPARTMENT_OTHER)
Admission: RE | Admit: 2023-11-04 | Discharge: 2023-11-04 | Disposition: A | Discharge status: Home / Self Care | Payer: Federal, State, Local not specified - PPO | Source: Ambulatory Visit | Attending: Plastic Surgery | Admitting: Plastic Surgery

## 2023-11-04 ENCOUNTER — Ambulatory Visit (HOSPITAL_BASED_OUTPATIENT_CLINIC_OR_DEPARTMENT_OTHER): Payer: Self-pay | Admitting: Anesthesiology

## 2023-11-04 ENCOUNTER — Other Ambulatory Visit: Payer: Self-pay

## 2023-11-04 DIAGNOSIS — Z01818 Encounter for other preprocedural examination: Secondary | ICD-10-CM

## 2023-11-04 DIAGNOSIS — Z8673 Personal history of transient ischemic attack (TIA), and cerebral infarction without residual deficits: Secondary | ICD-10-CM | POA: Diagnosis not present

## 2023-11-04 DIAGNOSIS — I5032 Chronic diastolic (congestive) heart failure: Secondary | ICD-10-CM | POA: Diagnosis not present

## 2023-11-04 DIAGNOSIS — Z9221 Personal history of antineoplastic chemotherapy: Secondary | ICD-10-CM | POA: Diagnosis not present

## 2023-11-04 DIAGNOSIS — I10 Essential (primary) hypertension: Secondary | ICD-10-CM | POA: Diagnosis not present

## 2023-11-04 DIAGNOSIS — Z9013 Acquired absence of bilateral breasts and nipples: Secondary | ICD-10-CM | POA: Diagnosis not present

## 2023-11-04 DIAGNOSIS — I890 Lymphedema, not elsewhere classified: Secondary | ICD-10-CM | POA: Insufficient documentation

## 2023-11-04 DIAGNOSIS — Z79899 Other long term (current) drug therapy: Secondary | ICD-10-CM | POA: Diagnosis not present

## 2023-11-04 DIAGNOSIS — Z421 Encounter for breast reconstruction following mastectomy: Secondary | ICD-10-CM | POA: Diagnosis not present

## 2023-11-04 DIAGNOSIS — Z9889 Other specified postprocedural states: Secondary | ICD-10-CM | POA: Diagnosis not present

## 2023-11-04 DIAGNOSIS — Z923 Personal history of irradiation: Secondary | ICD-10-CM | POA: Diagnosis not present

## 2023-11-04 DIAGNOSIS — I110 Hypertensive heart disease with heart failure: Secondary | ICD-10-CM | POA: Diagnosis not present

## 2023-11-04 DIAGNOSIS — Z9011 Acquired absence of right breast and nipple: Secondary | ICD-10-CM | POA: Diagnosis not present

## 2023-11-04 DIAGNOSIS — Z853 Personal history of malignant neoplasm of breast: Secondary | ICD-10-CM | POA: Diagnosis not present

## 2023-11-04 HISTORY — PX: BREAST RECONSTRUCTION WITH PLACEMENT OF TISSUE EXPANDER AND ALLODERM: SHX6805

## 2023-11-04 SURGERY — BREAST RECONSTRUCTION WITH PLACEMENT OF TISSUE EXPANDER AND ALLODERM
Anesthesia: General | Site: Breast | Laterality: Right

## 2023-11-04 MED ORDER — KETOROLAC TROMETHAMINE 30 MG/ML IJ SOLN: 30.0000 mg | Freq: Once | INTRAMUSCULAR | Status: DC | PRN

## 2023-11-04 MED ORDER — GABAPENTIN 300 MG PO CAPS
ORAL_CAPSULE | ORAL | Status: AC
  Filled 2023-11-04: qty 1

## 2023-11-04 MED ORDER — LIDOCAINE 2% (20 MG/ML) 5 ML SYRINGE
INTRAMUSCULAR | Status: AC
  Filled 2023-11-04: qty 5

## 2023-11-04 MED ORDER — BUPIVACAINE HCL (PF) 0.5 % IJ SOLN
INTRAMUSCULAR | Status: DC | PRN
  Administered 2023-11-04: 30 mL

## 2023-11-04 MED ORDER — ACETAMINOPHEN 500 MG PO TABS
1000.0000 mg | ORAL_TABLET | ORAL | Status: AC
  Administered 2023-11-04: 1000 mg via ORAL

## 2023-11-04 MED ORDER — MIDAZOLAM HCL 2 MG/2ML IJ SOLN
INTRAMUSCULAR | Status: AC
  Filled 2023-11-04: qty 2

## 2023-11-04 MED ORDER — DEXMEDETOMIDINE HCL IN NACL 80 MCG/20ML IV SOLN
INTRAVENOUS | Status: DC | PRN
Start: 2023-11-04 — End: 2023-11-04
  Administered 2023-11-04: 12 ug via INTRAVENOUS

## 2023-11-04 MED ORDER — OXYCODONE HCL 5 MG PO TABS
ORAL_TABLET | ORAL | Status: AC
  Filled 2023-11-04: qty 1

## 2023-11-04 MED ORDER — ONDANSETRON HCL 4 MG/2ML IJ SOLN: 4.0000 mg | Freq: Once | INTRAMUSCULAR | Status: DC | PRN

## 2023-11-04 MED ORDER — HYDROMORPHONE HCL 1 MG/ML IJ SOLN: 0.2500 mg | INTRAMUSCULAR | Status: DC | PRN

## 2023-11-04 MED ORDER — 0.9 % SODIUM CHLORIDE (POUR BTL) OPTIME
TOPICAL | Status: DC | PRN
  Administered 2023-11-04: 500 mL

## 2023-11-04 MED ORDER — PROPOFOL 10 MG/ML IV BOLUS
INTRAVENOUS | Status: DC | PRN
  Administered 2023-11-04: 150 mg via INTRAVENOUS

## 2023-11-04 MED ORDER — CELECOXIB 200 MG PO CAPS
200.0000 mg | ORAL_CAPSULE | ORAL | Status: AC
  Administered 2023-11-04: 200 mg via ORAL

## 2023-11-04 MED ORDER — PROPOFOL 500 MG/50ML IV EMUL
INTRAVENOUS | Status: DC | PRN
Start: 2023-11-04 — End: 2023-11-04
  Administered 2023-11-04: 250 ug/kg/min via INTRAVENOUS
  Administered 2023-11-04: 200 ug/kg/min via INTRAVENOUS

## 2023-11-04 MED ORDER — DEXAMETHASONE SODIUM PHOSPHATE 4 MG/ML IJ SOLN
INTRAMUSCULAR | Status: DC | PRN
  Administered 2023-11-04: 5 mg via INTRAVENOUS

## 2023-11-04 MED ORDER — CEFAZOLIN SODIUM-DEXTROSE 2-4 GM/100ML-% IV SOLN
INTRAVENOUS | Status: AC
  Filled 2023-11-04: qty 100

## 2023-11-04 MED ORDER — MIDAZOLAM HCL 5 MG/5ML IJ SOLN
INTRAMUSCULAR | Status: DC | PRN
  Administered 2023-11-04: 2 mg via INTRAVENOUS

## 2023-11-04 MED ORDER — OXYCODONE HCL 5 MG PO TABS
5.0000 mg | ORAL_TABLET | Freq: Once | ORAL | Status: AC | PRN
  Administered 2023-11-04: 5 mg via ORAL

## 2023-11-04 MED ORDER — ATROPINE SULFATE 0.4 MG/ML IV SOLN
INTRAVENOUS | Status: AC
  Filled 2023-11-04: qty 1

## 2023-11-04 MED ORDER — PHENYLEPHRINE 80 MCG/ML (10ML) SYRINGE FOR IV PUSH (FOR BLOOD PRESSURE SUPPORT)
PREFILLED_SYRINGE | INTRAVENOUS | Status: AC
  Filled 2023-11-04: qty 10

## 2023-11-04 MED ORDER — DEXAMETHASONE SODIUM PHOSPHATE 10 MG/ML IJ SOLN
INTRAMUSCULAR | Status: AC
  Filled 2023-11-04: qty 1

## 2023-11-04 MED ORDER — EPHEDRINE 5 MG/ML INJ
INTRAVENOUS | Status: AC
  Filled 2023-11-04: qty 5

## 2023-11-04 MED ORDER — ACETAMINOPHEN 500 MG PO TABS
ORAL_TABLET | ORAL | Status: AC
  Filled 2023-11-04: qty 2

## 2023-11-04 MED ORDER — SODIUM CHLORIDE 0.9 % IV SOLN
INTRAVENOUS | Status: DC | PRN
  Administered 2023-11-04: 500 mL

## 2023-11-04 MED ORDER — GABAPENTIN 300 MG PO CAPS
300.0000 mg | ORAL_CAPSULE | ORAL | Status: AC
  Administered 2023-11-04: 300 mg via ORAL

## 2023-11-04 MED ORDER — OXYCODONE HCL 5 MG/5ML PO SOLN: 5.0000 mg | Freq: Once | ORAL | Status: AC | PRN

## 2023-11-04 MED ORDER — LIDOCAINE HCL (CARDIAC) PF 100 MG/5ML IV SOSY
PREFILLED_SYRINGE | INTRAVENOUS | Status: DC | PRN
  Administered 2023-11-04: 20 mg via INTRAVENOUS

## 2023-11-04 MED ORDER — SUCCINYLCHOLINE CHLORIDE 200 MG/10ML IV SOSY
PREFILLED_SYRINGE | INTRAVENOUS | Status: AC
  Filled 2023-11-04: qty 10

## 2023-11-04 MED ORDER — FENTANYL CITRATE (PF) 100 MCG/2ML IJ SOLN
INTRAMUSCULAR | Status: DC | PRN
  Administered 2023-11-04 (×2): 25 ug via INTRAVENOUS
  Administered 2023-11-04: 50 ug via INTRAVENOUS

## 2023-11-04 MED ORDER — SODIUM CHLORIDE 0.9 % IV SOLN
INTRAVENOUS | Status: AC | PRN
  Administered 2023-11-04: 150 mL

## 2023-11-04 MED ORDER — POVIDONE-IODINE 10 % EX SOLN
CUTANEOUS | Status: DC | PRN
  Administered 2023-11-04: 1 via TOPICAL

## 2023-11-04 MED ORDER — CEFAZOLIN SODIUM-DEXTROSE 2-4 GM/100ML-% IV SOLN
2.0000 g | INTRAVENOUS | Status: AC
  Administered 2023-11-04: 2 g via INTRAVENOUS

## 2023-11-04 MED ORDER — SODIUM CHLORIDE 0.9 % IV SOLN
INTRAVENOUS | Status: AC
  Filled 2023-11-04: qty 10

## 2023-11-04 MED ORDER — CELECOXIB 200 MG PO CAPS
ORAL_CAPSULE | ORAL | Status: AC
  Filled 2023-11-04: qty 1

## 2023-11-04 MED ORDER — LACTATED RINGERS IV SOLN: INTRAVENOUS | Status: DC

## 2023-11-04 MED ORDER — DEXMEDETOMIDINE HCL IN NACL 80 MCG/20ML IV SOLN
INTRAVENOUS | Status: AC
  Filled 2023-11-04: qty 20

## 2023-11-04 MED ORDER — ONDANSETRON HCL 4 MG/2ML IJ SOLN
INTRAMUSCULAR | Status: AC
  Filled 2023-11-04: qty 2

## 2023-11-04 MED ORDER — FENTANYL CITRATE (PF) 100 MCG/2ML IJ SOLN
INTRAMUSCULAR | Status: AC
  Filled 2023-11-04: qty 2

## 2023-11-04 SURGICAL SUPPLY — 71 items
ALLOGRAFT PERF 16X20 1.6+/-0.4 (Tissue) IMPLANT
BAG DECANTER FOR FLEXI CONT (MISCELLANEOUS) ×1 IMPLANT
BINDER BREAST LRG (GAUZE/BANDAGES/DRESSINGS) IMPLANT
BINDER BREAST MEDIUM (GAUZE/BANDAGES/DRESSINGS) IMPLANT
BINDER BREAST XLRG (GAUZE/BANDAGES/DRESSINGS) IMPLANT
BINDER BREAST XXLRG (GAUZE/BANDAGES/DRESSINGS) IMPLANT
BIOPATCH RED 1 DISK 7.0 (GAUZE/BANDAGES/DRESSINGS) IMPLANT
BLADE SURG 10 STRL SS (BLADE) ×1 IMPLANT
BLADE SURG 15 STRL LF DISP TIS (BLADE) ×1 IMPLANT
BNDG GAUZE DERMACEA FLUFF 4 (GAUZE/BANDAGES/DRESSINGS) IMPLANT
CANISTER SUCT 1200ML W/VALVE (MISCELLANEOUS) ×1 IMPLANT
CHLORAPREP W/TINT 26 (MISCELLANEOUS) ×1 IMPLANT
COVER BACK TABLE 60X90IN (DRAPES) ×1 IMPLANT
COVER MAYO STAND STRL (DRAPES) ×1 IMPLANT
DERMABOND ADVANCED .7 DNX12 (GAUZE/BANDAGES/DRESSINGS) ×1 IMPLANT
DRAIN CHANNEL 15F RND FF W/TCR (WOUND CARE) IMPLANT
DRAIN CHANNEL 19F RND (DRAIN) IMPLANT
DRAPE INCISE IOBAN 66X45 STRL (DRAPES) IMPLANT
DRAPE TOP ARMCOVERS (MISCELLANEOUS) ×1 IMPLANT
DRAPE U-SHAPE 76X120 STRL (DRAPES) ×1 IMPLANT
DRAPE UTILITY XL STRL (DRAPES) ×1 IMPLANT
DRSG TEGADERM 2-3/8X2-3/4 SM (GAUZE/BANDAGES/DRESSINGS) IMPLANT
DRSG TEGADERM 4X10 (GAUZE/BANDAGES/DRESSINGS) IMPLANT
ELECT BLADE 4.0 EZ CLEAN MEGAD (MISCELLANEOUS) ×1
ELECT COATED BLADE 2.86 ST (ELECTRODE) ×1 IMPLANT
ELECT REM PT RETURN 9FT ADLT (ELECTROSURGICAL) ×1
ELECTRODE BLDE 4.0 EZ CLN MEGD (MISCELLANEOUS) ×1 IMPLANT
ELECTRODE REM PT RTRN 9FT ADLT (ELECTROSURGICAL) ×1 IMPLANT
EVACUATOR SILICONE 100CC (DRAIN) IMPLANT
EXPANDER TISSUE FV FOURTE 500 (Prosthesis & Implant Plastic) IMPLANT
GAUZE PAD ABD 8X10 STRL (GAUZE/BANDAGES/DRESSINGS) ×2 IMPLANT
GLOVE BIO SURGEON STRL SZ 6 (GLOVE) ×1 IMPLANT
GLOVE BIOGEL PI IND STRL 7.0 (GLOVE) IMPLANT
GLOVE INDICATOR 7.5 STRL GRN (GLOVE) IMPLANT
GLOVE SURG SS PI 7.0 STRL IVOR (GLOVE) IMPLANT
GOWN STRL REUS W/ TWL LRG LVL3 (GOWN DISPOSABLE) ×2 IMPLANT
KIT FILL ASEPTIC TRANSFER (MISCELLANEOUS) IMPLANT
MARKER SKIN DUAL TIP RULER LAB (MISCELLANEOUS) IMPLANT
NDL HYPO 25X1 1.5 SAFETY (NEEDLE) IMPLANT
NDL SAFETY ECLIPSE 18X1.5 (NEEDLE) ×1 IMPLANT
NEEDLE HYPO 25X1 1.5 SAFETY (NEEDLE) ×1
PENCIL SMOKE EVACUATOR (MISCELLANEOUS) ×1 IMPLANT
PIN SAFETY STERILE (MISCELLANEOUS) IMPLANT
PUNCH BIOPSY 4MM DISP (MISCELLANEOUS) IMPLANT
SHEET MEDIUM DRAPE 40X70 STRL (DRAPES) ×1 IMPLANT
SLEEVE SCD COMPRESS KNEE MED (STOCKING) ×1 IMPLANT
SPONGE T-LAP 18X18 ~~LOC~~+RFID (SPONGE) ×2 IMPLANT
STAPLER SKIN PROX WIDE 3.9 (STAPLE) ×1 IMPLANT
STRIP CLOSURE SKIN 1/2X4 (GAUZE/BANDAGES/DRESSINGS) IMPLANT
SUT CHROMIC 4 0 RB 1X27 (SUTURE) IMPLANT
SUT ETHILON 2 0 FS 18 (SUTURE) ×1 IMPLANT
SUT MNCRL AB 4-0 PS2 18 (SUTURE) ×1 IMPLANT
SUT PDS AB 2-0 CT2 27 (SUTURE) IMPLANT
SUT SILK 2 0 SH (SUTURE) IMPLANT
SUT STRATAFIX 0 PDS 27 VIOLET (SUTURE) ×1
SUT VIC AB 3-0 SH 27X BRD (SUTURE) IMPLANT
SUT VIC AB 4-0 PS2 18 (SUTURE) IMPLANT
SUT VICRYL 0 CT-2 (SUTURE) IMPLANT
SUT VICRYL RAPIDE 4/0 PS 2 (SUTURE) IMPLANT
SUTURE STRATFX 0 PDS 27 VIOLET (SUTURE) IMPLANT
SYR 10ML LL (SYRINGE) ×1 IMPLANT
SYR 50ML LL SCALE MARK (SYRINGE) ×1 IMPLANT
SYR BULB IRRIG 60ML STRL (SYRINGE) ×1 IMPLANT
SYR CONTROL 10ML LL (SYRINGE) IMPLANT
TAPE MEASURE VINYL STERILE (MISCELLANEOUS) IMPLANT
TISSUE EXPNDR FV FOURTE 500 (Prosthesis & Implant Plastic) ×1 IMPLANT
TOWEL GREEN STERILE FF (TOWEL DISPOSABLE) ×1 IMPLANT
TRAY DSU PREP LF (CUSTOM PROCEDURE TRAY) IMPLANT
TUBE CONNECTING 20X1/4 (TUBING) IMPLANT
UNDERPAD 30X36 HEAVY ABSORB (UNDERPADS AND DIAPERS) ×2 IMPLANT
YANKAUER SUCT BULB TIP NO VENT (SUCTIONS) ×1 IMPLANT

## 2023-11-04 NOTE — Transfer of Care (Signed)
Immediate Anesthesia Transfer of Care Note  Patient: Kelsey Mclaughlin  Procedure(s) Performed: BREAST RECONSTRUCTION WITH PLACEMENT OF TISSUE EXPANDER AND ALLODERM (Right: Chest)  Patient Location: PACU  Anesthesia Type:General  Level of Consciousness: awake, alert , oriented, drowsy, and patient cooperative  Airway & Oxygen Therapy: Patient Spontanous Breathing and Patient connected to face mask oxygen  Post-op Assessment: Report given to RN and Post -op Vital signs reviewed and stable  Post vital signs: Reviewed and stable  Last Vitals:  Vitals Value Taken Time  BP    Temp    Pulse 54 11/04/23 1110  Resp 13 11/04/23 1110  SpO2 98 % 11/04/23 1110  Vitals shown include unfiled device data.  Last Pain:  Vitals:   11/04/23 0753  TempSrc: Temporal  PainSc: 0-No pain      Patients Stated Pain Goal: 3 (11/04/23 0753)  Complications: No notable events documented.

## 2023-11-04 NOTE — Interval H&P Note (Signed)
History and Physical Interval Note:  11/04/2023 8:28 AM  Kelsey Mclaughlin  has presented today for surgery, with the diagnosis of history breast cancer acquired absence breasts history therapeutic radiation.  The various methods of treatment have been discussed with the patient and family. After consideration of risks, benefits and other options for treatment, the patient has consented to  Procedure(s): BREAST RECONSTRUCTION WITH PLACEMENT OF TISSUE EXPANDER AND ALLODERM (Right) as a surgical intervention.  The patient's history has been reviewed, patient examined, no change in status, stable for surgery.  I have reviewed the patient's chart and labs.  Questions were answered to the patient's satisfaction.     Irean Hong Anhthu Perdew

## 2023-11-04 NOTE — Anesthesia Postprocedure Evaluation (Signed)
Anesthesia Post Note  Patient: Kelsey Mclaughlin  Procedure(s) Performed: BREAST RECONSTRUCTION WITH PLACEMENT OF TISSUE EXPANDER AND ALLODERM (Right: Chest)     Patient location during evaluation: PACU Anesthesia Type: General Level of consciousness: awake and alert Pain management: pain level controlled Vital Signs Assessment: post-procedure vital signs reviewed and stable Respiratory status: spontaneous breathing, nonlabored ventilation, respiratory function stable and patient connected to nasal cannula oxygen Cardiovascular status: blood pressure returned to baseline and stable Postop Assessment: no apparent nausea or vomiting Anesthetic complications: no  No notable events documented.  Last Vitals:  Vitals:   11/04/23 1200 11/04/23 1213  BP: 115/62 129/62  Pulse: (!) 52 63  Resp: 14 17  Temp:  36.4 C  SpO2: 94% 96%    Last Pain:  Vitals:   11/04/23 1211  TempSrc:   PainSc: 4                  Trevor Iha

## 2023-11-04 NOTE — Op Note (Signed)
Operative Note   DATE OF OPERATION: 2.21.2025  LOCATION: [facility]-outpatient  SURGICAL DIVISION: Plastic Surgery  PREOPERATIVE DIAGNOSES:  1. History breast cancer 2. Acquired absence breasts 3. History therapeutic radiation  POSTOPERATIVE DIAGNOSES:  same  PROCEDURE:  1. Right breast reconstruction with tissue expander 2. Acellular dermis (Alloderm) to right chest 300 cm2  SURGEON: Glenna Fellows MD MBA  ASSISTANT: none  ANESTHESIA:  General.   EBL: 20 ml  COMPLICATIONS: None immediate.   INDICATIONS FOR PROCEDURE:  The patient, Kelsey Mclaughlin, is a 63 y.o. female born on [birth year], is here for delayed breast reconstruction with tissue expander following skin reduction pattern mastectomy.   FINDINGS: Natrelle 133S FV-13-500 ml tissue expander placed initial fill volume 150 ml saline. SN [id number]  DESCRIPTION OF PROCEDURE:  The patient in the preoperative area. The patient's operative site was prepped and draped in a sterile fashion. A time out was performed and all information was confirmed to be correct. Incision made in right inframammary fold scar and carried through superficial fascia to pectoralis muscle surface. Mastectomy flap elevated off pectoralis muscle to dimensions of anticipated expander placement. Cavity irrigated with saline solution containing Ancef, gentamicin, and Betadine. Local anesthetic infiltrated. A 19 Fr drain was placed in subcutaneous position laterally and a 15 Fr drain placed along inframammary fold. Each secured to skin with 2-0 nylon. The tissue expander was prepared on back table prior in insertion. The expander was filled with air. Perforated acellular dermis was draped over anterior surface expander. The ADM was then secured to itself over posterior surface of expander with 4-0 chromic. Redundant folds acellular dermis excised so that the ADM lay flat without folds over air filled expander. The expander was secured to fascia over lateral  sternal border with a 0 vicryl. The lateral tab was also secured to pectoralis muscle with 0 vicryl. The ADM was secured to pectoralis muscle and chest wall along inferior border at inframammary fold with 0 Strattafix suture. Skin closure completed with 3-0 vicryl in fascial layer and 4-0 vicryl in dermis. Skin closure completed with 4-0 monocryl subcuticular.   Expander port accessed and air removed. Saline placed to fill volume 150 ml. Steri strips applied over incision. Biopatch and Tegaderm dressings applied to drain sites followed by dry dressing, breast binder.  The patient was allowed to wake from anesthesia, extubated and taken to the recovery room in satisfactory condition.   SPECIMENS: none  DRAINS: 15 and 19 Fr JP in right subcutaneous chest  Glenna Fellows, MD [facility] Plastic & Reconstructive Surgery  Office/ physician access line after hours [phone]

## 2023-11-04 NOTE — Discharge Instructions (Signed)
 May take Tylenol after 2pm, if needed.  May take NSAIDS (ibuprofen/motrin) after 2 pm, if needed.     Post Anesthesia Home Care Instructions  Activity: Get plenty of rest for the remainder of the day. A responsible individual must stay with you for 24 hours following the procedure.  For the next 24 hours, DO NOT: -Drive a car -Advertising copywriter -Drink alcoholic beverages -Take any medication unless instructed by your physician -Make any legal decisions or sign important papers.  Meals: Start with liquid foods such as gelatin or soup. Progress to regular foods as tolerated. Avoid greasy, spicy, heavy foods. If nausea and/or vomiting occur, drink only clear liquids until the nausea and/or vomiting subsides. Call your physician if vomiting continues.  Special Instructions/Symptoms: Your throat may feel dry or sore from the anesthesia or the breathing tube placed in your throat during surgery. If this causes discomfort, gargle with warm salt water. The discomfort should disappear within 24 hours.  If you had a scopolamine patch placed behind your ear for the management of post- operative nausea and/or vomiting:  1. The medication in the patch is effective for 72 hours, after which it should be removed.  Wrap patch in a tissue and discard in the trash. Wash hands thoroughly with soap and water. 2. You may remove the patch earlier than 72 hours if you experience unpleasant side effects which may include dry mouth, dizziness or visual disturbances. 3. Avoid touching the patch. Wash your hands with soap and water after contact with the patch.

## 2023-11-04 NOTE — Anesthesia Procedure Notes (Signed)
Procedure Name: LMA Insertion Date/Time: 11/04/2023 9:18 AM  Performed by: Ronnette Hila, CRNAPre-anesthesia Checklist: Patient identified, Emergency Drugs available, Suction available and Patient being monitored Patient Re-evaluated:Patient Re-evaluated prior to induction Oxygen Delivery Method: Circle System Utilized Preoxygenation: Pre-oxygenation with 100% oxygen Induction Type: IV induction Ventilation: Mask ventilation without difficulty LMA: LMA inserted LMA Size: 4.0 Number of attempts: 1 Airway Equipment and Method: bite block Placement Confirmation: positive ETCO2 Tube secured with: Tape Dental Injury: Teeth and Oropharynx as per pre-operative assessment

## 2023-11-07 ENCOUNTER — Encounter (HOSPITAL_BASED_OUTPATIENT_CLINIC_OR_DEPARTMENT_OTHER): Payer: Self-pay | Admitting: Plastic Surgery

## 2023-11-10 DIAGNOSIS — Z923 Personal history of irradiation: Secondary | ICD-10-CM | POA: Diagnosis not present

## 2023-11-10 DIAGNOSIS — Z9013 Acquired absence of bilateral breasts and nipples: Secondary | ICD-10-CM | POA: Diagnosis not present

## 2023-11-10 DIAGNOSIS — Z853 Personal history of malignant neoplasm of breast: Secondary | ICD-10-CM | POA: Diagnosis not present

## 2023-11-17 DIAGNOSIS — Z853 Personal history of malignant neoplasm of breast: Secondary | ICD-10-CM | POA: Diagnosis not present

## 2023-11-17 DIAGNOSIS — Z923 Personal history of irradiation: Secondary | ICD-10-CM | POA: Diagnosis not present

## 2023-11-17 DIAGNOSIS — Z9013 Acquired absence of bilateral breasts and nipples: Secondary | ICD-10-CM | POA: Diagnosis not present

## 2023-11-24 DIAGNOSIS — Z9013 Acquired absence of bilateral breasts and nipples: Secondary | ICD-10-CM | POA: Diagnosis not present

## 2023-11-24 DIAGNOSIS — Z853 Personal history of malignant neoplasm of breast: Secondary | ICD-10-CM | POA: Diagnosis not present

## 2023-11-24 DIAGNOSIS — Z923 Personal history of irradiation: Secondary | ICD-10-CM | POA: Diagnosis not present

## 2023-12-01 DIAGNOSIS — Z9013 Acquired absence of bilateral breasts and nipples: Secondary | ICD-10-CM | POA: Diagnosis not present

## 2023-12-01 DIAGNOSIS — Z853 Personal history of malignant neoplasm of breast: Secondary | ICD-10-CM | POA: Diagnosis not present

## 2023-12-01 DIAGNOSIS — Z923 Personal history of irradiation: Secondary | ICD-10-CM | POA: Diagnosis not present

## 2024-01-04 NOTE — Progress Notes (Unsigned)
 [facility]  [phone] [phone]     ID: Kelsey Mclaughlin OB: 1961/06/14  MR#: [id number]  JYN#:[id number]  Patient Care Team: Kelsey Mclaughlin, Vyvyan, MD as PCP - General (Family Medicine) Kelsey Garnet, MD as Referring Physician (Family Medicine) Kelsey Infield, MD as Consulting Physician (Plastic Surgery) Kelsey Jewels, MD as Referring Physician (Obstetrics and Gynecology) Kelsey Arms, MD as Consulting Physician (Hematology and Oncology) OTHER MD: Kelsey Drain MD, Kelsey Eagle MD  CHIEF COMPLAINT: Breast cancer, recurrent, estrogen receptor positive  CURRENT TREATMENT: observation  INTERVAL HISTORY:  Discussed the use of AI scribe software for clinical note transcription with the patient, who gave verbal consent to proceed.  History of Present Illness         The patient, with a history of bilateral mastectomy and Kelsey Mclaughlin, presents for a follow-up on exemestane .   Discussed the use of AI scribe software for clinical note transcription with the patient, who gave verbal consent to proceed.  History of Present Illness Kelsey Mclaughlin is a 63 year old female with breast cancer who presents with side effects from exemestane .  She experiences headaches, which she believes are related to her hormone therapy with exemestane . The headaches have improved slightly but still occur frequently, often upon waking. She also reports joint pain, particularly in the larger joints, and nocturia, needing to urinate twice per night.  She has a history of depression and is currently taking antidepressants. She experienced an episode of uncontrollable crying two weeks ago, which she attributes to her medication. She has stopped taking bupropion and is considering alternative medications.  She has gained approximately twelve pounds since starting exemestane , which she finds difficult to lose. She attributes some of the weight gain to dietary  choices, such as consuming ice cream.  She has a family history of Hashimoto's thyroiditis and has noticed a possible thyroid  enlargement. She is concerned about her thyroid  function and is considering blood work to evaluate it.  She underwent axillary nodal dissection in 1990 and is interested in exploring surgical options for Kelsey Mclaughlin management.  She is planning to travel to [place] for two months and is concerned about managing her medical needs during this time.    Rest of the pertinent 10 point ROS reviewed and negative   COVID 19 VACCINATION STATUS: fully vaccinated +1 booster   BREAST CANCER HISTORY: From doctor Kelsey Mclaughlin's intake note 01/30/2014:  "Kelsey Mclaughlin is a 63 y.o. female. In 1998 at the age of 46 was diagnosed with left breast cancer in [place]. This was an invasive carcinoma grade 2 stage II. At that time she underwent a lumpectomy with excellent lymph node dissection. She received 6 months of chemotherapy. [name] She also had radiation therapy to the left breast. 2011 patient noticed a lump in the outside of her left breast. She had ultrasound workup performed and was told it was negative no biopsies were performed. General 2014 after patient moved to the [place]  she had a mammogram performed this revealed a possible mass in the outer quadrant of the left breast. Ultrasound showed it to be 1.7 cm. MRI of the breasts performed on January 30 revealed the mass to be 1.5 x 2.2 x 1.5 cm. Because of this she also had a biopsy performed on 10/04/2013. The biopsy revealed [[id number]) an invasive ductal carcinoma with ductal carcinoma in situ grade 2. Prognostic panel was positive  for estrogen receptor 100% megestrol receptor +33% proliferation marker Ki-67 15% and the tumor was HER-2/neu positive" [with a signals ratio of 3.1 and number per cell 6.3]  The patient's subsequent history is as detailed  below   PAST MEDICAL HISTORY: Past Medical History:  Diagnosis Date   Bronchitis    completed antibiotics 10/21/13/ states resolved   Cancer (HCC) 1998, 2015   Left Breast   Chronic diastolic (congestive) heart failure (HCC)    Fatty liver    Hypertension    Lymph edema    lt arm   Stroke (HCC) 2004   TIA-  no problems since    PAST SURGICAL HISTORY: Past Surgical History:  Procedure Laterality Date   ABDOMINOPLASTY/PANNICULECTOMY     BREAST BIOPSY Right 02/16/2023   US  RT BREAST BX W LOC DEV EA ADD LESION IMG BX SPEC US  GUIDE 02/16/2023 [facility]   BREAST BIOPSY Right 02/16/2023   US  RT BREAST BX W LOC DEV 1ST LESION IMG BX SPEC US  GUIDE 02/16/2023 [facility]   BREAST IMPLANT EXCHANGE Left 09/03/2014   Procedure: LEFT REMOVAL TISSUE EXPANDERS WITH PLACEMENT OF SILICONE IMPLANT ;  Surgeon: Kelsey Infield, MD;  Location: [facility];  Service: Plastics;  Laterality: Left;   BREAST IMPLANT REMOVAL Right 08/09/2023   Procedure: REMOVAL RIGHT CHEST TISSUE EXPANDER;  Surgeon: Kelsey Infield, MD;  Location: [facility];  Service: Plastics;  Laterality: Right;   BREAST RECONSTRUCTION Left 03/07/2015   Procedure: LEFT NIPPLE AEROLA CREATION WITH LOCAL FLAP/FULL THICKNESS SKIN GRAFT FROM GROIN;  Surgeon: Kelsey Infield, MD;  Location: [facility];  Service: Plastics;  Laterality: Left;   BREAST RECONSTRUCTION WITH PLACEMENT OF TISSUE EXPANDER AND ALLODERM Right 04/05/2023   Procedure: BREAST RECONSTRUCTION WITH PLACEMENT OF TISSUE EXPANDER;  Surgeon: Kelsey Infield, MD;  Location: [facility];  Service: Plastics;  Laterality: Right;   BREAST RECONSTRUCTION WITH PLACEMENT OF TISSUE EXPANDER AND ALLODERM Right 11/04/2023   Procedure: BREAST RECONSTRUCTION WITH PLACEMENT OF TISSUE EXPANDER AND ALLODERM;  Surgeon: Kelsey Infield, MD;  Location: [facility];  Service: Plastics;  Laterality: Right;   BREAST  SURGERY  1998   lumpectomy on left and removed lymphnodes in [place]   BUNIONECTOMY Left 07/31/2020   Procedure: Left Lapidus and Modified Phyllis Breeze;  Surgeon: Amada Backer, MD;  Location: [facility];  Service: Orthopedics;  Laterality: Left;   DEBRIDEMENT AND CLOSURE WOUND Right 05/11/2023   Procedure: DEBRIDEMENT AND CLOSURE WOUND  RIGHT MASTECTOMY FLAP;  Surgeon: Kelsey Infield, MD;  Location: [facility];  Service: Plastics;  Laterality: Right;   HYDRADENITIS EXCISION Left 05/30/2018   Procedure: Adjacent tissue transfer left axilla;  Surgeon: Kelsey Infield, MD;  Location: [facility];  Service: Plastics;  Laterality: Left;   LATISSIMUS FLAP TO BREAST Left 04/12/2014   Procedure: LEFT LATISSIMUS DORSI FLAP FOR LEFT BREAST RECONSTRUCTION AND PLACEMENT OF TISSUE EXPANDER;  Surgeon: Kelsey Infield, MD;  Location: [facility];  Service: Plastics;  Laterality: Left;   LIPOSUCTION WITH LIPOFILLING Left 09/03/2014   Procedure:  LIPOFILLING TO LEFT CHEST  ;  Surgeon: Kelsey Infield, MD;  Location: [facility];  Service: Plastics;  Laterality: Left;   LIPOSUCTION WITH LIPOFILLING Left 03/07/2015   Procedure: LIPOFILLING TO LEFT BREAST;  Surgeon: Kelsey Infield, MD;  Location: [facility];  Service: Plastics;  Laterality: Left;   MASTECTOMY Left 2015   MASTECTOMY W/ SENTINEL NODE BIOPSY Right 04/05/2023  Procedure: RIGHT MASTECTOMY WITH SENTINEL LYMPH NODE BIOPSY;  Surgeon: Dareen Ebbing, MD;  Location: [facility];  Service: General;  Laterality: Right;  PEC BLOCK   MASTOPEXY Right 09/03/2014   Procedure: RIGHT BREAST MASTOPEXY ;  Surgeon: Kelsey Infield, MD;  Location: [facility];  Service: Plastics;  Laterality: Right;   PORT-A-CATH REMOVAL Right 12/24/2014   Procedure: REMOVAL PORT-A-CATH;  Surgeon: Dareen Ebbing, MD;  Location: [facility];  Service: General;   Laterality: Right;   PORTACATH PLACEMENT Right 10/25/2013   Procedure: INSERTION PORT-A-CATH;  Surgeon: Kari Otto. Eli Grizzle, MD;  Location: [facility];  Service: General;  Laterality: Right;   REDUCTION MAMMAPLASTY Right    SIMPLE MASTECTOMY WITH AXILLARY SENTINEL NODE BIOPSY Left 04/12/2014   Procedure: MASTECTOMY;  Surgeon: Kari Otto. Eli Grizzle, MD;  Location: [facility];  Service: General;  Laterality: Left;   TISSUE EXPANDER PLACEMENT Left 04/12/2014   Procedure: TISSUE EXPANDER;  Surgeon: Kelsey Infield, MD;  Location: [facility];  Service: Plastics;  Laterality: Left;   TISSUE EXPANDER PLACEMENT Left 05/22/2014   Procedure: PLACEMENT OF TISSUE EXPANDER/I&D WASHOUT SEROMA;  Surgeon: Kelsey Infield, MD;  Location: [facility];  Service: Plastics;  Laterality: Left;    FAMILY HISTORY Family History  Problem Relation Age of Onset   Breast cancer Mother 79   Heart disease Father    Heart attack Father    Breast cancer Maternal Grandmother 29   Brain cancer Maternal Uncle 74       benign brain tumor   Heart attack Paternal Aunt     GYNECOLOGIC HISTORY:   menarche age 36, first live birth age 82. The patient is GX P2. She went through the change of life approximately 2011.    SOCIAL HISTORY:  The patient worked previously as a Engineer, civil (consulting). She has 2 children both of whom live in [place] -- Donata Fryer, works for the [employer], and [employer], a Occupational psychologist. There are no grandchildren. The patient married Almira Armour (both women are originally from [place]) in [place]  2011. [place] 5 dogs, 40+ chickens and 4 horses. They're not church attenders    ADVANCED DIRECTIVES: in place   HEALTH MAINTENANCE: Social History   Tobacco Use   Smoking status: Never   Smokeless tobacco: Never  Substance Use Topics   Alcohol use: Yes    Comment: 2 drinks/day   Drug use: No     Colonoscopy:  PAP: May 2017    Bone density:  11/24/2017 showed a T score of  -1.6 osteopenia  Lipid  panel:  Allergies  Allergen Reactions   Adhesive [Tape] Rash    Band-aids   Wound Dressing Adhesive Rash    Dermabond    Current Outpatient Medications  Medication Sig Dispense Refill   tamoxifen  (NOLVADEX ) 20 MG tablet Take 1 tablet (20 mg total) by mouth daily. 90 tablet 3   cholecalciferol (VITAMIN D ) 25 MCG (1000 UT) tablet TAKE 1 TABLET BY MOUTH EVERY DAY 100 tablet 4   exemestane  (AROMASIN ) 25 MG tablet Take 1 tablet (25 mg total) by mouth daily after breakfast. 90 tablet 3   losartan -hydrochlorothiazide  (HYZAAR ) 100-12.5 MG tablet      sulfamethoxazole -trimethoprim  (BACTRIM  DS) 800-160 MG tablet Take 1 tablet by mouth 2 (two) times daily. 14 tablet 0   Vilazodone HCl (VIIBRYD) 10 MG TABS Take 10 mg by mouth daily.     No current facility-administered medications for this visit.    OBJECTIVE: White woman  who appears younger than stated age  Vitals:   01/05/24 0911  BP: (!) 156/76  Pulse: 60  Resp: 18  Temp: 97.8 F (36.6 C)  SpO2: 97%        Body mass index is 32.12 kg/m.    Filed Weights   01/05/24 0911  Weight: 199 lb (90.3 kg)      ECOG FS:1 - Symptomatic but completely ambulatory   Physical Exam Constitutional:      Appearance: Normal appearance.  Chest:     Comments: She is status post bilateral mastectomy, wound completely healed. No palpable masses or regional adenopathy. Musculoskeletal:        General: No swelling or tenderness. Normal range of motion.     Cervical back: Normal range of motion and neck supple. No rigidity.  Lymphadenopathy:     Cervical: No cervical adenopathy.  Skin:    General: Skin is warm and dry.  Neurological:     General: No focal deficit present.     Mental Status: She is alert.      LAB RESULTS:  CMP     Component Value Date/Time   NA 141 10/31/2023 1614   NA 140 02/16/2017 1323   K 4.0 10/31/2023 1614   K 3.7 02/16/2017 1323   CL 108 10/31/2023 1614   CO2 25 10/31/2023 1614   CO2 26 02/16/2017 1323    GLUCOSE 89 10/31/2023 1614   GLUCOSE 113 02/16/2017 1323   BUN 14 10/31/2023 1614   BUN 12.8 02/16/2017 1323   CREATININE 0.66 10/31/2023 1614   CREATININE 0.72 06/02/2022 1446   CREATININE 0.8 02/16/2017 1323   CALCIUM 9.1 10/31/2023 1614   CALCIUM 9.7 02/16/2017 1323   PROT 6.4 (L) 03/31/2023 1331   PROT 6.9 02/16/2017 1323   ALBUMIN 3.9 03/31/2023 1331   ALBUMIN 4.1 02/16/2017 1323   AST 24 03/31/2023 1331   AST 31 06/02/2022 1446   AST 34 02/16/2017 1323   ALT 36 03/31/2023 1331   ALT 51 (H) 06/02/2022 1446   ALT 58 (H) 02/16/2017 1323   ALKPHOS 64 03/31/2023 1331   ALKPHOS 107 02/16/2017 1323   BILITOT 0.5 03/31/2023 1331   BILITOT 0.3 06/02/2022 1446   BILITOT 0.28 02/16/2017 1323   GFRNONAA >60 10/31/2023 1614   GFRNONAA >60 06/02/2022 1446   GFRAA >60 05/01/2020 1353    I No results found for: "SPEP", "UPEP"  Lab Results  Component Value Date   WBC 8.1 06/02/2022   NEUTROABS 5.3 06/02/2022   HGB 13.4 06/02/2022   HCT 39.3 06/02/2022   MCV 89.9 06/02/2022   PLT 300 06/02/2022      Chemistry      Component Value Date/Time   NA 141 10/31/2023 1614   NA 140 02/16/2017 1323   K 4.0 10/31/2023 1614   K 3.7 02/16/2017 1323   CL 108 10/31/2023 1614   CO2 25 10/31/2023 1614   CO2 26 02/16/2017 1323   BUN 14 10/31/2023 1614   BUN 12.8 02/16/2017 1323   CREATININE 0.66 10/31/2023 1614   CREATININE 0.72 06/02/2022 1446   CREATININE 0.8 02/16/2017 1323      Component Value Date/Time   CALCIUM 9.1 10/31/2023 1614   CALCIUM 9.7 02/16/2017 1323   ALKPHOS 64 03/31/2023 1331   ALKPHOS 107 02/16/2017 1323   AST 24 03/31/2023 1331   AST 31 06/02/2022 1446   AST 34 02/16/2017 1323   ALT 36 03/31/2023 1331   ALT 51 (H) 06/02/2022 1446  ALT 58 (H) 02/16/2017 1323   BILITOT 0.5 03/31/2023 1331   BILITOT 0.3 06/02/2022 1446   BILITOT 0.28 02/16/2017 1323       No results found for: "LABCA2"  No components found for: "LABCA125"  No results for  input(s): "INR" in the last 168 hours.  Urinalysis    Component Value Date/Time   COLORURINE YELLOW 04/05/2016 2040   APPEARANCEUR CLEAR 04/05/2016 2040   LABSPEC 1.027 04/05/2016 2040   LABSPEC 1.005 05/22/2014 1130   PHURINE 7.0 04/05/2016 2040   GLUCOSEU NEGATIVE 04/05/2016 2040   GLUCOSEU Negative 05/22/2014 1130   HGBUR NEGATIVE 04/05/2016 2040   BILIRUBINUR NEGATIVE 04/05/2016 2040   BILIRUBINUR Negative 05/22/2014 1130   KETONESUR NEGATIVE 04/05/2016 2040   PROTEINUR 100 (A) 04/05/2016 2040   UROBILINOGEN 0.2 05/22/2014 1130   NITRITE NEGATIVE 04/05/2016 2040   LEUKOCYTESUR NEGATIVE 04/05/2016 2040   LEUKOCYTESUR Negative 05/22/2014 1130    STUDIES: No results found.   ASSESSMENT: 63 y.o. BRCA negative [place] woman, [place] speaker   (1) status post left lumpectomy and axillary lymph node dissection in 1998 in [place] for a stage II invasive ductal carcinoma, grade 2, treated with adjuvant chemotherapy (specific drugs not clear) and adjuvant radiation.  (2) status post left breast upper outer quadrant biopsy 10/04/2013 for a clinically mT2 N0, stage IIA invasive ductal carcinoma, grade 2, estrogen receptor 100% positive, progesterone receptor 33% positive, with an MIB-1 of 15%, and HER-2 amplified; staging studies showed no evidence of metastatic disease  (3) started on neoadjuvant carboplatin / docetaxel / trastuzumab / pertuzumab  11/12/2013  (a) completed 4 cycles as of 05/06/201, after which docetaxel  was discontinued because of neuropathy symptoms   (b) gemcitabine  was given day 1 and day 8 with carboplatin  day 1 in the final 2 cycles, completed 03/04/2014   (c)  completed a year of trastuzumab  March 2016--  Final echo 10/21/2014 shows a normal EF  (4) left mastectomy  04/12/2014 showed residual mypT1c ypNX invasive ductal carcinoma, grade 2, estrogen and progesterone receptor positive, HER-2 negative, with negative margins  (5) anastrozole  started 07/14/2014,  completing five years November 2020  (a) bone density 07/29/2014 shows T score - 1.2 (mild osteopenia)  (b) bone density 11/24/2017 showed a T score of  -1.6 osteopenia  (6) 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  (7) June 2024, mammogram and ultrasound recently showed a hypoechoic mass at the 7 o'clock position 9 cm from the nipple measuring 8 x 7 x 8 mm and another slightly irregular hypoechoic mass at the 7 o'clock position 10 cm from the nipple measuring 4 x 4 x 5 mm.  Pathology showed low-grade IDC at the 7 o'clock position 9 cm from the nipple and DCIS at the 7 o'clock position 10 cm from the nipple.  She went through right mastectomy and is here to review the Oncotype results and further recommendations.  Oncotype DX result 7, distant risk recurrence at 9 years of 3%, no definitive chemotherapy benefit.  PLAN:  Assessment and Plan Assessment & Plan Breast cancer Current exemestane  therapy causes headaches and joint pain. Tamoxifen  proposed as alternative due to different mechanism, potentially reducing side effects and improving bone density. Discussed risks of blood clots and endometrial cancer. - Prescribe tamoxifen  20 mg once daily with a three-month supply and three refills. - Discuss potential side effects of tamoxifen , including a small risk of blood clots (1 in 4000) and endometrial cancer (1 in 5000). - Explain that tamoxifen  may  improve bone density. - Advise to report any breakthrough bleeding immediately.  Headache Headaches potentially related to exemestane . Improvement noted but monitoring advised post-tamoxifen  switch. - Monitor headache symptoms after starting tamoxifen . - Advise to contact provider if headaches persist after medication change.  Joint pain Joint pain possibly related to exemestane . Monitoring advised post-tamoxifen  switch. - Monitor joint pain symptoms after starting tamoxifen .  Kelsey Mclaughlin Kelsey Mclaughlin  secondary to axillary nodal dissection. Interested in surgical options at [facility]. - Research and identify a specialist at [facility] for Kelsey Mclaughlin surgery consultation. - Plan to schedule consultation after return from [place] in August.  Depression Depression managed with antidepressants. Recent emotional distress noted. Stopped bupropion.  - Recommend discussing with psychiatrist the possibility of switching to Lexapro , which does not interact with tamoxifen .  Possible Hashimoto's thyroiditis Possible Hashimoto's due to family history and thyroid  enlargement. Diagnosis requires blood work. - Order thyroid  function tests to evaluate for Hashimoto's thyroiditis.   Goals of Care Plans to travel to [place] for two months. Interested in completing evaluations and treatments within the year. - Plan follow-up appointment in six months. - Schedule bone density test after return from [place]. - Coordinate Kelsey Mclaughlin surgery consultation at [facility] for August.    Follow-up in 6 months or post-surgical correction.  Total time spent: 40 min *Total Encounter Time as defined by the Centers for Medicare and Medicaid Services includes, in addition to the face-to-face time of a patient visit (documented in the note above) non-face-to-face time: obtaining and reviewing outside history, ordering and reviewing medications, tests or procedures, care coordination (communications with other health care professionals or caregivers) and documentation in the medical record.

## 2024-01-05 ENCOUNTER — Inpatient Hospital Stay: Attending: Hematology and Oncology | Admitting: Hematology and Oncology

## 2024-01-05 ENCOUNTER — Inpatient Hospital Stay

## 2024-01-05 VITALS — BP 156/76 | HR 60 | Temp 97.8°F | Resp 18 | Wt 199.0 lb

## 2024-01-05 DIAGNOSIS — Z79811 Long term (current) use of aromatase inhibitors: Secondary | ICD-10-CM | POA: Insufficient documentation

## 2024-01-05 DIAGNOSIS — Z13820 Encounter for screening for osteoporosis: Secondary | ICD-10-CM | POA: Diagnosis not present

## 2024-01-05 DIAGNOSIS — Z780 Asymptomatic menopausal state: Secondary | ICD-10-CM

## 2024-01-05 DIAGNOSIS — I890 Lymphedema, not elsewhere classified: Secondary | ICD-10-CM | POA: Diagnosis not present

## 2024-01-05 DIAGNOSIS — F32A Depression, unspecified: Secondary | ICD-10-CM | POA: Diagnosis not present

## 2024-01-05 DIAGNOSIS — C50412 Malignant neoplasm of upper-outer quadrant of left female breast: Secondary | ICD-10-CM | POA: Diagnosis not present

## 2024-01-05 DIAGNOSIS — Z9013 Acquired absence of bilateral breasts and nipples: Secondary | ICD-10-CM | POA: Insufficient documentation

## 2024-01-05 DIAGNOSIS — Z923 Personal history of irradiation: Secondary | ICD-10-CM | POA: Diagnosis not present

## 2024-01-05 DIAGNOSIS — Z170 Estrogen receptor positive status [ER+]: Secondary | ICD-10-CM | POA: Insufficient documentation

## 2024-01-05 DIAGNOSIS — Z9221 Personal history of antineoplastic chemotherapy: Secondary | ICD-10-CM | POA: Diagnosis not present

## 2024-01-05 DIAGNOSIS — M8580 Other specified disorders of bone density and structure, unspecified site: Secondary | ICD-10-CM | POA: Insufficient documentation

## 2024-01-05 DIAGNOSIS — Z853 Personal history of malignant neoplasm of breast: Secondary | ICD-10-CM | POA: Insufficient documentation

## 2024-01-05 DIAGNOSIS — R519 Headache, unspecified: Secondary | ICD-10-CM | POA: Diagnosis not present

## 2024-01-05 DIAGNOSIS — C50511 Malignant neoplasm of lower-outer quadrant of right female breast: Secondary | ICD-10-CM | POA: Diagnosis not present

## 2024-01-05 LAB — T4, FREE: Free T4: 0.85 ng/dL (ref 0.61–1.12)

## 2024-01-05 LAB — CBC WITH DIFFERENTIAL/PLATELET
Abs Immature Granulocytes: 0.01 10*3/uL (ref 0.00–0.07)
Basophils Absolute: 0 10*3/uL (ref 0.0–0.1)
Basophils Relative: 1 %
Eosinophils Absolute: 0.2 10*3/uL (ref 0.0–0.5)
Eosinophils Relative: 4 %
HCT: 40.1 % (ref 36.0–46.0)
Hemoglobin: 13.6 g/dL (ref 12.0–15.0)
Immature Granulocytes: 0 %
Lymphocytes Relative: 23 %
Lymphs Abs: 1.4 10*3/uL (ref 0.7–4.0)
MCH: 29.6 pg (ref 26.0–34.0)
MCHC: 33.9 g/dL (ref 30.0–36.0)
MCV: 87.4 fL (ref 80.0–100.0)
Monocytes Absolute: 0.6 10*3/uL (ref 0.1–1.0)
Monocytes Relative: 9 %
Neutro Abs: 3.9 10*3/uL (ref 1.7–7.7)
Neutrophils Relative %: 63 %
Platelets: 310 10*3/uL (ref 150–400)
RBC: 4.59 MIL/uL (ref 3.87–5.11)
RDW: 13 % (ref 11.5–15.5)
WBC: 6.2 10*3/uL (ref 4.0–10.5)
nRBC: 0 % (ref 0.0–0.2)

## 2024-01-05 LAB — CMP (CANCER CENTER ONLY)
ALT: 44 U/L (ref 0–44)
AST: 25 U/L (ref 15–41)
Albumin: 4.4 g/dL (ref 3.5–5.0)
Alkaline Phosphatase: 68 U/L (ref 38–126)
Anion gap: 5 (ref 5–15)
BUN: 15 mg/dL (ref 8–23)
CO2: 30 mmol/L (ref 22–32)
Calcium: 9.4 mg/dL (ref 8.9–10.3)
Chloride: 107 mmol/L (ref 98–111)
Creatinine: 0.76 mg/dL (ref 0.44–1.00)
GFR, Estimated: >60 mL/min (ref >60)
Glucose, Bld: 106 mg/dL — ABNORMAL HIGH (ref 70–99)
Potassium: 3.9 mmol/L (ref 3.5–5.1)
Sodium: 142 mmol/L (ref 135–145)
Total Bilirubin: 0.4 mg/dL (ref 0.0–1.2)
Total Protein: 7 g/dL (ref 6.5–8.1)

## 2024-01-05 LAB — TSH: TSH: 2.23 u[IU]/mL (ref 0.350–4.500)

## 2024-01-05 MED ORDER — TAMOXIFEN CITRATE 20 MG PO TABS: 20.0000 mg | ORAL_TABLET | Freq: Every day | ORAL | 3 refills | Status: AC

## 2024-01-11 ENCOUNTER — Telehealth: Payer: Self-pay

## 2024-01-11 DIAGNOSIS — C50412 Malignant neoplasm of upper-outer quadrant of left female breast: Secondary | ICD-10-CM

## 2024-01-11 NOTE — Telephone Encounter (Signed)
 Pt's wife, Almira Armour answered the phone. They were given results and advised that referral sent to [facility] vascular for consult for Lymphedema sx. Their fax is [phone]. Fax confirmation received.

## 2024-02-02 ENCOUNTER — Ambulatory Visit: Payer: Federal, State, Local not specified - PPO | Admitting: Hematology and Oncology

## 2024-03-22 ENCOUNTER — Telehealth: Payer: Self-pay | Admitting: Hematology and Oncology

## 2024-03-22 NOTE — Telephone Encounter (Signed)
 Called to reschedule patient appointment to due provider pal  request. I talked  to patient and they are aware of the changes that was made to the upcoming appointment

## 2024-03-23 DIAGNOSIS — I890 Lymphedema, not elsewhere classified: Secondary | ICD-10-CM | POA: Diagnosis not present

## 2024-03-23 DIAGNOSIS — E8989 Other postprocedural endocrine and metabolic complications and disorders: Secondary | ICD-10-CM | POA: Diagnosis not present

## 2024-03-23 DIAGNOSIS — Z1331 Encounter for screening for depression: Secondary | ICD-10-CM | POA: Diagnosis not present

## 2024-03-23 DIAGNOSIS — I5032 Chronic diastolic (congestive) heart failure: Secondary | ICD-10-CM | POA: Diagnosis not present

## 2024-03-26 ENCOUNTER — Encounter (HOSPITAL_BASED_OUTPATIENT_CLINIC_OR_DEPARTMENT_OTHER)
Admission: RE | Admit: 2024-03-26 | Discharge: 2024-03-26 | Disposition: A | Discharge status: Home / Self Care | Source: Ambulatory Visit | Attending: Plastic Surgery | Admitting: Plastic Surgery

## 2024-03-26 ENCOUNTER — Encounter (HOSPITAL_BASED_OUTPATIENT_CLINIC_OR_DEPARTMENT_OTHER): Payer: Self-pay | Admitting: Plastic Surgery

## 2024-03-26 DIAGNOSIS — Z853 Personal history of malignant neoplasm of breast: Secondary | ICD-10-CM | POA: Diagnosis not present

## 2024-03-26 DIAGNOSIS — Z923 Personal history of irradiation: Secondary | ICD-10-CM | POA: Diagnosis not present

## 2024-03-26 DIAGNOSIS — Z01812 Encounter for preprocedural laboratory examination: Secondary | ICD-10-CM | POA: Diagnosis not present

## 2024-03-26 DIAGNOSIS — Z9013 Acquired absence of bilateral breasts and nipples: Secondary | ICD-10-CM | POA: Diagnosis not present

## 2024-03-26 LAB — BASIC METABOLIC PANEL WITH GFR
Anion gap: 11 (ref 5–15)
BUN: 9 mg/dL (ref 8–23)
CO2: 25 mmol/L (ref 22–32)
Calcium: 9 mg/dL (ref 8.9–10.3)
Chloride: 102 mmol/L (ref 98–111)
Creatinine, Ser: 0.7 mg/dL (ref 0.44–1.00)
GFR, Estimated: >60 mL/min (ref >60)
Glucose, Bld: 90 mg/dL (ref 70–99)
Potassium: 4.1 mmol/L (ref 3.5–5.1)
Sodium: 138 mmol/L (ref 135–145)

## 2024-03-26 MED ORDER — CHLORHEXIDINE GLUCONATE CLOTH 2 % EX PADS: 6.0000 | MEDICATED_PAD | Freq: Once | CUTANEOUS | Status: DC

## 2024-03-26 NOTE — H&P (Signed)
 Subjective Patient ID: Kelsey Mclaughlin is a 63 y.o. female.     HPI   Nearly 5 months post op right TE/ADM reconstruction. Since last visit she has been evaluated at [facility] for lymphatic bypass. Reports that they felt she is a candidate but recommended treatment at [facility].    Noted onset right breast retroareolar pain 2024. Diagnostic MMG/US  01/2023 showed  a 0.8 mass at 7 o'clock, 9 cmfn and a 0.5 cm mass 7 o'clock, 10 cmfn. No sonographic abnormalities within the retroareolar right breast noted. Right axilla normal. Biopsies labeled  right, 7 o'clock, 9 cmfn showed IDC with DCIS, ER/PR+, Her 2-, and right breast 7 o'clock, 10 cmfn intermediate grade DCIS. Patient elected for mastectomy. Final pathology right 1.2 cm IDC margins clear 1/3 LN + with micrometastasis. Oncotype 7/3%. Patient underwent immediate TE prepectoral reconstruction. Course complicated by difficulty wound healing and eventual exposure TE, removal TE. On tamoxifen .   The patient was originally diagnosed at age 57 with IDC in the left UOQ. The patient underwent lumpectomy and full axillary lymph node dissection with ALND, XRT, chemotherapy in [place]. 2/26 LN+.  Screening MMG in December 2014 demonstrated breast asymmetry and follow up biospy demonstrated mass measuring 1 x 0.4 x 1.7 cm 2 cm from the nipple. , IDC, ER/PR +, Her 2 +. She completed neoadjuvant chemotherapy. Completed arimidex  course.   She underwent left mastectomy with immediate TE/LD flap reconstruction. Final pathology with two foci IDC 1.3 and 1.0 cm, margins negative. +LVI. Course complicated by seroma and return to OR for drainage and implant exchange. She underwent subsequent Z plasty for treatment left axillary scar (prior lumpectomy ALND scar).   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    She has lymphedema of LUE and uses sleeve and compression pump.    Right mastectomy 661 g   Lives with wife who continues to work for [employer], states doing training  now. 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   Review of Systems   Objective Physical Exam  Cardiovascular: Normal rate, regular rhythm and normal heart sounds.    Pulmonary/Chest Effort normal and breath sounds normal.      Chest: Left chest absent with implant reconstruction, Baker 1 Right chest expanded soft   LUE lymphedema   Assessment/Plan History left breast cancer s/p lumpectomy ALND Adjuvant chemotherapy, left chest radiation Recurrent left breast cancer S/p left mastectomy with latissimus/TE reconstruction S/p silicone implant exchange, lipofilling left chest, and right mastopexy Scar contracture left axilla -s/p Z plasty Right breast ca LOQ ER+ S/p right mastectomy SLN, prepectoral tissue expander reconstruction S/p debridement right mastectomy flap, closure wound Exposure right TE s/p removal S/p right breast reconstruction with prepectoral TE/ADM (Alloderm)   Hold tamoxifen  until after surgery.   Plan silicone implant exchange on right, revision left reconstruction with larger implant, excision at least portion or defatting LD flap skin paddle.    Reviewed implants are not permanent devices and additional surgery related to them common. Reviewed risks implant rupture, displacement, infection requiring removal, contracture. Completed Mentor physician patient checklist with aid of spouse interpreting as document in [place].    Additional risks including but not limited to bleeding, hematoma, seroma, need for additional surgery, blood clots in legs or lungs, damage to adjacent structures, asymmetry, unacceptable cosmetic result reviewed.    Rx for Norco, Bactrim , and Robaxin  given.   RIGHT Natrelle 133S FV-13-500 ml tissue expander placed  455 ml total  fill volume saline.    LEFT Mentor Smooth Ultra High Profile 535 ml silicone implant placed in left chest.   Earlis Ranks, MD [facility] Plastic & Reconstructive  Surgery  Office/ physician access line after hours [phone]

## 2024-03-26 NOTE — Progress Notes (Signed)

## 2024-03-30 ENCOUNTER — Other Ambulatory Visit: Payer: Self-pay

## 2024-03-30 ENCOUNTER — Encounter (HOSPITAL_BASED_OUTPATIENT_CLINIC_OR_DEPARTMENT_OTHER): Payer: Self-pay | Admitting: Plastic Surgery

## 2024-03-30 ENCOUNTER — Ambulatory Visit (HOSPITAL_BASED_OUTPATIENT_CLINIC_OR_DEPARTMENT_OTHER): Admitting: Anesthesiology

## 2024-03-30 ENCOUNTER — Ambulatory Visit (HOSPITAL_BASED_OUTPATIENT_CLINIC_OR_DEPARTMENT_OTHER)
Admission: RE | Admit: 2024-03-30 | Discharge: 2024-03-30 | Disposition: A | Discharge status: Home / Self Care | Source: Ambulatory Visit | Attending: Plastic Surgery | Admitting: Plastic Surgery

## 2024-03-30 ENCOUNTER — Encounter (HOSPITAL_BASED_OUTPATIENT_CLINIC_OR_DEPARTMENT_OTHER)
Admission: RE | Disposition: A | Discharge status: Home / Self Care | Payer: Self-pay | Source: Ambulatory Visit | Attending: Plastic Surgery

## 2024-03-30 DIAGNOSIS — Z45812 Encounter for adjustment or removal of left breast implant: Secondary | ICD-10-CM | POA: Insufficient documentation

## 2024-03-30 DIAGNOSIS — I890 Lymphedema, not elsewhere classified: Secondary | ICD-10-CM | POA: Diagnosis not present

## 2024-03-30 DIAGNOSIS — Z9221 Personal history of antineoplastic chemotherapy: Secondary | ICD-10-CM | POA: Insufficient documentation

## 2024-03-30 DIAGNOSIS — I509 Heart failure, unspecified: Secondary | ICD-10-CM | POA: Insufficient documentation

## 2024-03-30 DIAGNOSIS — I110 Hypertensive heart disease with heart failure: Secondary | ICD-10-CM | POA: Insufficient documentation

## 2024-03-30 DIAGNOSIS — Z9013 Acquired absence of bilateral breasts and nipples: Secondary | ICD-10-CM | POA: Insufficient documentation

## 2024-03-30 DIAGNOSIS — Z923 Personal history of irradiation: Secondary | ICD-10-CM | POA: Diagnosis not present

## 2024-03-30 DIAGNOSIS — Z8673 Personal history of transient ischemic attack (TIA), and cerebral infarction without residual deficits: Secondary | ICD-10-CM | POA: Diagnosis not present

## 2024-03-30 DIAGNOSIS — Z853 Personal history of malignant neoplasm of breast: Secondary | ICD-10-CM | POA: Insufficient documentation

## 2024-03-30 DIAGNOSIS — Z01818 Encounter for other preprocedural examination: Secondary | ICD-10-CM

## 2024-03-30 DIAGNOSIS — Z421 Encounter for breast reconstruction following mastectomy: Secondary | ICD-10-CM | POA: Diagnosis not present

## 2024-03-30 HISTORY — PX: BREAST IMPLANT EXCHANGE: SHX6296

## 2024-03-30 HISTORY — PX: REVISION, RECONSTRUCTION, BREAST: SHX7641

## 2024-03-30 HISTORY — PX: REMOVAL OF TISSUE EXPANDER AND PLACEMENT OF IMPLANT: SHX6457

## 2024-03-30 SURGERY — REMOVAL, TISSUE EXPANDER, BREAST, WITH IMPLANT INSERTION
Anesthesia: General | Site: Chest | Laterality: Right

## 2024-03-30 MED ORDER — PHENYLEPHRINE 80 MCG/ML (10ML) SYRINGE FOR IV PUSH (FOR BLOOD PRESSURE SUPPORT)
PREFILLED_SYRINGE | INTRAVENOUS | Status: AC
  Filled 2024-03-30: qty 10

## 2024-03-30 MED ORDER — SUGAMMADEX SODIUM 200 MG/2ML IV SOLN
INTRAVENOUS | Status: DC | PRN
Start: 2024-03-30 — End: 2024-03-30
  Administered 2024-03-30: 200 mg via INTRAVENOUS

## 2024-03-30 MED ORDER — DEXAMETHASONE SODIUM PHOSPHATE 4 MG/ML IJ SOLN
INTRAMUSCULAR | Status: DC | PRN
  Administered 2024-03-30: 5 mg via INTRAVENOUS

## 2024-03-30 MED ORDER — FENTANYL CITRATE (PF) 100 MCG/2ML IJ SOLN: 25.0000 ug | INTRAMUSCULAR | Status: DC | PRN

## 2024-03-30 MED ORDER — MIDAZOLAM HCL 2 MG/2ML IJ SOLN
INTRAMUSCULAR | Status: AC
  Filled 2024-03-30: qty 2

## 2024-03-30 MED ORDER — MIDAZOLAM HCL 5 MG/5ML IJ SOLN
INTRAMUSCULAR | Status: DC | PRN
  Administered 2024-03-30: 2 mg via INTRAVENOUS

## 2024-03-30 MED ORDER — LACTATED RINGERS IV SOLN: INTRAVENOUS | Status: DC

## 2024-03-30 MED ORDER — LIDOCAINE HCL (CARDIAC) PF 100 MG/5ML IV SOSY
PREFILLED_SYRINGE | INTRAVENOUS | Status: DC | PRN
  Administered 2024-03-30: 50 mg via INTRAVENOUS

## 2024-03-30 MED ORDER — DEXAMETHASONE SODIUM PHOSPHATE 10 MG/ML IJ SOLN
INTRAMUSCULAR | Status: AC
  Filled 2024-03-30: qty 1

## 2024-03-30 MED ORDER — LIDOCAINE 2% (20 MG/ML) 5 ML SYRINGE
INTRAMUSCULAR | Status: AC
  Filled 2024-03-30: qty 5

## 2024-03-30 MED ORDER — FENTANYL CITRATE (PF) 100 MCG/2ML IJ SOLN
INTRAMUSCULAR | Status: DC | PRN
  Administered 2024-03-30: 100 ug via INTRAVENOUS

## 2024-03-30 MED ORDER — SUGAMMADEX SODIUM 200 MG/2ML IV SOLN
INTRAVENOUS | Status: AC
Start: 2024-03-30 — End: 2024-03-30
  Filled 2024-03-30: qty 2

## 2024-03-30 MED ORDER — ONDANSETRON HCL 4 MG/2ML IJ SOLN
INTRAMUSCULAR | Status: AC
  Filled 2024-03-30: qty 2

## 2024-03-30 MED ORDER — BUPIVACAINE HCL (PF) 0.5 % IJ SOLN
INTRAMUSCULAR | Status: AC
  Filled 2024-03-30: qty 30

## 2024-03-30 MED ORDER — BUPIVACAINE HCL (PF) 0.5 % IJ SOLN
INTRAMUSCULAR | Status: DC | PRN
  Administered 2024-03-30: 30 mL

## 2024-03-30 MED ORDER — SODIUM CHLORIDE 0.9 % IV SOLN
INTRAVENOUS | Status: DC | PRN
  Administered 2024-03-30: 500 mL

## 2024-03-30 MED ORDER — CELECOXIB 200 MG PO CAPS
200.0000 mg | ORAL_CAPSULE | ORAL | Status: AC
Start: 2024-03-30 — End: 2024-03-30
  Administered 2024-03-30: 200 mg via ORAL

## 2024-03-30 MED ORDER — SUCCINYLCHOLINE CHLORIDE 200 MG/10ML IV SOSY
PREFILLED_SYRINGE | INTRAVENOUS | Status: AC
  Filled 2024-03-30: qty 10

## 2024-03-30 MED ORDER — ROCURONIUM BROMIDE 10 MG/ML (PF) SYRINGE
PREFILLED_SYRINGE | INTRAVENOUS | Status: AC
  Filled 2024-03-30: qty 10

## 2024-03-30 MED ORDER — ONDANSETRON HCL 4 MG/2ML IJ SOLN: 4.0000 mg | Freq: Four times a day (QID) | INTRAMUSCULAR | Status: DC | PRN

## 2024-03-30 MED ORDER — 0.9 % SODIUM CHLORIDE (POUR BTL) OPTIME
TOPICAL | Status: DC | PRN
  Administered 2024-03-30: 1000 mL

## 2024-03-30 MED ORDER — GABAPENTIN 300 MG PO CAPS
300.0000 mg | ORAL_CAPSULE | ORAL | Status: AC
Start: 2024-03-30 — End: 2024-03-30
  Administered 2024-03-30: 300 mg via ORAL

## 2024-03-30 MED ORDER — CEFAZOLIN SODIUM-DEXTROSE 2-4 GM/100ML-% IV SOLN
2.0000 g | INTRAVENOUS | Status: AC
  Administered 2024-03-30: 2 g via INTRAVENOUS

## 2024-03-30 MED ORDER — ATROPINE SULFATE 0.4 MG/ML IV SOLN
INTRAVENOUS | Status: AC
  Filled 2024-03-30: qty 1

## 2024-03-30 MED ORDER — FENTANYL CITRATE (PF) 100 MCG/2ML IJ SOLN
INTRAMUSCULAR | Status: AC
Start: 2024-03-30 — End: 2024-03-30
  Filled 2024-03-30: qty 2

## 2024-03-30 MED ORDER — HYDROMORPHONE HCL 1 MG/ML IJ SOLN
INTRAMUSCULAR | Status: AC
Start: 2024-03-30 — End: 2024-03-30
  Filled 2024-03-30: qty 0.5

## 2024-03-30 MED ORDER — ONDANSETRON HCL 4 MG/2ML IJ SOLN
INTRAMUSCULAR | Status: DC | PRN
  Administered 2024-03-30: 4 mg via INTRAVENOUS

## 2024-03-30 MED ORDER — OXYCODONE HCL 5 MG/5ML PO SOLN: 5.0000 mg | Freq: Once | ORAL | Status: DC | PRN

## 2024-03-30 MED ORDER — CELECOXIB 200 MG PO CAPS
ORAL_CAPSULE | ORAL | Status: AC
  Filled 2024-03-30: qty 1

## 2024-03-30 MED ORDER — ACETAMINOPHEN 500 MG PO TABS
ORAL_TABLET | ORAL | Status: AC
  Filled 2024-03-30: qty 2

## 2024-03-30 MED ORDER — PROPOFOL 10 MG/ML IV BOLUS
INTRAVENOUS | Status: DC | PRN
  Administered 2024-03-30: 200 mg via INTRAVENOUS

## 2024-03-30 MED ORDER — SODIUM CHLORIDE 0.9 % IV SOLN
INTRAVENOUS | Status: AC
  Filled 2024-03-30: qty 10

## 2024-03-30 MED ORDER — OXYCODONE HCL 5 MG PO TABS: 5.0000 mg | ORAL_TABLET | Freq: Once | ORAL | Status: DC | PRN

## 2024-03-30 MED ORDER — FENTANYL CITRATE (PF) 100 MCG/2ML IJ SOLN
INTRAMUSCULAR | Status: AC
  Filled 2024-03-30: qty 2

## 2024-03-30 MED ORDER — ROCURONIUM BROMIDE 100 MG/10ML IV SOLN
INTRAVENOUS | Status: DC | PRN
  Administered 2024-03-30: 60 mg via INTRAVENOUS

## 2024-03-30 MED ORDER — ACETAMINOPHEN 500 MG PO TABS
1000.0000 mg | ORAL_TABLET | ORAL | Status: AC
  Administered 2024-03-30: 1000 mg via ORAL

## 2024-03-30 MED ORDER — CEFAZOLIN SODIUM-DEXTROSE 2-4 GM/100ML-% IV SOLN
INTRAVENOUS | Status: AC
  Filled 2024-03-30: qty 100

## 2024-03-30 MED ORDER — EPHEDRINE 5 MG/ML INJ
INTRAVENOUS | Status: AC
  Filled 2024-03-30: qty 5

## 2024-03-30 MED ORDER — GABAPENTIN 300 MG PO CAPS
ORAL_CAPSULE | ORAL | Status: AC
  Filled 2024-03-30: qty 1

## 2024-03-30 SURGICAL SUPPLY — 64 items
BAG DECANTER FOR FLEXI CONT (MISCELLANEOUS) ×2 IMPLANT
BINDER BREAST LRG (GAUZE/BANDAGES/DRESSINGS) IMPLANT
BINDER BREAST MEDIUM (GAUZE/BANDAGES/DRESSINGS) IMPLANT
BINDER BREAST XLRG (GAUZE/BANDAGES/DRESSINGS) IMPLANT
BINDER BREAST XXLRG (GAUZE/BANDAGES/DRESSINGS) IMPLANT
BLADE SURG 10 STRL SS (BLADE) ×2 IMPLANT
BLADE SURG 15 STRL LF DISP TIS (BLADE) IMPLANT
BNDG GAUZE DERMACEA FLUFF 4 (GAUZE/BANDAGES/DRESSINGS) ×4 IMPLANT
CANISTER SUCT 1200ML W/VALVE (MISCELLANEOUS) ×2 IMPLANT
CHLORAPREP W/TINT 26 (MISCELLANEOUS) ×2 IMPLANT
COVER BACK TABLE 60X90IN (DRAPES) ×2 IMPLANT
COVER MAYO STAND STRL (DRAPES) ×2 IMPLANT
DRAIN CHANNEL 15F RND FF W/TCR (WOUND CARE) IMPLANT
DRAPE INCISE IOBAN 66X45 STRL (DRAPES) IMPLANT
DRAPE TOP ARMCOVERS (MISCELLANEOUS) ×2 IMPLANT
DRAPE U-SHAPE 76X120 STRL (DRAPES) ×2 IMPLANT
DRAPE UTILITY XL STRL (DRAPES) ×2 IMPLANT
ELECT COATED BLADE 2.86 ST (ELECTRODE) ×2 IMPLANT
ELECTRODE BLDE 4.0 EZ CLN MEGD (MISCELLANEOUS) ×2 IMPLANT
ELECTRODE REM PT RTRN 9FT ADLT (ELECTROSURGICAL) ×2 IMPLANT
EVACUATOR SILICONE 100CC (DRAIN) IMPLANT
FUNNEL KELLER 2 DISP (MISCELLANEOUS) IMPLANT
GAUZE PAD ABD 8X10 STRL (GAUZE/BANDAGES/DRESSINGS) ×4 IMPLANT
GLOVE BIO SURGEON STRL SZ 6 (GLOVE) ×4 IMPLANT
GLOVE BIO SURGEON STRL SZ 6.5 (GLOVE) IMPLANT
GOWN STRL REUS W/ TWL LRG LVL3 (GOWN DISPOSABLE) ×4 IMPLANT
IMPL BREAST GEL HP 650CC (Breast) IMPLANT
IMPL BREAST SILICONE 595CC (Breast) IMPLANT
IV NS 1000ML BAXH (IV SOLUTION) IMPLANT
IV NS 500ML BAXH (IV SOLUTION) IMPLANT
KIT FILL ASEPTIC TRANSFER (MISCELLANEOUS) IMPLANT
MARKER SKIN DUAL TIP RULER LAB (MISCELLANEOUS) IMPLANT
NDL FILTER BLUNT 18X1 1/2 (NEEDLE) IMPLANT
NDL HYPO 25X1 1.5 SAFETY (NEEDLE) IMPLANT
NEEDLE FILTER BLUNT 18X1 1/2 (NEEDLE) IMPLANT
NEEDLE HYPO 25X1 1.5 SAFETY (NEEDLE) IMPLANT
PACK BASIN DAY SURGERY FS (CUSTOM PROCEDURE TRAY) ×2 IMPLANT
PENCIL SMOKE EVACUATOR (MISCELLANEOUS) ×2 IMPLANT
PIN SAFETY STERILE (MISCELLANEOUS) ×2 IMPLANT
SHEET MEDIUM DRAPE 40X70 STRL (DRAPES) ×4 IMPLANT
SIZER BRST REUSE 595CC (SIZER) IMPLANT
SIZER BRST REUSE P6.3XHI 560CC (SIZER) IMPLANT
SIZER BRST REUSE P6.5XHI 650CC (SIZER) IMPLANT
SLEEVE SCD COMPRESS KNEE MED (STOCKING) ×2 IMPLANT
SPIKE FLUID TRANSFER (MISCELLANEOUS) IMPLANT
SPONGE T-LAP 18X18 ~~LOC~~+RFID (SPONGE) ×4 IMPLANT
STAPLER SKIN PROX WIDE 3.9 (STAPLE) ×2 IMPLANT
STRIP CLOSURE SKIN 1/2X4 (GAUZE/BANDAGES/DRESSINGS) IMPLANT
SUT ETHILON 2 0 FS 18 (SUTURE) IMPLANT
SUT MNCRL AB 4-0 PS2 18 (SUTURE) ×2 IMPLANT
SUT PDS AB 2-0 CT2 27 (SUTURE) IMPLANT
SUT PDS II 3-0 CT2 27 ABS (SUTURE) IMPLANT
SUT SILK 2 0 SH (SUTURE) IMPLANT
SUT VIC AB 3-0 SH 27X BRD (SUTURE) ×2 IMPLANT
SUT VIC AB 4-0 PS2 18 (SUTURE) ×2 IMPLANT
SUT VIC AB 4-0 PS2 27 (SUTURE) IMPLANT
SYR 20ML LL LF (SYRINGE) IMPLANT
SYR 50ML LL SCALE MARK (SYRINGE) IMPLANT
SYR BULB IRRIG 60ML STRL (SYRINGE) ×4 IMPLANT
SYR CONTROL 10ML LL (SYRINGE) IMPLANT
TOWEL GREEN STERILE FF (TOWEL DISPOSABLE) ×4 IMPLANT
TUBE CONNECTING 20X1/4 (TUBING) ×4 IMPLANT
UNDERPAD 30X36 HEAVY ABSORB (UNDERPADS AND DIAPERS) ×4 IMPLANT
YANKAUER SUCT BULB TIP NO VENT (SUCTIONS) ×2 IMPLANT

## 2024-03-30 NOTE — Discharge Instructions (Signed)
  Post Anesthesia Home Care Instructions  Activity: Get plenty of rest for the remainder of the day. A responsible individual must stay with you for 24 hours following the procedure.  For the next 24 hours, DO NOT: -Drive a car -Advertising copywriter -Drink alcoholic beverages -Take any medication unless instructed by your physician -Make any legal decisions or sign important papers.  Meals: Start with liquid foods such as gelatin or soup. Progress to regular foods as tolerated. Avoid greasy, spicy, heavy foods. If nausea and/or vomiting occur, drink only clear liquids until the nausea and/or vomiting subsides. Call your physician if vomiting continues.  Special Instructions/Symptoms: Your throat may feel dry or sore from the anesthesia or the breathing tube placed in your throat during surgery. If this causes discomfort, gargle with warm salt water. The discomfort should disappear within 24 hours.  If you had a scopolamine  patch placed behind your ear for the management of post- operative nausea and/or vomiting:  1. The medication in the patch is effective for 72 hours, after which it should be removed.  Wrap patch in a tissue and discard in the trash. Wash hands thoroughly with soap and water. 2. You may remove the patch earlier than 72 hours if you experience unpleasant side effects which may include dry mouth, dizziness or visual disturbances. 3. Avoid touching the patch. Wash your hands with soap and water after contact with the patch.  No tylenol  or ibuprofen until after 12:45 if needed.

## 2024-03-30 NOTE — Interval H&P Note (Signed)
 History and Physical Interval Note:  03/30/2024 6:51 AM  Kelsey Mclaughlin  has presented today for surgery, with the diagnosis of history bilateral breast cancer acquired absence breast history therapeutic radiation.  The various methods of treatment have been discussed with the patient and family. After consideration of risks, benefits and other options for treatment, the patient has consented to  Procedure(s) with comments: REMOVAL, TISSUE EXPANDER, BREAST, WITH IMPLANT INSERTION (Right) REVISION, RECONSTRUCTION, BREAST (Left) REPLACEMENT, IMPLANT, BREAST (Left) - *SILICONE IMPLANT EXCHANGE* with adjacent tissue transfer as a surgical intervention.  The patient's history has been reviewed, patient examined, no change in status, stable for surgery.  I have reviewed the patient's chart and labs.  Questions were answered to the patient's satisfaction.     Earlis Jahdai Padovano

## 2024-03-30 NOTE — Anesthesia Procedure Notes (Signed)
 Procedure Name: Intubation Date/Time: 03/30/2024 7:36 AM  Performed by: Emilio Rock BIRCH, CRNAPre-anesthesia Checklist: Patient identified, Emergency Drugs available, Suction available and Patient being monitored Patient Re-evaluated:Patient Re-evaluated prior to induction Oxygen Delivery Method: Circle system utilized Preoxygenation: Pre-oxygenation with 100% oxygen Induction Type: IV induction Ventilation: Mask ventilation without difficulty Laryngoscope Size: Mac and 3 Grade View: Grade I Tube type: Oral Tube size: 7.0 mm Number of attempts: 1 Airway Equipment and Method: Stylet and Oral airway Placement Confirmation: ETT inserted through vocal cords under direct vision, positive ETCO2 and breath sounds checked- equal and bilateral Secured at: 22 cm Tube secured with: Tape Dental Injury: Teeth and Oropharynx as per pre-operative assessment

## 2024-03-30 NOTE — Transfer of Care (Signed)
 Immediate Anesthesia Transfer of Care Note  Patient: Kelsey Mclaughlin  Procedure(s) Performed: REMOVAL RIGHT BREAST TISSUE EXPANDER AND INSERTION OF IMPLANT (Right: Chest) REVISION LEFT BREAST RECONSTRUCTION (Left: Chest) EXCHANGE LEFT BREAST IMPLANT (Left: Chest)  Patient Location: PACU  Anesthesia Type:General  Level of Consciousness: awake, alert , oriented, drowsy, and patient cooperative  Airway & Oxygen Therapy: Patient Spontanous Breathing and Patient connected to face mask oxygen  Post-op Assessment: Report given to RN and Post -op Vital signs reviewed and stable  Post vital signs: Reviewed and stable  Last Vitals:  Vitals Value Taken Time  BP    Temp    Pulse 67 03/30/24 09:27  Resp 11 03/30/24 09:27  SpO2 100 % 03/30/24 09:27  Vitals shown include unfiled device data.  Last Pain:  Vitals:   03/30/24 0637  TempSrc: Temporal  PainSc: 0-No pain      Patients Stated Pain Goal: 3 (03/30/24 9362)  Complications: No notable events documented.

## 2024-03-30 NOTE — Op Note (Signed)
 Operative Note   DATE OF OPERATION: 7.18.2025  LOCATION: [facility]-outpatient  SURGICAL DIVISION: Plastic Surgery  PREOPERATIVE DIAGNOSES:  1. History breast cancer 2. Acquired absence breasts 3. History therapeutic radiation  POSTOPERATIVE DIAGNOSES:  same  PROCEDURE:  1. Removal right chest tissue expander and placement silicone implant 2. Revision left breast reconstruction with silicone implant exchange  SURGEON: Earlis Ranks MD MBA  ASSISTANT: none  ANESTHESIA:  General.   EBL: 15 ml  COMPLICATIONS: None immediate.   INDICATIONS FOR PROCEDURE:  The patient, Kelsey Mclaughlin, is a 63 y.o. female born on March 13, 1961, is here for staged breast reconstruction. Patient most recently underwent right mastectomy and expander based reconstruction. She has a prior history left latissimus dorsi flap and implant based reconstruction. She desires larger volume implant over left and plan excision protuberant latissimus flap skin paddle.    FINDINGS: Removed intact smooth round silicone implant left chest. Placed Mentor Xtra smooth round high profile implants bilateral. RIGHT 650 ml REF SHPX-650 [id number] LEFT 595 ml REF [id number] [id number]  DESCRIPTION OF PROCEDURE:  The patient's operative site was marked with the patient in the preoperative area. The patient was taken to the operating room. SCDs were placed and IV antibiotics were given. The patient's operative site was prepped and draped in a sterile fashion. A time out was performed and all information was confirmed to be correct. Incision made in right inframammary fold scar and carried through superficial fascia and acellular dermis to implant cavity. Expander removed. Capsulotomy performed medially and superiorly. Additional scoring anterior capsule performed. Sizer placed. I then directed my attention to left chest. Caudal portion latissimus dorsi skin paddle excised. Incision carried through fascia to latissimus muscle.  Muscle divided in direction muscle fibers. Implant removed. Capsulotomies performed superiorly and laterally. Sizer placed. Patient brought to upright sitting position. A high profile 650 ml implant selected for right chest and 595 implant for left chest. Patient returned to supine position. Each cavity irrigated with saline solution containing Ancef  gentamicin  and Betadine . Hemostasis ensured. Local anesthetic infiltrated in each cavity. Implant placed with Keller funnel into left chest. Implant orientation ensured. Closure completed with 3-0 vicryl in latissimus muscle, 4-0 vicryl in dermis, and 4-0 monocryl subcuticular skin closure. Over right chest, implant placed with Keller funnel. Implant orientation ensured. Closure completed with 3-0 vicryl in superficial fascia and acellular dermis, 4-0 vicryl in dermis, and 4-0 monocryl subcuticular skin closure. Steri strips applied to incisions. Dry dressing and breast binder applied.   The patient was allowed to wake from anesthesia, extubated and taken to the recovery room in satisfactory condition.   SPECIMENS: none  DRAINS: none  Earlis Ranks, MD [facility] Plastic & Reconstructive Surgery  Office/ physician access line after hours [phone]

## 2024-03-30 NOTE — Anesthesia Postprocedure Evaluation (Signed)
 Anesthesia Post Note  Patient: Kelsey Mclaughlin  Procedure(s) Performed: REMOVAL RIGHT BREAST TISSUE EXPANDER AND INSERTION OF IMPLANT (Right: Chest) REVISION LEFT BREAST RECONSTRUCTION (Left: Chest) EXCHANGE LEFT BREAST IMPLANT (Left: Chest)     Patient location during evaluation: PACU Anesthesia Type: General Level of consciousness: awake and alert Pain management: pain level controlled Vital Signs Assessment: post-procedure vital signs reviewed and stable Respiratory status: spontaneous breathing, nonlabored ventilation, respiratory function stable and patient connected to nasal cannula oxygen Cardiovascular status: blood pressure returned to baseline and stable Postop Assessment: no apparent nausea or vomiting Anesthetic complications: no   No notable events documented.  Last Vitals:  Vitals:   03/30/24 1029 03/30/24 1037  BP: (!) (P) 171/96 (!) 171/96  Pulse: (P) 70 70  Resp: (P) 18 16  Temp: (P) 36.4 C 36.4 C  SpO2:  95%    Last Pain:  Vitals:   03/30/24 1037  TempSrc:   PainSc: 0-No pain                 Maybel Dambrosio S

## 2024-03-30 NOTE — Anesthesia Preprocedure Evaluation (Signed)
 Anesthesia Evaluation  Patient identified by MRN, date of birth, ID band Patient awake    Reviewed: Allergy & Precautions, H&P , NPO status , Patient's Chart, lab work & pertinent test results  Airway Mallampati: II   Neck ROM: full    Dental   Pulmonary neg pulmonary ROS   breath sounds clear to auscultation       Cardiovascular hypertension, +CHF   Rhythm:regular Rate:Normal     Neuro/Psych  PSYCHIATRIC DISORDERS  Depression    TIA   GI/Hepatic   Endo/Other    Renal/GU      Musculoskeletal   Abdominal   Peds  Hematology   Anesthesia Other Findings   Reproductive/Obstetrics H/o left breast CA                              Anesthesia Physical Anesthesia Plan  ASA: 2  Anesthesia Plan: General   Post-op Pain Management:    Induction: Intravenous  PONV Risk Score and Plan: 3 and Ondansetron , Dexamethasone , Midazolam  and Treatment may vary due to age or medical condition  Airway Management Planned: Oral ETT  Additional Equipment:   Intra-op Plan:   Post-operative Plan: Extubation in OR  Informed Consent: I have reviewed the patients History and Physical, chart, labs and discussed the procedure including the risks, benefits and alternatives for the proposed anesthesia with the patient or authorized representative who has indicated his/her understanding and acceptance.     Dental advisory given  Plan Discussed with: CRNA, Anesthesiologist and Surgeon  Anesthesia Plan Comments:         Anesthesia Quick Evaluation

## 2024-04-02 ENCOUNTER — Encounter (HOSPITAL_BASED_OUTPATIENT_CLINIC_OR_DEPARTMENT_OTHER): Payer: Self-pay | Admitting: Plastic Surgery

## 2024-04-09 DIAGNOSIS — Z9013 Acquired absence of bilateral breasts and nipples: Secondary | ICD-10-CM | POA: Diagnosis not present

## 2024-04-09 DIAGNOSIS — Z853 Personal history of malignant neoplasm of breast: Secondary | ICD-10-CM | POA: Diagnosis not present

## 2024-04-09 DIAGNOSIS — E8989 Other postprocedural endocrine and metabolic complications and disorders: Secondary | ICD-10-CM | POA: Diagnosis not present

## 2024-04-09 DIAGNOSIS — I890 Lymphedema, not elsewhere classified: Secondary | ICD-10-CM | POA: Diagnosis not present

## 2024-04-09 DIAGNOSIS — Z923 Personal history of irradiation: Secondary | ICD-10-CM | POA: Diagnosis not present

## 2024-04-12 DIAGNOSIS — I890 Lymphedema, not elsewhere classified: Secondary | ICD-10-CM | POA: Diagnosis not present

## 2024-04-12 DIAGNOSIS — E8989 Other postprocedural endocrine and metabolic complications and disorders: Secondary | ICD-10-CM | POA: Diagnosis not present

## 2024-04-23 DIAGNOSIS — E8989 Other postprocedural endocrine and metabolic complications and disorders: Secondary | ICD-10-CM | POA: Diagnosis not present

## 2024-04-23 DIAGNOSIS — I5032 Chronic diastolic (congestive) heart failure: Secondary | ICD-10-CM | POA: Diagnosis not present

## 2024-04-23 DIAGNOSIS — I890 Lymphedema, not elsewhere classified: Secondary | ICD-10-CM | POA: Diagnosis not present

## 2024-04-23 DIAGNOSIS — F3342 Major depressive disorder, recurrent, in full remission: Secondary | ICD-10-CM | POA: Diagnosis not present

## 2024-05-02 DIAGNOSIS — Z923 Personal history of irradiation: Secondary | ICD-10-CM | POA: Diagnosis not present

## 2024-05-02 DIAGNOSIS — Z853 Personal history of malignant neoplasm of breast: Secondary | ICD-10-CM | POA: Diagnosis not present

## 2024-05-02 DIAGNOSIS — Z9013 Acquired absence of bilateral breasts and nipples: Secondary | ICD-10-CM | POA: Diagnosis not present

## 2024-06-13 DIAGNOSIS — Z923 Personal history of irradiation: Secondary | ICD-10-CM | POA: Diagnosis not present

## 2024-06-13 DIAGNOSIS — Z853 Personal history of malignant neoplasm of breast: Secondary | ICD-10-CM | POA: Diagnosis not present

## 2024-06-13 DIAGNOSIS — Z9013 Acquired absence of bilateral breasts and nipples: Secondary | ICD-10-CM | POA: Diagnosis not present

## 2024-06-13 NOTE — H&P (Signed)
 Subjective Patient ID: Kelsey Mclaughlin is a 63 y.o. female.     HPI     2.5 mo postop right implant exchange. Plan right nipple areola reconstruction.    Noted onset right breast retroareolar pain 2024. Diagnostic MMG/US  01/2023 showed  a 0.8 mass at 7 o'clock, 9 cmfn and a 0.5 cm mass 7 o'clock, 10 cmfn. No sonographic abnormalities within the retroareolar right breast noted. Right axilla normal. Biopsies labeled  right, 7 o'clock, 9 cmfn showed IDC with DCIS, ER/PR+, Her 2-, and right breast 7 o'clock, 10 cmfn intermediate grade DCIS. Patient elected for mastectomy. Final pathology right 1.2 cm IDC margins clear 1/3 LN + with micrometastasis. Oncotype 7/3%. Patient underwent immediate TE prepectoral reconstruction. Course complicated by difficulty wound healing and eventual exposure TE, removal TE. On tamoxifen .   The patient was originally diagnosed at age 69 with IDC in the left UOQ. The patient underwent lumpectomy and full axillary lymph node dissection with ALND, XRT, chemotherapy in [place]. 2/26 LN+.  Screening MMG in December 2014 demonstrated breast asymmetry and follow up biospy demonstrated mass measuring 1 x 0.4 x 1.7 cm 2 cm from the nipple. , IDC, ER/PR +, Her 2 +. She completed neoadjuvant chemotherapy. Completed arimidex  course.   She underwent left mastectomy with immediate TE/LD flap reconstruction. Final pathology with two foci IDC 1.3 and 1.0 cm, margins negative. +LVI. Course complicated by seroma and return to OR for drainage and implant exchange. She underwent subsequent Z plasty for treatment left axillary scar (prior lumpectomy ALND scar).   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    She has lymphedema of LUE and uses sleeve and compression pump.    Right mastectomy 661 g   Lives with wife who continues to work for [employer], states doing training now. 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   Review of Systems    Objective Physical Exam  Cardiovascular: Normal rate, regular rhythm and normal heart sounds.    Pulmonary/Chest Effort normal and breath sounds normal.     Chest: Scars maturing soft bilateral No rippling No lateral malposition in supine position   LUE lymphedema   Assessment/Plan History left breast cancer s/p lumpectomy ALND Adjuvant chemotherapy, left chest radiation Recurrent left breast cancer S/p left mastectomy with latissimus/TE reconstruction S/p silicone implant exchange, lipofilling left chest, and right mastopexy S/p left NAC reconstruction with local flaps, FTSG, lipofiling left chest Tattoo left NAC Scar contracture left axilla -s/p Z plasty Right breast ca LOQ ER+ S/p right mastectomy SLN, prepectoral tissue expander reconstruction S/p debridement right mastectomy flap, closure wound Exposure right TE s/p removal S/p right breast reconstruction with prepectoral TE/ADM (Alloderm) S/p removal right TE placement silicone implant, revision left breast reconstruction with silicone implant exchange and excision skin LD skin paddle   Pictures today. Phone visit completed last month with [facility] for lymphedema. Plan in person consultation, this is scheduled for next week..   Patient desires to proceed with right NAC reconstruction with local flap and full thickness skin graft. Plan right donor site graft. Reviewed OP surgery, bolster dressing, post operative limitations. Reviewed risks graft failure, loss height nipple, need for additional procedures such as tattoo.    Rx for Norco and Robaxin  given.    Earlis Ranks, MD [facility] Plastic & Reconstructive Surgery  Office/ physician access line after hours [phone]     Mentor Xtra smooth round high profile implants bilateral.  RIGHT 650 ml  REF SHPX-650 [id number]  LEFT 595 ml REF SHXP-595 SN [id number]

## 2024-07-06 ENCOUNTER — Other Ambulatory Visit: Payer: Self-pay

## 2024-07-06 ENCOUNTER — Ambulatory Visit: Admitting: Hematology and Oncology

## 2024-07-06 ENCOUNTER — Encounter (HOSPITAL_BASED_OUTPATIENT_CLINIC_OR_DEPARTMENT_OTHER)
Admission: RE | Admit: 2024-07-06 | Discharge: 2024-07-06 | Disposition: A | Discharge status: Home / Self Care | Source: Ambulatory Visit | Attending: Plastic Surgery | Admitting: Plastic Surgery

## 2024-07-06 ENCOUNTER — Encounter (HOSPITAL_BASED_OUTPATIENT_CLINIC_OR_DEPARTMENT_OTHER): Payer: Self-pay | Admitting: Plastic Surgery

## 2024-07-06 DIAGNOSIS — I498 Other specified cardiac arrhythmias: Secondary | ICD-10-CM | POA: Diagnosis not present

## 2024-07-06 DIAGNOSIS — Z01818 Encounter for other preprocedural examination: Secondary | ICD-10-CM | POA: Diagnosis not present

## 2024-07-06 LAB — BASIC METABOLIC PANEL WITH GFR
Anion gap: 11 (ref 5–15)
BUN: 9 mg/dL (ref 8–23)
CO2: 22 mmol/L (ref 22–32)
Calcium: 8.9 mg/dL (ref 8.9–10.3)
Chloride: 104 mmol/L (ref 98–111)
Creatinine, Ser: 0.64 mg/dL (ref 0.44–1.00)
GFR, Estimated: >60 mL/min (ref >60)
Glucose, Bld: 108 mg/dL — ABNORMAL HIGH (ref 70–99)
Potassium: 4.1 mmol/L (ref 3.5–5.1)
Sodium: 137 mmol/L (ref 135–145)

## 2024-07-06 MED ORDER — CHLORHEXIDINE GLUCONATE CLOTH 2 % EX PADS: 6.0000 | MEDICATED_PAD | Freq: Once | CUTANEOUS | Status: DC

## 2024-07-06 NOTE — Progress Notes (Signed)

## 2024-07-09 ENCOUNTER — Inpatient Hospital Stay: Attending: Hematology and Oncology | Admitting: Hematology and Oncology

## 2024-07-09 VITALS — BP 140/62 | HR 59 | Temp 97.4°F | Resp 16 | Wt 193.8 lb

## 2024-07-09 DIAGNOSIS — Z170 Estrogen receptor positive status [ER+]: Secondary | ICD-10-CM | POA: Diagnosis not present

## 2024-07-09 DIAGNOSIS — Z79810 Long term (current) use of selective estrogen receptor modulators (SERMs): Secondary | ICD-10-CM | POA: Diagnosis not present

## 2024-07-09 DIAGNOSIS — C50412 Malignant neoplasm of upper-outer quadrant of left female breast: Secondary | ICD-10-CM

## 2024-07-09 DIAGNOSIS — Z803 Family history of malignant neoplasm of breast: Secondary | ICD-10-CM | POA: Diagnosis not present

## 2024-07-09 DIAGNOSIS — Z79899 Other long term (current) drug therapy: Secondary | ICD-10-CM | POA: Insufficient documentation

## 2024-07-09 DIAGNOSIS — I890 Lymphedema, not elsewhere classified: Secondary | ICD-10-CM | POA: Diagnosis not present

## 2024-07-09 DIAGNOSIS — Z1721 Progesterone receptor positive status: Secondary | ICD-10-CM | POA: Diagnosis not present

## 2024-07-09 DIAGNOSIS — F32A Depression, unspecified: Secondary | ICD-10-CM | POA: Insufficient documentation

## 2024-07-09 DIAGNOSIS — Z9013 Acquired absence of bilateral breasts and nipples: Secondary | ICD-10-CM | POA: Insufficient documentation

## 2024-07-09 DIAGNOSIS — M8580 Other specified disorders of bone density and structure, unspecified site: Secondary | ICD-10-CM | POA: Diagnosis not present

## 2024-07-09 DIAGNOSIS — Z808 Family history of malignant neoplasm of other organs or systems: Secondary | ICD-10-CM | POA: Insufficient documentation

## 2024-07-09 DIAGNOSIS — Z853 Personal history of malignant neoplasm of breast: Secondary | ICD-10-CM | POA: Insufficient documentation

## 2024-07-09 DIAGNOSIS — C50512 Malignant neoplasm of lower-outer quadrant of left female breast: Secondary | ICD-10-CM | POA: Insufficient documentation

## 2024-07-09 NOTE — Progress Notes (Signed)
 [facility]  [phone] [phone]     ID: Kelsey Mclaughlin OB: 1960/12/08  MR#: [id number]  RDW#:[id number]  Patient Care Team: Sun, Vyvyan, MD as PCP - General (Family Medicine) [name], MD as Referring Physician (Family Medicine) [name], MD as Consulting Physician (Plastic Surgery) [name], MD as Referring Physician (Obstetrics and Gynecology) [name], MD as Consulting Physician (Hematology and Oncology) OTHER MD: Berwyn Grippe MD, Emilio Aurora MD  CHIEF COMPLAINT: Breast cancer, recurrent, estrogen receptor positive  CURRENT TREATMENT: observation  INTERVAL HISTORY:  Discussed the use of AI scribe software for clinical note transcription with the patient, who gave verbal consent to proceed.  History of Present Illness         The patient, with a history of bilateral mastectomy and lymphedema, presents for a follow-up on exemestane .   Discussed the use of AI scribe software for clinical note transcription with the patient, who gave verbal consent to proceed.  History of Present Illness Kelsey Mclaughlin is a 63 year old female with breast cancer while on tamoxifen .    Rest of the pertinent 10 point ROS reviewed and negative   COVID 19 VACCINATION STATUS: fully vaccinated +1 booster   BREAST CANCER HISTORY: From doctor [name]'s intake note 01/30/2014:  Kelsey Mclaughlin is a 63 y.o. female. In 1998 at the age of 89 was diagnosed with left breast cancer in [place]. This was an invasive carcinoma grade 2 stage II. At that time she underwent a lumpectomy with excellent lymph node dissection. She received 6 months of chemotherapy. [name] She also had radiation therapy to the left breast. 2011 patient noticed a lump in the outside of her left breast. She had ultrasound workup performed and was told it was negative no  biopsies were performed. General 2014 after patient moved to the [place]  she had a mammogram performed this revealed a possible mass in the outer quadrant of the left breast. Ultrasound showed it to be 1.7 cm. MRI of the breasts performed on January 30 revealed the mass to be 1.5 x 2.2 x 1.5 cm. Because of this she also had a biopsy performed on 10/04/2013. The biopsy revealed [[id number]) an invasive ductal carcinoma with ductal carcinoma in situ grade 2. Prognostic panel was positive for estrogen receptor 100% megestrol receptor +33% proliferation marker Ki-67 15% and the tumor was HER-2/neu positive [with a signals ratio of 3.1 and number per cell 6.3]  The patient's subsequent history is as detailed below   PAST MEDICAL HISTORY: Past Medical History:  Diagnosis Date   Bronchitis    completed antibiotics 10/21/13/ states resolved   Cancer (HCC) 1998, 2015   Left Breast   Chronic diastolic (congestive) heart failure (HCC)    Fatty liver    Hypertension    Lymph edema    lt arm   Stroke (HCC) 2004   TIA-  no problems since    PAST SURGICAL HISTORY: Past Surgical History:  Procedure Laterality Date   ABDOMINOPLASTY/PANNICULECTOMY     BREAST BIOPSY Right 02/16/2023   US  RT BREAST BX W LOC DEV EA ADD LESION IMG BX SPEC US  GUIDE 02/16/2023 [facility]   BREAST BIOPSY Right 02/16/2023   US  RT BREAST BX W LOC DEV 1ST LESION IMG BX SPEC US  GUIDE 02/16/2023 [facility]   BREAST IMPLANT EXCHANGE Left 09/03/2014   Procedure: LEFT REMOVAL  TISSUE EXPANDERS WITH PLACEMENT OF SILICONE IMPLANT ;  Surgeon: Earlis Ranks, MD;  Location: [facility];  Service: Plastics;  Laterality: Left;   BREAST IMPLANT EXCHANGE Left 03/30/2024   Procedure: EXCHANGE LEFT BREAST IMPLANT;  Surgeon: Ranks Earlis, MD;  Location: [facility];  Service: Plastics;  Laterality: Left;   BREAST IMPLANT REMOVAL Right 08/09/2023   Procedure: REMOVAL RIGHT CHEST TISSUE EXPANDER;   Surgeon: Ranks Earlis, MD;  Location: [facility];  Service: Plastics;  Laterality: Right;   BREAST RECONSTRUCTION Left 03/07/2015   Procedure: LEFT NIPPLE AEROLA CREATION WITH LOCAL FLAP/FULL THICKNESS SKIN GRAFT FROM GROIN;  Surgeon: Earlis Ranks, MD;  Location: [facility];  Service: Plastics;  Laterality: Left;   BREAST RECONSTRUCTION WITH PLACEMENT OF TISSUE EXPANDER AND ALLODERM Right 04/05/2023   Procedure: BREAST RECONSTRUCTION WITH PLACEMENT OF TISSUE EXPANDER;  Surgeon: Ranks Earlis, MD;  Location: [facility];  Service: Plastics;  Laterality: Right;   BREAST RECONSTRUCTION WITH PLACEMENT OF TISSUE EXPANDER AND ALLODERM Right 11/04/2023   Procedure: BREAST RECONSTRUCTION WITH PLACEMENT OF TISSUE EXPANDER AND ALLODERM;  Surgeon: Ranks Earlis, MD;  Location: [facility];  Service: Plastics;  Laterality: Right;   BREAST SURGERY  1998   lumpectomy on left and removed lymphnodes in [place]   BUNIONECTOMY Left 07/31/2020   Procedure: Left Lapidus and Modified Woodard Edwards;  Surgeon: Kit Rush, MD;  Location: [facility];  Service: Orthopedics;  Laterality: Left;   DEBRIDEMENT AND CLOSURE WOUND Right 05/11/2023   Procedure: DEBRIDEMENT AND CLOSURE WOUND  RIGHT MASTECTOMY FLAP;  Surgeon: Ranks Earlis, MD;  Location: [facility];  Service: Plastics;  Laterality: Right;   HYDRADENITIS EXCISION Left 05/30/2018   Procedure: Adjacent tissue transfer left axilla;  Surgeon: Ranks Earlis, MD;  Location: [facility];  Service: Plastics;  Laterality: Left;   LATISSIMUS FLAP TO BREAST Left 04/12/2014   Procedure: LEFT LATISSIMUS DORSI FLAP FOR LEFT BREAST RECONSTRUCTION AND PLACEMENT OF TISSUE EXPANDER;  Surgeon: Earlis Ranks, MD;  [facility];  Service: Plastics;  Laterality: Left;   LIPOSUCTION WITH LIPOFILLING Left 09/03/2014   Procedure:  LIPOFILLING TO LEFT CHEST   ;  Surgeon: Earlis Ranks, MD;  Location: [facility];  Service: Plastics;  Laterality: Left;   LIPOSUCTION WITH LIPOFILLING Left 03/07/2015   Procedure: LIPOFILLING TO LEFT BREAST;  Surgeon: Earlis Ranks, MD;  Location: [facility];  Service: Plastics;  Laterality: Left;   MASTECTOMY Left 2015   MASTECTOMY W/ SENTINEL NODE BIOPSY Right 04/05/2023   Procedure: RIGHT MASTECTOMY WITH SENTINEL LYMPH NODE BIOPSY;  Surgeon: Belinda Cough, MD;  Location: [facility];  Service: General;  Laterality: Right;  PEC BLOCK   MASTOPEXY Right 09/03/2014   Procedure: RIGHT BREAST MASTOPEXY ;  Surgeon: Earlis Ranks, MD;  Location: [facility];  Service: Plastics;  Laterality: Right;   PORT-A-CATH REMOVAL Right 12/24/2014   Procedure: REMOVAL PORT-A-CATH;  Surgeon: Cough Belinda, MD;  Location: [facility];  Service: General;  Laterality: Right;   PORTACATH PLACEMENT Right 10/25/2013   Procedure: INSERTION PORT-A-CATH;  Surgeon: Cough POUR. Tsuei, MD;  Location: [facility];  Service: General;  Laterality: Right;   REDUCTION MAMMAPLASTY Right    REMOVAL OF TISSUE EXPANDER AND PLACEMENT OF IMPLANT Right 03/30/2024   Procedure: REMOVAL RIGHT BREAST TISSUE EXPANDER AND INSERTION OF IMPLANT;  Surgeon: Ranks Earlis, MD;  Location: [facility];  Service: Plastics;  Laterality: Right;   REVISION, RECONSTRUCTION, BREAST Left 03/30/2024   Procedure: REVISION LEFT BREAST RECONSTRUCTION;  Surgeon: [name], MD;  Location:  [facility];  Service: Plastics;  Laterality: Left;   SIMPLE MASTECTOMY WITH AXILLARY SENTINEL NODE BIOPSY Left 04/12/2014   Procedure: MASTECTOMY;  Surgeon: Donnice POUR. Belinda, MD;  [facility];  Service: General;  Laterality: Left;   TISSUE EXPANDER PLACEMENT Left 04/12/2014   Procedure: TISSUE EXPANDER;  Surgeon: Filippo Arelia, MD;  Location: [facility];  Service: Plastics;  Laterality: Left;    TISSUE EXPANDER PLACEMENT Left 05/22/2014   Procedure: PLACEMENT OF TISSUE EXPANDER/I&D WASHOUT SEROMA;  Surgeon: Filippo Arelia, MD;  Location[facility];  Service: Plastics;  Laterality: Left;    FAMILY HISTORY Family History  Problem Relation Age of Onset   Breast cancer Mother 67   Heart disease Father    Heart attack Father    Breast cancer Maternal Grandmother 25   Brain cancer Maternal Uncle 74       benign brain tumor   Heart attack Paternal Aunt     GYNECOLOGIC HISTORY:   menarche age 2, first live birth age 55. The patient is GX P2. She went through the change of life approximately 2011.    SOCIAL HISTORY:  The patient worked previously as a engineer, civil (consulting). She has 2 children both of whom live in [place] -- Maude, works for the [employer], and Usaa, a occupational psychologist. There are no grandchildren. The patient married Donzell (both women are originally from [place]) in [place]  2011. [place] with 5 dogs, 40+ chickens and 4 horses. They're not church attenders    ADVANCED DIRECTIVES: in place   HEALTH MAINTENANCE: Social History   Tobacco Use   Smoking status: Never   Smokeless tobacco: Never  Vaping Use   Vaping status: Never Used  Substance Use Topics   Alcohol use: Yes    Comment: 2 drinks/day   Drug use: No     Colonoscopy:  PAP: May 2017    Bone density:  11/24/2017 showed a T score of  -1.6 osteopenia  Lipid panel:  Allergies  Allergen Reactions   Cyanoacrylate Rash    (Dermabond)   Adhesive [Tape] Rash    Band-aids    Current Outpatient Medications  Medication Sig Dispense Refill   cholecalciferol (VITAMIN D ) 25 MCG (1000 UT) tablet TAKE 1 TABLET BY MOUTH EVERY DAY 100 tablet 4   tamoxifen  (NOLVADEX ) 20 MG tablet Take 1 tablet (20 mg total) by mouth daily. 90 tablet 3   telmisartan-hydrochlorothiazide  (MICARDIS HCT) 80-12.5 MG tablet Take 1 tablet by mouth daily.     Vilazodone HCl (VIIBRYD) 10 MG TABS Take 10 mg by  mouth daily.     No current facility-administered medications for this visit.   Facility-Administered Medications Ordered in Other Visits  Medication Dose Route Frequency Provider Last Rate Last Admin   Chlorhexidine  Gluconate Cloth 2 % PADS 6 each  6 each Topical Once Thimmappa, Brinda, MD       And   Chlorhexidine  Gluconate Cloth 2 % PADS 6 each  6 each Topical Once Thimmappa, Brinda, MD        OBJECTIVE: White woman who appears younger than stated age  Vitals:   07/09/24 0924  BP: (!) 140/62  Pulse: (!) 59  Resp: 16  Temp: (!) 97.4 F (36.3 C)  SpO2: 99%        Body mass index is 31.28 kg/m.  Filed Weights   07/09/24 0924  Weight: 193 lb 12.8 oz (87.9 kg)      ECOG FS:1 - Symptomatic but completely ambulatory   Physical Exam Constitutional:      Appearance: Normal appearance.  Chest:     Comments: She is status post bilateral mastectomy, wound completely healed. No palpable masses or regional adenopathy. Musculoskeletal:        General: No swelling or tenderness. Normal range of motion.     Cervical back: Normal range of motion and neck supple. No rigidity.  Lymphadenopathy:     Cervical: No cervical adenopathy.  Skin:    General: Skin is warm and dry.  Neurological:     General: No focal deficit present.     Mental Status: She is alert.      LAB RESULTS:  CMP     Component Value Date/Time   NA 137 07/06/2024 1014   NA 140 02/16/2017 1323   K 4.1 07/06/2024 1014   K 3.7 02/16/2017 1323   CL 104 07/06/2024 1014   CO2 22 07/06/2024 1014   CO2 26 02/16/2017 1323   GLUCOSE 108 (H) 07/06/2024 1014   GLUCOSE 113 02/16/2017 1323   BUN 9 07/06/2024 1014   BUN 12.8 02/16/2017 1323   CREATININE 0.64 07/06/2024 1014   CREATININE 0.76 01/05/2024 0943   CREATININE 0.8 02/16/2017 1323   CALCIUM 8.9 07/06/2024 1014   CALCIUM 9.7 02/16/2017 1323   PROT 7.0 01/05/2024 0943   PROT 6.9 02/16/2017 1323   ALBUMIN 4.4 01/05/2024 0943   ALBUMIN 4.1  02/16/2017 1323   AST 25 01/05/2024 0943   AST 34 02/16/2017 1323   ALT 44 01/05/2024 0943   ALT 58 (H) 02/16/2017 1323   ALKPHOS 68 01/05/2024 0943   ALKPHOS 107 02/16/2017 1323   BILITOT 0.4 01/05/2024 0943   BILITOT 0.28 02/16/2017 1323   GFRNONAA >60 07/06/2024 1014   GFRNONAA >60 01/05/2024 0943   GFRAA >60 05/01/2020 1353    I No results found for: SPEP, UPEP  Lab Results  Component Value Date   WBC 6.2 01/05/2024   NEUTROABS 3.9 01/05/2024   HGB 13.6 01/05/2024   HCT 40.1 01/05/2024   MCV 87.4 01/05/2024   PLT 310 01/05/2024      Chemistry      Component Value Date/Time   NA 137 07/06/2024 1014   NA 140 02/16/2017 1323   K 4.1 07/06/2024 1014   K 3.7 02/16/2017 1323   CL 104 07/06/2024 1014   CO2 22 07/06/2024 1014   CO2 26 02/16/2017 1323   BUN 9 07/06/2024 1014   BUN 12.8 02/16/2017 1323   CREATININE 0.64 07/06/2024 1014   CREATININE 0.76 01/05/2024 0943   CREATININE 0.8 02/16/2017 1323      Component Value Date/Time   CALCIUM 8.9 07/06/2024 1014   CALCIUM 9.7 02/16/2017 1323   ALKPHOS 68 01/05/2024 0943   ALKPHOS 107 02/16/2017 1323   AST 25 01/05/2024 0943   AST 34 02/16/2017 1323   ALT 44 01/05/2024 0943   ALT 58 (H) 02/16/2017 1323   BILITOT 0.4 01/05/2024 0943   BILITOT 0.28 02/16/2017 1323       No results found for: LABCA2  No components found for: LABCA125  No results for input(s): INR in the last 168 hours.  Urinalysis    Component Value Date/Time   COLORURINE YELLOW 04/05/2016 2040   APPEARANCEUR CLEAR 04/05/2016 2040   LABSPEC 1.027 04/05/2016 2040   LABSPEC 1.005 05/22/2014  1130   PHURINE 7.0 04/05/2016 2040   GLUCOSEU NEGATIVE 04/05/2016 2040   GLUCOSEU Negative 05/22/2014 1130   HGBUR NEGATIVE 04/05/2016 2040   BILIRUBINUR NEGATIVE 04/05/2016 2040   BILIRUBINUR Negative 05/22/2014 1130   KETONESUR NEGATIVE 04/05/2016 2040   PROTEINUR 100 (A) 04/05/2016 2040   UROBILINOGEN 0.2 05/22/2014 1130   NITRITE  NEGATIVE 04/05/2016 2040   LEUKOCYTESUR NEGATIVE 04/05/2016 2040   LEUKOCYTESUR Negative 05/22/2014 1130    STUDIES: No results found.   ASSESSMENT: 63 y.o. BRCA negative [place] woman, [place] speaker   (1) status post left lumpectomy and axillary lymph node dissection in 1998 in [place] for a stage II invasive ductal carcinoma, grade 2, treated with adjuvant chemotherapy (specific drugs not clear) and adjuvant radiation.  (2) status post left breast upper outer quadrant biopsy 10/04/2013 for a clinically mT2 N0, stage IIA invasive ductal carcinoma, grade 2, estrogen receptor 100% positive, progesterone receptor 33% positive, with an MIB-1 of 15%, and HER-2 amplified; staging studies showed no evidence of metastatic disease  (3) started on neoadjuvant carboplatin / docetaxel / trastuzumab / pertuzumab  11/12/2013  (a) completed 4 cycles as of 05/06/201, after which docetaxel  was discontinued because of neuropathy symptoms   (b) gemcitabine  was given day 1 and day 8 with carboplatin  day 1 in the final 2 cycles, completed 03/04/2014   (c)  completed a year of trastuzumab  March 2016--  Final echo 10/21/2014 shows a normal EF  (4) left mastectomy  04/12/2014 showed residual mypT1c ypNX invasive ductal carcinoma, grade 2, estrogen and progesterone receptor positive, HER-2 negative, with negative margins  (5) anastrozole  started 07/14/2014, completing five years November 2020  (a) bone density 07/29/2014 shows T score - 1.2 (mild osteopenia)  (b) bone density 11/24/2017 showed a T score of  -1.6 osteopenia  (6) 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  (7) June 2024, mammogram and ultrasound recently showed a hypoechoic mass at the 7 o'clock position 9 cm from the nipple measuring 8 x 7 x 8 mm and another slightly irregular hypoechoic mass at the 7 o'clock position 10 cm from the nipple measuring 4 x 4 x 5 mm.  Pathology showed low-grade IDC at  the 7 o'clock position 9 cm from the nipple and DCIS at the 7 o'clock position 10 cm from the nipple.  She went through right mastectomy and is here to review the Oncotype results and further recommendations.  Oncotype DX result 7, distant risk recurrence at 9 years of 3%, no definitive chemotherapy benefit.  PLAN:  Assessment and Plan Assessment & Plan Breast cancer Current exemestane  therapy causes headaches and joint pain. Tamoxifen  proposed as alternative due to different mechanism, potentially reducing side effects and improving bone density. Discussed risks of blood clots and endometrial cancer. - Prescribe tamoxifen  20 mg once daily with a three-month supply and three refills. - Discuss potential side effects of tamoxifen , including a small risk of blood clots (1 in 4000) and endometrial cancer (1 in 5000). - Explain that tamoxifen  may improve bone density. - Advise to report any breakthrough bleeding immediately.  Lymphedema Lymphedema secondary to axillary nodal dissection. Interested in [facility] options at [facility]. - Research and identify a specialist at [facility] for lymphedema surgery consultation. - Plan to schedule consultation after return from [place] in August.  Depression Depression managed with antidepressants. Recent emotional distress noted. Stopped bupropion.  - Recommend discussing with psychiatrist the possibility of switching to Lexapro , which does not interact with tamoxifen .  Possible Hashimoto's thyroiditis  Possible Hashimoto's due to family history and thyroid  enlargement. Diagnosis requires blood work. - Order thyroid  function tests to evaluate for Hashimoto's thyroiditis.   Goals of Care Plans to travel to [place] for two months. Interested in completing evaluations and treatments within the year. - Plan follow-up appointment in six months. - Schedule bone density test after return from [place]. - Coordinate lymphedema surgery consultation at [facility] for  August.    Follow-up in 6 months or post-surgical correction.  Total time spent: 40 min *Total Encounter Time as defined by the Centers for Medicare and Medicaid Services includes, in addition to the face-to-face time of a patient visit (documented in the note above) non-face-to-face time: obtaining and reviewing outside history, ordering and reviewing medications, tests or procedures, care coordination (communications with other health care professionals or caregivers) and documentation in the medical record.

## 2024-07-09 NOTE — Progress Notes (Signed)
 [name]  [phone] [phone]     ID: Kelsey Mclaughlin OB: Nov 22, 1960  MR#: [id number]  RDW#:[id number]  Patient Care Team: Kelsey, Vyvyan, Mclaughlin as PCP - General (Family Medicine) Kelsey Aleene HERO, Mclaughlin as Referring Physician (Family Medicine) Kelsey Filippo, Mclaughlin as Consulting Physician (Plastic Surgery) Kelsey Victory DEL, Mclaughlin as Referring Physician (Obstetrics and Gynecology) Kelsey Ash, Mclaughlin as Consulting Physician (Hematology and Oncology) OTHER Mclaughlin: Kelsey Grippe Mclaughlin, Kelsey Aurora Mclaughlin  CHIEF COMPLAINT: Breast cancer, recurrent, estrogen receptor positive  CURRENT TREATMENT: observation  INTERVAL HISTORY:  Discussed the use of AI scribe software for clinical note transcription with the patient, who gave verbal consent to proceed.  History of Present Illness         The patient, with a history of bilateral mastectomy and lymphedema, presents for a follow-up on exemestane .   Discussed the use of AI scribe software for clinical note transcription with the patient, who gave verbal consent to proceed.  History of Present Illness  Kelsey Mclaughlin is a 63 year old female with a history of cancer who presents for follow-up regarding her current treatment and upcoming surgeries.  She is currently on tamoxifen , taken once daily, and is tolerating it well without any noticeable side effects. She feels better on this medication compared to previous treatments.  She is scheduled for a minor surgery this coming Friday with Dr. Fortunato. Additionally, she has another surgery scheduled for November 28 at [facility], for the lymphedema  There have been no recent health changes since the last visit. She reports that her headaches and joint pains are better, and she does not currently notice any swelling in her legs.  She has not seen her primary physician for a long time since her cancer recurrence. She needs to schedule a pre-surgery  check-up within 30 days of her surgery at [facility], but there is a long wait for appointments with her primary physician.   Rest of the pertinent 10 point ROS reviewed and negative   COVID 19 VACCINATION STATUS: fully vaccinated +1 booster   BREAST CANCER HISTORY: From doctor Kelsey Mclaughlin's intake note 01/30/2014:  Kelsey Mclaughlin is a 63 y.o. female. In 1998 at the age of 41 was diagnosed with left breast cancer in [place]. This was an invasive carcinoma grade 2 stage II. At that time she underwent a lumpectomy with excellent lymph node dissection. She received 6 months of chemotherapy. [name] She also had radiation therapy to the left breast. 2011 patient noticed a lump in the outside of her left breast. She had ultrasound workup performed and was told it was negative no biopsies were performed. General 2014 after [place] moved to the [place]  she had a mammogram performed this revealed a possible mass in the outer quadrant of the left breast. Ultrasound showed it to be 1.7 cm. MRI of the breasts performed on January 30 revealed the mass to be 1.5 x 2.2 x 1.5 cm. Because of this she also had a biopsy performed on 10/04/2013. The biopsy revealed [[id number]) an invasive ductal carcinoma with ductal carcinoma in situ grade 2. Prognostic panel was positive for estrogen receptor 100% megestrol receptor +33% proliferation marker Ki-67 15% and the tumor was HER-2/neu positive [with a signals ratio of 3.1 and number per cell 6.3]  The patient's subsequent history is as detailed below   PAST MEDICAL HISTORY: Past Medical  History:  Diagnosis Date   Bronchitis    completed antibiotics 10/21/13/ states resolved   Cancer (HCC) 1998, 2015   Left Breast   Chronic diastolic (congestive) heart failure (HCC)    Fatty liver    Hypertension    Lymph edema    lt arm   Stroke (HCC) 2004   TIA-  no problems since    PAST  SURGICAL HISTORY: Past Surgical History:  Procedure Laterality Date   ABDOMINOPLASTY/PANNICULECTOMY     BREAST BIOPSY Right 02/16/2023   US  RT BREAST BX W LOC DEV EA ADD LESION IMG BX SPEC US  GUIDE 02/16/2023 [facility]   BREAST BIOPSY Right 02/16/2023   US  RT BREAST BX W LOC DEV 1ST LESION IMG BX SPEC US  GUIDE 02/16/2023 [facility]   BREAST IMPLANT EXCHANGE Left 09/03/2014   Procedure: LEFT REMOVAL TISSUE EXPANDERS WITH PLACEMENT OF SILICONE IMPLANT ;  Surgeon: Kelsey Mclaughlin;  Location: [facility];  Service: Plastics;  Laterality: Left;   BREAST IMPLANT EXCHANGE Left 03/30/2024   Procedure: EXCHANGE LEFT BREAST IMPLANT;  Surgeon: Mclaughlin Earlis, Mclaughlin;  Location: [facility];  Service: Plastics;  Laterality: Left;   BREAST IMPLANT REMOVAL Right 08/09/2023   Procedure: REMOVAL RIGHT CHEST TISSUE EXPANDER;  Surgeon: Mclaughlin Earlis, Mclaughlin;  Location: [facility];  Service: Plastics;  Laterality: Right;   BREAST RECONSTRUCTION Left 03/07/2015   Procedure: LEFT NIPPLE AEROLA CREATION WITH LOCAL FLAP/FULL THICKNESS SKIN GRAFT FROM GROIN;  Surgeon: Kelsey Mclaughlin;  Location: [facility];  Service: Plastics;  Laterality: Left;   BREAST RECONSTRUCTION WITH PLACEMENT OF TISSUE EXPANDER AND ALLODERM Right 04/05/2023   Procedure: BREAST RECONSTRUCTION WITH PLACEMENT OF TISSUE EXPANDER;  Surgeon: Mclaughlin Earlis, Mclaughlin;  Location: [facility];  Service: Plastics;  Laterality: Right;   BREAST RECONSTRUCTION WITH PLACEMENT OF TISSUE EXPANDER AND ALLODERM Right 11/04/2023   Procedure: BREAST RECONSTRUCTION WITH PLACEMENT OF TISSUE EXPANDER AND ALLODERM;  Surgeon: Mclaughlin Earlis, Mclaughlin;  Location: [facility];  Service: Plastics;  Laterality: Right;   BREAST SURGERY  1998   lumpectomy on left and removed lymphnodes in [place]   BUNIONECTOMY Left 07/31/2020   Procedure: Left Lapidus and Modified Woodard Edwards;  Surgeon: Kelsey Rush, Mclaughlin;  Location: [facility];  Service: Orthopedics;  Laterality: Left;   DEBRIDEMENT AND CLOSURE WOUND Right 05/11/2023   Procedure: DEBRIDEMENT AND CLOSURE WOUND  RIGHT MASTECTOMY FLAP;  Surgeon: Mclaughlin Earlis, Mclaughlin;  Location: [facility];  Service: Plastics;  Laterality: Right;   HYDRADENITIS EXCISION Left 05/30/2018   Procedure: Adjacent tissue transfer left axilla;  Surgeon: Mclaughlin Earlis, Mclaughlin;  Location: [facility];  Service: Plastics;  Laterality: Left;   LATISSIMUS FLAP TO BREAST Left 04/12/2014   Procedure: LEFT LATISSIMUS DORSI FLAP FOR LEFT BREAST RECONSTRUCTION AND PLACEMENT OF TISSUE EXPANDER;  Surgeon: Kelsey Mclaughlin;  Location: [facility];  Service: Plastics;  Laterality: Left;   LIPOSUCTION WITH LIPOFILLING Left 09/03/2014   Procedure:  LIPOFILLING TO LEFT CHEST  ;  Surgeon: Kelsey Mclaughlin;  Location: [facility];  Service: Plastics;  Laterality: Left;   LIPOSUCTION WITH LIPOFILLING Left 03/07/2015   Procedure: LIPOFILLING TO LEFT BREAST;  Surgeon: Kelsey Mclaughlin;  Location:  SURGERY CENTER;  Service: Plastics;  Laterality: Left;   MASTECTOMY Left 2015   MASTECTOMY W/ SENTINEL NODE BIOPSY Right 04/05/2023   Procedure: RIGHT MASTECTOMY WITH SENTINEL LYMPH NODE BIOPSY;  Surgeon: Belinda,  Donnice, Mclaughlin;  Location: [facility];  Service: General;  Laterality: Right;  PEC BLOCK   MASTOPEXY Right 09/03/2014   Procedure: RIGHT BREAST MASTOPEXY ;  Surgeon: Kelsey Mclaughlin;  Location: [facility];  Service: Plastics;  Laterality: Right;   PORT-A-CATH REMOVAL Right 12/24/2014   Procedure: REMOVAL PORT-A-CATH;  Surgeon: Donnice Lima, Mclaughlin;  Location: [facility];  Service: General;  Laterality: Right;   PORTACATH PLACEMENT Right 10/25/2013   Procedure: INSERTION PORT-A-CATH;  Surgeon: Donnice POUR. Tsuei, Mclaughlin;  Location: [facility];  Service:  General;  Laterality: Right;   REDUCTION MAMMAPLASTY Right    REMOVAL OF TISSUE EXPANDER AND PLACEMENT OF IMPLANT Right 03/30/2024   Procedure: REMOVAL RIGHT BREAST TISSUE EXPANDER AND INSERTION OF IMPLANT;  Surgeon: Mclaughlin Earlis, Mclaughlin;  Location: [facility];  Service: Plastics;  Laterality: Right;   REVISION, RECONSTRUCTION, BREAST Left 03/30/2024   Procedure: REVISION LEFT BREAST RECONSTRUCTION;  Surgeon: Mclaughlin Earlis, Mclaughlin;  Location: [facility];  Service: Plastics;  Laterality: Left;   SIMPLE MASTECTOMY WITH AXILLARY SENTINEL NODE BIOPSY Left 04/12/2014   Procedure: MASTECTOMY;  Surgeon: Donnice POUR. Lima, Mclaughlin;  Location[facility];  Service: General;  Laterality: Left;   TISSUE EXPANDER PLACEMENT Left 04/12/2014   Procedure: TISSUE EXPANDER;  Surgeon: Kelsey Mclaughlin;  Location[facility];  Service: Plastics;  Laterality: Left;   TISSUE EXPANDER PLACEMENT Left 05/22/2014   Procedure: PLACEMENT OF TISSUE EXPANDER/I&D WASHOUT SEROMA;  Surgeon: Kelsey Mclaughlin;  Location[facility];  Service: Plastics;  Laterality: Left;    FAMILY HISTORY Family History  Problem Relation Age of Onset   Breast cancer Mother 71   Heart disease Father    Heart attack Father    Breast cancer Maternal Grandmother 30   Brain cancer Maternal Uncle 74       benign brain tumor   Heart attack Paternal Aunt     GYNECOLOGIC HISTORY:   menarche age 38, first live birth age 39. The patient is GX P2. She went through the change of life approximately 2011.    SOCIAL HISTORY:  The patient worked previously as a engineer, civil (consulting). She has 2 children both of whom live in [place] -- Maude, works for the [employer], and Usaa, a occupational psychologist. There are no grandchildren. The patient married Donzell (both women are originally from [place]) in [place]  2011. [place] with 5 dogs, 40+ chickens and 4 horses. They're not church attenders    ADVANCED DIRECTIVES: in  place   HEALTH MAINTENANCE: Social History   Tobacco Use   Smoking status: Never   Smokeless tobacco: Never  Vaping Use   Vaping status: Never Used  Substance Use Topics   Alcohol use: Yes    Comment: 2 drinks/day   Drug use: No     Colonoscopy:  PAP: May 2017    Bone density:  11/24/2017 showed a T score of  -1.6 osteopenia  Lipid panel:  Allergies  Allergen Reactions   Cyanoacrylate Rash    (Dermabond)   Adhesive [Tape] Rash    Band-aids    Current Outpatient Medications  Medication Sig Dispense Refill   cholecalciferol (VITAMIN D ) 25 MCG (1000 UT) tablet TAKE 1 TABLET BY MOUTH EVERY DAY 100 tablet 4   tamoxifen  (NOLVADEX ) 20 MG tablet Take 1 tablet (20 mg total) by mouth daily. 90 tablet 3   telmisartan-hydrochlorothiazide  (MICARDIS HCT) 80-12.5 MG tablet Take 1 tablet by mouth daily.  Vilazodone HCl (VIIBRYD) 10 MG TABS Take 10 mg by mouth daily.     No current facility-administered medications for this visit.   Facility-Administered Medications Ordered in Other Visits  Medication Dose Route Frequency Provider Last Rate Last Admin   Chlorhexidine  Gluconate Cloth 2 % PADS 6 each  6 each Topical Once Thimmappa, Brinda, Mclaughlin       And   Chlorhexidine  Gluconate Cloth 2 % PADS 6 each  6 each Topical Once Thimmappa, Brinda, Mclaughlin        OBJECTIVE: White woman who appears younger than stated age  There were no vitals filed for this visit.       There is no height or weight on file to calculate BMI.    There were no vitals filed for this visit.     ECOG FS:1 - Symptomatic but completely ambulatory   Physical Exam Constitutional:      Appearance: Normal appearance.  Chest:     Comments: She is status post bilateral mastectomy, no palpable masses. No regional adenopathy Musculoskeletal:        General: No swelling or tenderness. Normal range of motion.     Cervical back: Normal range of motion and neck supple. No rigidity.  Lymphadenopathy:     Cervical:  No cervical adenopathy.  Skin:    General: Skin is warm and dry.  Neurological:     General: No focal deficit present.     Mental Status: She is alert.      LAB RESULTS:  CMP     Component Value Date/Time   NA 137 07/06/2024 1014   NA 140 02/16/2017 1323   K 4.1 07/06/2024 1014   K 3.7 02/16/2017 1323   CL 104 07/06/2024 1014   CO2 22 07/06/2024 1014   CO2 26 02/16/2017 1323   GLUCOSE 108 (H) 07/06/2024 1014   GLUCOSE 113 02/16/2017 1323   BUN 9 07/06/2024 1014   BUN 12.8 02/16/2017 1323   CREATININE 0.64 07/06/2024 1014   CREATININE 0.76 01/05/2024 0943   CREATININE 0.8 02/16/2017 1323   CALCIUM 8.9 07/06/2024 1014   CALCIUM 9.7 02/16/2017 1323   PROT 7.0 01/05/2024 0943   PROT 6.9 02/16/2017 1323   ALBUMIN 4.4 01/05/2024 0943   ALBUMIN 4.1 02/16/2017 1323   AST 25 01/05/2024 0943   AST 34 02/16/2017 1323   ALT 44 01/05/2024 0943   ALT 58 (H) 02/16/2017 1323   ALKPHOS 68 01/05/2024 0943   ALKPHOS 107 02/16/2017 1323   BILITOT 0.4 01/05/2024 0943   BILITOT 0.28 02/16/2017 1323   GFRNONAA >60 07/06/2024 1014   GFRNONAA >60 01/05/2024 0943   GFRAA >60 05/01/2020 1353    I No results found for: SPEP, UPEP  Lab Results  Component Value Date   WBC 6.2 01/05/2024   NEUTROABS 3.9 01/05/2024   HGB 13.6 01/05/2024   HCT 40.1 01/05/2024   MCV 87.4 01/05/2024   PLT 310 01/05/2024      Chemistry      Component Value Date/Time   NA 137 07/06/2024 1014   NA 140 02/16/2017 1323   K 4.1 07/06/2024 1014   K 3.7 02/16/2017 1323   CL 104 07/06/2024 1014   CO2 22 07/06/2024 1014   CO2 26 02/16/2017 1323   BUN 9 07/06/2024 1014   BUN 12.8 02/16/2017 1323   CREATININE 0.64 07/06/2024 1014   CREATININE 0.76 01/05/2024 0943   CREATININE 0.8 02/16/2017 1323      Component Value Date/Time  CALCIUM 8.9 07/06/2024 1014   CALCIUM 9.7 02/16/2017 1323   ALKPHOS 68 01/05/2024 0943   ALKPHOS 107 02/16/2017 1323   AST 25 01/05/2024 0943   AST 34 02/16/2017 1323    ALT 44 01/05/2024 0943   ALT 58 (H) 02/16/2017 1323   BILITOT 0.4 01/05/2024 0943   BILITOT 0.28 02/16/2017 1323       No results found for: LABCA2  No components found for: LABCA125  No results for input(s): INR in the last 168 hours.  Urinalysis    Component Value Date/Time   COLORURINE YELLOW 04/05/2016 2040   APPEARANCEUR CLEAR 04/05/2016 2040   LABSPEC 1.027 04/05/2016 2040   LABSPEC 1.005 05/22/2014 1130   PHURINE 7.0 04/05/2016 2040   GLUCOSEU NEGATIVE 04/05/2016 2040   GLUCOSEU Negative 05/22/2014 1130   HGBUR NEGATIVE 04/05/2016 2040   BILIRUBINUR NEGATIVE 04/05/2016 2040   BILIRUBINUR Negative 05/22/2014 1130   KETONESUR NEGATIVE 04/05/2016 2040   PROTEINUR 100 (A) 04/05/2016 2040   UROBILINOGEN 0.2 05/22/2014 1130   NITRITE NEGATIVE 04/05/2016 2040   LEUKOCYTESUR NEGATIVE 04/05/2016 2040   LEUKOCYTESUR Negative 05/22/2014 1130    STUDIES: No results found.   ASSESSMENT: 63 y.o. BRCA negative [place] woman, German speaker   (1) status post left lumpectomy and axillary lymph node dissection in 1998 in [place] for a stage II invasive ductal carcinoma, grade 2, treated with adjuvant chemotherapy (specific drugs not clear) and adjuvant radiation.  (2) status post left breast upper outer quadrant biopsy 10/04/2013 for a clinically mT2 N0, stage IIA invasive ductal carcinoma, grade 2, estrogen receptor 100% positive, progesterone receptor 33% positive, with an MIB-1 of 15%, and HER-2 amplified; staging studies showed no evidence of metastatic disease  (3) started on neoadjuvant carboplatin / docetaxel / trastuzumab / pertuzumab  11/12/2013  (a) completed 4 cycles as of 05/06/201, after which docetaxel  was discontinued because of neuropathy symptoms   (b) gemcitabine  was given day 1 and day 8 with carboplatin  day 1 in the final 2 cycles, completed 03/04/2014   (c)  completed a year of trastuzumab  March 2016--  Final echo 10/21/2014 shows a normal  EF  (4) left mastectomy  04/12/2014 showed residual mypT1c ypNX invasive ductal carcinoma, grade 2, estrogen and progesterone receptor positive, HER-2 negative, with negative margins  (5) anastrozole  started 07/14/2014, completing five years November 2020  (a) bone density 07/29/2014 shows T score - 1.2 (mild osteopenia)  (b) bone density 11/24/2017 showed a T score of  -1.6 osteopenia  (6) 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  (7) June 2024, mammogram and ultrasound recently showed a hypoechoic mass at the 7 o'clock position 9 cm from the nipple measuring 8 x 7 x 8 mm and another slightly irregular hypoechoic mass at the 7 o'clock position 10 cm from the nipple measuring 4 x 4 x 5 mm.  Pathology showed low-grade IDC at the 7 o'clock position 9 cm from the nipple and DCIS at the 7 o'clock position 10 cm from the nipple.  She went through right mastectomy and is here to review the Oncotype results and further recommendations.  Oncotype DX result 7, distant risk recurrence at 9 years of 3%, no definitive chemotherapy benefit.  PLAN: Assessment & Plan Breast cancer Managed with tamoxifen , well-tolerated, improved well-being. Scheduled for minor surgery with Dr. Fortunato and reconstructive surgery at [facility]. - Continue tamoxifen  once daily. - No cocnern for recurrence. - Proceed with scheduled surgery with Dr. Fortunato. - Proceed with scheduled surgery  at [facility] on November 28. - Ensure pre-surgical lab work is completed as required by [employer].  Lymphedema of upper limb Scheduled for surgery at [facility]   Follow-up in 6 months or post-surgical correction.  Total time spent: 30 min *Total Encounter Time as defined by the Centers for Medicare and Medicaid Services includes, in addition to the face-to-face time of a patient visit (documented in the note above) non-face-to-face time: obtaining and reviewing outside history,  ordering and reviewing medications, tests or procedures, care coordination (communications with other health care professionals or caregivers) and documentation in the medical record.

## 2024-07-13 ENCOUNTER — Encounter (HOSPITAL_BASED_OUTPATIENT_CLINIC_OR_DEPARTMENT_OTHER): Admission: RE | Disposition: A | Payer: Self-pay | Source: Home / Self Care | Attending: Plastic Surgery

## 2024-07-13 ENCOUNTER — Ambulatory Visit (HOSPITAL_BASED_OUTPATIENT_CLINIC_OR_DEPARTMENT_OTHER)
Admission: RE | Admit: 2024-07-13 | Discharge: 2024-07-13 | Disposition: A | Discharge status: Home / Self Care | Attending: Plastic Surgery | Admitting: Plastic Surgery

## 2024-07-13 ENCOUNTER — Ambulatory Visit (HOSPITAL_BASED_OUTPATIENT_CLINIC_OR_DEPARTMENT_OTHER): Admitting: Anesthesiology

## 2024-07-13 ENCOUNTER — Other Ambulatory Visit: Payer: Self-pay

## 2024-07-13 ENCOUNTER — Encounter (HOSPITAL_BASED_OUTPATIENT_CLINIC_OR_DEPARTMENT_OTHER): Payer: Self-pay | Admitting: Plastic Surgery

## 2024-07-13 DIAGNOSIS — Z923 Personal history of irradiation: Secondary | ICD-10-CM | POA: Diagnosis not present

## 2024-07-13 DIAGNOSIS — Z853 Personal history of malignant neoplasm of breast: Secondary | ICD-10-CM | POA: Insufficient documentation

## 2024-07-13 DIAGNOSIS — Z01818 Encounter for other preprocedural examination: Secondary | ICD-10-CM

## 2024-07-13 DIAGNOSIS — I10 Essential (primary) hypertension: Secondary | ICD-10-CM | POA: Diagnosis not present

## 2024-07-13 DIAGNOSIS — Z9221 Personal history of antineoplastic chemotherapy: Secondary | ICD-10-CM | POA: Insufficient documentation

## 2024-07-13 DIAGNOSIS — Z9013 Acquired absence of bilateral breasts and nipples: Secondary | ICD-10-CM | POA: Diagnosis not present

## 2024-07-13 DIAGNOSIS — Z421 Encounter for breast reconstruction following mastectomy: Secondary | ICD-10-CM | POA: Diagnosis not present

## 2024-07-13 HISTORY — PX: SKIN FULL THICKNESS GRAFT: SHX442

## 2024-07-13 HISTORY — PX: BREAST RECONSTRUCTION: SHX9

## 2024-07-13 SURGERY — RECONSTRUCTION, NIPPLE
Anesthesia: General | Site: Groin | Laterality: Right

## 2024-07-13 MED ORDER — FENTANYL CITRATE (PF) 100 MCG/2ML IJ SOLN
INTRAMUSCULAR | Status: AC
  Filled 2024-07-13: qty 2

## 2024-07-13 MED ORDER — ONDANSETRON HCL 4 MG/2ML IJ SOLN
INTRAMUSCULAR | Status: DC | PRN
  Administered 2024-07-13: 4 mg via INTRAVENOUS

## 2024-07-13 MED ORDER — BUPIVACAINE-EPINEPHRINE (PF) 0.25% -1:200000 IJ SOLN
INTRAMUSCULAR | Status: AC
  Filled 2024-07-13: qty 30

## 2024-07-13 MED ORDER — EPHEDRINE 5 MG/ML INJ
INTRAVENOUS | Status: AC
  Filled 2024-07-13: qty 5

## 2024-07-13 MED ORDER — PROPOFOL 10 MG/ML IV BOLUS
INTRAVENOUS | Status: DC | PRN
  Administered 2024-07-13: 180 mg via INTRAVENOUS

## 2024-07-13 MED ORDER — MIDAZOLAM HCL 2 MG/2ML IJ SOLN
INTRAMUSCULAR | Status: AC
  Filled 2024-07-13: qty 2

## 2024-07-13 MED ORDER — CEFAZOLIN SODIUM-DEXTROSE 2-4 GM/100ML-% IV SOLN
INTRAVENOUS | Status: AC
  Filled 2024-07-13: qty 100

## 2024-07-13 MED ORDER — MIDAZOLAM HCL 5 MG/5ML IJ SOLN
INTRAMUSCULAR | Status: DC | PRN
  Administered 2024-07-13: 2 mg via INTRAVENOUS

## 2024-07-13 MED ORDER — LIDOCAINE-EPINEPHRINE 1 %-1:100000 IJ SOLN
INTRAMUSCULAR | Status: AC
  Filled 2024-07-13: qty 1

## 2024-07-13 MED ORDER — PHENYLEPHRINE HCL (PRESSORS) 10 MG/ML IV SOLN
INTRAVENOUS | Status: DC | PRN
Start: 2024-07-13 — End: 2024-07-13
  Administered 2024-07-13 (×2): 80 ug via INTRAVENOUS

## 2024-07-13 MED ORDER — OXYCODONE HCL 5 MG PO TABS: 5.0000 mg | ORAL_TABLET | Freq: Once | ORAL | Status: DC | PRN

## 2024-07-13 MED ORDER — GABAPENTIN 300 MG PO CAPS
300.0000 mg | ORAL_CAPSULE | ORAL | Status: AC
  Administered 2024-07-13: 300 mg via ORAL

## 2024-07-13 MED ORDER — ACETAMINOPHEN 500 MG PO TABS
1000.0000 mg | ORAL_TABLET | ORAL | Status: AC
  Administered 2024-07-13: 1000 mg via ORAL

## 2024-07-13 MED ORDER — CELECOXIB 200 MG PO CAPS
200.0000 mg | ORAL_CAPSULE | ORAL | Status: AC
  Administered 2024-07-13: 200 mg via ORAL

## 2024-07-13 MED ORDER — CEFAZOLIN SODIUM-DEXTROSE 2-4 GM/100ML-% IV SOLN
2.0000 g | INTRAVENOUS | Status: AC
  Administered 2024-07-13: 2 g via INTRAVENOUS

## 2024-07-13 MED ORDER — ONDANSETRON HCL 4 MG/2ML IJ SOLN
INTRAMUSCULAR | Status: AC
Start: 2024-07-13 — End: 2024-07-13
  Filled 2024-07-13: qty 2

## 2024-07-13 MED ORDER — DROPERIDOL 2.5 MG/ML IJ SOLN: 0.6250 mg | Freq: Once | INTRAMUSCULAR | Status: DC | PRN

## 2024-07-13 MED ORDER — LIDOCAINE HCL (PF) 1 % IJ SOLN
INTRAMUSCULAR | Status: AC
  Filled 2024-07-13: qty 30

## 2024-07-13 MED ORDER — PHENYLEPHRINE 80 MCG/ML (10ML) SYRINGE FOR IV PUSH (FOR BLOOD PRESSURE SUPPORT)
PREFILLED_SYRINGE | INTRAVENOUS | Status: AC
  Filled 2024-07-13: qty 10

## 2024-07-13 MED ORDER — 0.9 % SODIUM CHLORIDE (POUR BTL) OPTIME
TOPICAL | Status: DC | PRN
  Administered 2024-07-13: 1000 mL

## 2024-07-13 MED ORDER — GABAPENTIN 300 MG PO CAPS
ORAL_CAPSULE | ORAL | Status: AC
  Filled 2024-07-13: qty 1

## 2024-07-13 MED ORDER — DEXAMETHASONE SODIUM PHOSPHATE 4 MG/ML IJ SOLN
INTRAMUSCULAR | Status: DC | PRN
  Administered 2024-07-13: 5 mg via INTRAVENOUS

## 2024-07-13 MED ORDER — LIDOCAINE 2% (20 MG/ML) 5 ML SYRINGE
INTRAMUSCULAR | Status: AC
Start: 2024-07-13 — End: 2024-07-13
  Filled 2024-07-13: qty 5

## 2024-07-13 MED ORDER — FENTANYL CITRATE (PF) 100 MCG/2ML IJ SOLN: 25.0000 ug | INTRAMUSCULAR | Status: DC | PRN

## 2024-07-13 MED ORDER — CELECOXIB 200 MG PO CAPS
ORAL_CAPSULE | ORAL | Status: AC
  Filled 2024-07-13: qty 1

## 2024-07-13 MED ORDER — PROPOFOL 500 MG/50ML IV EMUL
INTRAVENOUS | Status: AC
Start: 2024-07-13 — End: 2024-07-13
  Filled 2024-07-13: qty 50

## 2024-07-13 MED ORDER — BUPIVACAINE HCL (PF) 0.5 % IJ SOLN
INTRAMUSCULAR | Status: AC
Start: 2024-07-13 — End: 2024-07-13
  Filled 2024-07-13: qty 30

## 2024-07-13 MED ORDER — OXYCODONE HCL 5 MG/5ML PO SOLN: 5.0000 mg | Freq: Once | ORAL | Status: DC | PRN

## 2024-07-13 MED ORDER — FENTANYL CITRATE (PF) 100 MCG/2ML IJ SOLN
INTRAMUSCULAR | Status: DC | PRN
  Administered 2024-07-13 (×4): 25 ug via INTRAVENOUS
  Administered 2024-07-13: 50 ug via INTRAVENOUS
  Administered 2024-07-13 (×2): 25 ug via INTRAVENOUS

## 2024-07-13 MED ORDER — ACETAMINOPHEN 500 MG PO TABS
ORAL_TABLET | ORAL | Status: AC
  Filled 2024-07-13: qty 2

## 2024-07-13 MED ORDER — EPHEDRINE SULFATE (PRESSORS) 25 MG/5ML IV SOSY
PREFILLED_SYRINGE | INTRAVENOUS | Status: DC | PRN
  Administered 2024-07-13: 10 mg via INTRAVENOUS
  Administered 2024-07-13: 5 mg via INTRAVENOUS
  Administered 2024-07-13: 10 mg via INTRAVENOUS

## 2024-07-13 MED ORDER — LACTATED RINGERS IV SOLN: INTRAVENOUS | Status: DC

## 2024-07-13 MED ORDER — BUPIVACAINE-EPINEPHRINE 0.25% -1:200000 IJ SOLN
INTRAMUSCULAR | Status: DC | PRN
  Administered 2024-07-13: 20 mL

## 2024-07-13 MED ORDER — LIDOCAINE 2% (20 MG/ML) 5 ML SYRINGE
INTRAMUSCULAR | Status: DC | PRN
  Administered 2024-07-13: 100 mg via INTRAVENOUS

## 2024-07-13 MED ORDER — BACITRACIN 500 UNIT/GM EX OINT
TOPICAL_OINTMENT | CUTANEOUS | Status: DC | PRN
  Administered 2024-07-13: 1 via TOPICAL

## 2024-07-13 SURGICAL SUPPLY — 56 items
BENZOIN TINCTURE PRP APPL 2/3 (GAUZE/BANDAGES/DRESSINGS) IMPLANT
BLADE CLIPPER SURG (BLADE) IMPLANT
BLADE SURG 15 STRL LF DISP TIS (BLADE) ×2 IMPLANT
BRUSH SCRUB EZ PLAIN DRY (MISCELLANEOUS) IMPLANT
CHLORAPREP W/TINT 26 (MISCELLANEOUS) IMPLANT
CLSR STERI-STRIP ANTIMIC 1/2X4 (GAUZE/BANDAGES/DRESSINGS) IMPLANT
COTTONBALL LRG STERILE PKG (GAUZE/BANDAGES/DRESSINGS) IMPLANT
COVER BACK TABLE 60X90IN (DRAPES) ×2 IMPLANT
COVER MAYO STAND STRL (DRAPES) ×2 IMPLANT
DERMABOND ADVANCED .7 DNX12 (GAUZE/BANDAGES/DRESSINGS) IMPLANT
DRAPE LAPAROSCOPIC ABDOMINAL (DRAPES) IMPLANT
DRAPE TOP ARMCOVERS (MISCELLANEOUS) IMPLANT
DRAPE U-SHAPE 76X120 STRL (DRAPES) IMPLANT
DRAPE UTILITY XL STRL (DRAPES) ×2 IMPLANT
DRSG EMULSION OIL 3X3 NADH (GAUZE/BANDAGES/DRESSINGS) IMPLANT
ELECT COATED BLADE 2.86 ST (ELECTRODE) IMPLANT
ELECT NDL BLADE 2-5/6 (NEEDLE) ×2 IMPLANT
ELECT NEEDLE BLADE 2-5/6 (NEEDLE) ×2 IMPLANT
ELECTRODE REM PT RTRN 9FT ADLT (ELECTROSURGICAL) ×2 IMPLANT
GAUZE SPONGE 2X2 STRL 8-PLY (GAUZE/BANDAGES/DRESSINGS) IMPLANT
GAUZE XEROFORM 1X8 LF (GAUZE/BANDAGES/DRESSINGS) IMPLANT
GAUZE XEROFORM 5X9 LF (GAUZE/BANDAGES/DRESSINGS) IMPLANT
GLOVE BIO SURGEON STRL SZ 6 (GLOVE) ×2 IMPLANT
GLOVE BIOGEL PI IND STRL 6.5 (GLOVE) IMPLANT
GLOVE BIOGEL PI IND STRL 7.0 (GLOVE) IMPLANT
GOWN STRL REUS W/ TWL LRG LVL3 (GOWN DISPOSABLE) ×4 IMPLANT
NDL HYPO 25GX1X1/2 BEV (NEEDLE) IMPLANT
NDL PRECISIONGLIDE 27X1.5 (NEEDLE) IMPLANT
NEEDLE HYPO 25GX1X1/2 BEV (NEEDLE) ×2 IMPLANT
NEEDLE PRECISIONGLIDE 27X1.5 (NEEDLE) IMPLANT
PACK BASIN DAY SURGERY FS (CUSTOM PROCEDURE TRAY) ×2 IMPLANT
PENCIL SMOKE EVACUATOR (MISCELLANEOUS) ×2 IMPLANT
SHEET MEDIUM DRAPE 40X70 STRL (DRAPES) IMPLANT
SLEEVE SCD COMPRESS KNEE MED (STOCKING) IMPLANT
SPIKE FLUID TRANSFER (MISCELLANEOUS) IMPLANT
SPONGE T-LAP 18X18 ~~LOC~~+RFID (SPONGE) IMPLANT
STAPLER SKIN PROX 35W (STAPLE) IMPLANT
STAPLER SKIN PROX WIDE 3.9 (STAPLE) IMPLANT
STRIP CLOSURE SKIN 1/2X4 (GAUZE/BANDAGES/DRESSINGS) IMPLANT
STRIP CLOSURE SKIN 1/4X4 (GAUZE/BANDAGES/DRESSINGS) IMPLANT
SUT MNCRL AB 3-0 PS2 18 (SUTURE) IMPLANT
SUT PROLENE 5 0 P 3 (SUTURE) IMPLANT
SUT PROLENE 5 0 PS 2 (SUTURE) IMPLANT
SUT PROLENE 6 0 P 1 18 (SUTURE) IMPLANT
SUT SILK 4 0 PS 2 (SUTURE) IMPLANT
SUT VIC AB 3-0 PS2 18XBRD (SUTURE) IMPLANT
SUT VIC AB 4-0 P-3 18XBRD (SUTURE) IMPLANT
SUT VICRYL RAPIDE 4-0 (SUTURE) IMPLANT
SYR BULB EAR ULCER 3OZ GRN STR (SYRINGE) IMPLANT
SYR BULB IRRIG 60ML STRL (SYRINGE) IMPLANT
SYR CONTROL 10ML LL (SYRINGE) IMPLANT
TOWEL GREEN STERILE FF (TOWEL DISPOSABLE) ×2 IMPLANT
TRAY DSU PREP LF (CUSTOM PROCEDURE TRAY) IMPLANT
TUBE CONNECTING 20X1/4 (TUBING) IMPLANT
TUBING SUCTION 1/4X6FT (MISCELLANEOUS) IMPLANT
YANKAUER SUCT BULB TIP NO VENT (SUCTIONS) IMPLANT

## 2024-07-13 NOTE — Anesthesia Postprocedure Evaluation (Signed)
 Anesthesia Post Note  Patient: Kelsey Mclaughlin  Procedure(s) Performed: RIGHT NIPPLE AREOLA RECONSTRUCTION WITH LOCAL FLAP (Right: Breast) FULL-THICKNESS SKIN GRAFT FROM RIGHT GROIN (Right: Groin)     Patient location during evaluation: PACU Anesthesia Type: General Level of consciousness: awake and alert Pain management: pain level controlled Vital Signs Assessment: post-procedure vital signs reviewed and stable Respiratory status: spontaneous breathing, nonlabored ventilation and respiratory function stable Cardiovascular status: blood pressure returned to baseline Postop Assessment: no apparent nausea or vomiting Anesthetic complications: no   No notable events documented.  Last Vitals:  Vitals:   07/13/24 0930 07/13/24 0937  BP: 139/74 (!) 148/75  Pulse: 68 77  Resp: 11 14  Temp:    SpO2: 100% 97%    Last Pain:  Vitals:   07/13/24 0937  TempSrc:   PainSc: 0-No pain   Pain Goal:                   Vertell Row

## 2024-07-13 NOTE — Anesthesia Procedure Notes (Signed)
 Procedure Name: LMA Insertion Date/Time: 07/13/2024 7:39 AM  Performed by: Pam Macario BROCKS, CRNAPre-anesthesia Checklist: Patient identified, Emergency Drugs available, Suction available, Patient being monitored and Timeout performed Patient Re-evaluated:Patient Re-evaluated prior to induction Oxygen Delivery Method: Circle system utilized Preoxygenation: Pre-oxygenation with 100% oxygen Induction Type: IV induction Ventilation: Mask ventilation without difficulty LMA: LMA inserted LMA Size: 4.0 Number of attempts: 1 Airway Equipment and Method: Bite block Placement Confirmation: positive ETCO2, breath sounds checked- equal and bilateral and CO2 detector Tube secured with: Tape Dental Injury: Teeth and Oropharynx as per pre-operative assessment

## 2024-07-13 NOTE — Interval H&P Note (Signed)
 History and Physical Interval Note:  07/13/2024 6:47 AM  Kelsey Mclaughlin  has presented today for surgery, with the diagnosis of Personal history of breast cancer.  The various methods of treatment have been discussed with the patient and family. After consideration of risks, benefits and other options for treatment, the patient has consented to  Procedure(s): RECONSTRUCTION, NIPPLE (Right) as a surgical intervention.  The patient's history has been reviewed, patient examined, no change in status, stable for surgery.  I have reviewed the patient's chart and labs.  Questions were answered to the patient's satisfaction.     Kelsey Mclaughlin

## 2024-07-13 NOTE — Anesthesia Preprocedure Evaluation (Addendum)
 Anesthesia Evaluation  Patient identified by MRN, date of birth, ID band Patient awake    Reviewed: Allergy & Precautions, NPO status , Patient's Chart, lab work & pertinent test results  History of Anesthesia Complications Negative for: history of anesthetic complications  Airway Mallampati: III  TM Distance: >3 FB Neck ROM: Full    Dental no notable dental hx.    Pulmonary neg pulmonary ROS   Pulmonary exam normal        Cardiovascular hypertension, Pt. on medications Normal cardiovascular exam     Neuro/Psych    Depression    TIA (2000)   GI/Hepatic negative GI ROS, Neg liver ROS,,,  Endo/Other  negative endocrine ROS    Renal/GU negative Renal ROS     Musculoskeletal negative musculoskeletal ROS (+)    Abdominal   Peds  Hematology negative hematology ROS (+)   Anesthesia Other Findings   Reproductive/Obstetrics                              Anesthesia Physical Anesthesia Plan  ASA: 2  Anesthesia Plan: General   Post-op Pain Management: Tylenol  PO (pre-op)*   Induction: Intravenous  PONV Risk Score and Plan: Treatment may vary due to age or medical condition, Ondansetron , Dexamethasone  and Midazolam   Airway Management Planned: LMA  Additional Equipment: None  Intra-op Plan:   Post-operative Plan: Extubation in OR  Informed Consent: I have reviewed the patients History and Physical, chart, labs and discussed the procedure including the risks, benefits and alternatives for the proposed anesthesia with the patient or authorized representative who has indicated his/her understanding and acceptance.     Dental advisory given  Plan Discussed with: CRNA  Anesthesia Plan Comments:          Anesthesia Quick Evaluation

## 2024-07-13 NOTE — Discharge Instructions (Signed)
 No Tylenol  or NSAIDs (Motrin/Ibuprofen) until after 1:00pm today, if needed.   Post Anesthesia Home Care Instructions  Activity: Get plenty of rest for the remainder of the day. A responsible individual must stay with you for 24 hours following the procedure.  For the next 24 hours, DO NOT: -Drive a car -Advertising copywriter -Drink alcoholic beverages -Take any medication unless instructed by your physician -Make any legal decisions or sign important papers.  Meals: Start with liquid foods such as gelatin or soup. Progress to regular foods as tolerated. Avoid greasy, spicy, heavy foods. If nausea and/or vomiting occur, drink only clear liquids until the nausea and/or vomiting subsides. Call your physician if vomiting continues.  Special Instructions/Symptoms: Your throat may feel dry or sore from the anesthesia or the breathing tube placed in your throat during surgery. If this causes discomfort, gargle with warm salt water. The discomfort should disappear within 24 hours.  If you had a scopolamine  patch placed behind your ear for the management of post- operative nausea and/or vomiting:  1. The medication in the patch is effective for 72 hours, after which it should be removed.  Wrap patch in a tissue and discard in the trash. Wash hands thoroughly with soap and water. 2. You may remove the patch earlier than 72 hours if you experience unpleasant side effects which may include dry mouth, dizziness or visual disturbances. 3. Avoid touching the patch. Wash your hands with soap and water after contact with the patch.

## 2024-07-13 NOTE — Transfer of Care (Signed)
 Immediate Anesthesia Transfer of Care Note  Patient: Kelsey Mclaughlin  Procedure(s) Performed: RIGHT NIPPLE AREOLA RECONSTRUCTION WITH LOCAL FLAP (Right: Breast) FULL-THICKNESS SKIN GRAFT FROM RIGHT GROIN (Right: Groin)  Patient Location: PACU  Anesthesia Type:General  Level of Consciousness: awake  Airway & Oxygen Therapy: Patient Spontanous Breathing and Patient connected to face mask oxygen  Post-op Assessment: Report given to RN and Post -op Vital signs reviewed and stable  Post vital signs: Reviewed and stable  Last Vitals:  Vitals Value Taken Time  BP 149/73 07/13/24 09:18  Temp    Pulse 70 07/13/24 09:21  Resp 10 07/13/24 09:21  SpO2 100 % 07/13/24 09:21  Vitals shown include unfiled device data.  Last Pain:  Vitals:   07/13/24 0655  TempSrc: Temporal  PainSc: 0-No pain         Complications: No notable events documented.

## 2024-07-13 NOTE — Op Note (Signed)
 Operative Note   DATE OF OPERATION: 10.31.2025  LOCATION: [facility]-outpatient  SURGICAL DIVISION: Plastic Surgery  PREOPERATIVE DIAGNOSES:  1. History breast cancer 2. Acquired absence breasts   POSTOPERATIVE DIAGNOSES:  same  PROCEDURE:  1. Right nipple reconstruction with local flap 2. Full thickness skin graft from right groin to chest 14 cm2  SURGEON: Earlis Ranks MD MBA  ASSISTANT: none  ANESTHESIA:  General.   EBL: 5 ml  COMPLICATIONS: None immediate.   INDICATIONS FOR PROCEDURE:  The patient, Kelsey Mclaughlin, is a 63 y.o. female born on [birth year], is here for right nipple areola reconstruction following mastectomy and implant based reconstruction.    FINDINGS: Local flap right chest completed.  DESCRIPTION OF PROCEDURE:  The patient's operative site was marked with the patient in the preoperative area. Location for right nipple placement marked symmetric with left. The patient was taken to the operating room. SCDs were placed and IV antibiotics were given. The patient's operative site was prepped and draped in a sterile fashion. A time out was performed and all information was confirmed to be correct. A modified star flap, based medially, was designed over right chest. Cookie cutter used to mark area for areola symmetric with left reconstruction. The flaps were sharply incised and elevated in subcutaneous place. The lateral flaps were approximated in midline with 4-0 vicryl in dermis. The superior flap also inset and skin brought together with interrupted 4-0 vicryl rapide. The remainder of marked area for areola was deepithelialized. Full thickness skin graft harvested from right groin and donor site closed with 3-0 vicryl in dermis and 3-0 monocryl subcuticular skin closure. Steri strips applied. The skin was prepared to remove all subcutaneous fat and hair follicles. The graft was inset over left chest with 4-0 vicryl rapide. Graft perforated.. Bolster dressing of  adaptic and sterile sponge secured with 4-0 silk.     Dry dressing and Medipore tape applied to right groin.  The patient was allowed to wake from anesthesia, extubated and taken to the recovery room in satisfactory condition.   SPECIMENS: none  DRAINS: none  Earlis Ranks, MD [facility]astic [facility]construct[facility] Surgery  Office/ physician access line after hours [phone]

## 2024-07-14 ENCOUNTER — Encounter (HOSPITAL_BASED_OUTPATIENT_CLINIC_OR_DEPARTMENT_OTHER): Payer: Self-pay | Admitting: Plastic Surgery

## 2024-07-19 DIAGNOSIS — Z9013 Acquired absence of bilateral breasts and nipples: Secondary | ICD-10-CM | POA: Diagnosis not present

## 2024-07-19 DIAGNOSIS — Z923 Personal history of irradiation: Secondary | ICD-10-CM | POA: Diagnosis not present

## 2024-07-19 DIAGNOSIS — Z853 Personal history of malignant neoplasm of breast: Secondary | ICD-10-CM | POA: Diagnosis not present

## 2024-07-20 DIAGNOSIS — F320 Major depressive disorder, single episode, mild: Secondary | ICD-10-CM | POA: Diagnosis not present

## 2024-07-20 DIAGNOSIS — Z01818 Encounter for other preprocedural examination: Secondary | ICD-10-CM | POA: Diagnosis not present

## 2024-07-20 DIAGNOSIS — C50911 Malignant neoplasm of unspecified site of right female breast: Secondary | ICD-10-CM | POA: Diagnosis not present

## 2024-07-20 DIAGNOSIS — I890 Lymphedema, not elsewhere classified: Secondary | ICD-10-CM | POA: Diagnosis not present

## 2024-07-20 DIAGNOSIS — I10 Essential (primary) hypertension: Secondary | ICD-10-CM | POA: Diagnosis not present

## 2024-08-06 DIAGNOSIS — Z124 Encounter for screening for malignant neoplasm of cervix: Secondary | ICD-10-CM | POA: Diagnosis not present

## 2024-08-06 DIAGNOSIS — Z923 Personal history of irradiation: Secondary | ICD-10-CM | POA: Diagnosis not present

## 2024-08-06 DIAGNOSIS — Z01419 Encounter for gynecological examination (general) (routine) without abnormal findings: Secondary | ICD-10-CM | POA: Diagnosis not present

## 2024-08-06 DIAGNOSIS — Z853 Personal history of malignant neoplasm of breast: Secondary | ICD-10-CM | POA: Diagnosis not present

## 2024-08-06 DIAGNOSIS — Z9013 Acquired absence of bilateral breasts and nipples: Secondary | ICD-10-CM | POA: Diagnosis not present

## 2024-08-06 DIAGNOSIS — N898 Other specified noninflammatory disorders of vagina: Secondary | ICD-10-CM | POA: Diagnosis not present

## 2024-08-18 DIAGNOSIS — F331 Major depressive disorder, recurrent, moderate: Secondary | ICD-10-CM | POA: Diagnosis not present

## 2025-01-08 ENCOUNTER — Inpatient Hospital Stay

## 2025-01-08 ENCOUNTER — Inpatient Hospital Stay: Admitting: Hematology and Oncology
